# Patient Record
Sex: Female | Born: 1955 | Race: Black or African American | Hispanic: No | Smoking: Former smoker
Health system: Southern US, Community
[De-identification: ages and names within clinical notes are randomized; demographics above are authoritative.]

## PROBLEM LIST (undated history)

## (undated) DIAGNOSIS — A0472 Enterocolitis due to Clostridium difficile, not specified as recurrent: Secondary | ICD-10-CM

## (undated) DIAGNOSIS — I951 Orthostatic hypotension: Secondary | ICD-10-CM

## (undated) DIAGNOSIS — I340 Nonrheumatic mitral (valve) insufficiency: Secondary | ICD-10-CM

## (undated) DIAGNOSIS — Z86718 Personal history of other venous thrombosis and embolism: Secondary | ICD-10-CM

## (undated) DIAGNOSIS — R05 Cough: Secondary | ICD-10-CM

## (undated) DIAGNOSIS — G629 Polyneuropathy, unspecified: Secondary | ICD-10-CM

## (undated) DIAGNOSIS — Z853 Personal history of malignant neoplasm of breast: Secondary | ICD-10-CM

## (undated) DIAGNOSIS — N200 Calculus of kidney: Secondary | ICD-10-CM

## (undated) DIAGNOSIS — D638 Anemia in other chronic diseases classified elsewhere: Secondary | ICD-10-CM

## (undated) DIAGNOSIS — C801 Malignant (primary) neoplasm, unspecified: Secondary | ICD-10-CM

## (undated) DIAGNOSIS — E46 Unspecified protein-calorie malnutrition: Secondary | ICD-10-CM

## (undated) DIAGNOSIS — M62838 Other muscle spasm: Secondary | ICD-10-CM

## (undated) DIAGNOSIS — F329 Major depressive disorder, single episode, unspecified: Secondary | ICD-10-CM

## (undated) DIAGNOSIS — I428 Other cardiomyopathies: Secondary | ICD-10-CM

## (undated) DIAGNOSIS — F32A Depression, unspecified: Secondary | ICD-10-CM

## (undated) DIAGNOSIS — R053 Chronic cough: Secondary | ICD-10-CM

## (undated) DIAGNOSIS — Z9884 Bariatric surgery status: Secondary | ICD-10-CM

## (undated) DIAGNOSIS — M797 Fibromyalgia: Secondary | ICD-10-CM

## (undated) DIAGNOSIS — I480 Paroxysmal atrial fibrillation: Secondary | ICD-10-CM

## (undated) DIAGNOSIS — I5022 Chronic systolic (congestive) heart failure: Secondary | ICD-10-CM

## (undated) HISTORY — DX: Fibromyalgia: M79.7

## (undated) HISTORY — DX: Bariatric surgery status: Z98.84

## (undated) HISTORY — DX: Orthostatic hypotension: I95.1

## (undated) HISTORY — DX: Depression, unspecified: F32.A

## (undated) HISTORY — DX: Other cardiomyopathies: I42.8

## (undated) HISTORY — DX: Other muscle spasm: M62.838

## (undated) HISTORY — PX: BREAST LUMPECTOMY: SHX2

## (undated) HISTORY — DX: Chronic cough: R05.3

## (undated) HISTORY — DX: Enterocolitis due to Clostridium difficile, not specified as recurrent: A04.72

## (undated) HISTORY — DX: Nonrheumatic mitral (valve) insufficiency: I34.0

## (undated) HISTORY — DX: Major depressive disorder, single episode, unspecified: F32.9

## (undated) HISTORY — DX: Unspecified protein-calorie malnutrition: E46

## (undated) HISTORY — DX: Personal history of other venous thrombosis and embolism: Z86.718

## (undated) HISTORY — DX: Calculus of kidney: N20.0

## (undated) HISTORY — DX: Personal history of malignant neoplasm of breast: Z85.3

## (undated) HISTORY — DX: Anemia in other chronic diseases classified elsewhere: D63.8

## (undated) HISTORY — DX: Polyneuropathy, unspecified: G62.9

## (undated) HISTORY — PX: ABDOMINAL HYSTERECTOMY: SHX81

## (undated) HISTORY — DX: Cough: R05

---

## 2011-10-08 DIAGNOSIS — A0472 Enterocolitis due to Clostridium difficile, not specified as recurrent: Secondary | ICD-10-CM

## 2011-10-08 HISTORY — DX: Enterocolitis due to Clostridium difficile, not specified as recurrent: A04.72

## 2011-10-22 ENCOUNTER — Inpatient Hospital Stay: Payer: Self-pay | Admitting: Internal Medicine

## 2011-10-22 DIAGNOSIS — I4891 Unspecified atrial fibrillation: Secondary | ICD-10-CM

## 2011-10-23 DIAGNOSIS — I059 Rheumatic mitral valve disease, unspecified: Secondary | ICD-10-CM

## 2011-11-08 DIAGNOSIS — N200 Calculus of kidney: Secondary | ICD-10-CM

## 2011-11-08 HISTORY — PX: LITHOTRIPSY: SUR834

## 2011-11-08 HISTORY — DX: Calculus of kidney: N20.0

## 2011-11-11 ENCOUNTER — Encounter: Payer: Self-pay | Admitting: Cardiovascular Disease

## 2011-11-11 ENCOUNTER — Ambulatory Visit (INDEPENDENT_AMBULATORY_CARE_PROVIDER_SITE_OTHER): Payer: Medicare Other | Admitting: Cardiovascular Disease

## 2011-11-11 DIAGNOSIS — R079 Chest pain, unspecified: Secondary | ICD-10-CM | POA: Insufficient documentation

## 2011-11-11 DIAGNOSIS — I428 Other cardiomyopathies: Secondary | ICD-10-CM

## 2011-11-11 DIAGNOSIS — I429 Cardiomyopathy, unspecified: Secondary | ICD-10-CM | POA: Insufficient documentation

## 2011-11-11 DIAGNOSIS — R55 Syncope and collapse: Secondary | ICD-10-CM | POA: Insufficient documentation

## 2011-11-11 DIAGNOSIS — I959 Hypotension, unspecified: Secondary | ICD-10-CM

## 2011-11-11 DIAGNOSIS — I4891 Unspecified atrial fibrillation: Secondary | ICD-10-CM | POA: Insufficient documentation

## 2011-11-11 MED ORDER — FLUDROCORTISONE ACETATE 0.1 MG PO TABS: 0.1000 mg | ORAL_TABLET | Freq: Every day | ORAL | Status: DC

## 2011-11-11 NOTE — Patient Instructions (Signed)
You are doing well. Stop the enalapril Continue the coreg twice a day Start the florinef one a day  Please call us if you have new issues that need to be addressed before your next appt.  Your physician wants you to follow-up in: 1 months.  You will receive a reminder letter in the mail two months in advance. If you don't receive a letter, please call our office to schedule the follow-up appointment.

## 2011-11-11 NOTE — Assessment & Plan Note (Signed)
She continues to have orthostatic hypotension, likely from previous chemotherapy though unable to exclude chronic C. Difficile infection. We have encouraged her to continue p.o. Fluids, high salt intake. We will add Florinef 0.1 mg daily. This can be titrated up slowly for low blood pressure. We will hold her enalapril and continue Coreg 3.125 mg b.i.d. Given her recent atrial fibrillation.

## 2011-11-11 NOTE — Assessment & Plan Note (Signed)
Recent episodes of syncope on New Year's Day. This is likely secondary to very low blood pressure. We'll start Florinef as above.

## 2011-11-11 NOTE — Assessment & Plan Note (Signed)
Episode of atrial fibrillation in the hospital but converted to normal sinus rhythm with medical management.  Currently maintaining normal sinus rhythm. We will continue low-dose carvedilol

## 2011-11-11 NOTE — Assessment & Plan Note (Signed)
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 We'll continue the Coreg, hold the ACE inhibitor secondary to low blood pressure. ACE Inhibitor could be restarted once blood pressure has improved on Florinef.

## 2011-11-11 NOTE — Progress Notes (Signed)
Patient ID: Lindsay [name], female    DOB: 02/21/1956, 56 y.o.   MRN: [id number]  HPI Comments: Lindsay [name] is a 56 year old woman, patient of Dr. 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 She was admitted on December 15, treated with flecainide and converted to normal sinus rhythm also treated with diltiazem infusion. She was noted to have low blood pressure and was given IV fluids. Further history details chronic orthostatic hypotension possibly secondary to her chemotherapy though she has had C. Difficile. She was treated for the C. Difficile on this admission.  Echocardiogram showed ejection fraction 40-45% with mild systolic and diastolic dysfunction, normal right ventricular systolic pressures. It was felt that her cardiomyopathy could be secondary to chemotherapy.  She was doing well but did have several additional episodes of syncope on New Year's Day. This happened while she was walking to the bathroom  She has been trying to drink more fluids. She does have chronic fatigue. She does not check her blood pressures at home. She continues to have nausea and diarrhea despite treatment for her C. Difficile.  EKG shows normal sinus rhythm with rate 75 beats per minute with diffuse T-wave abnormality in the anterior precordial leads   Outpatient Encounter Prescriptions as of 11/11/2011  Medication Sig Dispense Refill  . acetaminophen (TYLENOL ARTHRITIS PAIN) 650 MG CR tablet Take 650 mg by mouth every 8 (eight) hours as needed.        Marland Kitchen anastrozole (ARIMIDEX) 1 MG tablet Take 1 mg by mouth daily.        Marland Kitchen aspirin 81 MG tablet Take 160 mg by mouth daily.        . baclofen (LIORESAL) 10 MG tablet Take 10 mg by mouth 3 (three) times daily.        . carvedilol (COREG) 3.125 MG tablet Take 3.125 mg by mouth once a  day      . citalopram (CELEXA) 20 MG tablet Take 20 mg by mouth daily.        . cyclobenzaprine (FLEXERIL) 10 MG tablet Take 10 mg by mouth as needed.        . desloratadine (CLARINEX) 5 MG tablet Take 5 mg by mouth daily.        . ferrous sulfate 325 (65 FE) MG tablet Take 325 mg by mouth daily with breakfast.        . fluticasone (FLONASE) 50 MCG/ACT nasal spray Place 2 sprays into the nose daily.        Marland Kitchen gabapentin (NEURONTIN) 600 MG tablet Take 600 mg by mouth 3 (three) times daily.        Marland Kitchen guaiFENesin (ROBITUSSIN) 100 MG/5ML liquid Take 200 mg by mouth 3 (three) times daily as needed.        Marland Kitchen LOPERAMIDE HCL PO Take by mouth.        . loratadine (CLARITIN) 10 MG tablet Take 10 mg by mouth daily.        Marland Kitchen LORazepam (ATIVAN) 0.5 MG tablet Take 0.5 mg by mouth every 8 (eight) hours.        . metroNIDAZOLE (FLAGYL) 500 MG tablet Take 500 mg by mouth 2 (two) times daily.        . mirtazapine (REMERON) 15 MG tablet Take 15 mg by mouth at bedtime.        Marland Kitchen OLANZapine (ZYPREXA)  2.5 MG tablet Take 2.5 mg by mouth at bedtime.        Marland Kitchen omeprazole (PRILOSEC) 20 MG capsule Take 20 mg by mouth daily.        Marland Kitchen oxyCODONE-acetaminophen (PERCOCET) 5-325 MG per tablet Take 1 tablet by mouth every 6 (six) hours as needed.        . promethazine (PHENERGAN) 12.5 MG tablet Take 12.5 mg by mouth every 6 (six) hours as needed.        Marland Kitchen SIMETHICONE-80 PO Take 80 mg by mouth 4 (four) times daily.        . vitamin B-12 (CYANOCOBALAMIN) 1000 MCG tablet Take 1,000 mcg by mouth once a week.        . vitamin C (ASCORBIC ACID) 250 MG tablet Take 250 mg by mouth daily.        .  enalapril (VASOTEC) 2.5 MG tablet Take 2.5 mg by mouth daily.           Review of Systems  Constitutional: Positive for fatigue.  HENT: Negative.   Eyes: Negative.   Respiratory: Negative.   Cardiovascular: Negative.   Gastrointestinal: Positive for nausea, abdominal pain and diarrhea.  Musculoskeletal: Negative.   Skin: Negative.     Neurological: Positive for dizziness and syncope.  Hematological: Negative.   Psychiatric/Behavioral: Negative.   All other systems reviewed and are negative.    BP 93/61  Pulse 74  Ht 5\' 8"  (1.727 m)  Wt 138 lb (62.596 kg)  BMI 20.98 kg/m2 Blood pressure lying down was 108/75, rate of 80, sitting up was 73/38 with a rate of 82, standing was 66/41 with rate of 87, blood pressure after standing 5 minutes was 77/51 with heart rate of 90  Physical Exam  Nursing note and vitals reviewed. Constitutional: She is oriented to person, place, and time. She appears well-developed and well-nourished.  HENT:  Head: Normocephalic.  Nose: Nose normal.  Mouth/Throat: Oropharynx is clear and moist.  Eyes: Conjunctivae are normal. Pupils are equal, round, and reactive to light.  Neck: Normal range of motion. Neck supple. No JVD present.  Cardiovascular: Normal rate, regular rhythm, S1 normal, S2 normal, normal heart sounds and intact distal pulses.  Exam reveals no gallop and no friction rub.   No murmur heard. Pulmonary/Chest: Effort normal and breath sounds normal. No respiratory distress. She has no wheezes. She has no rales. She exhibits no tenderness.  Abdominal: Soft. Bowel sounds are normal. She exhibits no distension. There is no tenderness.  Musculoskeletal: Normal range of motion. She exhibits no edema and no tenderness.  Lymphadenopathy:    She has no cervical adenopathy.  Neurological: She is alert and oriented to person, place, and time. Coordination normal.  Skin: Skin is warm and dry. No rash noted. No erythema.  Psychiatric: She has a normal mood and affect. Her behavior is normal. Judgment and thought content normal.         Assessment and Plan

## 2011-11-15 ENCOUNTER — Emergency Department: Payer: Self-pay | Admitting: Emergency Medicine

## 2011-11-15 LAB — COMPREHENSIVE METABOLIC PANEL
Albumin: 3.4 g/dL (ref 3.4–5.0)
Anion Gap: 8 (ref 7–16)
BUN: 12 mg/dL (ref 7–18)
Bilirubin,Total: 0.3 mg/dL (ref 0.2–1.0)
Calcium, Total: 9.3 mg/dL (ref 8.5–10.1)
Chloride: 111 mmol/L — ABNORMAL HIGH (ref 98–107)
EGFR (African American): >60
Glucose: 90 mg/dL (ref 65–99)
Osmolality: 290 (ref 275–301)
Potassium: 3.8 mmol/L (ref 3.5–5.1)
Sodium: 146 mmol/L — ABNORMAL HIGH (ref 136–145)
Total Protein: 7 g/dL (ref 6.4–8.2)

## 2011-11-15 LAB — CBC
HCT: 37 % (ref 35.0–47.0)
HGB: 12 g/dL (ref 12.0–16.0)
MCH: 29.6 pg (ref 26.0–34.0)
MCHC: 32.3 g/dL (ref 32.0–36.0)
RBC: 4.04 10*6/uL (ref 3.80–5.20)
WBC: 6.9 10*3/uL (ref 3.6–11.0)

## 2011-11-15 LAB — TROPONIN I: Troponin-I: <0.02 ng/mL

## 2011-12-02 LAB — CBC
HCT: 32.2 % — ABNORMAL LOW (ref 35.0–47.0)
HGB: 10.5 g/dL — ABNORMAL LOW (ref 12.0–16.0)
MCHC: 32.7 g/dL (ref 32.0–36.0)
Platelet: 203 10*3/uL (ref 150–440)

## 2011-12-02 LAB — COMPREHENSIVE METABOLIC PANEL
Alkaline Phosphatase: 64 U/L (ref 50–136)
BUN: 10 mg/dL (ref 7–18)
Bilirubin,Total: 0.2 mg/dL (ref 0.2–1.0)
Co2: 27 mmol/L (ref 21–32)
Creatinine: 0.82 mg/dL (ref 0.60–1.30)
EGFR (African American): >60
EGFR (Non-African Amer.): >60
Osmolality: 289 (ref 275–301)
Potassium: 3.2 mmol/L — ABNORMAL LOW (ref 3.5–5.1)
SGOT(AST): 13 U/L — ABNORMAL LOW (ref 15–37)
SGPT (ALT): 8 U/L — ABNORMAL LOW
Sodium: 146 mmol/L — ABNORMAL HIGH (ref 136–145)
Total Protein: 5.9 g/dL — ABNORMAL LOW (ref 6.4–8.2)

## 2011-12-02 LAB — URINALYSIS, COMPLETE
Bilirubin,UR: NEGATIVE
Glucose,UR: NEGATIVE mg/dL (ref 0–75)
Ketone: NEGATIVE
RBC,UR: 43 /HPF (ref 0–5)
Squamous Epithelial: <1

## 2011-12-02 LAB — TROPONIN I: Troponin-I: 0.02 ng/mL

## 2011-12-03 LAB — BASIC METABOLIC PANEL
Anion Gap: 10 (ref 7–16)
Calcium, Total: 7.7 mg/dL — ABNORMAL LOW (ref 8.5–10.1)
Chloride: 114 mmol/L — ABNORMAL HIGH (ref 98–107)
Co2: 25 mmol/L (ref 21–32)
EGFR (African American): >60
EGFR (Non-African Amer.): >60
Osmolality: 294 (ref 275–301)
Sodium: 149 mmol/L — ABNORMAL HIGH (ref 136–145)

## 2011-12-03 LAB — MAGNESIUM: Magnesium: 1.6 mg/dL — ABNORMAL LOW

## 2011-12-03 LAB — WBCS, STOOL

## 2011-12-04 ENCOUNTER — Inpatient Hospital Stay: Payer: Self-pay | Admitting: Internal Medicine

## 2011-12-04 LAB — CBC WITH DIFFERENTIAL/PLATELET
Basophil #: 0 10*3/uL (ref 0.0–0.1)
Basophil %: 0.1 %
HCT: 30.9 % — ABNORMAL LOW (ref 35.0–47.0)
HGB: 9.9 g/dL — ABNORMAL LOW (ref 12.0–16.0)
Lymphocyte #: 1.9 10*3/uL (ref 1.0–3.6)
Lymphocyte %: 31.8 %
MCV: 91 fL (ref 80–100)
Monocyte %: 7.7 %
Neutrophil #: 3.6 10*3/uL (ref 1.4–6.5)
Neutrophil %: 59 %
RBC: 3.39 10*6/uL — ABNORMAL LOW (ref 3.80–5.20)
RDW: 14.4 % (ref 11.5–14.5)

## 2011-12-04 LAB — BASIC METABOLIC PANEL
BUN: 6 mg/dL — ABNORMAL LOW (ref 7–18)
Calcium, Total: 8.2 mg/dL — ABNORMAL LOW (ref 8.5–10.1)
Co2: 22 mmol/L (ref 21–32)
EGFR (African American): >60
Glucose: 112 mg/dL — ABNORMAL HIGH (ref 65–99)
Osmolality: 293 (ref 275–301)
Potassium: 3.6 mmol/L (ref 3.5–5.1)
Sodium: 148 mmol/L — ABNORMAL HIGH (ref 136–145)

## 2011-12-05 ENCOUNTER — Encounter: Payer: Self-pay | Admitting: Cardiovascular Disease

## 2011-12-05 LAB — BASIC METABOLIC PANEL
Calcium, Total: 8.5 mg/dL (ref 8.5–10.1)
Co2: 24 mmol/L (ref 21–32)
Creatinine: 0.86 mg/dL (ref 0.60–1.30)
EGFR (African American): >60
EGFR (Non-African Amer.): >60
Glucose: 127 mg/dL — ABNORMAL HIGH (ref 65–99)
Osmolality: 289 (ref 275–301)
Potassium: 4 mmol/L (ref 3.5–5.1)

## 2011-12-05 LAB — CBC WITH DIFFERENTIAL/PLATELET
Basophil #: 0 10*3/uL (ref 0.0–0.1)
Eosinophil #: 0.1 10*3/uL (ref 0.0–0.7)
Eosinophil %: 1.1 %
HCT: 30.7 % — ABNORMAL LOW (ref 35.0–47.0)
HGB: 10.1 g/dL — ABNORMAL LOW (ref 12.0–16.0)
Lymphocyte %: 31.5 %
MCH: 29.9 pg (ref 26.0–34.0)
MCV: 91 fL (ref 80–100)
Monocyte %: 8.1 %
Neutrophil #: 3.4 10*3/uL (ref 1.4–6.5)
Neutrophil %: 59.2 %
Platelet: 184 10*3/uL (ref 150–440)
RBC: 3.39 10*6/uL — ABNORMAL LOW (ref 3.80–5.20)
RDW: 14.2 % (ref 11.5–14.5)

## 2011-12-05 LAB — STOOL CULTURE

## 2011-12-06 LAB — BASIC METABOLIC PANEL
Anion Gap: 10 (ref 7–16)
BUN: 3 mg/dL — ABNORMAL LOW (ref 7–18)
Calcium, Total: 8.6 mg/dL (ref 8.5–10.1)
Chloride: 105 mmol/L (ref 98–107)
Co2: 25 mmol/L (ref 21–32)
EGFR (Non-African Amer.): >60
Potassium: 4.1 mmol/L (ref 3.5–5.1)
Sodium: 140 mmol/L (ref 136–145)

## 2011-12-07 LAB — BASIC METABOLIC PANEL
Calcium, Total: 8.7 mg/dL (ref 8.5–10.1)
Co2: 26 mmol/L (ref 21–32)
EGFR (African American): >60
Glucose: 94 mg/dL (ref 65–99)
Osmolality: 278 (ref 275–301)
Potassium: 4.2 mmol/L (ref 3.5–5.1)
Sodium: 141 mmol/L (ref 136–145)

## 2011-12-08 ENCOUNTER — Encounter: Payer: Self-pay | Admitting: Cardiovascular Disease

## 2011-12-08 LAB — CBC WITH DIFFERENTIAL/PLATELET
Basophil %: 0.1 %
Eosinophil %: 1.5 %
HCT: 31.1 % — ABNORMAL LOW (ref 35.0–47.0)
Lymphocyte #: 1.7 10*3/uL (ref 1.0–3.6)
Lymphocyte %: 30.6 %
MCV: 91 fL (ref 80–100)
Monocyte %: 6.3 %
Neutrophil #: 3.3 10*3/uL (ref 1.4–6.5)
Platelet: 203 10*3/uL (ref 150–440)
RBC: 3.41 10*6/uL — ABNORMAL LOW (ref 3.80–5.20)
WBC: 5.4 10*3/uL (ref 3.6–11.0)

## 2011-12-08 LAB — CLOSTRIDIUM DIFFICILE BY PCR

## 2011-12-09 ENCOUNTER — Encounter: Payer: Self-pay | Admitting: Cardiovascular Disease

## 2011-12-09 LAB — BASIC METABOLIC PANEL
Anion Gap: 10 (ref 7–16)
BUN: 8 mg/dL (ref 7–18)
Calcium, Total: 8.4 mg/dL — ABNORMAL LOW (ref 8.5–10.1)
Chloride: 108 mmol/L — ABNORMAL HIGH (ref 98–107)
Co2: 27 mmol/L (ref 21–32)
EGFR (African American): >60
EGFR (Non-African Amer.): >60
Glucose: 80 mg/dL (ref 65–99)
Osmolality: 286 (ref 275–301)
Potassium: 3.8 mmol/L (ref 3.5–5.1)

## 2011-12-12 ENCOUNTER — Ambulatory Visit (INDEPENDENT_AMBULATORY_CARE_PROVIDER_SITE_OTHER): Payer: Medicare Other | Admitting: Cardiovascular Disease

## 2011-12-12 ENCOUNTER — Encounter: Payer: Self-pay | Admitting: Cardiovascular Disease

## 2011-12-12 DIAGNOSIS — A0472 Enterocolitis due to Clostridium difficile, not specified as recurrent: Secondary | ICD-10-CM

## 2011-12-12 DIAGNOSIS — I4891 Unspecified atrial fibrillation: Secondary | ICD-10-CM

## 2011-12-12 DIAGNOSIS — I428 Other cardiomyopathies: Secondary | ICD-10-CM

## 2011-12-12 DIAGNOSIS — I959 Hypotension, unspecified: Secondary | ICD-10-CM

## 2011-12-12 NOTE — Assessment & Plan Note (Signed)
Mildly depressed EF in the past.  We will continue coreg. Restart ACE when her BP improves.

## 2011-12-12 NOTE — Progress Notes (Signed)
Patient ID: Lindsay [name], female    DOB: 03/01/1956, 56 y.o.   MRN: [id number]  HPI Comments: Lindsay [name] is a 56 year old woman, patient of Dr. 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 She was admitted on October 22 2011, treated with flecainide and converted to normal sinus rhythm also treated with diltiazem infusion. She was noted to have low blood pressure and was given IV fluids. Diarrhea x 2 years. She has chronic orthostatic hypotension possibly secondary to her chemotherapy though she has had C. Difficile. She was treated for the C. Difficile in 10/2011 with flagyl.  Echocardiogram showed ejection fraction 40-45% with mild systolic and diastolic dysfunction, normal right ventricular systolic pressures. It was felt that her cardiomyopathy could be secondary to chemotherapy.  She was doing well but did have several additional episodes of syncope on New Year's Day. This happened while she was walking to the bathroom We saw her in clinic and held her ACE inhibitor, started Florinef 0.1 mg daily. She has not had any syncope since then though was admitted to the hospital a week ago with near syncope, hypotension, kidney stones, requiring removal of a kidney stone, started on vancomycin for C. Difficile colitis.  She reports that she continues to have diarrhea and has difficulty maintaining enough po fluid intake.       Outpatient Encounter Prescriptions as of 12/12/2011  Medication Sig Dispense Refill  . acetaminophen (TYLENOL ARTHRITIS PAIN) 650 MG CR tablet Take 650 mg by mouth every 8 (eight) hours as needed.        Marland Kitchen anastrozole (ARIMIDEX) 1 MG tablet Take 1 mg by mouth daily.        Marland Kitchen aspirin 81 MG tablet Take 81 mg by mouth  daily.       Marland Kitchen azelastine (ASTELIN) 137 MCG/SPRAY nasal spray Place 1 spray into the nose 2 (two) times daily. Use in each nostril as directed      . baclofen (LIORESAL) 10 MG tablet Take 5 mg by mouth 3 (three) times daily.       . carvedilol (COREG) 3.125 MG tablet Take 3.125 mg by mouth 2 (two) times daily with a meal.       . citalopram (CELEXA) 20 MG tablet Take 20 mg by mouth daily.        . cyclobenzaprine (FLEXERIL) 10 MG tablet Take 10 mg by mouth as needed.        . desloratadine (CLARINEX) 5 MG tablet Take 5 mg by mouth daily.        . ferrous sulfate 325 (65 FE) MG tablet Take 325 mg by mouth daily with breakfast.        . Flora-Q (FLORA-Q) CAPS Take 1 capsule by mouth 2 (two) times daily.      . fludrocortisone (FLORINEF) 0.1 MG tablet Take 1 tablet (0.1 mg total) by mouth daily.  30 tablet  6  . fluticasone (FLONASE) 50 MCG/ACT nasal spray Place 2 sprays into the nose daily.        Marland Kitchen gabapentin (NEURONTIN) 600 MG tablet Take 600 mg by mouth 3 (three) times daily.        Marland Kitchen guaiFENesin (ROBITUSSIN) 100 MG/5ML liquid Take 200 mg by mouth 3 (three) times daily as needed.        Marland Kitchen  loratadine (CLARITIN) 10 MG tablet Take 10 mg by mouth daily.        Marland Kitchen LORazepam (ATIVAN) 0.5 MG tablet Take 0.5 mg by mouth every 8 (eight) hours.        . Meth-Hyo-M Bl-Na Phos-Ph Sal (URIBEL PO) Take 1 capsule by mouth as needed.      . mirtazapine (REMERON) 15 MG tablet Take 15 mg by mouth at bedtime.        Marland Kitchen OLANZapine (ZYPREXA) 2.5 MG tablet Take 2.5 mg by mouth at bedtime.        Marland Kitchen omeprazole (PRILOSEC) 20 MG capsule Take 20 mg by mouth daily.        Marland Kitchen oxyCODONE-acetaminophen (PERCOCET) 5-325 MG per tablet Take 1 tablet by mouth every 6 (six) hours as needed.        . promethazine (PHENERGAN) 12.5 MG tablet Take 12.5 mg by mouth every 6 (six) hours as needed.        Marland Kitchen SIMETHICONE-80 PO Take 80 mg by mouth 3 (three) times daily as needed.       . vancomycin (VANCOCIN) 125 MG capsule Take 125 mg by  mouth 4 (four) times daily.      . vitamin B-12 (CYANOCOBALAMIN) 1000 MCG tablet Take 1,000 mcg by mouth once a week.        . vitamin C (ASCORBIC ACID) 250 MG tablet Take 250 mg by mouth daily.        Marland Kitchen LOPERAMIDE HCL PO Take by mouth.        . metroNIDAZOLE (FLAGYL) 500 MG tablet Take 500 mg by mouth 2 (two) times daily.            Review of Systems  Constitutional: Positive for fatigue.  HENT: Negative.   Eyes: Negative.   Respiratory: Negative.   Cardiovascular: Negative.   Gastrointestinal: Positive for diarrhea.  Musculoskeletal: Negative.   Skin: Negative.   Neurological: Positive for dizziness and light-headedness.  Hematological: Negative.   Psychiatric/Behavioral: Negative.   All other systems reviewed and are negative.    BP 93/67  Pulse 86  Ht 5\' 8"  (1.727 m)  Wt 132 lb (59.875 kg)  BMI 20.07 kg/m2  Physical Exam  Nursing note and vitals reviewed. Constitutional: She is oriented to person, place, and time.       Very thin. In electric scooter  HENT:  Head: Normocephalic.  Nose: Nose normal.  Mouth/Throat: Oropharynx is clear and moist.  Eyes: Conjunctivae are normal. Pupils are equal, round, and reactive to light.  Neck: Normal range of motion. Neck supple. No JVD present.  Cardiovascular: Normal rate, regular rhythm, S1 normal, S2 normal, normal heart sounds and intact distal pulses.  Exam reveals no gallop and no friction rub.   No murmur heard. Pulmonary/Chest: Effort normal and breath sounds normal. No respiratory distress. She has no wheezes. She has no rales. She exhibits no tenderness.  Abdominal: Soft. Bowel sounds are normal. She exhibits no distension. There is no tenderness.  Musculoskeletal: Normal range of motion. She exhibits no edema and no tenderness.  Lymphadenopathy:    She has no cervical adenopathy.  Neurological: She is alert and oriented to person, place, and time. Coordination normal.  Skin: Skin is warm and dry. No rash noted. No  erythema.  Psychiatric: She has a normal mood and affect. Her behavior is normal. Judgment and thought content normal.         Assessment and Plan

## 2011-12-12 NOTE — Assessment & Plan Note (Signed)
NSR on exam today. Continue coreg.

## 2011-12-12 NOTE — Patient Instructions (Signed)
You are doing well. No medication changes were made. Hold the enalapril at home  Please call us if you have new issues that need to be addressed before your next appt.  Your physician wants you to follow-up in: 1 months.  You will receive a reminder letter in the mail two months in advance. If you don't receive a letter, please call our office to schedule the follow-up appointment.

## 2011-12-12 NOTE — Assessment & Plan Note (Signed)
We'll continue her on Florinef, hold her ACE inhibitor.  We'll continue the carvedilol for now given history of atrial fibrillation. She is on vancomycin and if this helps her colitis, this should help improve her fluid hydration.

## 2011-12-12 NOTE — Assessment & Plan Note (Signed)
She is being treated with vancomycin for 2 weeks. Followup with Dr. Bluford Kaufmann.

## 2012-01-09 ENCOUNTER — Encounter: Payer: Self-pay | Admitting: Cardiovascular Disease

## 2012-01-09 ENCOUNTER — Ambulatory Visit (INDEPENDENT_AMBULATORY_CARE_PROVIDER_SITE_OTHER): Payer: Medicare Other | Admitting: Cardiovascular Disease

## 2012-01-09 DIAGNOSIS — R55 Syncope and collapse: Secondary | ICD-10-CM

## 2012-01-09 DIAGNOSIS — I428 Other cardiomyopathies: Secondary | ICD-10-CM

## 2012-01-09 DIAGNOSIS — I959 Hypotension, unspecified: Secondary | ICD-10-CM

## 2012-01-09 DIAGNOSIS — I4891 Unspecified atrial fibrillation: Secondary | ICD-10-CM

## 2012-01-09 DIAGNOSIS — A0472 Enterocolitis due to Clostridium difficile, not specified as recurrent: Secondary | ICD-10-CM

## 2012-01-09 NOTE — Assessment & Plan Note (Signed)
Blood pressure continues to run low. Currently asymptomatic. Encouraged high salt and fluid intake. If she has more symptoms, Florinef could be increased. TED hose if she has more symptoms.

## 2012-01-09 NOTE — Assessment & Plan Note (Signed)
Diarrhea seems to have improved after vancomycin. This will likely help her Hydration status.

## 2012-01-09 NOTE — Assessment & Plan Note (Signed)
Previous echocardiogram showing depressed ejection fraction, possibly secondary to chemotherapy. Unable to advance her medication management secondary to low blood pressure.

## 2012-01-09 NOTE — Assessment & Plan Note (Signed)
Maintaining normal sinus rhythm today. No changes to her medications. 

## 2012-01-09 NOTE — Progress Notes (Signed)
Patient ID: Lindsay [name], female    DOB: [birth year], 56 y.o.   MRN: [id number]  HPI Comments: Lindsay [name] is a 56 year old woman, patient of Dr. 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 She was admitted on October 22 2011, treated with flecainide and converted to normal sinus rhythm also treated with diltiazem infusion. She was noted to have low blood pressure and was given IV fluids. Diarrhea x 2 years. She has chronic orthostatic hypotension possibly secondary to her chemotherapy though she has had C. Difficile. She was treated for the C. Difficile in 10/2011 with flagyl.  She was doing well but did have several additional episodes of syncope on New Year's Day. This happened while she was walking to the bathroom We saw her in clinic and held her ACE inhibitor, started Florinef 0.1 mg daily. She has not had any syncope since then though was admitted to the hospital  with near syncope, hypotension, kidney stones, requiring removal of a kidney stone, started on vancomycin for C. Difficile colitis.  Today she reports that she is doing well. She is on Florinef 0.1 mg daily, diarrhea is improved, no significant lightheadedness or dizziness. No syncope. She is starting to walk more with her walker but continues to use a wheelchair. Overall she feels well.   Echocardiogram showed ejection fraction 40-45% with mild systolic and diastolic dysfunction, normal right ventricular systolic pressures. It was felt that her cardiomyopathy could be secondary to chemotherapy.     Outpatient Encounter Prescriptions as of 01/09/2012  Medication Sig Dispense Refill  . acetaminophen (TYLENOL ARTHRITIS PAIN) 650 MG CR tablet Take 650 mg by mouth every 8  (eight) hours as needed.        Marland Kitchen anastrozole (ARIMIDEX) 1 MG tablet Take 1 mg by mouth daily.        Marland Kitchen aspirin 81 MG tablet Take 81 mg by mouth daily.       Marland Kitchen azelastine (ASTELIN) 137 MCG/SPRAY nasal spray Place 1 spray into the nose 2 (two) times daily. Use in each nostril as directed      . baclofen (LIORESAL) 10 MG tablet Take 5 mg by mouth 3 (three) times daily.       . carvedilol (COREG) 3.125 MG tablet Take 3.125 mg by mouth 2 (two) times daily with a meal.       . citalopram (CELEXA) 20 MG tablet Take 20 mg by mouth daily.        . cyclobenzaprine (FLEXERIL) 10 MG tablet Take 10 mg by mouth as needed.        . desloratadine (CLARINEX) 5 MG tablet Take 5 mg by mouth daily.        . ferrous sulfate 325 (65 FE) MG tablet Take 325 mg by mouth daily with breakfast.        . fludrocortisone (FLORINEF) 0.1 MG tablet Take 1 tablet (0.1 mg total) by mouth daily.  30 tablet  6  . fluticasone (FLONASE) 50 MCG/ACT nasal spray Place 2 sprays into the nose daily.        Marland Kitchen gabapentin (NEURONTIN) 600 MG tablet Take 600 mg by mouth 3 (three) times daily.        Marland Kitchen guaiFENesin (ROBITUSSIN) 100 MG/5ML liquid Take 200 mg by mouth 3 (three)  times daily as needed.        Marland Kitchen LOPERAMIDE HCL PO Take by mouth.        . loratadine (CLARITIN) 10 MG tablet Take 10 mg by mouth daily.        Marland Kitchen LORazepam (ATIVAN) 0.5 MG tablet Take 0.5 mg by mouth every 8 (eight) hours.        . mirtazapine (REMERON) 15 MG tablet Take 15 mg by mouth at bedtime.        Marland Kitchen OLANZapine (ZYPREXA) 2.5 MG tablet Take 2.5 mg by mouth at bedtime.        Marland Kitchen omeprazole (PRILOSEC) 20 MG capsule Take 20 mg by mouth daily.        Marland Kitchen oxyCODONE-acetaminophen (PERCOCET) 5-325 MG per tablet Take 1 tablet by mouth every 6 (six) hours as needed.        . promethazine (PHENERGAN) 12.5 MG tablet Take 12.5 mg by mouth every 6 (six) hours as needed.        Marland Kitchen SIMETHICONE-80 PO Take 80 mg by mouth 3 (three) times daily as needed.       . vitamin B-12  (CYANOCOBALAMIN) 1000 MCG tablet Take 1,000 mcg by mouth once a week.        . vitamin C (ASCORBIC ACID) 250 MG tablet Take 250 mg by mouth daily.        Donald Prose (FLORA-Q) CAPS Take 1 capsule by mouth 2 (two) times daily.      . Meth-Hyo-M Bl-Na Phos-Ph Sal (URIBEL PO) Take 1 capsule by mouth as needed.      Marland Kitchen DISCONTD: metroNIDAZOLE (FLAGYL) 500 MG tablet Take 500 mg by mouth 2 (two) times daily.        Marland Kitchen DISCONTD: vancomycin (VANCOCIN) 125 MG capsule Take 125 mg by mouth 4 (four) times daily.          Review of Systems  HENT: Negative.   Eyes: Negative.   Respiratory: Negative.   Cardiovascular: Negative.   Musculoskeletal: Negative.   Skin: Negative.   Hematological: Negative.   Psychiatric/Behavioral: Negative.   All other systems reviewed and are negative.    BP 95/67  Pulse 65  Ht 5\' 9"  (1.753 m)  Wt 129 lb (58.514 kg)  BMI 19.05 kg/m2  Physical Exam  Nursing note and vitals reviewed. Constitutional: She is oriented to person, place, and time.       Very thin. In electric scooter  HENT:  Head: Normocephalic.  Nose: Nose normal.  Mouth/Throat: Oropharynx is clear and moist.  Eyes: Conjunctivae are normal. Pupils are equal, round, and reactive to light.  Neck: Normal range of motion. Neck supple. No JVD present.  Cardiovascular: Normal rate, regular rhythm, S1 normal, S2 normal, normal heart sounds and intact distal pulses.  Exam reveals no gallop and no friction rub.   No murmur heard. Pulmonary/Chest: Effort normal and breath sounds normal. No respiratory distress. She has no wheezes. She has no rales. She exhibits no tenderness.  Abdominal: Soft. Bowel sounds are normal. She exhibits no distension. There is no tenderness.  Musculoskeletal: Normal range of motion. She exhibits no edema and no tenderness.  Lymphadenopathy:    She has no cervical adenopathy.  Neurological: She is alert and oriented to person, place, and time. Coordination normal.  Skin: Skin is  warm and dry. No rash noted. No erythema.  Psychiatric: She has a normal mood and affect. Her behavior is normal. Judgment and thought content normal.  Assessment and Plan

## 2012-01-09 NOTE — Patient Instructions (Signed)
You are doing well. No medication changes were made.  Please call us if you have new issues that need to be addressed before your next appt.  Your physician wants you to follow-up in: 6 months.  You will receive a reminder letter in the mail two months in advance. If you don't receive a letter, please call our office to schedule the follow-up appointment.   

## 2012-01-09 NOTE — Assessment & Plan Note (Signed)
No further syncope. Previous episodes likely secondary to hypotension. We'll continue Florinef, encouraged fluid intake, high salt intake.

## 2012-05-01 ENCOUNTER — Encounter: Payer: Self-pay | Admitting: Cardiovascular Disease

## 2012-05-07 ENCOUNTER — Other Ambulatory Visit: Payer: Self-pay | Admitting: *Deleted

## 2012-05-07 MED ORDER — FLUDROCORTISONE ACETATE 0.1 MG PO TABS: 0.1000 mg | ORAL_TABLET | Freq: Every day | ORAL | Status: AC

## 2012-05-07 NOTE — Telephone Encounter (Signed)
Refilled Fludrocortisone acet.

## 2012-05-31 ENCOUNTER — Emergency Department: Payer: Self-pay | Admitting: Emergency Medicine

## 2012-05-31 LAB — DRUG SCREEN, URINE
Amphetamines, Ur Screen: NEGATIVE (ref ?–1000)
Barbiturates, Ur Screen: NEGATIVE (ref ?–200)
Cocaine Metabolite,Ur ~~LOC~~: NEGATIVE (ref ?–300)
Methadone, Ur Screen: NEGATIVE (ref ?–300)
Opiate, Ur Screen: POSITIVE (ref ?–300)
Tricyclic, Ur Screen: POSITIVE (ref ?–1000)

## 2012-05-31 LAB — URINALYSIS, COMPLETE
Bacteria: NONE SEEN
Bilirubin,UR: NEGATIVE
Blood: NEGATIVE
Glucose,UR: NEGATIVE mg/dL (ref 0–75)
Ketone: NEGATIVE
Ph: 5 (ref 4.5–8.0)
Protein: NEGATIVE
Specific Gravity: 1.02 (ref 1.003–1.030)

## 2012-05-31 LAB — COMPREHENSIVE METABOLIC PANEL
Albumin: 4.1 g/dL (ref 3.4–5.0)
Alkaline Phosphatase: 127 U/L (ref 50–136)
BUN: 20 mg/dL — ABNORMAL HIGH (ref 7–18)
Calcium, Total: 10 mg/dL (ref 8.5–10.1)
Glucose: 105 mg/dL — ABNORMAL HIGH (ref 65–99)
SGOT(AST): 32 U/L (ref 15–37)
Total Protein: 8.7 g/dL — ABNORMAL HIGH (ref 6.4–8.2)

## 2012-05-31 LAB — TSH: Thyroid Stimulating Horm: 5.31 u[IU]/mL — ABNORMAL HIGH

## 2012-05-31 LAB — CBC
HCT: 36.4 % (ref 35.0–47.0)
Platelet: 213 10*3/uL (ref 150–440)
RBC: 3.88 10*6/uL (ref 3.80–5.20)
RDW: 14.4 % (ref 11.5–14.5)

## 2012-05-31 LAB — ETHANOL: Ethanol %: <0.003 % (ref 0.000–0.080)

## 2012-05-31 LAB — SALICYLATE LEVEL: Salicylates, Serum: 1.9 mg/dL

## 2012-05-31 LAB — ACETAMINOPHEN LEVEL: Acetaminophen: <2 ug/mL

## 2012-07-13 ENCOUNTER — Ambulatory Visit (INDEPENDENT_AMBULATORY_CARE_PROVIDER_SITE_OTHER): Payer: Medicare Other | Admitting: Cardiovascular Disease

## 2012-07-13 ENCOUNTER — Encounter: Payer: Self-pay | Admitting: Cardiovascular Disease

## 2012-07-13 VITALS — BP 80/62 | HR 72 | Ht 66.0 in | Wt 148.0 lb

## 2012-07-13 DIAGNOSIS — R55 Syncope and collapse: Secondary | ICD-10-CM

## 2012-07-13 DIAGNOSIS — I4891 Unspecified atrial fibrillation: Secondary | ICD-10-CM

## 2012-07-13 NOTE — Assessment & Plan Note (Signed)
Maintaining normal sinus rhythm. Continue low-dose carvedilol

## 2012-07-13 NOTE — Patient Instructions (Addendum)
You are doing well. No medication changes were made.  Please call us if you have new issues that need to be addressed before your next appt.  Your physician wants you to follow-up in: 6 months.  You will receive a reminder letter in the mail two months in advance. If you don't receive a letter, please call our office to schedule the follow-up appointment.   

## 2012-07-13 NOTE — Progress Notes (Signed)
Patient ID: Lindsay [name], female    DOB: 1955/12/21, 56 y.o.   MRN: [id number]  HPI Comments: Lindsay [name] is a 56 year old woman, patient of Dr. 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 She was noted to have low blood pressure and was given IV fluids. She has chronic Diarrhea, also with chronic orthostatic hypotension possibly secondary to her chemotherapy though she has had C. Difficile. She was treated for the C. Difficile in 10/2011 with flagyl.  Syncope in early 2012 while walking. We held her ACE inhibitor and started Florinef. She reports that her Florinef was held she does not know the details and this was restarted several days ago. She denies any near-syncope or syncope recently. Her diarrhea has improved and occasionally she has a formed stool.  She continues to walk with a walker. She has been spending time at the beach with her mother. Overall she has no complaints  Echocardiogram showed ejection fraction 40-45% with mild systolic and diastolic dysfunction, normal right ventricular systolic pressures. It was felt that her cardiomyopathy could be secondary to chemotherapy.  EKG shows normal sinus rhythm with rate 72 beats per minute with no significant ST changes     Outpatient Encounter Prescriptions as of 07/13/2012  Medication Sig Dispense Refill  . acetaminophen (TYLENOL ARTHRITIS PAIN) 650 MG CR tablet Take 650 mg by mouth every 8 (eight) hours as needed.        Marland Kitchen anastrozole (ARIMIDEX) 1 MG tablet Take 1 mg by mouth daily.        Marland Kitchen aspirin 81 MG tablet Take 81 mg by mouth daily.       Marland Kitchen  azelastine (ASTELIN) 137 MCG/SPRAY nasal spray Place 1 spray into the nose 2 (two) times daily. Use in each nostril as directed      . baclofen (LIORESAL) 10 MG tablet Take 5 mg by mouth 3 (three) times daily.       . carvedilol (COREG) 3.125 MG tablet Take 3.125 mg by mouth 2 (two) times daily with a meal.       . citalopram (CELEXA) 20 MG tablet Take 20 mg by mouth daily.        . cyclobenzaprine (FLEXERIL) 10 MG tablet Take 10 mg by mouth as needed.        . desloratadine (CLARINEX) 5 MG tablet Take 5 mg by mouth daily.        . ferrous sulfate 325 (65 FE) MG tablet Take 325 mg by mouth daily with breakfast.        . Flora-Q (FLORA-Q) CAPS Take 1 capsule by mouth 2 (two) times daily.      . fludrocortisone (FLORINEF) 0.1 MG tablet Take 1 tablet (0.1 mg total) by mouth daily.  30 tablet  3  . fluticasone (FLONASE) 50 MCG/ACT nasal spray Place 2 sprays into the nose daily.        Marland Kitchen gabapentin (NEURONTIN) 600 MG tablet Take 600 mg by mouth 3 (three) times daily.        Marland Kitchen guaiFENesin (ROBITUSSIN) 100 MG/5ML liquid Take 200 mg by mouth 3 (three) times daily as needed.        Marland Kitchen  LOPERAMIDE HCL PO Take by mouth.        . loratadine (CLARITIN) 10 MG tablet Take 10 mg by mouth daily.        Marland Kitchen LORazepam (ATIVAN) 0.5 MG tablet Take 0.5 mg by mouth every 8 (eight) hours.        . Meth-Hyo-M Bl-Na Phos-Ph Sal (URIBEL PO) Take 1 capsule by mouth as needed.      . mirtazapine (REMERON) 15 MG tablet Take 15 mg by mouth at bedtime.        Marland Kitchen OLANZapine (ZYPREXA) 2.5 MG tablet Take 2.5 mg by mouth at bedtime.        Marland Kitchen omeprazole (PRILOSEC) 20 MG capsule Take 20 mg by mouth daily.        Marland Kitchen oxyCODONE-acetaminophen (PERCOCET) 5-325 MG per tablet Take 1 tablet by mouth every 6 (six) hours as needed.        . promethazine (PHENERGAN) 12.5 MG tablet Take 12.5 mg by mouth every 6 (six) hours as needed.        Marland Kitchen SIMETHICONE-80 PO Take 80 mg by mouth 3 (three) times daily as needed.       . vitamin B-12 (CYANOCOBALAMIN)  1000 MCG tablet Take 1,000 mcg by mouth once a week.        . vitamin C (ASCORBIC ACID) 250 MG tablet Take 250 mg by mouth daily.            Review of Systems  Constitutional: Negative.   HENT: Negative.   Eyes: Negative.   Respiratory: Negative.   Cardiovascular: Negative.   Gastrointestinal: Negative.   Musculoskeletal: Positive for gait problem.  Skin: Negative.   Neurological: Negative.   Hematological: Negative.   Psychiatric/Behavioral: Negative.   All other systems reviewed and are negative.    BP 80/62  Pulse 72  Ht 5\' 6"  (1.676 m)  Wt 148 lb (67.132 kg)  BMI 23.89 kg/m2  Physical Exam  Nursing note and vitals reviewed. Constitutional: She is oriented to person, place, and time. She appears well-developed and well-nourished.       Very thin. Walking with a walker  HENT:  Head: Normocephalic.  Nose: Nose normal.  Mouth/Throat: Oropharynx is clear and moist.  Eyes: Conjunctivae are normal. Pupils are equal, round, and reactive to light.  Neck: Normal range of motion. Neck supple. No JVD present.  Cardiovascular: Normal rate, regular rhythm, S1 normal, S2 normal, normal heart sounds and intact distal pulses.  Exam reveals no gallop and no friction rub.   No murmur heard. Pulmonary/Chest: Effort normal and breath sounds normal. No respiratory distress. She has no wheezes. She has no rales. She exhibits no tenderness.  Abdominal: Soft. Bowel sounds are normal. She exhibits no distension. There is no tenderness.  Musculoskeletal: Normal range of motion. She exhibits no edema and no tenderness.  Lymphadenopathy:    She has no cervical adenopathy.  Neurological: She is alert and oriented to person, place, and time. Coordination normal.  Skin: Skin is warm and dry. No rash noted. No erythema.  Psychiatric: She has a normal mood and affect. Her behavior is normal. Judgment and thought content normal.         Assessment and Plan

## 2012-07-13 NOTE — Assessment & Plan Note (Signed)
No recent syncope or near syncope. We have suggested she stay on Florinef 0.1 mg daily, encouraged fluid and salt intake.

## 2013-03-22 ENCOUNTER — Emergency Department: Payer: Self-pay | Admitting: Emergency Medicine

## 2013-05-18 ENCOUNTER — Emergency Department: Payer: Self-pay | Admitting: Emergency Medicine

## 2013-05-18 LAB — COMPREHENSIVE METABOLIC PANEL
Albumin: 3.3 g/dL — ABNORMAL LOW (ref 3.4–5.0)
Alkaline Phosphatase: 113 U/L (ref 50–136)
BUN: 11 mg/dL (ref 7–18)
Calcium, Total: 8.9 mg/dL (ref 8.5–10.1)
Chloride: 105 mmol/L (ref 98–107)
Co2: 28 mmol/L (ref 21–32)
Creatinine: 0.94 mg/dL (ref 0.60–1.30)
EGFR (African American): >60
Glucose: 88 mg/dL (ref 65–99)
SGOT(AST): 13 U/L — ABNORMAL LOW (ref 15–37)
Sodium: 141 mmol/L (ref 136–145)
Total Protein: 7.1 g/dL (ref 6.4–8.2)

## 2013-05-18 LAB — URINALYSIS, COMPLETE
Protein: NEGATIVE
Squamous Epithelial: 17
WBC UR: 19 /HPF (ref 0–5)

## 2013-05-18 LAB — MAGNESIUM: Magnesium: 1.7 mg/dL — ABNORMAL LOW

## 2013-05-18 LAB — CBC
HCT: 32.6 % — ABNORMAL LOW (ref 35.0–47.0)
HGB: 10.7 g/dL — ABNORMAL LOW (ref 12.0–16.0)
MCV: 87 fL (ref 80–100)
Platelet: 209 10*3/uL (ref 150–440)
RBC: 3.76 10*6/uL — ABNORMAL LOW (ref 3.80–5.20)
WBC: 4.9 10*3/uL (ref 3.6–11.0)

## 2013-05-18 LAB — TROPONIN I
Troponin-I: 0.03 ng/mL
Troponin-I: 0.03 ng/mL

## 2013-05-18 LAB — PRO B NATRIURETIC PEPTIDE: B-Type Natriuretic Peptide: 700 pg/mL — ABNORMAL HIGH (ref 0–125)

## 2013-05-18 LAB — LIPASE, BLOOD: Lipase: 112 U/L (ref 73–393)

## 2013-05-24 ENCOUNTER — Emergency Department: Payer: Self-pay | Admitting: Emergency Medicine

## 2013-05-24 LAB — CBC
MCHC: 32.7 g/dL (ref 32.0–36.0)
Platelet: 228 10*3/uL (ref 150–440)
RDW: 15.1 % — ABNORMAL HIGH (ref 11.5–14.5)
WBC: 5 10*3/uL (ref 3.6–11.0)

## 2013-05-24 LAB — LIPASE, BLOOD: Lipase: 56 U/L — ABNORMAL LOW (ref 73–393)

## 2013-05-24 LAB — PHOSPHORUS: Phosphorus: 2.6 mg/dL (ref 2.5–4.9)

## 2013-05-24 LAB — BASIC METABOLIC PANEL
Anion Gap: 6 — ABNORMAL LOW (ref 7–16)
BUN: 10 mg/dL (ref 7–18)
Calcium, Total: 9.1 mg/dL (ref 8.5–10.1)
Co2: 27 mmol/L (ref 21–32)
EGFR (Non-African Amer.): >60
Glucose: 84 mg/dL (ref 65–99)
Sodium: 140 mmol/L (ref 136–145)

## 2013-06-07 ENCOUNTER — Ambulatory Visit: Payer: Self-pay | Admitting: Internal Medicine

## 2013-06-15 ENCOUNTER — Inpatient Hospital Stay: Payer: Self-pay | Admitting: Internal Medicine

## 2013-06-15 LAB — COMPREHENSIVE METABOLIC PANEL
BUN: 32 mg/dL — ABNORMAL HIGH (ref 7–18)
Co2: 22 mmol/L (ref 21–32)
Creatinine: 2.31 mg/dL — ABNORMAL HIGH (ref 0.60–1.30)
Glucose: 86 mg/dL (ref 65–99)
Potassium: 3.4 mmol/L — ABNORMAL LOW (ref 3.5–5.1)
SGOT(AST): 94 U/L — ABNORMAL HIGH (ref 15–37)
SGPT (ALT): 43 U/L (ref 12–78)
Sodium: 143 mmol/L (ref 136–145)
Total Protein: 6.4 g/dL (ref 6.4–8.2)

## 2013-06-15 LAB — CBC
HCT: 37 % (ref 35.0–47.0)
HGB: 12.5 g/dL (ref 12.0–16.0)
MCH: 28.9 pg (ref 26.0–34.0)
MCHC: 33.8 g/dL (ref 32.0–36.0)

## 2013-06-15 LAB — URINALYSIS, COMPLETE
Ketone: NEGATIVE
Leukocyte Esterase: NEGATIVE
Ph: 6 (ref 4.5–8.0)
Protein: 30
RBC,UR: 1 /HPF (ref 0–5)

## 2013-06-15 LAB — CK TOTAL AND CKMB (NOT AT ARMC)
CK, Total: 64 U/L (ref 21–215)
CK-MB: <0.5 ng/mL — ABNORMAL LOW (ref 0.5–3.6)

## 2013-06-16 LAB — CBC WITH DIFFERENTIAL/PLATELET
Basophil %: 0.1 %
Eosinophil %: 0.3 %
Lymphocyte #: 0.9 10*3/uL — ABNORMAL LOW (ref 1.0–3.6)
Lymphocyte %: 12.4 %
MCH: 28.8 pg (ref 26.0–34.0)
MCV: 85 fL (ref 80–100)
Monocyte #: 0.5 x10 3/mm (ref 0.2–0.9)
Monocyte %: 7.2 %
Neutrophil #: 6 10*3/uL (ref 1.4–6.5)
Neutrophil %: 80 %
Platelet: 113 10*3/uL — ABNORMAL LOW (ref 150–440)
RBC: 3.73 10*6/uL — ABNORMAL LOW (ref 3.80–5.20)
RDW: 14.6 % — ABNORMAL HIGH (ref 11.5–14.5)

## 2013-06-16 LAB — COMPREHENSIVE METABOLIC PANEL
Albumin: 2.6 g/dL — ABNORMAL LOW (ref 3.4–5.0)
Calcium, Total: 8.7 mg/dL (ref 8.5–10.1)
Chloride: 115 mmol/L — ABNORMAL HIGH (ref 98–107)
Co2: 21 mmol/L (ref 21–32)
EGFR (African American): 31 — ABNORMAL LOW
EGFR (Non-African Amer.): 27 — ABNORMAL LOW
Osmolality: 292 (ref 275–301)
Potassium: 3.6 mmol/L (ref 3.5–5.1)
SGPT (ALT): 32 U/L (ref 12–78)
Total Protein: 5.6 g/dL — ABNORMAL LOW (ref 6.4–8.2)

## 2013-06-16 LAB — TSH: Thyroid Stimulating Horm: 1.94 u[IU]/mL

## 2013-06-17 LAB — COMPREHENSIVE METABOLIC PANEL
Albumin: 2.4 g/dL — ABNORMAL LOW (ref 3.4–5.0)
Anion Gap: 8 (ref 7–16)
BUN: 23 mg/dL — ABNORMAL HIGH (ref 7–18)
Calcium, Total: 8.7 mg/dL (ref 8.5–10.1)
Chloride: 112 mmol/L — ABNORMAL HIGH (ref 98–107)
Co2: 22 mmol/L (ref 21–32)
Creatinine: 1.9 mg/dL — ABNORMAL HIGH (ref 0.60–1.30)
EGFR (African American): 34 — ABNORMAL LOW
EGFR (Non-African Amer.): 29 — ABNORMAL LOW
Glucose: 66 mg/dL (ref 65–99)
Osmolality: 285 (ref 275–301)
Sodium: 142 mmol/L (ref 136–145)
Total Protein: 5.4 g/dL — ABNORMAL LOW (ref 6.4–8.2)

## 2013-06-18 LAB — CBC WITH DIFFERENTIAL/PLATELET
Eosinophil %: 0.5 %
HCT: 30.6 % — ABNORMAL LOW (ref 35.0–47.0)
Lymphocyte %: 24.4 %
MCH: 28.2 pg (ref 26.0–34.0)
MCHC: 33.2 g/dL (ref 32.0–36.0)
Neutrophil %: 66.3 %
Platelet: 85 10*3/uL — ABNORMAL LOW (ref 150–440)
RBC: 3.61 10*6/uL — ABNORMAL LOW (ref 3.80–5.20)
WBC: 4.5 10*3/uL (ref 3.6–11.0)

## 2013-06-18 LAB — COMPREHENSIVE METABOLIC PANEL
Alkaline Phosphatase: 106 U/L (ref 50–136)
Anion Gap: 7 (ref 7–16)
BUN: 14 mg/dL (ref 7–18)
Bilirubin,Total: 0.6 mg/dL (ref 0.2–1.0)
Chloride: 115 mmol/L — ABNORMAL HIGH (ref 98–107)
Co2: 21 mmol/L (ref 21–32)
EGFR (African American): 46 — ABNORMAL LOW
Osmolality: 284 (ref 275–301)
Sodium: 143 mmol/L (ref 136–145)
Total Protein: 5.4 g/dL — ABNORMAL LOW (ref 6.4–8.2)

## 2013-06-18 LAB — MAGNESIUM: Magnesium: 1.5 mg/dL — ABNORMAL LOW

## 2013-06-18 LAB — FIBRINOGEN: Fibrinogen: 259 mg/dL (ref 210–470)

## 2013-06-18 LAB — PROTIME-INR: Prothrombin Time: 15.8 secs — ABNORMAL HIGH (ref 11.5–14.7)

## 2013-06-18 LAB — APTT: Activated PTT: 40.2 secs — ABNORMAL HIGH (ref 23.6–35.9)

## 2013-06-18 LAB — AMYLASE: Amylase: 467 U/L — ABNORMAL HIGH (ref 25–115)

## 2013-06-20 LAB — COMPREHENSIVE METABOLIC PANEL
Anion Gap: 8 (ref 7–16)
BUN: 7 mg/dL (ref 7–18)
Chloride: 111 mmol/L — ABNORMAL HIGH (ref 98–107)
Co2: 21 mmol/L (ref 21–32)
EGFR (Non-African Amer.): 50 — ABNORMAL LOW
SGOT(AST): 50 U/L — ABNORMAL HIGH (ref 15–37)
Total Protein: 5.7 g/dL — ABNORMAL LOW (ref 6.4–8.2)

## 2013-06-20 LAB — LIPASE, BLOOD: Lipase: 890 U/L — ABNORMAL HIGH (ref 73–393)

## 2013-06-20 LAB — MAGNESIUM: Magnesium: 2.1 mg/dL

## 2013-06-21 LAB — CBC WITH DIFFERENTIAL/PLATELET
Basophil #: 0 10*3/uL (ref 0.0–0.1)
Basophil #: 0 10*3/uL (ref 0.0–0.1)
Basophil %: 0.1 %
Basophil %: 0.1 %
Eosinophil #: 0 10*3/uL (ref 0.0–0.7)
Eosinophil %: 0.3 %
HCT: 28.7 % — ABNORMAL LOW (ref 35.0–47.0)
HGB: 10.3 g/dL — ABNORMAL LOW (ref 12.0–16.0)
Lymphocyte #: 0.8 10*3/uL — ABNORMAL LOW (ref 1.0–3.6)
Lymphocyte %: 28.1 %
MCH: 28 pg (ref 26.0–34.0)
MCHC: 32.8 g/dL (ref 32.0–36.0)
MCV: 85 fL (ref 80–100)
Monocyte #: 0.4 x10 3/mm (ref 0.2–0.9)
Monocyte %: 8.5 %
Monocyte %: 9 %
Neutrophil #: 2.7 10*3/uL (ref 1.4–6.5)
Neutrophil #: 3.1 10*3/uL (ref 1.4–6.5)
Neutrophil %: 62.8 %
RBC: 3.68 10*6/uL — ABNORMAL LOW (ref 3.80–5.20)
RBC: 3.39 10*6/uL — ABNORMAL LOW (ref 3.80–5.20)
WBC: 4.3 10*3/uL (ref 3.6–11.0)

## 2013-06-21 LAB — BASIC METABOLIC PANEL
Anion Gap: 8 (ref 7–16)
BUN: 5 mg/dL — ABNORMAL LOW (ref 7–18)
Calcium, Total: 8.7 mg/dL (ref 8.5–10.1)
Chloride: 108 mmol/L — ABNORMAL HIGH (ref 98–107)
Co2: 22 mmol/L (ref 21–32)
Creatinine: 1.27 mg/dL (ref 0.60–1.30)
EGFR (African American): 55 — ABNORMAL LOW
Glucose: 92 mg/dL (ref 65–99)
Potassium: 3.4 mmol/L — ABNORMAL LOW (ref 3.5–5.1)

## 2013-06-22 LAB — MAGNESIUM: Magnesium: 1.7 mg/dL — ABNORMAL LOW

## 2013-06-22 LAB — POTASSIUM: Potassium: 3.6 mmol/L (ref 3.5–5.1)

## 2013-06-23 LAB — LIPASE, BLOOD: Lipase: 778 U/L — ABNORMAL HIGH (ref 73–393)

## 2013-06-23 LAB — BASIC METABOLIC PANEL
Calcium, Total: 8.7 mg/dL (ref 8.5–10.1)
Chloride: 114 mmol/L — ABNORMAL HIGH (ref 98–107)
EGFR (African American): 54 — ABNORMAL LOW
Glucose: 99 mg/dL (ref 65–99)
Potassium: 3.8 mmol/L (ref 3.5–5.1)
Sodium: 143 mmol/L (ref 136–145)

## 2013-06-23 LAB — MAGNESIUM: Magnesium: 1.5 mg/dL — ABNORMAL LOW

## 2013-06-25 LAB — MAGNESIUM: Magnesium: 3 mg/dL — ABNORMAL HIGH

## 2013-06-25 LAB — LIPASE, BLOOD: Lipase: 415 U/L — ABNORMAL HIGH (ref 73–393)

## 2013-06-25 LAB — PLATELET COUNT: Platelet: 222 10*3/uL (ref 150–440)

## 2013-06-28 ENCOUNTER — Inpatient Hospital Stay: Payer: Self-pay | Admitting: Internal Medicine

## 2013-06-28 LAB — CBC
HGB: 8.6 g/dL — ABNORMAL LOW (ref 12.0–16.0)
MCH: 28.7 pg (ref 26.0–34.0)
MCHC: 33.8 g/dL (ref 32.0–36.0)
MCV: 85 fL (ref 80–100)
RBC: 3.01 10*6/uL — ABNORMAL LOW (ref 3.80–5.20)
WBC: 6.1 10*3/uL (ref 3.6–11.0)

## 2013-06-28 LAB — COMPREHENSIVE METABOLIC PANEL
Albumin: 2.7 g/dL — ABNORMAL LOW (ref 3.4–5.0)
Alkaline Phosphatase: 80 U/L (ref 50–136)
BUN: 4 mg/dL — ABNORMAL LOW (ref 7–18)
Calcium, Total: 8.2 mg/dL — ABNORMAL LOW (ref 8.5–10.1)
Co2: 23 mmol/L (ref 21–32)
EGFR (African American): 42 — ABNORMAL LOW
EGFR (Non-African Amer.): 36 — ABNORMAL LOW
Glucose: 81 mg/dL (ref 65–99)
Osmolality: 279 (ref 275–301)
Potassium: 2.8 mmol/L — ABNORMAL LOW (ref 3.5–5.1)
SGOT(AST): 24 U/L (ref 15–37)
SGPT (ALT): 13 U/L (ref 12–78)
Sodium: 142 mmol/L (ref 136–145)
Total Protein: 5.5 g/dL — ABNORMAL LOW (ref 6.4–8.2)

## 2013-06-28 LAB — URINALYSIS, COMPLETE
Bacteria: NONE SEEN
Bilirubin,UR: NEGATIVE
Blood: NEGATIVE
Glucose,UR: NEGATIVE mg/dL (ref 0–75)
Hyaline Cast: 3
Nitrite: NEGATIVE
Ph: 5 (ref 4.5–8.0)
Protein: NEGATIVE
RBC,UR: 1 /HPF (ref 0–5)
Squamous Epithelial: <1
WBC UR: 5 /HPF (ref 0–5)

## 2013-06-28 LAB — CK TOTAL AND CKMB (NOT AT ARMC)
CK, Total: 57 U/L (ref 21–215)
CK-MB: 0.8 ng/mL (ref 0.5–3.6)

## 2013-06-28 LAB — TROPONIN I: Troponin-I: <0.02 ng/mL

## 2013-06-29 LAB — CBC WITH DIFFERENTIAL/PLATELET
Eosinophil #: 0 10*3/uL (ref 0.0–0.7)
HGB: 8 g/dL — ABNORMAL LOW (ref 12.0–16.0)
MCHC: 33.4 g/dL (ref 32.0–36.0)
MCV: 85 fL (ref 80–100)
Monocyte #: 0.6 x10 3/mm (ref 0.2–0.9)
Monocyte %: 10.3 %
Neutrophil #: 3.9 10*3/uL (ref 1.4–6.5)
Neutrophil %: 66 %
Platelet: 282 10*3/uL (ref 150–440)
WBC: 5.9 10*3/uL (ref 3.6–11.0)

## 2013-06-29 LAB — MAGNESIUM: Magnesium: 1.6 mg/dL — ABNORMAL LOW

## 2013-06-29 LAB — COMPREHENSIVE METABOLIC PANEL
Alkaline Phosphatase: 81 U/L (ref 50–136)
Bilirubin,Total: 0.3 mg/dL (ref 0.2–1.0)
Calcium, Total: 8.3 mg/dL — ABNORMAL LOW (ref 8.5–10.1)
Chloride: 112 mmol/L — ABNORMAL HIGH (ref 98–107)
Co2: 25 mmol/L (ref 21–32)
Creatinine: 1.22 mg/dL (ref 0.60–1.30)
Osmolality: 277 (ref 275–301)
Potassium: 3.2 mmol/L — ABNORMAL LOW (ref 3.5–5.1)
Sodium: 141 mmol/L (ref 136–145)
Total Protein: 5.1 g/dL — ABNORMAL LOW (ref 6.4–8.2)

## 2013-06-30 LAB — BASIC METABOLIC PANEL
Calcium, Total: 8.6 mg/dL (ref 8.5–10.1)
Chloride: 111 mmol/L — ABNORMAL HIGH (ref 98–107)
Co2: 25 mmol/L (ref 21–32)
Creatinine: 1.43 mg/dL — ABNORMAL HIGH (ref 0.60–1.30)
EGFR (African American): 48 — ABNORMAL LOW
EGFR (Non-African Amer.): 41 — ABNORMAL LOW
Glucose: 88 mg/dL (ref 65–99)
Osmolality: 281 (ref 275–301)
Potassium: 3.7 mmol/L (ref 3.5–5.1)
Sodium: 143 mmol/L (ref 136–145)

## 2013-06-30 LAB — CBC WITH DIFFERENTIAL/PLATELET
Basophil #: 0 10*3/uL (ref 0.0–0.1)
Basophil %: 0 %
Eosinophil %: 0.7 %
HCT: 26.1 % — ABNORMAL LOW (ref 35.0–47.0)
Lymphocyte %: 25.9 %
MCH: 28.6 pg (ref 26.0–34.0)
MCHC: 33.4 g/dL (ref 32.0–36.0)
Monocyte #: 0.6 x10 3/mm (ref 0.2–0.9)
Platelet: 279 10*3/uL (ref 150–440)

## 2013-07-01 DIAGNOSIS — R4182 Altered mental status, unspecified: Secondary | ICD-10-CM

## 2013-07-01 LAB — IRON AND TIBC
Iron Bind.Cap.(Total): 116 ug/dL — ABNORMAL LOW (ref 250–450)
Iron Saturation: 32 %
Iron: 37 ug/dL — ABNORMAL LOW (ref 50–170)

## 2013-07-01 LAB — BASIC METABOLIC PANEL
Anion Gap: 5 — ABNORMAL LOW (ref 7–16)
BUN: 3 mg/dL — ABNORMAL LOW (ref 7–18)
Calcium, Total: 8.5 mg/dL (ref 8.5–10.1)
Creatinine: 1.32 mg/dL — ABNORMAL HIGH (ref 0.60–1.30)
EGFR (African American): 53 — ABNORMAL LOW
EGFR (Non-African Amer.): 45 — ABNORMAL LOW
Glucose: 74 mg/dL (ref 65–99)
Potassium: 3.7 mmol/L (ref 3.5–5.1)

## 2013-07-02 LAB — HEPATIC FUNCTION PANEL A (ARMC)
Alkaline Phosphatase: 77 U/L (ref 50–136)
Bilirubin,Total: 0.3 mg/dL (ref 0.2–1.0)
SGOT(AST): 16 U/L (ref 15–37)
SGPT (ALT): 8 U/L — ABNORMAL LOW (ref 12–78)

## 2013-07-02 LAB — BASIC METABOLIC PANEL
Anion Gap: 5 — ABNORMAL LOW (ref 7–16)
Calcium, Total: 8.2 mg/dL — ABNORMAL LOW (ref 8.5–10.1)
Co2: 25 mmol/L (ref 21–32)
Creatinine: 1.18 mg/dL (ref 0.60–1.30)
EGFR (African American): >60
EGFR (Non-African Amer.): 52 — ABNORMAL LOW
Glucose: 70 mg/dL (ref 65–99)
Osmolality: 282 (ref 275–301)
Sodium: 144 mmol/L (ref 136–145)

## 2013-07-02 LAB — PHENYTOIN LEVEL, TOTAL: Dilantin: 3.4 ug/mL — ABNORMAL LOW (ref 10.0–20.0)

## 2013-07-02 LAB — SEDIMENTATION RATE: Erythrocyte Sed Rate: 29 mm/h

## 2013-07-02 LAB — OCCULT BLOOD X 1 CARD TO LAB, STOOL: Occult Blood, Feces: POSITIVE

## 2013-07-02 LAB — AMMONIA: Ammonia, Plasma: <25 umol/L

## 2013-07-02 LAB — TSH: Thyroid Stimulating Horm: 1.86 u[IU]/mL

## 2013-07-08 ENCOUNTER — Ambulatory Visit: Payer: Self-pay | Admitting: Internal Medicine

## 2013-08-27 ENCOUNTER — Emergency Department: Payer: Self-pay | Admitting: Emergency Medicine

## 2013-10-20 IMAGING — CR DG CHEST 1V PORT
1 series · 1 of 1 positions shown · non-contrast
Comparison: none

REASON FOR EXAM: Rule/out, possible, probable, and suspected do not
comply with corporate complia
COMMENTS:

[ap]
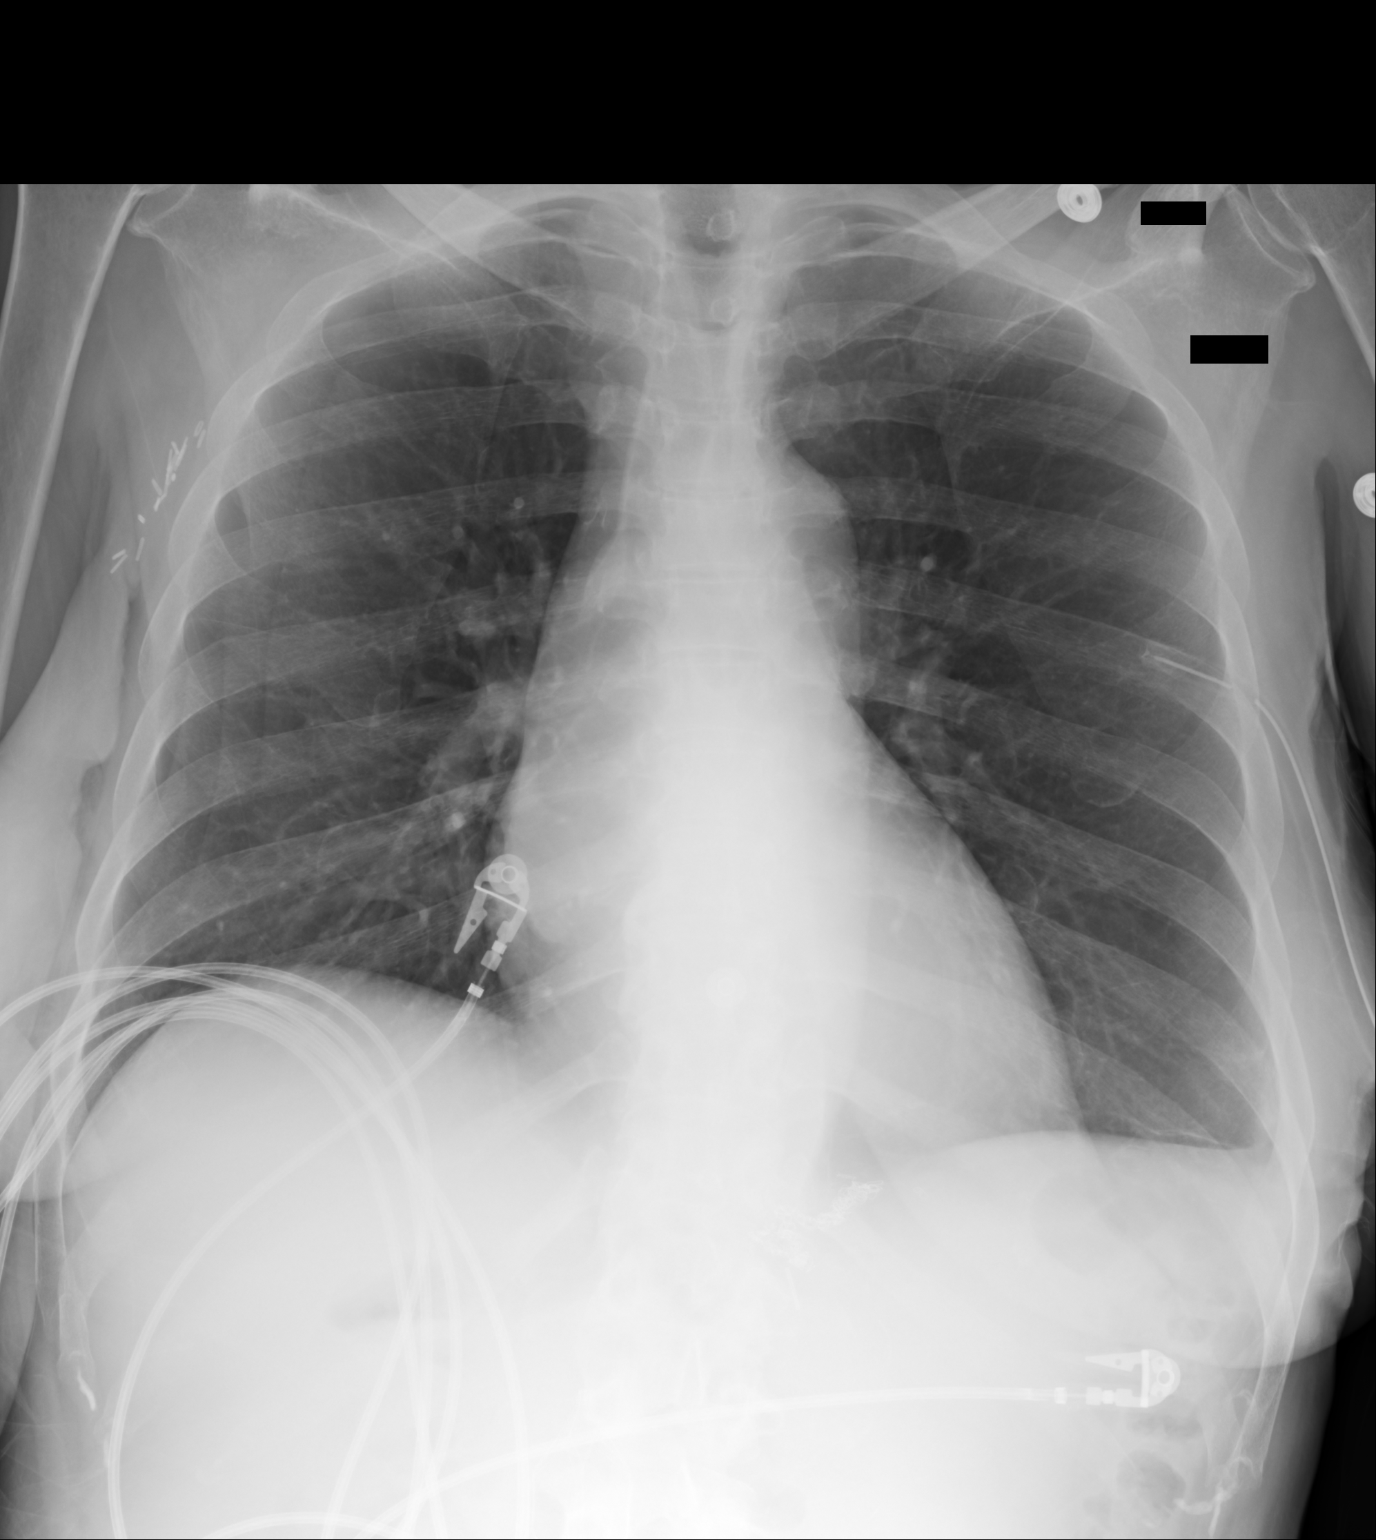

[1 of 1 positions shown; findings below may reference images not displayed]

PROCEDURE:     DXR - DXR PORTABLE CHEST SINGLE VIEW  - June 30, 2013  [DATE]

RESULT:     Comparison is made to study 30 June, 2013 at [DATE] a.m.

The left chest tube appears to been partially withdrawn. There is no
evidence of a recurrent pneumothorax. There is stable blunting of the left
lateral costophrenic angle. The right lung is clear. The cardiac silhouette
is normal in size.
IMPRESSION: There is no evidence of a pneumothorax currently.

[REDACTED]

## 2013-10-20 IMAGING — CR DG CHEST 1V PORT
1 series · 1 of 1 positions shown · non-contrast
Comparison: none

REASON FOR EXAM: left PTX
COMMENTS:

[ap]
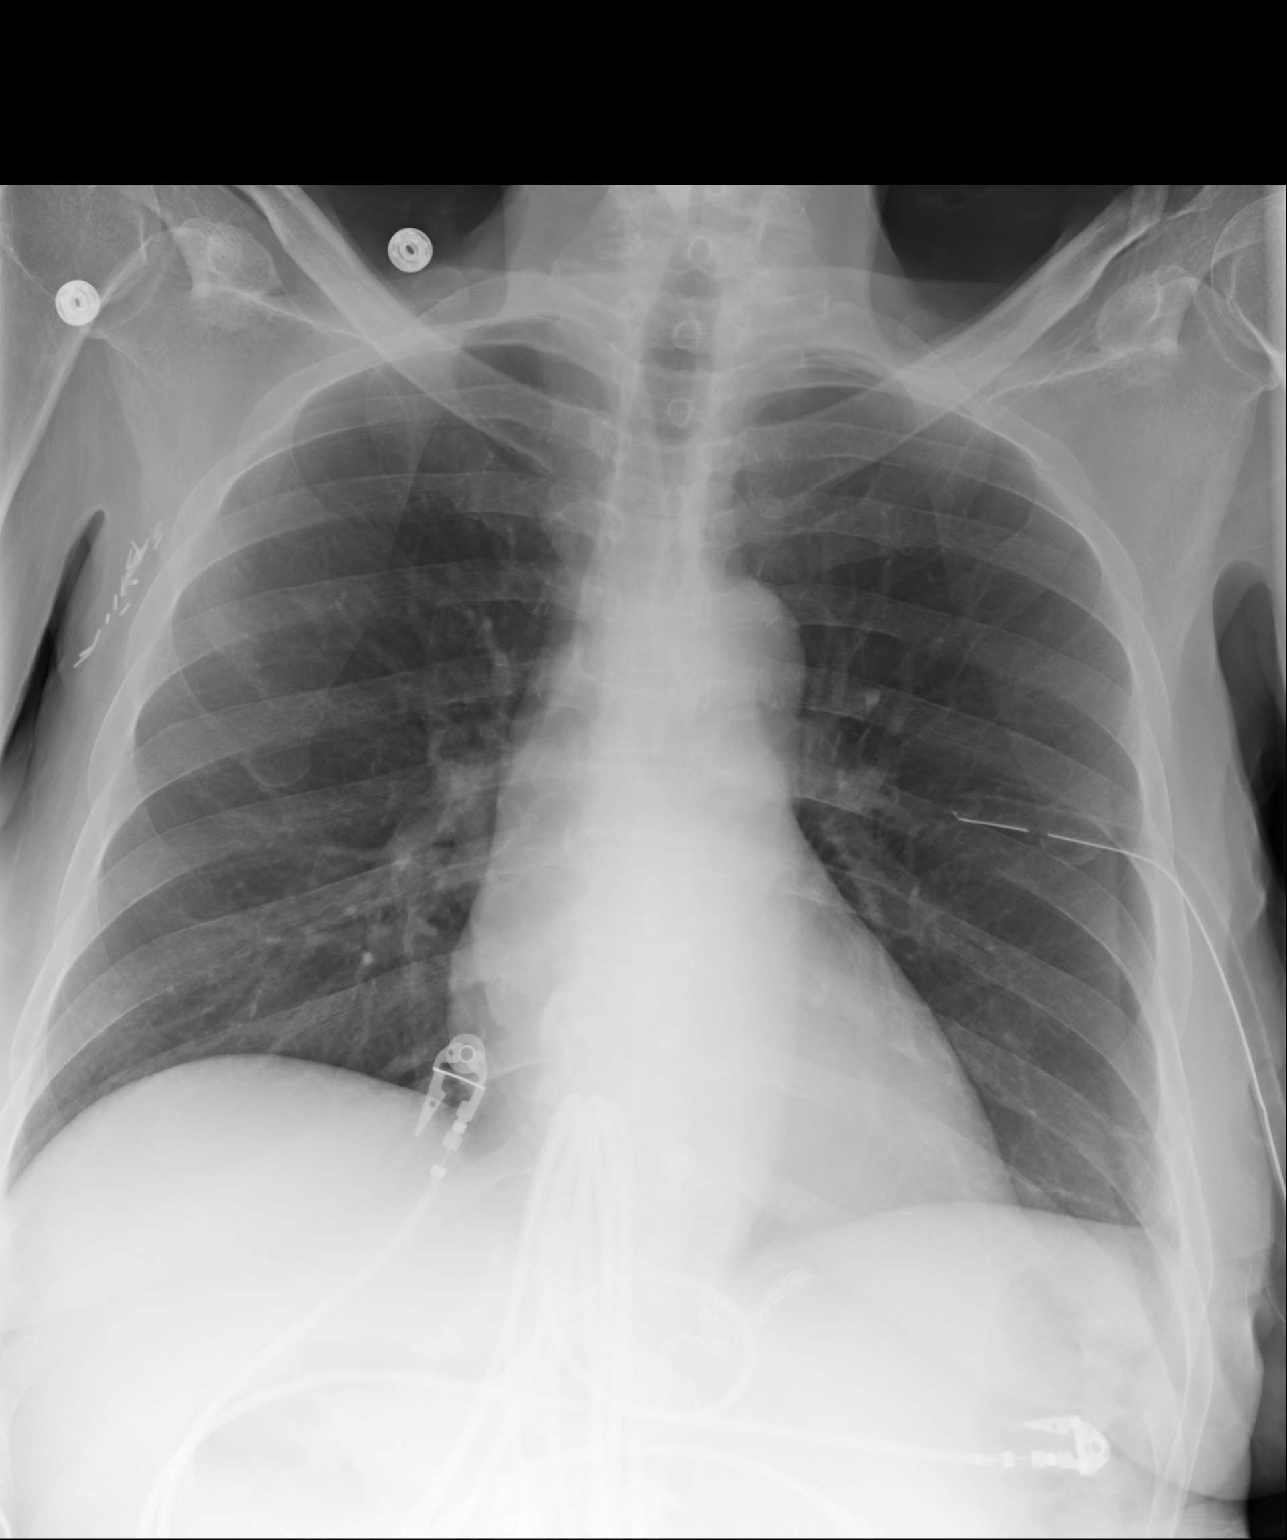

[1 of 1 positions shown; findings below may reference images not displayed]

PROCEDURE:     DXR - DXR PORTABLE CHEST SINGLE VIEW  - June 30, 2013  [DATE]

RESULT:     Comparison is made to the study 29 June, 2013.

The lungs are well-expanded. No residual pneumothorax on the left straight.
The left-sided chest tube appears unchanged in position. The cardiac
silhouette is normal in size. The pulmonary vascularity is not engorged.
IMPRESSION: There is no evidence of a residual pneumothorax on the
left. No significant atelectasis at the left lung base is demonstrated
either.

[REDACTED]

## 2013-10-21 IMAGING — CT CT HEAD WITHOUT CONTRAST
1 series · 16 of 29 positions shown, 20 images · non-contrast
Comparison: none

REASON FOR EXAM: lethargic
COMMENTS:

PROCEDURE:     CT  - CT HEAD WITHOUT CONTRAST  - July 01, 2013  [DATE]
RESULT:     Technique: Helical noncontrasted 5 mm sections were obtained
from the skull base through the vertex.

[Series 2: soft tissue · axial · 0.39mm/px · z∈[+231,+361]mm · 16 of 29 slices shown, 20 images]
[im 2/29  brain]
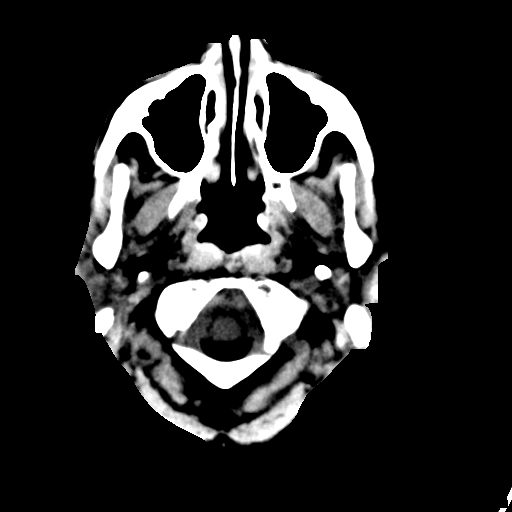
[im 2/29  bone]
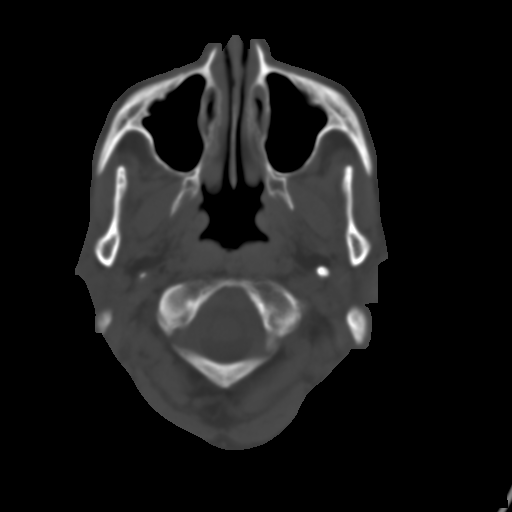
[im 4/29  brain]
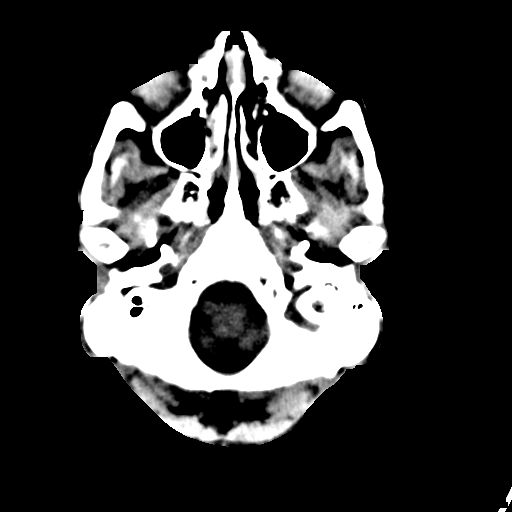
[im 6/29  brain]
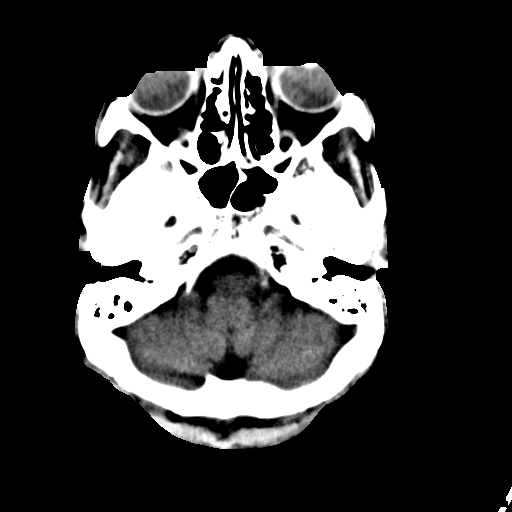
[im 7/29  brain]
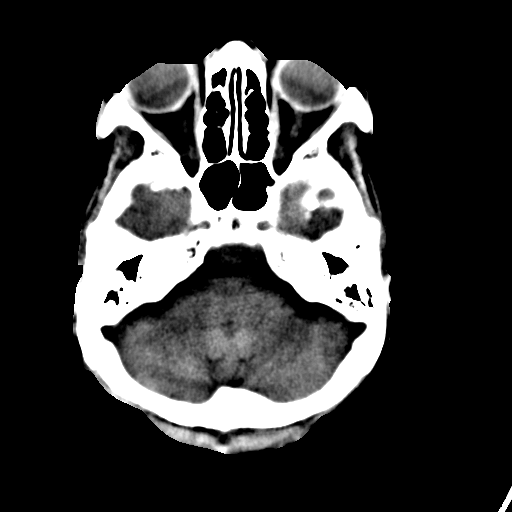
[im 9/29  brain]
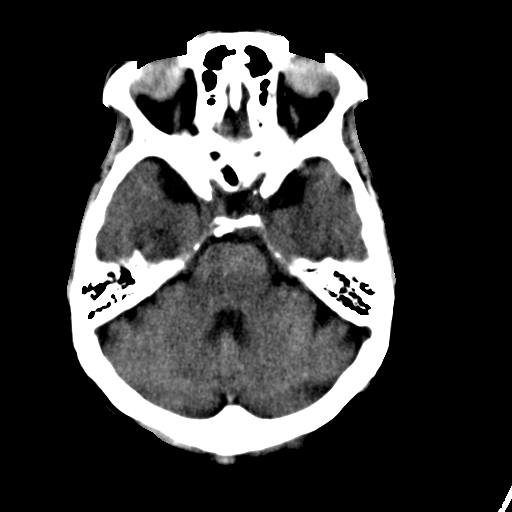
[im 9/29  bone]
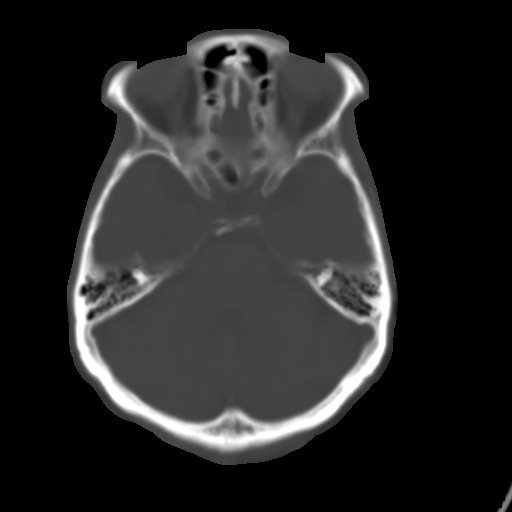
[im 11/29  brain]
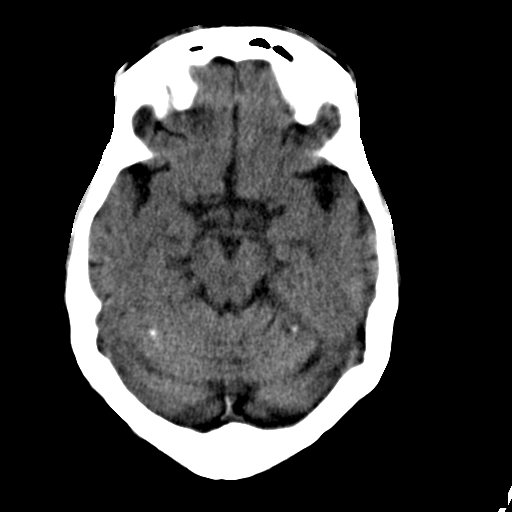
[im 12/29  brain]
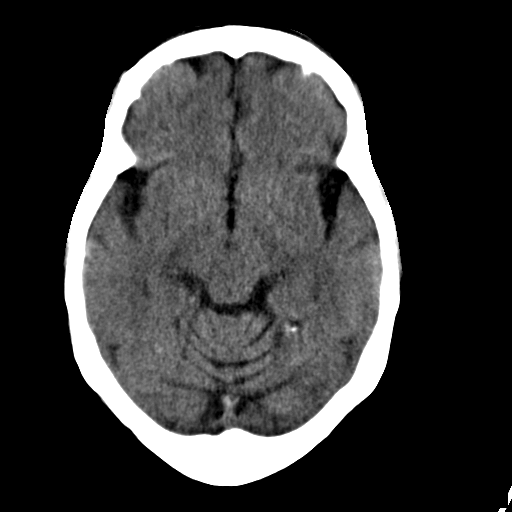
[im 14/29  brain]
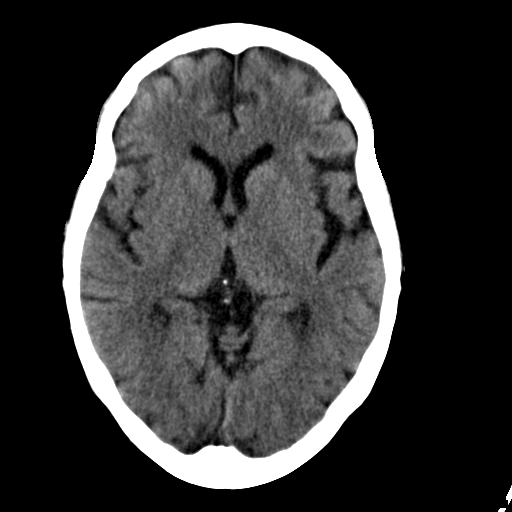
[im 16/29  brain]
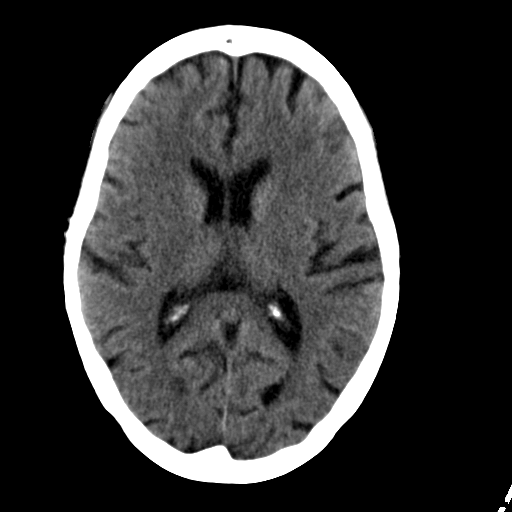
[im 16/29  bone]
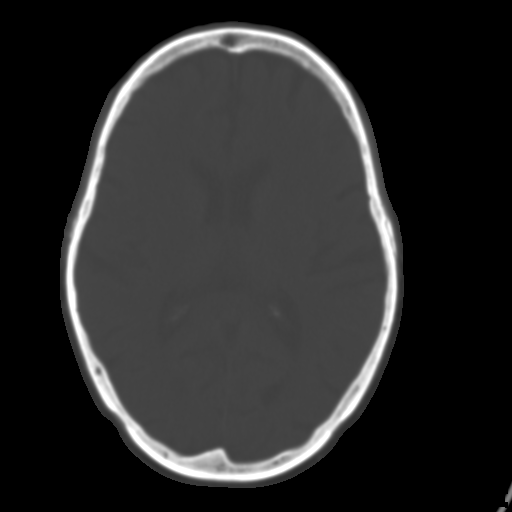
[im 18/29  brain]
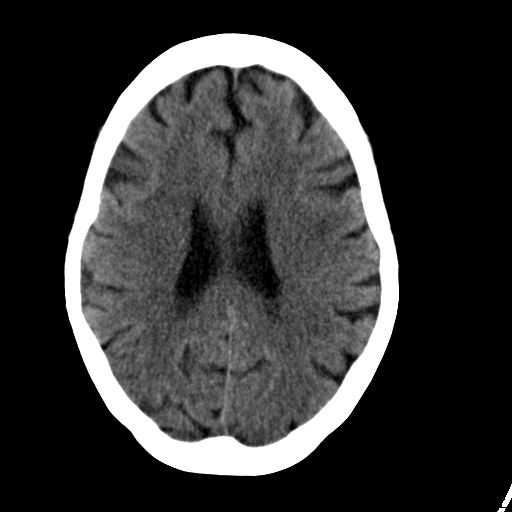
[im 19/29  brain]
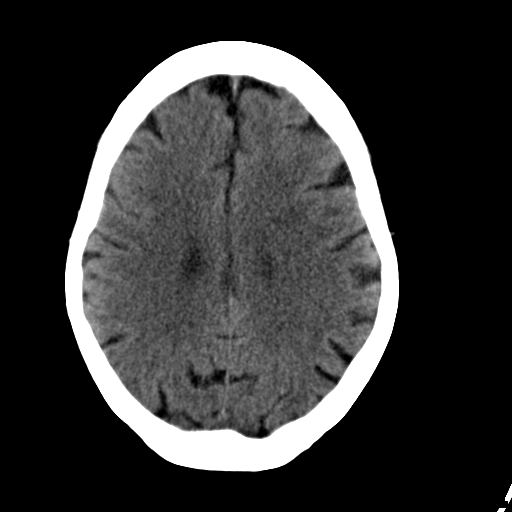
[im 21/29  brain]
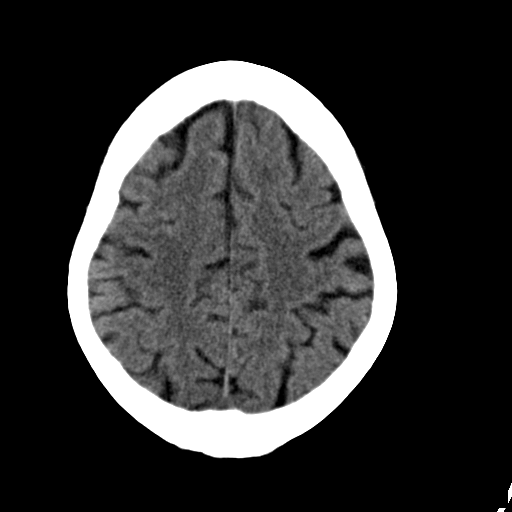
[im 23/29  brain]
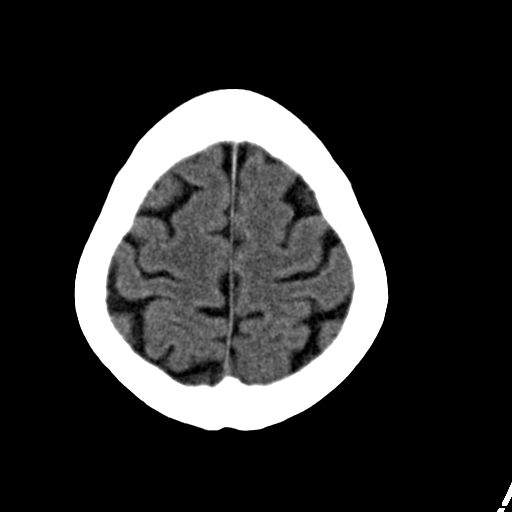
[im 23/29  bone]
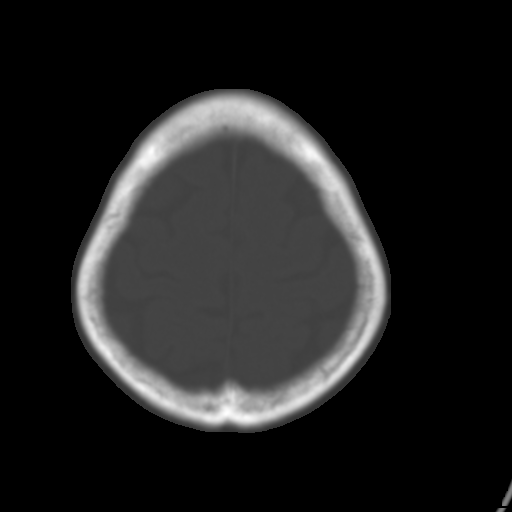
[im 24/29  brain]
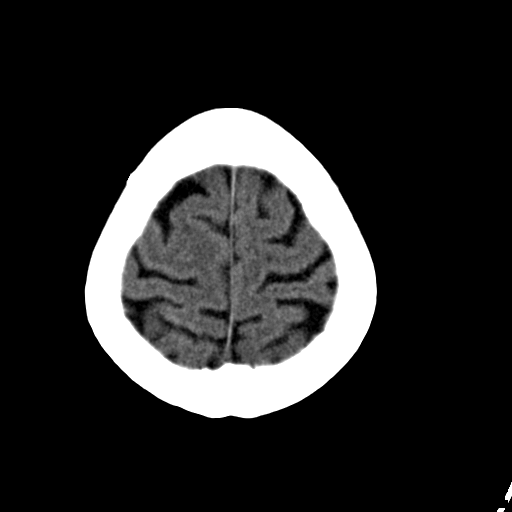
[im 26/29  brain]
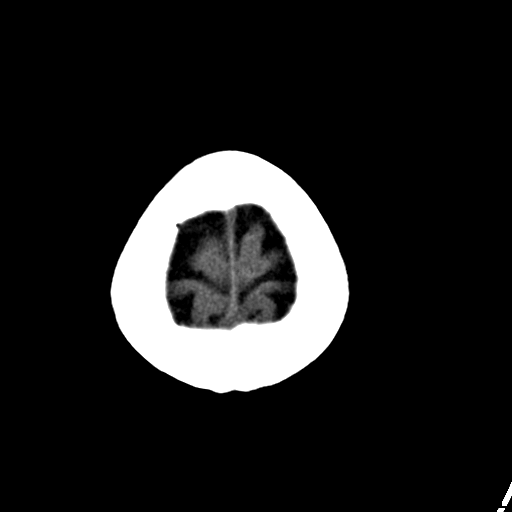
[im 28/29  brain]
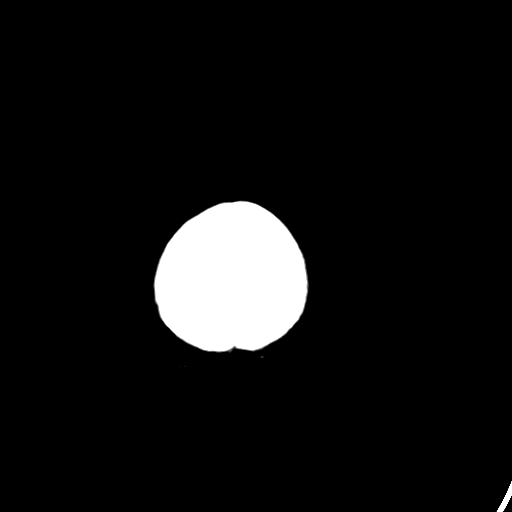

[16 of 29 positions shown; findings below may reference images not displayed]

FINDINGS: Mild diffuse cortical and cerebellar atrophy is identified as well
as mild diffuse areas of low attenuation within the subcortical, deep and
periventricular white matter regions. There is not evidence of intra-axial
nor extra-axial fluid collections, acute hemorrhage, mass effect, nor a
depressed skull fracture. The visualized paranasal sinuses and mastoid air
cells are patent.
IMPRESSION: Mild chronic and involutional changes without evidence of
acute abnormalities. If there is persistent clinical concern further
evaluation with MRI is recommended.
2. Comparison made to prior study dated 05/31/2012

## 2014-11-25 ENCOUNTER — Emergency Department: Payer: Self-pay | Admitting: Emergency Medicine

## 2014-11-25 LAB — URINALYSIS, COMPLETE
BILIRUBIN, UR: NEGATIVE
BLOOD: NEGATIVE
GLUCOSE, UR: NEGATIVE mg/dL (ref 0–75)
Leukocyte Esterase: NEGATIVE
NITRITE: NEGATIVE
PH: 5 (ref 4.5–8.0)
Protein: NEGATIVE
RBC,UR: 17 /HPF (ref 0–5)
SPECIFIC GRAVITY: 1.026 (ref 1.003–1.030)
WBC UR: 5 /HPF (ref 0–5)

## 2014-11-25 LAB — CBC
HCT: 37.5 % (ref 35.0–47.0)
HGB: 12.1 g/dL (ref 12.0–16.0)
MCH: 29.4 pg (ref 26.0–34.0)
MCHC: 32.1 g/dL (ref 32.0–36.0)
MCV: 92 fL (ref 80–100)
Platelet: 149 10*3/uL — ABNORMAL LOW (ref 150–440)
RBC: 4.1 10*6/uL (ref 3.80–5.20)
RDW: 15.3 % — AB (ref 11.5–14.5)
WBC: 6.4 10*3/uL (ref 3.6–11.0)

## 2014-11-25 LAB — COMPREHENSIVE METABOLIC PANEL
ANION GAP: 6 — AB (ref 7–16)
Albumin: 3.2 g/dL — ABNORMAL LOW (ref 3.4–5.0)
Alkaline Phosphatase: 58 U/L
BUN: 13 mg/dL (ref 7–18)
Bilirubin,Total: 0.3 mg/dL (ref 0.2–1.0)
CHLORIDE: 114 mmol/L — AB (ref 98–107)
Calcium, Total: 8.5 mg/dL (ref 8.5–10.1)
Co2: 25 mmol/L (ref 21–32)
Creatinine: 0.85 mg/dL (ref 0.60–1.30)
EGFR (Non-African Amer.): >60
Glucose: 93 mg/dL (ref 65–99)
Osmolality: 289 (ref 275–301)
POTASSIUM: 3.8 mmol/L (ref 3.5–5.1)
SGOT(AST): 23 U/L (ref 15–37)
SGPT (ALT): 12 U/L — ABNORMAL LOW
Sodium: 145 mmol/L (ref 136–145)
TOTAL PROTEIN: 6.3 g/dL — AB (ref 6.4–8.2)

## 2014-11-25 LAB — LIPASE, BLOOD: Lipase: 40 U/L — ABNORMAL LOW (ref 73–393)

## 2014-12-23 ENCOUNTER — Emergency Department: Payer: Self-pay | Admitting: Emergency Medicine

## 2015-01-05 ENCOUNTER — Observation Stay: Payer: Self-pay | Admitting: Internal Medicine

## 2015-01-09 ENCOUNTER — Emergency Department: Payer: Self-pay | Admitting: Emergency Medicine

## 2015-01-12 ENCOUNTER — Emergency Department: Payer: Self-pay | Admitting: Emergency Medicine

## 2015-02-27 NOTE — Discharge Summary (Signed)
PATIENT NAME:  Lindsay [name], Lindsay [name] MR#:  [id number] DATE OF BIRTH:  26-Jan-1956  DATE OF ADMISSION:  06/15/2013 DATE OF DISCHARGE:  06/25/2013  PRIMARY CARE PHYSICIAN:  None local.  ADDENDUM:    For detailed discharge summary, please refer to the interim discharge summary dictated by Dr. Manuella Ghazi.  The patient was transferred to CCU due to severe orthostasis with hypotension, tachycardia, which is possibly due to dehydration, so the patient was treated with D5 normal saline rehydration support.  Blood pressure has been stable.  The patient's lethargy has been improved.  1.  Acute gallstone pancreatitis.  The patient continues with complaints of abdominal pain, but is improving.  The patient's only has mild abdominal pain today.  Lipase decreased to 450. The patient tolerated full liquid, will advance to regular diet. If can patient can tolerate regular diet, the patient will be discharged to home with home health today.  2. Hypomagnesemia. The patient's magnesium is 1.2 today. We will give supplement today and repeat magnesium level.  3. The patient has weakness and malnutrition, according to physical therapy evaluation. The patient then need home physical therapy with home health. The patient is clinically stable and will be discharged home with home health and PT today. I discussed the patient's discharge plan with the patient, the patient's mother and case manager and nurse and physical therapist.   TIME SPENT: About 42 minutes.   DISCHARGE DIAGNOSES: 1.  Acute gallstone pancreatitis.  2.  Acute renal failure.  3.  Severe orthostasis  with hypotension, tachycardia.   4.  Hypomagnesemia.  5.  Severe malnutrition.  6.  Diastolic congestive heart failure, ejection fraction of 40% to 45%.   CONDITION: Stable.   CODE STATUS: Full code.   HOME MEDICATIONS: Please refer to the [facility] discharge instruction medication reconciliation list.   The patient needs home health and physical therapy.   DIET: Low  sodium diet.   ACTIVITY: As tolerated.   FOLLOW-UP CARE: Follow up with Dr. Tiffany Kocher within one week. Follow up with Dr. Pat Patrick within1 to 2 weeks, and also patient needs to follow up with PCP within 1 to 2 weeks.   ____________________________ Demetrios Loll, MD qc:cc D: 06/25/2013 13:34:00 ET T: 06/25/2013 15:33:14 ET JOB#: [id number]  cc: Demetrios Loll, MD, <Dictator> Demetrios Loll MD ELECTRONICALLY SIGNED 07/02/2013 13:19

## 2015-02-27 NOTE — H&P (Signed)
PATIENT NAME:  Lindsay [name], MESSINGER MR#:  [id number] DATE OF BIRTH:  1956/06/20  DATE OF ADMISSION:  06/15/2013  REFERRING PHYSICIAN:  Dr. Thomasene Lot.   FAMILY PHYSICIAN:  None local.   REASON FOR ADMISSION:  Intractable nausea and vomiting with acute renal failure.   HISTORY OF PRESENT ILLNESS:  The patient is a 59 year old female with a significant history of breast cancer, status post lumpectomy as well as a gastric bypass in 2005, who presents with a 2-week history of intractable nausea and vomiting. Unable to keep anything down. Has had some diarrhea. Has diffuse abdominal pain. In the Emergency Room, the patient was noted to be mildly hypotensive in acute renal failure by lab. She is now admitted for further evaluation.   PAST MEDICAL HISTORY:  1.  Breast cancer, status post lumpectomy.  2.  Anxiety/depression.  3.  Peripheral neuropathy.  4.  GE reflux disease.  5.  A history of atrial fibrillation.  6.  A history of congestive heart failure.  7.  Status post hysterectomy.  8.  A history of gastric bypass in 2005.   MEDICATIONS ON ADMISSION: 1.  B12 1000 mcg p.o. daily.  2.  Percocet 5/325, 1 p.o. q.6h. p.r.n. pain.  3.  Prilosec 40 mg p.o. daily.  4.  Cymbalta 30 mg p.o. daily.  5.  Klonopin 0.5 mg p.o. b.i.d.  6.  Bentyl 10 mg p.o. q.i.d.   ALLERGIES:  NO KNOWN DRUG ALLERGIES.   SOCIAL HISTORY:  The patient denies alcohol or tobacco abuse at this time. Is a former smoker, but none times several years.   FAMILY HISTORY:  Positive for diabetes and stroke.   REVIEW OF SYSTEMS.  CONSTITUTIONAL:  No fever, although she has had weight loss.  EYES:  No blurred or double vision. No glaucoma.  ENT:  No tinnitus or hearing loss. No nasal discharge or bleeding. No difficulty swallowing.  RESPIRATORY:  No cough or wheezing. Denies hemoptysis. No painful respiration.  CARDIOVASCULAR:  No chest or orthopnea. No palpitations or syncope.  GASTROINTESTINAL:  As per HPI.  GENITOURINARY:  No  dysuria, hematuria, or incontinence.  ENDOCRINE:  No polyuria or polydipsia. No heat or cold intolerance.  HEMATOLOGIC:  Denies anemia, easy bruising, or bleeding.  LYMPHATIC:  No swollen glands.  MUSCULOSKELETAL:  Has pain in her neck, back, shoulders, knees, hips, no gout.  NEUROLOGIC:  No numbness. Does have weakness. No migraines, stroke, or seizures.  PSYCHIATRIC:  The patient denies anxiety, insomnia, or depression.   PHYSICAL EXAMINATION:  GENERAL:  The patient is older appearing than her stated age.  VITAL SIGNS:  Currently remarkable for a blood pressure 117/82, heart rate of 93 and a respiratory rate of 17. She is afebrile.  HEENT:  Normocephalic, atraumatic. Pupils equally round and reactive to light and accommodation. Extraocular movements are intact. Sclerae anicteric. Conjunctivae are clear.  OROPHARYNX:  Clear.  NECK:  Supple without JVD. No adenopathy or thyromegaly is noted.  LUNGS:  Essentially clear to auscultation and percussion without wheezes, rales, or rhonchi. No dullness. Respiratory effort is normal.  CARDIAC:  Regular rate and rhythm with normal S1 and S2. No significant rubs, murmurs, or gallops. PMI is nondisplaced. Chest wall is nontender.  ABDOMEN:  Soft but diffusely tender, mildly. No rebound or guarding. Normoactive bowel sounds. No organomegaly or masses were appreciated. No hernias or bruits were noted.  EXTREMITIES:  Without clubbing, cyanosis, or edema. Pulses were 2+ bilaterally.  SKIN:  Warm and dry without rash or lesions.  NEUROLOGIC:  Cranial nerves II through XII grossly intact. Deep tendon reflexes were symmetric. Motor and sensory exam is nonfocal.   PSYCHIATRIC:  Revealed a patient who is alert and oriented to person, place, and time. She was cooperative and used good judgment.   LABORATORY DATA:  Urinalysis was essentially unremarkable. White count was 7.1 with a hemoglobin of 12.5. Platelets were 140. Glucose was 86 with a BUN of 32, creatinine  of 2.31 with a GFR of 27, potassium of 3.4.   ASSESSMENT:  1.  Acute renal failure.  2.  Dehydration.  3.  Intractable nausea and vomiting.  4.  Thrombocytopenia. 5.  A history of gastric bypass surgery.  6.  Anxiety/depression.  PLAN:  The patient will be admitted the floor with IV fluids and empiric IV antibiotics. We will send off a urine culture. We will continue her Bentyl and add Zofran and Reglan as needed for nausea and vomiting. We will obtain abdominal ultrasound as well as a GI consult. Follow up routine labs in the morning after hydration. Continue Cymbalta for now. We will check her thyroid status. Further treatment and evaluation will depend upon the patient's progress.  TOTAL TIME SPENT ON THIS PATIENT:  50 minutes.   ____________________________ Leonie Douglas Doy Hutching, MD jds:jm D: 06/15/2013 15:51:52 ET T: 06/15/2013 17:03:59 ET JOB#: [id number]  cc: Leonie Douglas. Doy Hutching, MD, <Dictator> Dinah Lupa Lennice Sites MD ELECTRONICALLY SIGNED 06/15/2013 17:25

## 2015-02-27 NOTE — Op Note (Signed)
PATIENT NAME:  Lindsay [name], Lindsay [name] MR#:  [id number] DATE OF BIRTH:  [birth year]  DATE OF PROCEDURE:  06/21/2013  PREOPERATIVE DIAGNOSES: 1.  Breast cancer. 2.  Gallstone pancreatitis.   3.  Acute renal failure.   POSTOPERATIVE DIAGNOSES: 1.  Breast cancer. 2.  Gallstone pancreatitis. 3.  Acute renal failure.   PROCEDURE PERFORMED: Insertion of right femoral triple lumen catheter with ultrasound guidance.   PROCEDURE PERFORMED BY:  Katha Cabal, M.D.   DESCRIPTION OF PROCEDURE: The patient is in the Intensive Care Unit. She is critically ill. She is positioned supine. The right groin is shaved and then prepped and draped in a sterile fashion. Ultrasound is placed in a sterile sleeve. Common femoral vein is identified. It is echolucent and compressible indicating patency. Image is recorded for the permanent record. Micropuncture needle is then inserted into the vein. A microwire followed by a micro sheath, J-wire followed by the dilator and the triple-lumen catheter is then advanced over the wire. All three lumens aspirate and flush easily. It is secured to the skin of the thigh with 0 silk. There are no immediate complications.    ____________________________ Katha Cabal, MD ggs:nts D: 06/21/2013 18:58:08 ET T: 06/22/2013 02:43:18 ET JOB#: [id number]  cc: Katha Cabal, MD, <Dictator> Katha Cabal MD ELECTRONICALLY SIGNED 06/26/2013 7:32

## 2015-02-27 NOTE — Discharge Summary (Signed)
PATIENT NAME:  Lindsay [name], Lindsay [name] MR#:  [id number] DATE OF BIRTH:  10-03-1956  DATE OF ADMISSION:  06/28/2013 DATE OF DISCHARGE:  07/04/2013  PRIMARY DOCTOR:  None local.  DISPOSITION:  [facility].   CONSULTATIONS:  Neurology consult with Dr. Valora Corporal.   DISCHARGE DIAGNOSES:  1.  Dizziness and falls secondary to orthostatic hypotension.  2.  Toxic encephalopathy.  3.  A history of breast cancer.  4.  Peripheral neuropathy.  5.  Chronic pain syndrome.  6.  Gastroesophageal reflux disease.  7.  Chronic diastolic heart failure.  8.  Chronic obstructive pulmonary disease.   DISCHARGE MEDICATIONS:  1.  Celexa 20 mg p.o. daily.  2.  Cymbalta 30 mg 2 capsules in the morning.  3.  Vitamin B12 1000 mcg p.o. daily.  4.  Anastrozole 1 mg p.o. daily.  5.  Vitamin C 500 mg p.o. daily.  6.  Aspirin 81 mg daily. 7.   Vitamin D 2000 units daily.  8.  .  9.  Fluticasone 550 mcg p.o. daily.  10.  Omeprazole 20 mg daily. 11.  Simethicone 50 mg every 6 hours p.r.n.  12.  Fludrocortisone. Right now the dose will be increased to 0.2 mg daily. 13.  Nasonex 1 mg p.o. daily. 14.  Neurontin decreased to 400 mg p.o. t.i.d.  15.  Oxycodone 5 mg every 6 hours p.r.n.  Discontinue the olanzapine because of orthostatic hypotension.   HOSPITAL COURSE: 1.  The patient is a 59 year old female patient with a history of breast cancer, status post lumpectomy, gastric bypass, admitted to the hospitalist service for gallstone pancreatitis and discharged on August 19 to home. The patient's hospital course on August 19 was  significant for orthostatic hypotension, and she was started on fludrocortisone and the patient had incidental finding of pneumothorax for which she had a chest tube insertion in the ER by Dr. Burt Knack and the patient had this chest tube placed on August 22nd in the ER, but the patient is admitted to medical service because she was just discharged on August 19th for her gallstone pancreatitis. So her  chest tube was removed anyway the following day by Dr. Burt Knack. Her lungs are well expanded. The patient admitted for orthostatic hypotension and fall. She was on fludrocortisone, which was continued as well. Other labs on admission showed slight dehydration, creatinine 1.59, hypokalemia with potassium 2.8 and her magnesium was also low, so she was admitted for electrolyte imbalance with dehydration and a fall secondary to orthostatic hypotension and received fluids about 2 liters and after that 75 mL of normal saline continued for 10 hours. The patient felt better and her potassium is replaced. Her chest tube was discontinued and the patient thought to have spontaneous pneumothorax.  2.  Toxic encephalopathy. The patient was taking pain medication and also on Neurontin. She had a like-staring episode, which were thought to be seizure-related and. The patient was seen by Dr. Valora Corporal. And the patient had an episode of blank and staring spells without any seizure activity like in terms of tongue biting, and she had a CT which was unremarkable and she also had further testing for B12 ammonia, EEG. Her ammonia level is normal. B12 levels are therapeutic. Her EEG did not show any evidence of seizures. And the patient's LFTs were within normal limits, so neurologist recommended to cut down the Neurontin and encephalopathies were thought to be secondary to medication-induced and also recommended to discontinue Celexa, avoid tramadol. The patient can get oxycodone only  and her Neurontin is at 400 mg t.i.d. She can follow up with Dr. Tamala Julian in 4 to 6 weeks. She does have some myoclonus and thus Keppra has been started by him at 250 mg b.i.d. and peripheral neuropathy likely secondary to chemotherapy.  3.  Gastroesophageal reflux disease. Continue proton pump inhibitors.  4.  IV iron deficiency anemia. Continue iron supplements.   The patient will be going to rehab, seen by Physical Therapy and they recommended  short-term rehab.  TIME SPENT ON DISCHARGE PREPARATION:  More than 30 minutes.   ____________________________ Epifanio Lesches, MD sk:jm D: 07/04/2013 10:45:16 ET T: 07/04/2013 11:25:34 ET JOB#: [id number]  cc: Epifanio Lesches, MD, <Dictator> Epifanio Lesches MD ELECTRONICALLY SIGNED 07/19/2013 22:43

## 2015-02-27 NOTE — H&P (Signed)
PATIENT NAME:  Lindsay [name], Lindsay [name] MR#:  [id number] DATE OF BIRTH:  February 14, 1956  DATE OF ADMISSION:  06/28/2013  PRIMARY CARE PHYSICIAN: Dr. Harriet Masson in [place].  CHIEF COMPLAINT: Fall.   HISTORY OF PRESENTING ILLNESS: A 59 year old Caucasian female patient with prior history of breast cancer status post lumpectomy and gastric bypass, who was recently admitted to the hospitalist service for gallstone pancreatitis, discharged home on 06/25/2013. The patient returned home. She does not have any abdominal pain at this time. She does complain of chronic diarrhea, which she has had over 4 years. This is so severe that she gets dehydrated repeatedly. The patient has had problems with orthostatic hypotension during the recent hospital stay, was started on fludrocortisone. She did stand up, tried to walk and fell down, feeding lightheaded, did not loose consciousness. Did not have any seizures. She did have some visitors at home. Was brought to the Emergency Room. In the Emergency Room, the patient was found to be dehydrated, orthostatic positive, and a chest x-ray showed incidental finding of a tension pneumothorax, for which a chest tube has been placed by Dr. Burt Knack of surgery. This seems to have re-expanded well on imaging study, as per him. As the patient was on the hospitalist service, he has requested that the patient be readmitted. He feels like he will be removing the chest tube tomorrow if there is no residual pneumothorax.   The patient presently complains of significant pain all over her body, which is chronic secondary to neuropathy, as per the patient. She also has pain at the chest tube site. She was given 4 mg of morphine in the Emergency Room, which almost made her unresponsive, but presently the patient is more awake, asking for pain medications again.   The patient was seen by surgery during the prior hospital stay. Was asked to follow up as outpatient for elective cholecystectomy for gallstone  pancreatitis.   The patient has had recurrent falls over the past few years where she gets dehydrated, lightheaded and falls down, but has not had any episodes of syncope.   PAST MEDICAL HISTORY: 1.  Breast cancer status post lumpectomy.  2.  Anxiety, depression.  3.  Peripheral neuropathy.  4.  Chronic pain syndrome, on high-dose narcotics.  5.  Reflux.  6.  History of atrial fibrillation.  7.  History of congestive heart failure, with EF of 45%.  8.  Status post hysterectomy.  9.  Gastric bypass in 2005.  10.  Chronic diarrhea.  11.  Chronic orthostatic hypotension.  12.  Nephrolithiasis with hydronephrosis.  13.  Tobacco abuse.  14.  Anemia of chronic disease.   ALLERGIES: No known drug allergies.   SOCIAL HISTORY: The patient lives alone. Ambulates with a cane. He used to smoke in the past. Does not smoke anymore. No illicit drugs. No alcohol.   CODE STATUS: Full code.   FAMILY HISTORY: Positive for diabetes and stroke.   REVIEW OF SYSTEMS:  CONSTITUTIONAL: Complains of fatigue and weight loss.  EYES: No blurred vision, pain or redness.  ENT:  No tinnitus, ear pain, hearing loss.  RESPIRATORY: No cough, wheezing or hemoptysis.  Did complain of some mild chest pain, presently has pain at the chest tube site.  CARDIOVASCULAR: Had lightheadedness and presyncope. No edema, chest pain.  GASTROINTESTINAL:  Has chronic nausea, vomiting, diarrhea.  ENDOCRINE: No polyuria, nocturia or thyroid problems.  HEMATOLOGIC AND LYMPHATIC: Has anemia of chronic disease. No easy bruising, bleeding.  INTEGUMENTARY: No acne, rash, lesions.  MUSCULOSKELETAL:  Has arthritis.  NEUROLOGIC: Has neuropathy. No dysarthria or seizures.  PSYCHIATRIC: Has anxiety and depression.   HOME MEDICATIONS: Include:  1.  Anastrozole 1 mg oral daily.  2.  Aspirin 81 mg daily.  3.  Bentyl 10 mg oral 4 times a day.  4.  Citalopram 20 mg daily.  5.  Lorazepam 0.5 mg b.i.d. p.r.n.  6.  Cymbalta 30 mg oral once  a day.  8.  Fludrocortisone 0.4 mg oral once a day.  9.  Fluticasone nasal spray to each nostril once a day.  10.  Gabapentin 300 mg 2 capsules oral 3 times a day.  11.  Loperamide 2 mg oral 4 times a day as needed.  12.  Nystatin topical to affected area 2 times a day.  13.  Olanzapine 2.5 mg oral once a day.  14.  Omeprazole 20 mg oral once a day.  15.  Oxycodone 5 mg oral every 6 hours as needed for pain.  16.  Simethicone 80 mg oral every 6 hours as needed for gas relief.  17.  Vitamin B12, 1000 mg oral daily.  18.  Vitamin C 500  mg daily.  19.  Vitamin D3, 1000 International Units oral once a day.   PHYSICAL EXAMINATION: VITAL SIGNS: Temperature 98.3, pulse of 72, blood pressure 119/90. On checking orthostatics, her lying blood pressure was 152/101 and on sitting this dropped to 100/75 with lightheadedness.  GENERAL: Frail, cachectic, malnourished Caucasian female patient, lying in bed in significant distress secondary to her chest pain from the chest tube. Is tearful and anxious, restless.  HEENT: Atraumatic, normocephalic. Oral mucosa dry and pink. No oral ulcers or thrush. External ears and nose normal. Pallor positive. No icterus. Pupils bilaterally equal and reactive to light.  NECK: Supple. No thyromegaly. No palpable lymph nodes. Trachea midline. No carotid bruit or JVD.  CARDIOVASCULAR: S1, S2 without any murmurs. Peripheral pulses 2+. No edema.  RESPIRATORY: Normal work of breathing. Shallow breathing secondary to the chest tube. Clear to auscultation.  GASTROINTESTINAL: Soft abdomen, nontender. Bowel sounds present. No hepatosplenomegaly palpable.  SKIN: Warm and dry. No petechiae, rash, ulcers.  MUSCULOSKELETAL: No joint swelling, redness, effusion of the large joints.  NEUROLOGICAL: Decreased sensations in the lower extremities. Motor strength 5/5.  LYMPHATIC: No cervical, supraclavicular lymphadenopathy.   LABORATORY, DIAGNOSTIC AND RADIOLOGIC DATA: Show glucose 81, BUN  4, creatinine 1.52, sodium 142, potassium 2.8, chloride 112. GFR of 36. AST, ALT, alkaline phosphatase, bilirubin normal. Troponin less than 0.02. WBC 6.1, hemoglobin 8.6, platelets of 307.   Urinalysis shows no bacteria.   Chest x-ray shows left pneumothorax. Mild component of tension.   EKG shows normal sinus rhythm with Q waves in V1, V2. No change compared to prior EKG with nonspecific ST-T wave changes.   ASSESSMENT AND PLAN: 1.  Left pneumothorax status post chest tube. The patient is being followed by surgery, Dr. Burt Knack. Presently has no acute respiratory failure. Hopefully, this can be removed in the next 1 to 2 days. Likely spontaneous and could also be from her repeated falls and trauma. No rib fractures.  2.  Lightheadedness and fall. The patient has had recurrent problems with falls and orthostatic syncope, lightheadedness. Is on fludrocortisone. Presently seems dehydrated. We will hydrate her and repeat orthostatics, in the morning.  3.  Anemia of chronic disease, worsening. The patient's baseline hemoglobin seems to be around 11 to 10, but presently is at 8.6. We will check stool occult with her history of chronic diarrhea  for 4 years. We will consult GI. She seems to be getting significantly dehydrated with his diarrhea.  4.  Recent gallstone pancreatitis. No abdominal pain at this time. Will need elective cholecystectomy as outpatient.  5.  Acute renal failure over chronic kidney disease. Continue IV fluids.  6.  Deep venous thrombosis prophylaxis. We will not start her on heparin or Lovenox until a GI bleed is ruled out.   CODE STATUS: Full code.   Time spent today on this case was 60 minutes.      ____________________________ Leia Alf Jontay Maston, MD srs:dmm D: 06/28/2013 20:30:00 ET T: 06/28/2013 21:07:55 ET JOB#: [id number]  cc: Alveta Heimlich R. Marieta Markov, MD, <Dictator> Neita Carp MD ELECTRONICALLY SIGNED 06/28/2013 22:05

## 2015-02-27 NOTE — Consult Note (Signed)
CC: abd pain and tenderness.  EGD showed no evidence of any inflammation in esophagus, stomach, or jejunal loops that I can see.  Recommend surgical consult for possible gall baldder removal.  Electronic Signatures: Manya Silvas (MD)  (Signed on 10-Aug-14 11:19)  Authored  Last Updated: 10-Aug-14 11:19 by Manya Silvas (MD)

## 2015-02-27 NOTE — Consult Note (Signed)
PATIENT NAME:  Lindsay, [name] MR#:  [id number] DATE OF BIRTH:  04/07/1956  DATE OF CONSULTATION:  06/16/2013  ADMITTING PHYSICIAN: [facility]  CONSULTING PHYSICIAN:  Rodena Goldmann III, MD PRIMARY CARE PHYSICIAN: Not a local physician   CHIEF COMPLAINT: Abdominal pain, nausea and vomiting.   BRIEF HISTORY: Lindsay [name] is a 59 year old woman with severe nausea and vomiting over the last several days. She says for the last 5 to 6 days she has been unable to keep anything on her stomach. She presented to the Emergency Room with a slightly elevated creatinine suggestive of acute underlying renal failure. The patient has a history of gastric bypass surgery in 2005 with a 150-pound weight loss associated with that procedure.  She has also a history of breast cancer with lumpectomy. She was mildly hypotensive in the Emergency Room, responded well to fluids, and was admitted with probable marginal ulcer of the gastric bypass. Laboratory values on admission demonstrated abnormal creatinine at 2.31. Chloride was elevated at 112. Albumin was 3.0. Liver function studies were unremarkable. Troponin was not elevated. Her white blood cell count was normal, but her platelet count was 140,000. Ultrasound demonstrated multiple gallstones without evidence of gallbladder wall thickening. Because of her symptoms, she underwent EGD this morning which did not demonstrate any evidence of mucosal irregularity or lesion in the stomach or visible small bowel. The Surgical Service was consulted for evaluation of her biliary tract disease.   She denies a history of previous gallbladder problems. She denies history of hepatitis or yellow jaundice. She denies a history of peptic ulcer disease or diverticulitis. She has had previous gastric bypass and lumpectomy but no other abdominal surgery.   CURRENT MEDICATIONS: Include Bentyl 10 mg p.o. 4 times day, citalopram 20 mg once a day, clonazepam 0.5 mg twice a day, Cymbalta 30 mg twice a  day, omeprazole 40 mg once a day, Percocet 5/325 every 4 to 6 hours p.r.n. pain.   ALLERGIES:  She is not allergic to any medications.   SOCIAL HISTORY: She is not a cigarette smoker and does not drink any significant alcohol.   REVIEW OF SYSTEMS: Is outlined in the admission note and reviewed. I am in agreement with those findings.  PHYSICAL EXAMINATION: GENERAL: She is lying covered up in bed complaining of being cold.  VITAL SIGNS: Blood pressure 128/84, heart rate 72 and regular. She is afebrile.  HEAD, EYES, EARS, NOSE, THROAT: Unremarkable. She has no scleral icterus. No pupillary abnormalities. No facial deformities.  NECK: Supple, nontender with no adenopathy. Midline trachea.  CHEST: Clear with no adventitious sounds. She has normal pulmonary excursion.  CARDIAC: No murmurs or gallops to my ear, and she seems to be in normal sinus rhythm.  ABDOMEN: Soft with some mild upper abdominal tenderness. I do think I can feel her liver edge on the right side. She has well-healed midline and upper abdominal scars for her bypass surgery. She has active bowel sounds. No hernias are noted.  LOWER EXTREMITIES: Reveals no edema. Full range of motion. No deformities.  PSYCHIATRIC: Reveals some mild anxiety and agitation but normal orientation.   IMPRESSION: This woman probably has symptomatic biliary tract disease, although she seems to be much more symptomatic than her ultrasound might suggest. Clinically, she does not have any evidence of acute cholecystitis. She has multiple comorbidities including her acute renal failure, her thrombocytopenia, hypoalbuminemia. She would not be a good candidate for surgery at the present time, and there are certainly no acute surgical indications.  RECOMMENDATIONS: I would recommend treating her symptomatically, getting her through this current episode, then making a decision regarding her risk assessment for possible surgical intervention. This plan has been  discussed with her in detail. She is very eager to have some sort of intervention. I will talk with her primary physician at the first opportunity. Dr. Randa Lynn will be coming on service tomorrow, and I will make sure he follows up on this patient's progress.   ____________________________ Rodena Goldmann III, MD rle:cb D: 06/16/2013 15:04:59 ET T: 06/16/2013 20:26:46 ET JOB#: [id number]  cc: Rodena Goldmann III, MD, <Dictator> Rodena Goldmann MD ELECTRONICALLY SIGNED 06/24/2013 22:33

## 2015-02-27 NOTE — Consult Note (Signed)
PATIENT NAME:  Lindsay [name], Lindsay [name] MR#:  [id number] DATE OF BIRTH:  Dec 31, 1955  DATE OF CONSULTATION:  06/18/2013  CONSULTING PHYSICIAN:  S.G. Jamal Collin, MD  REASON FOR CONSULTATION: Second opinion regarding her abdominal pain.   HISTORY OF PRESENT ILLNESS: This 59 year old female was admitted on 06/15/2013 for 2-week history of intractable nausea and vomiting and some abdominal pain. Since admission, the patient has been continuing to have the symptoms of some nausea and vomiting, but her primary complaint now is that of pain that is located in the high epigastric region and is more constant. She has had no history of any fever or chills and denies any previous history of any abdominal symptoms.   PAST MEDICAL HISTORY: Includes that of right breast cancer for which she had a lumpectomy and reportedly is on antihormonal treatment at this time. History of anxiety and depression, peripheral neuropathy, gastroesophageal reflux, history of atrial fibrillation, history of congestive heart failure.  SURGICAL HISTORY: Includes hysterectomy, gastric bypass in 2005. It appears the patient also had a feeding tube placed for reasons I am not quite sure.   HOSPITAL COURSE THUS FAR: The patient was also noted to be mildly hypotensive and in acute renal failure on admission, which has improved since admission. She has had GI evaluation and has had an endoscopy and a gastric emptying study, both nonrevealing. An ultrasound has been performed showing the presence of gallstones, without any features suggestive of acute process. No evidence of common duct dilatation. Her lab values have shown normal liver functions except for mild elevation of her SGPT and a low albumin. Her lipase is noted to be elevated at over 1000.   EXAMINATION:  GENERAL: The patient is alert and oriented. Does not seem to be in any acute distress.  EYES: Sclerae are nonicteric.  NECK: Supple. No nodes or masses palpable.  LUNGS: Clear to auscultation  and percussion.  HEART: Sinus rhythm without any murmurs at this time.  ABDOMEN: Reveals some mild tenderness in the epigastric region close to the xiphoid area, but the rest of the abdomen is soft. Good bowel sounds. Healed incisions and port sites from prior surgery are noted. No evidence of hernia.   IMPRESSION: Her laboratory data and her prior notes from GI and from Dr. Pat Patrick from surgical service have been noted. The patient obviously has pancreatitis accounting for her pain at this time. The patient denies any history of alcohol abuse; however, she was not very forthcoming on this. I suspect pancreatitis may have been from her gallstones, but at this particular point, there is no suggestion that she has an acute process in her gallbladder. I feel it would be prudent to wait and allow her pancreatitis and all her other conditions to resolve fully prior to undertaking cholecystectomy. In essence, I agree with the recommendation by Dr. Pat Patrick that surgery is not indicated at the present time and her pancreatitis does need to be treated more aggressively. This was discussed in full with the patient, and she seems to understand, and her questions were answered.   Thank you for allowing me to evaluate and help in the care of this patient.   ____________________________ S.Robinette Haines, MD sgs:OSi D: 06/19/2013 06:30:00 ET T: 06/19/2013 06:54:54 ET JOB#: [id number]  cc: Synthia Innocent. Jamal Collin, MD, <Dictator> [facility] Robinette Haines MD ELECTRONICALLY SIGNED 06/19/2013 8:03

## 2015-02-27 NOTE — Consult Note (Signed)
PATIENT NAME:  Lindsay [name], FIFE MR#:  [id number] DATE OF BIRTH:  Jun 30, 1956  DATE OF CONSULTATION:  06/15/2013  CONSULTING PHYSICIAN:  Manya Silvas, MD  The patient is a 59 year old black female who has had multiple medical problems. She was admitted after 2 weeks of epigastric and right upper quadrant abdominal pain with syncope and dehydration. I was asked to see her in consultation.   She has had 2 weeks of abdominal pain, nausea and vomiting, unable to keep anything down. She points to the epigastric and right upper quadrant area as the areas of pain. She has actually not been able to eat any food for 5 days because could not keep it down.   The patient has had C. difficile in the past. She was treated with Flagyl and failed this and was given vancomycin, which got rid of the C. difficile. This was in January 2013.   REVIEW OF SYSTEMS: She denies any hematemesis. No bright red blood per rectum. She has had somewhat dark stools. She was checked in the office by her doctor. Rectal exam was negative. She denies any dysphagia or heartburn. She does have epigastric and right upper quadrant pain. She does have chronic cough with clear phlegm. She generally has a bowel movement after eating, but no nocturnal bowel movements. She denies any peptic ulcer disease. The patient had gastric bypass surgery in 2005 and since then has lost 150 pounds. She denies fever. She denies dysuria or hematuria. She does have pain all over that she attributes to her neuropathy.   PAST MEDICAL HISTORY: 1.  Atrial fibrillation once.  2.  Congestive heart failure.  3.  Status post hysterectomy. 4.  Previous breast cancer, status post lumpectomy.  5.  Anxiety, depression.  6.  Peripheral neuropathy.  7.  Possible GERD. 8.  Hysterectomy.  9.  Gastric bypass surgery in 2005.   MEDICATIONS ON ADMISSION: Klonopin 0.5 mg p.o. b.i.d., Cymbalta 30 mg p.o. daily, Prilosec 40 mg p.o. daily, Percocet 5/325 one q.6 hours p.r.n.  for pain, vitamin B12 at 1000 mcg daily and Bentyl 10 mg q.i.d.   ALLERGIES: No known drug allergies.   HABITS: She has not smoked since 1995. Does not drink alcohol.   FAMILY HISTORY: Positive for diabetes and strokes.  PHYSICAL EXAMINATION: GENERAL: Black female in no acute distress, looks older than stated age.  HEENT: Sclerae anicteric. Conjunctivae negative. The head is atraumatic. Tongue shows questionable thrush. The trachea is in the midline.  CHEST: Clear.  HEART: No murmurs or gallops I can hear.  ABDOMEN: Tender, especially in the right abdomen, epigastric area. Palpable fullness. Bowel sounds are present. Her abdomen shows old scars present.  EXTREMITIES: No edema.  NEUROLOGIC: The patient is alert and oriented, answers questions appropriately.   LABORATORY AND RADIOLOGICAL DATA: Glucose 86, BUN 32, creatinine 2.3, sodium 143, potassium 3.4, chloride 112, CO2 of 22, calcium 8.9, total protein 6.4, albumin 3, total bilirubin 0.7, alkaline phosphatase 127, SGOT 94, SGPT 43. Troponin less than 0.02. White blood count 7.1, hemoglobin 12.5, hematocrit 37, platelet count 140. Urinalysis shows 1+ blood, small amount of protein.   KUB is nonspecific. The ultrasound shows multiple gallstones, mild gallbladder wall thickening, no sonographic evidence otherwise of acute cholecystitis. Liver demonstrates increased echotexture consistent possibly with fatty infiltrative changes. Common bile duct is 7.2 mm. Echotexture of the kidneys is consistent with medical renal disease and a small amount of ascites present.   ASSESSMENT: A patient unable to keep things down at  least 5 days and more like 2 weeks with abdominal pain, dry heaves and vomiting and dehydration. She has had gastric bypass surgery and it is possible she could have ulcerations. Also possible that her gallbladder is playing a significant role in this given her stones, slight thickening of the wall, her tenderness in the right upper  quadrant.   RECOMMENDATIONS: Agree with continued hydration. IV Protonix will be a good idea. I plan to take a look down  her stomach tomorrow and if that is negative, get a surgical consult for consideration for gallbladder removal. The patient has home health people coming in from 10 to 2 daily. She is generally very weak and unable to do only minimal things herself. I will follow with you.   ____________________________ Manya Silvas, MD rte:jm D: 06/15/2013 20:01:23 ET T: 06/15/2013 20:19:54 ET JOB#: [id number]  cc: Manya Silvas, MD, <Dictator> Manya Silvas MD ELECTRONICALLY SIGNED 07/07/2013 16:23

## 2015-02-27 NOTE — Consult Note (Signed)
Referring Physician:  Alba Destine :   Primary Care Physician:  Alba Destine : [facility], [address] [place], [place] [address], [place] [phone]  Reason for Consult: Admit Date: 29-Jun-2013  Chief Complaint: cough  Reason for Consult: seizure   History of Present Illness: History of Present Illness:   59 yo RHD F presents to [facility] due to cough and is noted to have a pleural effusion needing a chest tube.  Pt was progressing as normal but yesterday was noted to have a blank stare and less responsive.  Pt did not respond to Narcan.  Today, pt has not been getting narcotics but is still less responsive than normal.  PT witnessed an episode of blanking out as did I.  Pt immediately came back around and there was not shaking activity, tongue biting or incontinence.    ROS:  General denies complaints   HEENT no complaints   Lungs no complaints   Cardiac no complaints   GI no complaints   GU no complaints   Musculoskeletal no complaints   Extremities no complaints   Skin no complaints   Neuro no complaints   Endocrine no complaints   Psych no complaints   Past Medical/Surgical Hx:  Neuropathy:   GERD - Esophageal Reflux:   Depression:   Anxiety:   Chemotherapy:   CHF:   Atrial Fibrillation:   Breast Cancer:   Hysterectomy - Total:   Breast Surgery:   gastric bypass:   Past Medical/ Surgical Hx:  Past Medical History as above   Past Surgical History as above plus recent chest tube   Home Medications: Medication Instructions Last Modified Date/Time  oxyCODONE 5 mg oral tablet 1 tab(s) orally every 6 hours x 3 days, As Needed - for Pain 22-Aug-14 19:08  anastrozole 1 mg oral tablet 1 tab(s) orally once a day 22-Aug-14 19:08  Cymbalta 30 mg oral delayed release capsule 1 cap(s) orally once a day (in the morning) for 2 weeks, then increase to 2 capsules every morning 22-Aug-14 19:08  citalopram 20 mg oral tablet 1 tab(s) orally  once a day 22-Aug-14 19:08  clonazePAM 0.5 mg oral tablet 1 tab(s) orally 2 times a day, As Needed - for Anxiety, Nervousness 22-Aug-14 19:08  Bentyl 10 mg oral capsule 1 cap(s) orally 4 times a day (before meals and at bedtime) 22-Aug-14 19:08  Vitamin B-12 1000 mcg oral tablet 1 tab(s) orally once a day 22-Aug-14 19:08  Vitamin C 500 mg oral tablet 1 tab(s) orally once a day 22-Aug-14 19:08  Aspirin Enteric Coated 81 mg oral delayed release tablet 1 tab(s) orally once a day 22-Aug-14 19:08  Vitamin D3 1000 intl units oral capsule 1 cap(s) orally once a day 22-Aug-14 19:08  desloratadine 5 mg oral tablet 1 tab(s) orally once a day 22-Aug-14 19:08  fludrocortisone 0.1 mg oral tablet 1 tab(s) orally once a day 22-Aug-14 19:08  fluticasone nasal 50 mcg/inh nasal spray 1 spray(s) into each nostril once a day. 22-Aug-14 19:08  gabapentin 300 mg oral capsule 2 caps (668m) orally 3 times a day. 22-Aug-14 19:08  OLANZapine 2.5 mg oral tablet 1 tab(s) orally once a day 22-Aug-14 19:08  omeprazole 20 mg oral delayed release capsule 1 cap(s) orally once a day 22-Aug-14 19:08  simethicone 80 mg oral tablet, chewable 1 tab(s) orally every 6 hours as needed for gas relief. 22-Aug-14 19:08  loperamide 2 mg oral capsule 1 cap(s) orally 4 times a day, As Needed 22-Aug-14 19:08  nystatin topical 100000 units/g topical ointment Apply topically to affected area 2 times a day 22-Aug-14 19:08   Allergies:  No Known Allergies:   Allergies:  Allergies NKDA   Social/Family History: Lives With: children  Living Arrangements: apartment  Social History: + tob, no EtOH, no illicits  Family History: n/c   Vital Signs: **Vital Signs.:   26-Aug-14 04:56  Vital Signs Type Routine  Temperature Temperature (F) 98.4  Celsius 36.8  Temperature Source oral  Pulse Pulse 69  Respirations Respirations 18  Systolic BP Systolic BP 093  Diastolic BP (mmHg) Diastolic BP (mmHg) 93  Mean BP 111  Pulse Ox % Pulse Ox %  97  Pulse Ox Activity Level  At rest  Oxygen Delivery Room Air/ 21 %   Physical Exam: General: appears older than stated age and quite discheveled, nl weight  HEENT: normocephalic, sclera nonicteric, oropharynx clear  Neck: supple, no JVD, no bruits  Chest: CTA B, no wheezing, good movement  Cardiac: RRR, no murmurs, no edema, 2+ pulses  Extremities: no C/C/E, FROM   Neurologic Exam: Mental Status: confused but then orients x 3, mild dysarthria, nl language  Cranial Nerves: PERRLA, EOMI, nl VF, face symmetric, tongue midline, shoulder shrug equal  Motor Exam: 4/5 B, moderate myoclonus noted, nl tone  Deep Tendon Reflexes: 1+/4 B, down going plantars  Sensory Exam: decreased pin and temp in stocking and glove pattern  Coordination: body ataxia noted   Lab Results: Hepatic:  23-Aug-14 04:52   Bilirubin, Total 0.3  Alkaline Phosphatase 81  SGPT (ALT)  10  SGOT (AST) 15  Total Protein, Serum  5.1  Albumin, Serum  2.5  TDMs:  26-Aug-14 05:05   Dilantin, Serum  3.4 (Result(s) reported on 02 Jul 2013 at 06:17AM.)  Routine Chem:  22-Aug-14 14:48   Result Comment potassium;ast - Slight hemolysis, interpret results with  - caution.  Result(s) reported on 28 Jun 2013 at 03:29PM.  23-Aug-14 04:52   Magnesium, Serum  1.6 (1.8-2.4 THERAPEUTIC RANGE: 4-7 mg/dL TOXIC: > 10 mg/dL  -----------------------)  25-Aug-14 05:03   Iron Binding Capacity (TIBC)  116  Unbound Iron Binding Capacity 79  Iron, Serum  37  Iron Saturation 32 (Result(s) reported on 01 Jul 2013 at 12:14PM.)  26-Aug-14 05:05   Glucose, Serum 70  BUN  4  Creatinine (comp) 1.18  Sodium, Serum 144  Potassium, Serum  3.3  Chloride, Serum  114  CO2, Serum 25  Calcium (Total), Serum  8.2  Anion Gap  5  Osmolality (calc) 282  eGFR (African American) >60  eGFR (Non-African American)  52 (eGFR values <36m/min/1.73 m2 may be an indication of chronic kidney disease (CKD). Calculated eGFR is useful in patients with  stable renal function. The eGFR calculation will not be reliable in acutely ill patients when serum creatinine is changing rapidly. It is not useful in  patients on dialysis. The eGFR calculation may not be applicable to patients at the low and high extremes of body sizes, pregnant women, and vegetarians.)  Cardiac:  22-Aug-14 14:48   CK, Total 57  CPK-MB, Serum 0.8 (Result(s) reported on 28 Jun 2013 at 03:23PM.)  Troponin I < 0.02 (0.00-0.05 0.05 ng/mL or less: NEGATIVE  Repeat testing in 3-6 hrs  if clinically indicated. >0.05 ng/mL: POTENTIAL  MYOCARDIAL INJURY. Repeat  testing in 3-6 hrs if  clinically indicated. NOTE: An increase or decrease  of 30% or more on serial  testing suggests a  clinically important change)  Routine  UA:  22-Aug-14 17:14   Color (UA) Yellow  Clarity (UA) Clear  Glucose (UA) Negative  Bilirubin (UA) Negative  Ketones (UA) Negative  Specific Gravity (UA) 1.005  Blood (UA) Negative  pH (UA) 5.0  Protein (UA) Negative  Nitrite (UA) Negative  Leukocyte Esterase (UA) Negative (Result(s) reported on 28 Jun 2013 at 05:47PM.)  RBC (UA) 1 /HPF  WBC (UA) 5 /HPF  Bacteria (UA) NONE SEEN  Epithelial Cells (UA) <1 /HPF  Mucous (UA) PRESENT  Hyaline Cast (UA) 3 /LPF (Result(s) reported on 28 Jun 2013 at 05:47PM.)  Routine Hem:  24-Aug-14 03:36   WBC (CBC) 6.1  RBC (CBC)  3.05  Hemoglobin (CBC)  8.7  Hematocrit (CBC)  26.1  Platelet Count (CBC) 279  MCV 86  MCH 28.6  MCHC 33.4  RDW  15.1  Neutrophil % 64.3  Lymphocyte % 25.9  Monocyte % 9.1  Eosinophil % 0.7  Basophil % 0.0  Neutrophil # 3.9  Lymphocyte # 1.6  Monocyte # 0.6  Eosinophil # 0.0  Basophil # 0.0 (Result(s) reported on 30 Jun 2013 at [facility].)   Radiology Results: CT:    25-Aug-14 17:43, CT Head Without Contrast  CT Head Without Contrast   REASON FOR EXAM:    lethargic  COMMENTS:       PROCEDURE: CT  - CT HEAD WITHOUT CONTRAST  - Jul 01 2013  5:43PM     RESULT:  Technique: Helical noncontrasted 5 mm sectionswere obtained from   the skull base through the vertex.    Findings: Mild diffuse cortical and cerebellar atrophy is identified as   well as mild diffuse areas of low attenuation within the subcortical,   deep and periventricular white matter regions. There is not evidence of   intra-axial nor extra-axial fluid collections, acute hemorrhage, mass   effect, nor a depressed skull fracture. The visualized paranasal sinuses   and mastoid air cells are patent.  IMPRESSION:  Mild chronic and involutional changes without evidence of   acute abnormalities. If there is persistent clinical concern further   evaluation with MRI is recommended.  2. Comparison made to prior study dated 05/31/2012          Thank you for the opportunity to contribute to the care of your patient.        Verified By: Mikki Santee, M.D., MD   Radiology Impression: Radiology Impression: CT of head personally reviewed by me and shows mild old white matter changes   Impression/Recommendations: Recommendations:   previous notes reviewed by me reviewed by me d/w primary care physician   Encephalopathy-  these episodes are too quick to be a seizure and pt is already on some coverage for this;  myoclonus on exam points to hepatic/uremic etiology but this could be hypercapnea as well.  Multiple psychotrophic meds would worsen this as well as affect the liver. Severe peripheral neuropathy-  this is most likely secondary to chemotherapy for breast cancer EEG ordered check ammonia, LFTs, B12/folate, ESR hold pain meds as tolerated avoid tramadol decrease Neurotin to 454m TID d/c Celexa and other liver medications will follow  Electronic Signatures: SJamison Neighbor(MD)  (Signed 26-Aug-14 12:05)  Authored: REFERRING PHYSICIAN, Primary Care Physician, Consult, History of Present Illness, Review of Systems, PAST MEDICAL/SURGICAL HISTORY, HOME MEDICATIONS, ALLERGIES,  Social/Family History, NURSING VITAL SIGNS, Physical Exam-, LAB RESULTS, RADIOLOGY RESULTS, Recommendations   Last Updated: 26-Aug-14 12:05 by SJamison Neighbor(MD)

## 2015-02-27 NOTE — Consult Note (Signed)
I will sign off given normal post gastric bypass stomach.  Reconsult if needed.  Electronic Signatures: Manya Silvas (MD)  (Signed on 11-Aug-14 17:45)  Authored  Last Updated: 11-Aug-14 17:45 by Manya Silvas (MD)

## 2015-02-27 NOTE — Consult Note (Signed)
Chief Complaint:  Subjective/Chief Complaint case discusssed with Dr James Ivanoff, consult held, will see in GI as outpatient in fu.   Electronic Signatures: Loistine Simas (MD)  (Signed 24-Aug-14 12:52)  Authored: Chief Complaint   Last Updated: 24-Aug-14 12:52 by Loistine Simas (MD)

## 2015-02-27 NOTE — Op Note (Signed)
PATIENT NAME:  Lindsay [name], QUIZHPI MR#:  [id number] DATE OF BIRTH:  05/21/1956  DATE OF PROCEDURE:  06/28/2013  PREOPERATIVE DIAGNOSIS: Left pneumothorax.   POSTOPERATIVE DIAGNOSIS: Left pneumothorax.   PROCEDURE: Left chest tube placement.   SURGEON: Yeimi Debnam E. Burt Knack, MD.   ANESTHESIA: Local anesthetic.   INDICATIONS: This is a patient with multiple medical problems including breast cancer and pancreatitis who presents with shortness of breath and chest pain and a chest x-ray shows left pneumothorax.   PREPROCEDURE: We discussed the rationale for placing a chest tube. The options of observation, risks of bleeding, infection, recurrence of pneumothorax, and the expected postprocedure course. This was all reviewed for her. She understood and agreed to proceed.   FINDINGS: Chest tube placed without difficulty in the left chest. Portable chest x-ray is currently pending.   DESCRIPTION OF PROCEDURE: The patient was identified in the ED. She was placed in the right lateral recumbent position, prepped and draped in a sterile fashion. Informed consent had been obtained.   Local anesthetic was infiltrated in skin and subcutaneous tissues around the left anterolateral chest, and local anesthetic was infiltrated to the rib and over the top of the rib. An incision was then made and the 20-French trocar-type catheter was placed over the top of the 5th rib into the interspace and advanced with the trocar having been withdrawn. Air exuded and the lung re-expanded. The patient felt better immediately. The catheter was connected to the Pleur-evac  which was placed to (Dictation Anomaly)<<minus>>  20 cm of suction and then the catheter was sutured to the skin of the chest with the provided silk suture, and a sterile dressing was placed. The patient tolerated this procedure well. There were no complications.   A postoperative chest film was ordered.   ____________________________ Jerrol Banana Burt Knack,  MD rec:np D: 06/28/2013 18:29:14 ET T: 06/28/2013 22:37:12 ET JOB#: [id number]  cc: Jerrol Banana. Burt Knack, MD, <Dictator> Florene Glen MD ELECTRONICALLY SIGNED 06/29/2013 8:21

## 2015-03-01 NOTE — Consult Note (Signed)
PATIENT NAME:  Lindsay [name], Lindsay [name] MR#:  [id number] DATE OF BIRTH:  [birth year]  DATE OF CONSULTATION:  12/05/2011  REFERRING PHYSICIAN:   CONSULTING PHYSICIAN:  Lindsay Dawn. Terena Bohan, Lindsay [name]  REASON FOR REFERRAL: Recurrent Clostridium difficile.   HISTORY OF PRESENT ILLNESS: The patient is a 59 year old black female with known history of breast cancer who also has a history of cardiomyopathy and paroxysmal atrial fibrillation who initially came to the hospital on 01/25 with a syncopal episode. She has experienced chronic diarrhea ever since she received chemotherapy a few years ago for breast cancer. She was found to have Clostridium difficile infection this past December and was treated with Flagyl. According to the patient, the diarrhea never completely cleared and repeat stool study for C. difficile was not done after hospital discharge. The patient came in with recurrent abdominal pain which worsens with eating, was associated with some diarrhea. She has been on clear liquid diet and wants to eat solid food because she is hungry.   PAST MEDICAL HISTORY:  1. Right breast cancer requiring surgery.  2. Cardiomyopathy. 3. Neuropathy. 4. Paroxysmal atrial fibrillation. 5. Chronic diarrhea.   PAST SURGICAL HISTORY: Right breast lumpectomy.   FAMILY HISTORY: Notable for hypertension.   SOCIAL HISTORY: There is no smoking or alcohol use.   ALLERGIES: She has no known drug allergies.   MEDICATIONS AT HOME:  1. Tylenol. 2. Percocet. 3. Baby aspirin. 4. Nasal spray. 5. Anastrazole.  6. Coreg. 7. Citalopram.  8. Enalapril. 9. Iron. 10. Gabapentin. 11. Baclofen. 12. Imodium. 13. Lorazepam. 14. Omeprazole.   REVIEW OF SYSTEMS: No fevers or chills, but she did have some fatigue and generalized weakness and headaches. There is no weight loss. There is no blurred vision or hearing changes. There is some coughing, but no shortness of breath. There are no chest pains or palpitation. Gastrointestinal symptoms:  The patient has had some nausea, but no vomiting. She does complain of some abdominal pain. There is some diarrhea, but no gross hematochezia or melena.   PHYSICAL EXAMINATION:  GENERAL: The patient is in no acute distress.   VITALS: Vital signs this morning show a temperature 98.1, pulse 70, respirations 18, blood pressure 146/88, pulse oximetry 98% on room air.   HEENT: Normocephalic, atraumatic head. Pupils are equally reactive. Throat was clear.   NECK: Supple.   CARDIAC: Regular rhythm and rate without murmurs.   LUNGS: Lungs are clear bilaterally   ABDOMEN: Normoactive bowel sounds, soft. There is some mild diffuse tenderness. There is no rebound or guarding. There is no hepatomegaly. She has active bowel sounds.   EXTREMITIES: No clubbing, cyanosis, or edema.   LABORATORY, DIAGNOSTIC, AND RADIOLOGICAL DATA: Sodium 146, potassium 4.0, chloride 112, BUN 3, creatinine 0.86, CO2 24. Liver enzymes on admission were normal. CPK enzymes were normal. White count is 5.7, hemoglobin 10.1, platelet count 184. Stool is positive for C. difficile.   IMPRESSION/RECOMMENDATIONS: This is a patient with recurrent C. difficile. I agree with vancomycin. She will need vancomycin for a minimum of 10 to 14 days. I would make sure that C. difficile stool study is repeated after she finishes the treatment. I agree with probiotics. We should hold the antidiarrheal agents. The patient wants to have solid foods, so will order some solid food for her. However, the patient was made aware that her pain and diarrhea may worsen. If the pain does not improve, we should consider ordering a CT scan of the abdomen to see whether she has evidence of colitis on  CT scan. She had apparently a normal colonoscopy several years ago. If her diarrhea does not improve, we should consider repeating the colonoscopy also. I also discussed the role of fecal transplant if C. difficile continues to be either refractory or recurrent. I will  be out tomorrow, but will check back on Wednesday.   Thank you for the referral.    ____________________________ Lindsay Dawn. Candace Cruise, Lindsay [name] pyo:ap D: 12/05/2011 16:45:20 ET T: 12/05/2011 17:46:37 ET JOB#: [id number]  cc: Lindsay Dawn. Candace Cruise, Lindsay [name], <Dictator> Lindsay Dawn Homer Miller Lindsay [name] ELECTRONICALLY SIGNED 12/06/2011 8:53

## 2015-03-01 NOTE — Consult Note (Signed)
PATIENT NAME:  ERI, MCEVERS MR#:  [id number] DATE OF BIRTH:  December 15, 1955  DATE OF CONSULTATION:  10/22/2011  REFERRING PHYSICIAN:  Dr. Daleen Bo, Emergency Room  CONSULTING PHYSICIAN:  Shaune Pascal. Elasha Tess, MD  PRIMARY CARE PHYSICIAN: [facility]  REASON FOR CONSULTATION: Atrial fibrillation with rapid ventricular response.   HISTORY OF PRESENT ILLNESS: Ms. Capurro is a 59 year old woman who denies any known cardiac history. She apparently was treated for right breast cancer a couple of years ago at [facility]. She underwent lumpectomy and chemotherapy. Unfortunately, she appears to have developed severe peripheral neuropathy and orthostatic hypotension with it. She says she was subsequently admitted to a rehab facility in [place] for approximately two years due to this and was quite weak. However, she progressed nicely and was just discharged five days ago and moved back to a house in [place]. She says she was doing fairly well until this morning when she became very weak and presyncopal. She denies recent fevers or chills. No palpitations. No chest pain. No lower extremity edema.   She was brought to the Emergency Room. She was found to be in atrial fibrillation with rapid ventricular response with a heart rate of 120. Systolic blood pressures were in the 80s. She was given a 500 mL normal bolus with improvement in her blood pressure to about 110. She was then started on diltiazem drip. Her heart rate now is about 105. However, blood pressure continues to be low in the 70 and 80 range.   Initial lab work-up has been normal. First set of cardiac markers are normal. EKG shows atrial fibrillation with RVR. There is lateral T wave inversion, although I do not have anything to compare it to.   REVIEW OF SYSTEMS: She denies any fevers or chills. No nausea or vomiting. She does have a history of depression. No diarrhea. No abdominal pain. She did have presyncope but no syncope. Denies any  history of hypertension, stroke or diabetes. Remainder of review of systems all systems are negative except for history of present illness and problem list.   PROBLEM LIST:  1. History of right breast cancer. a. Status post lumpectomy and chemotherapy at [facility], full details unclear.  2. Peripheral neuropathy.  3. Orthostatic hypotension thought secondary to neuropathy secondary to her chemotherapy.  4. Depression.   CURRENT MEDICATIONS:  1. Tylenol 650 mg q.8 hours p.r.n.  2. Anastrozole 1 mg daily. 3. Baclofen 10 mg every 6 to 8 hours as needed.  4. Citalopram 20 mg daily. 5. Iron 325, 1 tablet t.i.d. 6. Gabapentin 600 mg t.i.d.  7. Loperamide 1 tablet every four hours p.r.n.  8. Loratadine 10 mg daily. 9. Lorazepam 0.5 mg at bedtime. 10. Omeprazole 20 mg a day. 11. Promethazine 12.5 mg rectally as needed. 12. Simethicone 80 mg q.i.d. at meals and bedtime.  13. Vitamin C 250 mg a day.   ALLERGIES: No known drug allergies.   SOCIAL HISTORY: She is not married. She does not had any kids. She lives alone in [place]. She was just discharged from a rehab facility. She denies tobacco or alcohol use.   FAMILY HISTORY: Her mother is alive and well. Father died from a massive heart attack.   PHYSICAL EXAMINATION:  GENERAL: She is lying flat in bed in no acute distress, respiratory effort is unlabored.   VITAL SIGNS: Blood pressure currently 80/50, heart rate 105.   HEENT: Normal.   NECK: Supple. No JVD. Carotids are 2+ bilaterally without any bruits. There is  no lymphadenopathy or thyromegaly.   CARDIAC: PMI is nondisplaced. She is irregularly irregular and mildly tachycardic. No murmurs, rubs, or gallops appreciated.   LUNGS: Clear.   CHEST: There is a tattoo on her chest.   ABDOMEN: Soft, nontender, nondistended. No hepatosplenomegaly. No bruits. No masses.   EXTREMITIES: Warm with no cyanosis, clubbing, or edema. No rash.   NEUROLOGIC: Alert and oriented x3. Cranial nerves  II through XII are grossly intact. Moves all four extremities without difficulty.   LABORATORY, DIAGNOSTIC AND RADIOLOGICAL DATA: Sodium 143, potassium 3.5 BUN 12, creatinine 0.92, albumin 3.6, CK 149, MB 1.8, troponin less than 0.02, white count 6.1, hemoglobin 12.6, platelet count 272. EKG: As stated above. Chest x-ray is pending.   ASSESSMENT:  1. Atrial fibrillation with rapid ventricular response, presumably new onset. a. CHADS2 score equals zero.  2. History of breast cancer status post right lumpectomy and status post chemotherapy.  3. History of orthostatic hypotension secondary to her chemotherapy.  4. Depression.   PLAN/DISCUSSION: It appears that Ms. Askin has new onset atrial fibrillation, however, the timing of this is a bit uncertain. She got out of a rehab facility five days ago at which time it appears she was in sinus rhythm, she had been feeling well since that time and just became symptomatic, so I suspect this is new onset atrial fibrillation. This has been complicated by hypotension. I agree with IV fluid support. Given the fact that she is not tolerating it well I think it is very reasonable to try one dose of oral flecainide. I will give her 200 mg to see if we can convert her. She will be admitted for observation. I would continue the diltiazem drip as tolerated for now. If she does not convert quickly with flecainide I would consider placing her on digoxin to help with rate control without significant adverse effects on her blood pressure. Would also check a TSH as well as an echocardiogram. Will continue to follow with you. She will be admitted to the hospitalist service and further decision making based on response to flecainide and the results of her echocardiogram. I have discussed this with Dr. Eulis Foster in the ER in detail.  ____________________________ Shaune Pascal. Prisilla Kocsis, MD drb:cms D: 10/22/2011 16:29:58 ET T: 10/23/2011 07:27:50 ET  JOB#: [id number] Shaune Pascal Ninfa Giannelli  MD ELECTRONICALLY SIGNED 12/06/2011 8:48

## 2015-03-01 NOTE — Consult Note (Signed)
PATIENT NAME:  Lindsay [name], Lindsay [name] MR#:  [id number] DATE OF BIRTH:  Jan 07, 1956  DATE OF CONSULTATION:  12/05/2011  REFERRING PHYSICIAN:  Deanne Coffer, MD  CONSULTING PHYSICIAN:  Scott C. Stoioff, MD  REASON FOR CONSULTATION: Right ureteral calculus.   HISTORY OF PRESENT ILLNESS: The patient is a 59 year old African American female admitted 12/02/2011 with orthostatic hypotension and Clostridium difficile colitis. She has had some intermittent right lower quadrant abdominal pain and a CT scan of the abdomen and pelvis was performed this afternoon which showed a 6 mm right proximal ureteral calculus with hydronephrosis. She denies any prior history of urologic problems or previous history of stone disease. She denies prior urological evaluation. She states she has had previous abdominal CTs and has not been told she has had a stone. There were no prior scans from this hospital. She states she has had a number of x-rays at [facility]. For the past two weeks she has had some intermittent right lower quadrant abdominal pain. She has also had chronic diarrhea. She denies any voiding symptoms or gross hematuria.   PAST MEDICAL HISTORY:  1. Breast cancer.  2. Paroxysmal atrial fibrillation. 3. Cardiomyopathy. 4. Neuropathy.  PAST SURGICAL HISTORY: Right breast lumpectomy.   ALLERGIES: No known drug allergies.   SOCIAL HISTORY: No tobacco or alcohol use.   MEDICATIONS ON ADMISSION:  1. Percocet 650/10 q.6 hours. 2. Anastrozole 1 mg daily.  3. ASA 81 mg daily.  4. Astelin 137 mcg nasal spray 2 twice daily.  5. Baclofen 5 mg q.8 hours muscle spasms.  6. Coreg 3.125 mg b.i.d.  7. Citalopram 20 mg daily.  8. Enalapril 2.5 mg daily.  9. Gabapentin 600 mg b.i.d.  10. Guaifenesin 100 mg q.6 hours p.r.n.  11. Loperamide 2 mg q.4 hours.  12. Loratadine 10 mg daily.  13. Lorazepam 0.5 mg daily.  14. Omeprazole 20 mg daily.  15. Promethazine 12.5 mg suppository p.r.n. nausea.  REVIEW OF SYSTEMS:  Otherwise, noncontributory except as per the history of present illness.   PHYSICAL EXAMINATION:   VITAL SIGNS: Temperature 97.6, pulse 62, blood pressure 133/91.  GENERAL: Pleasant female in no acute distress.   ABDOMEN: Soft. There was no right lower quadrant tenderness to my exam, however, this apparently has been intermittent.   BACK: No CVA tenderness.   LABORATORY, DIAGNOSTIC, AND RADIOLOGICAL DATA: CT was reviewed. There is a 6 mm right proximal ureteral calculus with significant hydronephrosis. Creatinine 0.86. Urinalysis on admission showed pyuria. I do not see where a urine culture was ordered. She is on ceftriaxone, ertapenem.   IMPRESSION: Right proximal ureteral calculus. The degree of her hydronephrosis may represent chronic obstruction. She is currently febrile and at this time not having significant pain. There is no indication for urgent intervention   RECOMMENDATION:  1. Will obtain a six hour postcontrast KUB.  2. Will request any abdominal CT reports from [facility] for comparison.   3. She will eventually need treatment for this stone. With the degree of her hydronephrosis, ureteroscopy may be a better choice.     4. Will follow.  ____________________________ Ronda Fairly Bernardo Heater, MD scs:drc D: 12/05/2011 18:59:15 ET T: 12/06/2011 09:09:25 ET JOB#: [id number]  cc: Nicki Reaper C. Bernardo Heater, MD, <Dictator> Abbie Sons MD ELECTRONICALLY SIGNED 12/21/2011 8:43

## 2015-03-01 NOTE — Consult Note (Signed)
Full consult to follow. Pt with cardiac hx who was treated for c.diff infection in 12/12 with flagyl. According to patient, diarrhea never completely cleared and repeat stool for c.diff not done after hosp discharge. Now with recurrent c.diff. Does c/o abd pain, which worsens with eating. Some diarrhea. Pt wants solid food. Pt aware that diarrhea and abd pain may worsen with advancing diet. Agree with vanco for min 10-14 days. Make sure repeat c.diff ordered after completion of treatment. Agree with probiotics. Please avoid antidiarrheal agents until infection clears. If pain does not improve, consider CT of abdomen and even repeating colonoscopy. Last colon 3-4 yrs ago was normal, according to patient. Discussed role of fecal transplant if c.diff is refractory or recurrent. I will be out tomorrow but will check back on Wed. THanks   Electronic Signatures: Verdie Shire (MD) (Signed on 28-Jan-13 08:09)  Authored   Last Updated: 28-Jan-13 16:45 by Verdie Shire (MD)

## 2015-03-01 NOTE — Consult Note (Signed)
Chief Complaint:   Subjective/Chief Complaint c/o intermittent rt back/ abd pain   VITAL SIGNS/ANCILLARY NOTES: **Vital Signs.:   30-Jan-13 04:33   Vital Signs Type Routine   Temperature Temperature (F) 98.3   Celsius 36.8   Temperature Source oral   Pulse Pulse 63   Pulse source per Dinamap   Respirations Respirations 18   Systolic BP Systolic BP 789   Diastolic BP (mmHg) Diastolic BP (mmHg) 98   Mean BP 117   BP Source Dinamap   Pulse Ox % Pulse Ox % 99   Pulse Ox Activity Level  At rest   Oxygen Delivery Room Air/ 21 %   Radiology Results: XRay:    28-Jan-13 22:22, KUB - Kidney Ureter Bladder   KUB - Kidney Ureter Bladder    REASON FOR EXAM:    post contrast kub- rt proximal ureteral stone  COMMENTS:       PROCEDURE: DXR - DXR KIDNEY URETER BLADDER  - Dec 05 2011 10:22PM     RESULT: Comparison: CT of the abdomen and pelvis performed same day.    Findings:  There is a 6 mm calcific density overlying the expected location of the   right midureter, which correlates with the calculus seen on the recent   prior CT. Residual contrast material is seen within the bladder. Air seen   within nondilated small and large bowel. Opal bowel suture lines in the   abdomen. An IVC filter is present.    IMPRESSION:   Findings consistent with a 6 mm calculus in the right midureter, as seen   on the recent prior CT.          Verified By: Gregor Hams, M.D., MD   Assessment/Plan:  Assessment/Plan:   Assessment right proximal ureteral calculus with high grade obstruction    Plan If ok for anesthesia will plan right ureteroscopy/ laser lithotripsy 1/31.  The procedure, aternatives, risks and need for stent post op were discussed in detail.  She indicated all questions were answered to her satisfaction and desires to proceed   Electronic Signatures: Abbie Sons (MD)  (Signed 30-Jan-13 08:58)  Authored: Chief Complaint, VITAL SIGNS/ANCILLARY NOTES, Radiology Results,  Assessment/Plan   Last Updated: 30-Jan-13 08:58 by Abbie Sons (MD)

## 2015-03-01 NOTE — Discharge Summary (Signed)
PATIENT NAME:  Lindsay [name], Lindsay [name] MR#:  [id number] DATE OF BIRTH:  [birth year]  DATE OF ADMISSION:  12/02/2011 DATE OF DISCHARGE:  12/09/2011  ADMITTING PHYSICIAN: Demetrios Loll, MD  DISCHARGING PHYSICIAN: Deanne Coffer, MD  REFERRING PHYSICIAN: Ferman Hamming, MD  PRIMARY CARE PHYSICIAN: Dr. Sherilyn Cooter ([facility])  REASON FOR ADMISSION: Near syncope.   DISCHARGE DIAGNOSES:  1. Near syncope secondary to nephrolithiasis and Clostridium difficile colitis.  2. Diarrhea.  3. Acute right lower quadrant pain from nephrolithiasis causing hydronephrosis. 4. Urinary tract infection. 5. Cardiomyopathy with ejection fraction 45%. 6. History of breast cancer. 7. Gastroesophageal reflux disease. 8. Neuropathy. 9. Anxiety.  10. Hypernatremia.  11. Hypokalemia.  12. Anemia of chronic disease.  13. Debilitation.   CONSULTANTS:  1. John Giovanni, MD. 2. Verdie Shire, MD. 3. Case Management. 4. Physical Therapy. 5. Occupational Therapy.  TESTS DONE DURING HOSPITALIZATION: KUB on 12/05/2011 showed findings consistent with a 6 mm calculus in right ureter.  CT scan of the abdomen and pelvis, on 12/05/2011, showed a 6 to 7 mm stone in the proximal right ureter with moderate to severe right hydronephrosis, degree of hydronephrosis suggests chronic obstruction. Stone fragments appear to be in the bladder. Inferior vena cava filter. Post surgical changes noted about the stomach.   KUB on 12/09/2011 showed interval placement of double J right ureteral stent and no definite ureteral calculi or abnormality. Distended loops of small bowel in the right upper quadrant where what appears to be possible area of bowel anastomosis with staple line in the right upper quadrant.   PROCEDURES: Dr. Bernardo Heater, on 12/08/2011, performed right ureteropyeloscopy with Holmium laser lithotripsy with placement of right ureteral stent with no complications.  HOSPITAL COURSE: Initial history and physical were done by Dr. Bridgett Larsson. Please refer  to his note dated 12/02/2011 for complete details. In brief, this is a 59 year old African American female with history of breast cancer status post lumpectomy and chemotherapy some years ago, neuropathy, cardiomyopathy, and paroxysmal atrial fibrillation who presented to the ED with the chief complaint of near syncope. The patient's blood pressure was 110/70 lying but decreased to the 70s when in the sitting position. The patient had similar symptoms before and was diagnosed with orthostatic hypotension. The patient also complained of diarrhea whenever she eats. She also has chronic diarrhea since chemotherapy. She was found to have Clostridium difficile several weeks ago and was on Flagyl. She was admitted to the hospitalist service.  1. Her near syncope and orthostasis was most likely due to C. difficile colitis coupled by pain from nephrolithiasis. She was asymptomatic with orthostasis. With IV fluid hydration, this resolved. We held her antihypertensive medications while she was in the hospital until we were able to hydrate her.  2. Diarrhea: This was mostly likely secondary to C. difficile colitis. She was switched to vancomycin and continued on lactobacillus. There may be a component of noncompliance as I spoke to the pharmacy when discharging the patient and I am not sure if she filled her previous prescription of Flagyl.  3. Acute right lower quadrant abdominal pain: The patient was received morphine and Toradol and was found to have nephrolithiasis, 7 mm, with moderate hydronephrosis, status post ureteroscopy with laser lithotripsy with stenting, done by Dr. Bernardo Heater. He is to see the patient as an outpatient. The patient's pain resolved and did not require any pain medications. She was on anti-ureter spasmodic.  4. Urinary tract infection: The patient received Invanz for five days.  5. Cardiomyopathy with ejection fraction of 45%:  Clinically not in congestive heart failure. The patient's ACE inhibitor  and beta blocker were held due to orthostasis. This was resumed.  6. History of breast cancer: The patient is on Anastrazole.  7. Gastroesophageal reflux disease: We continued her on Protonix. 8. Neuropathy: The patient was continued on Neurontin.  9. Anxiety: The patient was continued on p.r.n. Ativan.  10. Hypernatremia: This resolved with D5 water.  11. Hypokalemia: We repleted.  12. Anemia: This was stable and was most likely anemia of chronic disease.  13. Debilitation: The patient was recommended a 24-hour caregiver at home. She was discharged home with home health.  14. Deep vein prophylaxis: She was maintained throughout the hospital stay with TEDs and SCDs.  DISCHARGE PHYSICAL EXAMINATION:  VITALS: She was discharged on 12/09/2011 with a temperature of 98.3, heart rate 73, respiratory rate 18, blood pressure 125/86, and saturating 99% on room air.   LUNGS: Clear to auscultation.   CARDIOVASCULAR: Regular rate and rhythm.   ABDOMEN: Benign.   DISCHARGE MEDICATIONS: 1. Omeprazole 20 mg daily.  2. Vitamin C 250 mg daily.  3. Loratadine 10 mg daily.  4. FeroSul 325 mg 1 tablet three times daily. 5. Acetaminophen 325 mg 2 tablets every eight hours p.r.n.  6. Gabapentin 600 mg three times daily. 7. Lorazepam 0.5 mg once a day at bedtime.  8. Anastrazole 1 mg tablet daily.  9. Citalopram 20 mg daily.  10. Simethicone 80 mg chewable three times daily p.r.n. indigestion and heartburn. 11. Baclofen 5 mg every eight hours muscle spasm. 12. Carvedilol 3.125 mg twice a day. 13. Aspirin 81 mg daily. 14. Enalapril 2.5 mg daily.  15. Astelin 137 mcg nasal spray twice a day.  16. Acetaminophen/oxycodone 650/10 mg one every six hours.  17. Vitamin D3 1000 international units 1 tablet once a day. 18. Vitamin B12 1000 mcg once a day.  19. Flora-Q one capsule twice a day, #30 tablets. 20. Uribel oral capsule 1 capsule four times daily p.r.n. frequency and urinary burning. 21. Vancomycin  125 mg p.o. every six hours for 14 days.  OXYGEN: None.   DIET: Regular.   DISCHARGE INSTRUCTIONS/FOLLOWUP: The patient is to resume home health and see Dr. Bernardo Heater next week, Dr. Candace Cruise in one week, and Dr. Sherilyn Cooter of [place] next week.   Thank you for allowing me to participate in the care of this patient.   Addendeum:  After discharging patient and doing this discharge  summary  dictation that patient still had orthstasis after discharge orders were given,.  Patient was asymptomatic.  I called Pamala Hurry of [place]. She will advise home health nurse to check orthostatics and report to PCP.  I offered  to write a prescription for TED stockings or midodrine if needed.  Patient has follow up with PCP, Dr. Sherilyn Cooter this week  CODE STATUS: FULL CODE.   TOTAL TIME SPENT ON DISCHARGE: 45 minutes.  ____________________________ Judeth Horn Royden Purl, MD aaf:slb D: 12/10/2011 11:03:00 ET T: 12/11/2011 15:09:19 ET JOB#: [id number]  cc: Mike Craze A. Royden Purl, MD, <Dictator> Dr. Sherilyn Cooter - [facility] Scott C. Bernardo Heater, MD Lupita Dawn. Candace Cruise, MD Joaquin Bend MD ELECTRONICALLY SIGNED 12/12/2011 16:47

## 2015-03-01 NOTE — Consult Note (Signed)
For rt ureteroscopy this am. All questions were answered and she desires to procced.  Site marked  Electronic Signatures: Abbie Sons (MD)  (Signed on 31-Jan-13 10:17)  Authored  Last Updated: 31-Jan-13 10:17 by Abbie Sons (MD)

## 2015-03-01 NOTE — H&P (Signed)
PATIENT NAME:  Lindsay [name], Lindsay [name] MR#:  [id number] DATE OF BIRTH:  1956/07/17  DATE OF ADMISSION:  10/22/2011  PRIMARY CARE PHYSICIAN: Out of town ER PHYSICIAN: Dr. Renard Hamper  CHIEF COMPLAINT: Near syncope this morning.   HISTORY OF PRESENT ILLNESS: Patient is a 59 year old female with history of breast cancer status post chemotherapy and surgery having severe peripheral neuropathy came in because she felt very lightheaded this morning and was about to pass out and she called ambulance. Patient found to have atrial fibrillation with rapid ventricular response in the Emergency Room with rate of about 147 beats by EMS and then patient did not have any chest pain or trouble breathing. In the ER found to have atrial fibrillation with RVR 120 beats per minute. Initially she was on Cardizem drip 5 mg/h. Later on patient's blood pressure dropped and then Cardizem drip was stopped and Dr. Haroldine Laws saw the patient and gave a dose of flecainide along with digoxin. Patient right now denies any chest pain or trouble breathing and we are going to admit her for new onset atrial fibrillation.   PAST MEDICAL HISTORY:  1. History of breast cancer on the right side. 2. History of peripheral neuropathy. 3. Anxiety.   ALLERGIES: Patient has no known allergies.   SOCIAL HISTORY: No smoking. No drinking. Patient was at a rehab facility in [place] for almost 1.5 years and recently moved to [facility] and doesn't have any primary doctor in the local area. Patient says that she has an Transport planner and also rolling walker at home. She has just started to walk. Lives alone and has two nurses come and help her with walking and also cooking.   MEDICATIONS:  1. Vitamin C 250 mg p.o. daily.  2. Simethicone 80 mg p.o. 4 times daily.  3. Promethazine 12.5 mg 4 times daily.  4. Omeprazole 20 mg p.o. daily.  5. Lorazepam 0.5 mg p.o. daily. 6. Loratadine 10 mg p.o. daily.  7. Loperamide 2 mg every four hours as needed.  8. Gabapentin  600 mg p.o. t.i.d.  9. Ferrous sulfate 325 mg p.o. t.i.d.  10. Baclofen 10 mg p.o. every 6 to 8 hours. 11. Anastrazole 1 mg p.o. daily.  12. Tylenol 350 mg q.8 hours as needed.  13. Celexa 20 mg p.o. daily.   PAST SURGICAL HISTORY: Right breast removal.    FAMILY HISTORY: No family history of hypertension or diabetes.    REVIEW OF SYSTEMS: CONSTITUTIONAL: No fever. No fatigue but generalized weakness present. EYES: No blurred vision. ENT: No tinnitus. No epistaxis. RESPIRATORY: No cough. No wheezing. CARDIOVASCULAR: No chest pain, orthopnea. No PND. GASTROINTESTINAL: Occasional nausea with vomiting. GENITOURINARY: No dysuria, hematuria. ENDOCRINE: No polyuria, nocturia. INTEGUMENT: No skin rashes. MUSCULOSKELETAL: Has peripheral neuropathy and in rehab for 1.5 years with limited activity. NEUROLOGIC: Peripheral neuropathy present. Patient says that peripheral neuropathy is because of chemotherapy that she received for breast cancer.   PHYSICAL EXAMINATION:  GENERAL: Patient is a 59 year old female who is asking for pain medications because of neuropathy, not in distress at this time. Patient is alert, awake, oriented.   VITAL SIGNS: Temperature 95.9, pulse 96 at this time, respirations 18, blood pressure 95/70 and initial blood pressure 103/50.   HEENT: Head atraumatic, normocephalic. Pupils are equally reacting to light. Extraocular movements are intact. ENT: No tympanic membrane congestion. No turbinate hypertrophy. No oropharyngeal erythema.   NECK: Normal range of motion. No JVD. No carotid bruits.   CARDIOVASCULAR: S1, S2 irregularly irregular. PMI is not displaced.  RESPIRATORY: Bilaterally clear to auscultation. No wheeze. No rales.   ABDOMEN: Soft, nontender, nondistended. Bowel sounds present.   MUSCULOSKELETAL: Power 5/5 upper and lower extremities.   SKIN: No skin rashes.    LYMPHATIC: Patient has no lymph nodes in axilla or cervical region.    NEUROLOGIC: Cranial nerves  intact. Sensation decreased bilaterally in upper and lower extremities. No dysarthria or aphasia.   PSYCH: Oriented to time, place, person.   LABORATORY, DIAGNOSTIC, AND RADIOLOGICAL DATA: Chest x-ray showed no acute cardiopulmonary event. WBC 6.1, hemoglobin 12.6, hematocrit 38.7, platelets 272. Electrolytes: Sodium 143, potassium 3.5, chloride 110, bicarbonate 26, BUN 12, creatinine 0.92, glucose 90, CPK-MB 1.8, CK total 149, troponin less than 0.02. EKG showed atrial fibrillation with rapid ventricular response, 120 beats per minute in the ER with no ST-T changes. She does have some T wave inversions in leads V4 and V5.   ASSESSMENT AND PLAN: 59 year old female patient with history of breast cancer status post chemotherapy and also neuropathy came in with:  1. Near syncope. Patient found to have new onset atrial fibrillation with RVR. Patient has no history of heart disease. Has history of orthostatic hypotension so we are going to hydrate her with normal saline and hold the beta blocker. Flecainide 200 mg was given. She is going to get digoxin 0.5 mg one dose and hopefully IV fluids will help her to maintain the rhythm along with the flecainide. Patient will get echocardiogram tomorrow. Can get p.r.n. metoprolol for heart rate more than 100. No anticoagulation at this time as patient is low risk for heart disease.  2. Patient has history of neuropathy. Continue Neurontin and baclofen.  3. History of breast cancer on anastrozole 1 mg daily, continue.  4. Patient's condition is stable. We are going to admit her to telemetry.       TIME SPENT ON HISTORY AND PHYSICAL: More than 60 minutes.   ____________________________ Epifanio Lesches, MD sk:cms D: 10/22/2011 18:22:13 ET T: 10/23/2011 09:08:07 ET JOB#: [id number]  cc: Epifanio Lesches, MD, <Dictator> Epifanio Lesches MD ELECTRONICALLY SIGNED 11/10/2011 19:28

## 2015-03-01 NOTE — Consult Note (Signed)
Chief Complaint:   Subjective/Chief Complaint Diarrhea improving. 2 BM's yest. 1 so far today. May have kidney stone removal tomorrow.   VITAL SIGNS/ANCILLARY NOTES: **Vital Signs.:   30-Jan-13 07:14   Vital Signs Type Recheck   Systolic BP Systolic BP 180   Diastolic BP (mmHg) Diastolic BP (mmHg) 76   Mean BP 87   BP Source Dinamap   Brief Assessment:   Cardiac Regular    Respiratory clear BS    Gastrointestinal minimal tenderness   Routine Chem:  30-Jan-13 05:26    Glucose, Serum 94   BUN 5   Creatinine (comp) 0.96   Sodium, Serum 141   Potassium, Serum 4.2   Chloride, Serum 105   CO2, Serum 26   Calcium (Total), Serum 8.7   Osmolality (calc) 278   eGFR (African American) >60   eGFR (Non-African American) >60   Anion Gap 10   Assessment/Plan:  Assessment/Plan:   Assessment C.diff. Clinically improving.    Plan Continue vanco for min 10, preferably 14 days. Probiotics. Recheck stool post treatment. WIll sign off. Call back if condition changes. Pt can f/u with [place] after discharge. Thanks   Electronic Signatures: Verdie Shire (MD)  (Signed 30-Jan-13 13:43)  Authored: Chief Complaint, VITAL SIGNS/ANCILLARY NOTES, Brief Assessment, Lab Results, Assessment/Plan   Last Updated: 30-Jan-13 13:43 by Verdie Shire (MD)

## 2015-03-01 NOTE — H&P (Signed)
PATIENT NAME:  Lindsay [name], Lindsay [name] MR#:  [id number] DATE OF BIRTH:  Dec 16, 1955  DATE OF ADMISSION:  12/02/2011  PRIMARY CARE PHYSICIAN:  None local.   REFERRING PHYSICIAN:  Dr Benjaman Lobe  CHIEF COMPLAINT:  Near syncope today.   HISTORY OF PRESENT ILLNESS: A 59 year old African American female with a history of breast cancer status post lumpectomy, chemotherapy some years ago, neuropathy, cardiomyopathy, paroxysmal atrial fibrillation, presented to the ED with above chief complaint. The patient now is alert, awake, oriented. She reported that she almost passed out last night and had two similar episodes today this morning. She feels dizzy, headache, weakness. Her blood pressure in the ED was 110/70 while lying on the bed, but decreased to 70's in sitting position.  Patient had similar symptoms before and was diagnosed with orthostatic hypotension. The patient also complains to have diarrhea whenever she eats.  She also has chronic diarrhea for a long time since chemotherapy. She was found to have a Clostridium difficile last year and was treated with Flagyl. She also has a history of paroxysmal atrial fibrillation last year.   PAST MEDICAL HISTORY: Right-sided breast cancer, neuropathy, cardiomyopathy, paroxysmal atrial fibrillation, orthostatic hypotension, C. difficile, and chronic diarrhea.   PAST SURGICAL HISTORY: Right breast lumpectomy.   FAMILY HISTORY: Hypertension.   SOCIAL HISTORY: Denies any smoking, alcohol drinking, or illicit drugs.   ALLERGIES: None.   MEDICATIONS:  1. Acetaminophen 325 mg p.o. 2 tablets q.8 hours p.r.n.  2. Percocet 650/10 mg p.o. every 6 hours.   3. Anastrozole 1 mg p.o. daily.  4. Aspirin 81 mg p.o. daily.  5. Astelin 137 mcg inhalation nasal spray 2 sprays twice daily.  6. Baclofen 5 mg p.o. every 8 hours for muscle spasm.  7. Coreg 3.125 mg p.o. b.i.d.  8. Citalopram 20 mg p.o. daily.  9. Enalapril 2.5 mg p.o. daily.   10. FeroSul 325 mg p.o. t.i.d.   11. Gabapentin 600 mg p.o. t.i.d.  12. Guaifenesin 100 mg per 5 mL p.o. 5 mL p.o. every 6 hours.   13. Loperamide 2 mg p.o. every 4 hours.   14. Loratadine 10 mg p.o. once daily.  15. Lorazepam 0.5 mg p.o. once daily at bedtime.  16. Omeprazole 20 mg p.o. once daily.   17. Promethazine 12.5 mg rectal p.r.n. for nausea, vomiting.   18. Simethicone 80 mg p.o. t.i.d. p.r.n.  19. Vitamin B12, 1000 mcg p.o. daily.  20. Vitamin C 250 mg p.o. once daily.   21. Vitamin D3, 1000 units p.o. once daily.   REVIEW OF SYSTEMS: CONSTITUTIONAL: The patient denies any fever, chills, but has a headache, dizziness, fatigue, generalized weakness. No weight loss. ENT: No double vision, blurred vision , or glaucoma. ENT: No discharge from ear or nose. No epistaxis. No postnasal drip. RESPIRATORY: Positive for cough but no sputum, wheezing, or hematemesis. No shortness of breath. CARDIOVASCULAR: No chest pain, palpitation, or orthopnea. No nocturnal dyspnea. No edema. GASTROINTESTINAL: Has nausea but no vomiting or abdominal pain.  Has chronic diarrhea. No melena. GU: No dysuria or hematuria. No urinary frequency or incontinence. HEMATOLOGY: No easy bruising, bleeding. NEUROLOGY: Positive for near syncope. No loss of consciousness or seizure but has a headache and dizziness.  PSYCHIATRY: No depression or anxiety. SKIN: No rash or jaundice.   PHYSICAL EXAMINATION:  VITALS: Temperature 98.5, pulse 81, respirations 16, blood pressure 137/95, oxygen saturation 100%.   GENERAL: The patient is alert, awake, oriented, in no acute distress.   HEENT: Pupils round, equal, react to  light and accommodation.   NECK: Supple. No JVD or carotid bruit. No lymphadenopathy. Moist oral mucosa. Clear oropharynx.   CARDIOVASCULAR: S1, S2, regular rate and rhythm. No murmurs or gallops.   LUNGS: Bilateral air entry. No wheezing or rales.   ABDOMEN: Soft. No distention or tenderness. No organomegaly. Bowel sounds present.    EXTREMITIES: No edema, clubbing, or cyanosis. No calf tenderness. Strong pedal pulses.   NEUROLOGY: Alert and oriented x3. No focal deficits. Deep tendon reflexes 2+.   SKIN: No rash or jaundice.   LABORATORY DATA:  Glucose 88, BUN 10, creatinine 0.82, sodium 146, potassium 3.2, chloride 111, bicarbonate 27. WBC 7.6, hemoglobin 10.5, platelets 203,000. Troponin 0.02. EKG showed normal sinus rhythm at 68 beats per minute.   IMPRESSION:  1. Near syncope possibly due to orthostatic hypotension.  2. Orthostatic hypotension.  3. Hypokalemia.  4. Hypernatremia.  5. Anemia.  6. Neuropathy.  7. Cardiomyopathy.  8. History of right breast cancer.   PLAN OF TREATMENT:  1. The patient will be placed for observation. We will continue telemetry monitor, IV normal saline, hold Coreg, lisinopril.  2. Patient was given potassium in the ED. We will follow up BMP.  3. Zofran for nausea, vomiting.  4. Continue aspirin and other home medications.  5. GI and deep vein thrombosis prophylaxis.   TIME SPENT: About 65 minutes.   ____________________________ Demetrios Loll, MD/qc:vtd D: 12/02/2011 20:33:54 ET   T: 12/03/2011 05:53:18 ET   JOB#: [id number] cc: Demetrios Loll, MD, <Dictator> Demetrios Loll MD ELECTRONICALLY SIGNED 12/03/2011 17:54

## 2015-03-01 NOTE — Consult Note (Signed)
Brief Consult Note: Diagnosis: right proximal ureteral calculus.   Patient was seen by consultant.   Consult note dictated.   Orders entered.   Comments: may be chronic- no indication for urgent intervention.  Electronic Signatures: Abbie Sons (MD)  (Signed 28-Jan-13 19:02)  Authored: Brief Consult Note   Last Updated: 28-Jan-13 19:02 by Abbie Sons (MD)

## 2015-03-01 NOTE — Consult Note (Signed)
Chief Complaint:   Subjective/Chief Complaint back/abd pain better. mild to mod stent sxs.   VITAL SIGNS/ANCILLARY NOTES: **Vital Signs.:   01-Feb-13 05:36   Vital Signs Type Routine   Temperature Temperature (F) 98.3   Celsius 36.8   Temperature Source oral   Pulse Pulse 73   Pulse source per Dinamap   Respirations Respirations 18   Systolic BP Systolic BP 309   Diastolic BP (mmHg) Diastolic BP (mmHg) 86   Mean BP 99   BP Source Dinamap   Pulse Ox % Pulse Ox % 99   Pulse Ox Activity Level  At rest   Oxygen Delivery Room Air/ 21 %   Radiology Results: XRay:    01-Feb-13 08:28, KUB - Kidney Ureter Bladder   KUB - Kidney Ureter Bladder    REASON FOR EXAM:    s/p ureteroscopy/laser lithotripsy  COMMENTS:       PROCEDURE: DXR - DXR KIDNEY URETER BLADDER  - Dec 09 2011  8:28AM     RESULT: A double-J right ureteral stent has been placed since the   previous exam of 12-05-2011. An inferior vena cava filter is present.   There appear to be slightly distended loops of small bowel in the right   mid abdomen. Air and fecal material is scattered through the colon to the   rectum. Surgical clips are present.    IMPRESSION:  Interval placementof a double-J right ureteral stent. No   definite ureteral calculi are evident. Mildly distended loops of small   bowel in the right upper quadrant near what appears to be a possible area   of bowel anastomosis and staple line in the right upper quadrant.  Thank you for the opportunity to contribute to the care of your patient.           Verified By: Sundra Aland, M.D., MD   Assessment/Plan:  Assessment/Plan:   Assessment 1.  Right proximal ureteral calculus status post ureteroscopy/laser lithotripsy.  KUB today shows no identifiable fragments.  Would leave her stent indwelling for approximately one week.  If she is still hospitalized next week can remove while an inpatient.  She she may otherwise followup for outpatient removal.    Electronic Signatures: Abbie Sons (MD)  (Signed 01-Feb-13 13:25)  Authored: Chief Complaint, VITAL SIGNS/ANCILLARY NOTES, Radiology Results, Assessment/Plan   Last Updated: 01-Feb-13 13:25 by Abbie Sons (MD)

## 2015-03-01 NOTE — Op Note (Signed)
PATIENT NAME:  Lindsay [name], Lindsay [name] MR#:  [id number] DATE OF BIRTH:  [birth year]  DATE OF PROCEDURE:  12/08/2011  PREOPERATIVE DIAGNOSIS: Right proximal ureteral calculus.   POSTOPERATIVE DIAGNOSIS:  Right proximal ureteral calculus.  PROCEDURE:  1. Right ureteropyeloscopy with holmium laser lithotripsy.  2. Placement of right ureteral stent.   SURGEON: John Giovanni, M.D.   ASSISTANT: None.   ANESTHESIA: General.   INDICATION: This is a 59 year old female admitted last week with static hypertension and a history of diarrhea and C. difficile colitis. She has noted some right lower quadrant and right flank pain. CT scan demonstrated a 6-mm right proximal ureteral calculus with high-grade obstruction. There is no prior history of stone disease. After discussion of treatment options, she has elected an attempt at ureteroscopic removal.   DESCRIPTION OF PROCEDURE: The patient was taken to the operating room where a general anesthetic was administered. She was placed in the low lithotomy position and her external genitalia were prepped and draped sterilely. A  time-out was performed per protocol. A 21 French cystoscope sheath with obturator was lubricated and passed per urethra. Panendoscopy was performed which shows normal-appearing bladder mucosa without erythema, solid or papillary lesions. The ureteral orifices were normal in appearance. A 0.035 guidewire was placed through the cystoscope and into the right ureteral orifice. Guidewire could not be negotiated past the stone under fluoroscopic guidance. The cystoscope was removed. A 6 French semirigid ureteroscope was lubricated and passed per urethra. The guidewire was placed through the ureteroscope and into the orifice and the ureteroscope was advanced over the wire. The scope was placed up to the proximal ureter where an area of ureteral edema was noted. The guidewire was then placed under direct vision past the stone into the renal pelvis. The  ureteroscope was removed and then repassed alongside the wire. A 1.8 French escape laser basket was placed through the ureteroscope and advanced past the stone and opened. The stone was ensnared. A laser fiber would not advance through the ureteroscope.  In attempts at advancing the wire the stone became dislodged from the basket and migrated to the kidney. The ureteroscope was advanced into the kidney. The stone could be partially visualized; however, it could not be ensnared with the basket. A second guidewire was placed and the semirigid ureteroscope was removed. A ureteral access sheath was placed over the second wire without difficulty and the second guidewire was removed. A flexible digital ureteroscope was then placed through the ureteral access sheath and easily advanced up into the renal pelvis. The stone was visualized in the calyx. A 273-micron holmium laser fiber was then placed through the ureteroscope. At an energy of 0.2 joules and a rate of 50 Hz, the stone was "popcorned" into multiple minute fragments. No large fragments were seen. The ureteroscope and access sheath were then removed. A 6 French/24-cm Bard stent was placed over the original guidewire. The guidewire was removed. There was good curl seen in the renal pelvis. Prior to stent insertion no fragments could be seen under fluoroscopy. The cystoscope was replaced and the distal end of the stent was well positioned in the bladder. The bladder was emptied and the cystoscope was removed. The patient was taken to PAC-U in stable condition. There were no complications. EBL was minimal.    ____________________________ Ronda Fairly. Bernardo Heater, MD scs:bjt D: 12/08/2011 19:12:00 ET T: 12/09/2011 11:05:44 ET JOB#: [id number]  cc: Nicki Reaper C. Bernardo Heater, MD, <Dictator> Abbie Sons MD ELECTRONICALLY SIGNED 12/21/2011 8:44

## 2015-03-01 NOTE — Discharge Summary (Signed)
PATIENT NAME:  Lindsay [name], Lindsay [name] MR#:  [id number] DATE OF BIRTH:  [birth year]  DATE OF ADMISSION:  10/22/2011 DATE OF DISCHARGE:  10/24/2011  PRIMARY CARE PHYSICIAN: Nonlocal.  REASON FOR ADMISSION: Syncope.   DISCHARGE DIAGNOSES:  1. Syncope due to atrial fibrillation with rapid ventricular response plus orthostatic hypotension.  2. Atrial fibrillation with rapid ventricular response.  3. 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  4. Clostridium difficile colitis.  5. Orthostatic hypotension.  6. Anemia, new diagnosis.  7. 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 8. History of breast cancer status post chemotherapy.  9. History of anxiety.  DISCHARGE MEDICATIONS:  1. Flagyl 500 mg p.o. q.8 hours x9 days. 2. Coreg 3.125 mg p.o. b.i.d.  3. Enalapril 2.5 mg p.o. daily. 4. Aspirin 81 mg p.o. daily.  5. Omeprazole 20 mg daily.  6. Vitamin C 250 mg daily.  7. Loratadine 10 mg daily.  8. FeroSul 325 mg p.o. t.i.d.  9. Acetaminophen 325 mg 2 tabs p.o. every eight hours p.r.n.  10. Gabapentin 600 mg p.o. t.i.d.  11. Lorazepam 0.5 mg p.o. at bedtime.  12. Anastrozole 1 mg p.o. daily.  13. Citalopram 20 mg daily. 14. Promethazine 12.5 mg rectal suppository as needed for nausea and vomiting. 15. Simethicone 80 mg 1 tab p.o. t.i.d. p.r.n. indigestion.  16. Baclofen 5 mg p.o. q.8 hours p.r.n. muscle spasm.   DISCHARGE DISPOSITION: Home with home health and physical therapy.   DISCHARGE ACTIVITY: As tolerated, services arranged at home will be home health and physical therapy.   DISCHARGE DIET: Low sodium, low fat, low cholesterol.   DISCHARGE INSTRUCTIONS:  1. Follow-up to primary care physician within 1 to 2 weeks. The patient needs repeat CBC and blood pressure check within one week.  2. Follow-up with Dr. Rockey Situ within 1 to 2 weeks.   CONSULTATIONS DURING THIS  HOSPITALIZATION: Cardiology, Dr. Haroldine Laws and Dr. Rockey Situ.   PROCEDURES:  1. Portable chest x-ray 10/22/2011: No acute cardiopulmonary abnormalities are noted.  2. 2-D echocardiogram 06/20/4817: LV systolic function mild to moderately reduced, ejection fraction 40 to 45%. Transmitral spectral Doppler flow pattern is suggestive of impaired LV relaxation and diastolic dysfunction. There is mild to moderate global hypokinesis of the left ventricle. Right ventricular systolic function is normal. Left atrial size is normal. Right ventricular systolic pressure is normal. EKG on admission: Atrial fibrillation with rapid ventricular response, heart rate 120 beats per minute.   PERTINENT LABORATORY DATA: EKG with atrial fibrillation with rapid ventricular response on admission with heart rate 120 beats per minute. Stool for occult blood was negative 10/23/2011. Vitamin B12 from 10/23/2011 1,230. Stool for C. difficile toxin is positive from 10/23/2011. Retic count was 2.1 on 10/23/2011. CBC normal on admission. Hemoglobin 9.5 from 10/22/2001 and hemoglobin 10.2 on the day of discharge. TSH 2.25 on 10/23/2011. Cardiac enzymes negative x3 sets during this hospitalization. LFTs normal on admission. Lipid panel 10/22/2011: cholesterol 205, triglycerides 109, HDL 81, and LDL 102. Iron panel: Serum iron was 66 with TIBC 211. Iron saturation 31, ferritin 563, folic acid 14.9. CMP normal on admission.   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 Please see dictated admission history and physical for pertinent details surrounding the onset of this hospitalization. Please  see below for further details.  1. Syncope, multifactorial from atrial fibrillation with rapid ventricular response plus  orthostatic hypotension as noted by EKG in the ER as well as vital signs checked. In the ER, the patient received a dose of flecainide and also IV Cardizem push and, thereafter, she converted to normal sinus rhythm and has remained in normal sinus rhythm throughout the remainder of this hospitalization. She was admitted to the telemetry unit and started on IV fluids for orthostatic hypotension. With IV fluids, the patient was no longer hypotensive. She had no witnessed syncopal episodes during this hospitalization. Cardiology consultation was obtained. Echocardiogram was obtained with results as mentioned above and the patient likely has cardiomyopathy with chronic systolic and diastolic congestive heart failure with ejection fraction 40 to 45% and this is felt to be chemotherapy induced per cardiology. During this hospitalization, she was well compensated.  2. For her cardiomyopathy and chronic systolic and diastolic congestive heart failure, cardiology has added Coreg and ACE inhibitor after her hypotension had resolved. She will follow-up closely with cardiology as an outpatient.  3. For her atrial fibrillation, as above, she converted to normal sinus rhythm with flecainide and Cardizem push. Once her hypotension had resolved, cardiology had started her on beta blocker therapy (Coreg) for heart rate control. Her CHADs-2 score is 0 and cardiology recommends aspirin for cerebrovascular accident prophylaxis. She will be on Coreg and ACE inhibitor for her cardiomyopathy which appears to be chemotherapy induced. She will follow-up closely with cardiology as an outpatient. Her syncope was felt to be from orthostatic hypotension and atrial fibrillation with rapid ventricular response rather than neurologic event as there were no focal neurologic abnormalities noted on exam. Therefore, we did not perform neurologic work-up such as head CT. Also, with telemetry monitoring, the patient has remained in normal sinus rhythm  after she converted out of atrial fibrillation in the ER and echocardiogram results were as noted above. It was felt that carotid stenosis was not accounting for the patient's syncope, therefore, we did not perform carotid Doppler's as well.  4. C-diff colitis. The patient has had chronic diarrhea for quite some time prior to admission and stool studies were obtained. C. difficile toxin returned positive. She was started on Flagyl and thereafter started having soft formed bowel movements. She was advised a 10 day course of Flagyl and has completed 1 out of 10 days of inpatient antibiotics and has nine additional days of Flagyl therapy remaining at the time of discharge.  5. Orthostatic hypotension. Post hydration, she was no longer hypotensive and she will need close outpatient follow-up for blood pressure checks and it was also felt that orthostatic hypotension was contributing to her syncopal event in addition to atrial fibrillation with rapid ventricular response as above.  6. Anemia, new diagnosis. The patient does take an iron supplement at home. This is likely why her serum iron level was normal and she was not iron deficient. B12 and folate levels were also normal. Retic count was noted as above. Stool was negative for occult blood. The exact cause of her anemia is unclear. This can further be worked up as an outpatient and there were no indications for blood transfusion during this hospitalization. There was also a concern for possible chemotherapy induced anemia, but her last dose of chemotherapy was given a few years ago per the patient and her hemoglobin and hematocrit were normal on admission and 10.2 on the day of discharge, some of which could also be dilutional.  7. History of severe peripheral neuropathy, chemotherapy-induced. The patient is to continue Neurontin.  8. History of breast cancer status post chemotherapy and surgery. The patient is to continue anastrozole.   DISPOSITION: The  patient was seen by physical therapy prior to discharge and recommendation is made to discharge the patient home with home health and physical therapy, both of which have been arranged for this patient. On 10/24/2011, the patient was hemodynamically stable and without any witnessed syncopal events and her heart rate and blood pressure were well controlled and she was felt to be stable for discharge home with home health and physical therapy with close outpatient follow-up to which the patient was agreeable.  TIME SPENT ON DISCHARGE: Greater than 30 minutes.   ____________________________ Romie Jumper, MD knl:ap D: 10/28/2011 18:12:46 ET T: 10/30/2011 14:43:42 ET JOB#: [id number]  cc: Romie Jumper, MD, <Dictator> Minna Merritts, MD Romie Jumper MD ELECTRONICALLY SIGNED 11/10/2011 16:23

## 2015-03-08 NOTE — H&P (Signed)
PATIENT NAME:  Lindsay [name], Lindsay [name] MR#:  [id number] DATE OF BIRTH:  [birth year]  DATE OF ADMISSION:  01/05/2015  PRIMARY CARE PROVIDER: Thurmon Fair. Komives, MD in [place].  EMERGENCY DEPARTMENT REFERRING PHYSICIAN: Debbrah Alar, MD  CHIEF COMPLAINT: Syncope.   HISTORY OF PRESENT ILLNESS: The patient is a 59 year old African American female with history of breast cancer status post lumpectomy and gastric bypass who has chronic diarrhea since 2009, who has a history of severe orthostatic hypotension and was actually seen in the ED 2 days ago with a fall. She was discharged home; comes back. She was sitting on her commode and again passed out today. We were asked to admit the patient for further evaluation. The patient has chronic diarrhea she complains, which is unchanged. She also reports that she has chronic neuropathy and has chronic pain. She is on multiple pain medications. She denies any fevers or  chills. No chest pains or shortness of breath.   PAST MEDICAL HISTORY:  1.  History of breast cancer, status post lumpectomy.  2.  Anxiety/depression.  3.  Peripheral neuropathy.  4.  Chronic pain syndrome, on high-dose narcotics.  5.  GERD.  6.  History of atrial fibrillation.  7.  History of congestive heart failure with EF 45%.  8.  Status post hysterectomy.  9.  Gastric bypass in 2005.  10.  Chronic diarrhea.  11.  Chronic severe orthostatic hypotension.  12.  History of nephrolithiasis with hydronephrosis.  13.  Tobacco abuse.  14.  Anemia of chronic disease.   ALLERGIES: None.   MEDICATIONS: Vitamin D3 at 1000 international units daily, vitamin C 500 mg 1 tablet p.o. daily, tizanidine 4 mg every 8 hours as needed, spironolactone 25 p.o. daily, simethicone 80 mg 1 tablet p.o. q. 6 hours p.r.n., pregabalin 150 mg 1 tablet p.o. b.i.d., potassium chloride 20 mEq b.i.d., oxycodone 5 mg q. 6 hours as needed, oxycodone 15 mg q. 6 hours scheduled, omeprazole 20 mg daily, midodrine 2.5 mg  1 tablet p.o. 3 times a week, iron polysaccharide 1 tablet p.o. daily, fluticasone nasal spray 1 spray to each nostril daily, fludrocortisone 0.1 mg 2 tablets daily, duloxetine 60 mg daily, clonazepam 0.5 mg 2 times a day as needed, aspirin 81 mg 1 tablet p.o. daily, anastrozole 1 mg daily.   SOCIAL HISTORY: The patient lives alone. Ambulates with a cane. Used to smoke in the past, but does not smoke anymore. No drug use.   FAMILY HISTORY: Positive for diabetes and stroke.   REVIEW OF SYSTEMS:  CONSTITUTIONAL: Complains of chronic fatigue and excessive weight loss, chronic pain.  EYES: No blurred or double vision. No redness.  EARS, NOSE, AND THROAT: No tinnitus. No ear pain. No hearing loss.  RESPIRATORY: No cough, wheezing, or hemoptysis.  CARDIOVASCULAR: Denies any chest pain or palpitations. Has orthostatic hypotension.  GASTROINTESTINAL: Has chronic nausea, vomiting, diarrhea, abdominal pain.  ENDOCRINE: Denies any polyuria, nocturia, or thyroid problems.  HEMATOLOGIC/LYMPHATIC: Denies easy bruisability. Has history of anemia of chronic disease.  SKIN: No acne. No rash.  MUSCULOSKELETAL: Has arthritis.  NEUROLOGICAL: Has neuropathy. No CVA, TIA, or seizures.  PSYCHIATRIC: Has anxiety and depression.   PHYSICAL EXAMINATION:  VITAL SIGNS: Temperature 97.5, pulse 68, respirations 20, blood pressure 113/94, oxygen saturation 100%.  GENERAL: The patient is a thin female in no acute distress.  HEENT: Head atraumatic, normocephalic. Pupils equally round, reactive to light and accommodation. There is no conjunctival pallor. No sclerae icterus. Nasal exam shows no drainage or ulceration.  Oropharynx is dry.  NECK: Supple without any thyromegaly.  CARDIOVASCULAR: Regular rate and rhythm. No murmurs, rubs, clicks, or gallops.  LUNGS: Clear to auscultation bilaterally without any rales, rhonchi, wheezing.  ABDOMEN: Soft, nontender, nondistended. Positive bowel sounds x 4.  EXTREMITIES: No  clubbing, cyanosis, or edema.  SKIN: No rash.  LYMPH NODES: Nonpalpable.  MUSCULOSKELETAL: There is no erythema or swelling.  VASCULAR: Good DP, PT pulses.  PSYCHIATRIC: Not anxious or depressed. NEUROLOGIC: Cranial nerves II through XII are grossly intact. No focal deficits.   LABORATORY DATA: Glucose 127, BUN 13, creatinine 1.07, sodium 142, potassium 3.4, chloride 116, CO2 is 20, calcium 9. Troponin less than 0.02. WBC 5.6, hemoglobin 10.8, platelet count 150,000.   ASSESSMENT AND PLAN: The patient is 59 year old with history of severe orthostatic hypotension, chronic pain syndrome, chronic diarrhea, who comes in with syncope.  1.  Syncope, likely due to severe orthostatic hypotension. At this time, we will give her intravenous fluids, monitor on telemetry.  2.  Chronic orthostatic hypotension, likely due to multiple sedating medications, as well as chronic ongoing orthostatic autonomic dysfunction. She is already on Florinef and midodrine, which is already maximized, so no additional medication can be given.  3.  Chronic pain syndrome. Continue home medications as taking.  4.  Anxiety/depression. Continue clonazepam.  5.  Gastroesophageal reflux disease. We will continue Protonix.  6.  Neuropathy. We will continue Lyrica.  7.  Miscellaneous: She will have sequential compression devices for deep vein thrombosis prophylaxis.   TIME SPENT: 50 minutes.   ____________________________ Lafonda Mosses Posey Pronto, MD shp:ts D: 01/05/2015 17:04:29 ET T: 01/05/2015 17:29:17 ET JOB#: [id number]  cc: Basma Buchner H. Posey Pronto, MD, <Dictator> Alric Seton MD ELECTRONICALLY SIGNED 01/09/2015 15:22

## 2015-03-08 NOTE — Consult Note (Signed)
PATIENT NAME:  Lindsay [name], Lindsay [name] MR#:  [id number] DATE OF BIRTH:  1956-04-13  DATE OF CONSULTATION:  01/06/2015  REFERRING PHYSICIAN:   CONSULTING PHYSICIAN:  Gonzella Lex, MD  IDENTIFYING INFORMATION AND REASON FOR CONSULTATION: A 59 year old woman with a history of multiple medical problems who is currently in the hospital because of an episode of syncope. Consult for depression.   HISTORY OF PRESENT ILLNESS: Information obtained from the patient and the chart. The patient came into the hospital after passing out while sitting on the toilet. She tells me this was an isolated episode, but it sounds like she has had several falls in the past as well. Psychiatrically she tells me that her mood stays down most of the time. She has frequent crying spells. She says that she stays in bed most of the time and does very little. She cannot name anything in her life that she really enjoys. Says that she sleeps erratically and has not been eating much. She has been sad like this she says for about 2 months. She denies having any actual intent to harm herself, but says there have been times when she has just wished that she would not wake up. She is not reporting any psychotic symptoms. The patient is already taking Cymbalta 60 mg a day and clonazepam 0.5 mg twice a day as needed for anxiety. She has been on these medicines for an unknown period of time. The patient complains that no one visits her and she feels lonely all the time, which is her biggest stress.   PAST PSYCHIATRIC HISTORY: The patient has been treated with antidepressants as I can observe from prior visits. She denies ever having actually been to a psychiatrist or in a psychiatric hospital. Denies any history of suicide attempts.   SUBSTANCE ABUSE HISTORY: Denies any history of alcohol or drug abuse.   SOCIAL HISTORY: She lives alone in an apartment. She has stepchildren, but they do not visit her. No biological children of her own. She reports  that her father died a few years ago, which is something she still grieves very badly. Her elderly mother is still alive and she had been living with her mother, but says the mother cannot "take care of her" anymore. The patient says that after she had cancer and her own husband died she lived with her mother for a while and during that time her mother took care of her.   PAST MEDICAL HISTORY: The patient has a history of cancer which she has recovered from. She currently suffers from chronic diarrhea of unclear etiology. Has chronic pain problems. She says she hurts all over her body although she describes it as a neuropathy.   FAMILY HISTORY: Knows of no family history of mental health problems.   REVIEW OF SYSTEMS: Says she has pain all over her body. Feels tired. Feels run down and somewhat hopeless. Denies actual suicidal ideation. Denies hallucinations. Does feel sad, has frequent crying spells, especially when thinking about the death of her husband or her father.   MENTAL STATUS EXAMINATION: This is a chronically ill-appearing woman who looks older than her stated age, interviewed in a hospital bed. She was cooperative but slow. Eye contact adequate. Psychomotor activity though very sluggish. Speech very slow in rate. Affect showed several instances of breaking down into sobbing, especially when recalling the death of either her husband of her father. Mood stated as bad. Thoughts a little bit disorganized, but not bizarre. No obvious delusions. Denies auditory  or visual hallucinations. Denies suicidal ideation. Denies homicidal ideation. The patient could repeat 3 words immediately, remembered 2 out of 3 at 3 minutes. She was alert and oriented x 4. Judgment and insight seem fairly adequate.   LABORATORY RESULTS: Admission laboratories  included an elevated glucose of 127, low potassium 3.4, elevated chloride 116. Troponins negative. Low hemoglobin and hematocrit, but normal white count. Glucose  repeated today was normal. TSH normal. EKG, normal sinus rhythm.   VITAL SIGNS: Blood pressure currently only 93/63, pulse 83, respirations 20, temperature 99.5.   ASSESSMENT: A 59 year old woman who has been treated with antidepressants, although it unclear if that was for depression or for chronic pain. She is currently reporting multiple symptoms of depression. Not suicidal, not psychotic. The patient told me straight out that she wished that she could go live in a rest home. She told me that the thing she is looking forward to most in life is if she could sell the 1 acre of property she owns so that she could go live in a rest home because she is so lonely in her current situation. The patient does appear that she would benefit from more treatment of her depression as well as a consideration of a change in her psychosocial environment.   TREATMENT PLAN: I notice that she is taking the Cymbalta 60 mg a day and that she has been having chronic diarrhea. The 2 things may be unrelated, but it also is possible that the Cymbalta could be an occult cause of her diarrhea. Even though it is usually more indicated for chronic pain I am going to decrease the dose of it to 30 mg a day since it is clearly not helping with her mood. I am going to start her on 15 mg of Remeron at night with a plan to hopefully cross-titrate onto a higher dose of Remeron. I have put in a social work consult, asking that they speak with the patient about the possibility of moving into a different living situation at discharge.   DIAGNOSIS PRINCIPAL AND PRIMARY:   AXIS I: Major depression, recurrent, moderate.   SECONDARY DIAGNOSES:   AXIS I: No further diagnosis.   AXIS II: Deferred.   AXIS III:  1.  Chronic diarrhea.  2.  History of cancer.     ____________________________ Gonzella Lex, MD jtc:bu D: 01/06/2015 20:05:26 ET T: 01/06/2015 20:46:54 ET JOB#: [id number]  cc: Gonzella Lex, MD, <Dictator> Gonzella Lex  MD ELECTRONICALLY SIGNED 01/13/2015 10:38

## 2015-03-08 NOTE — Discharge Summary (Signed)
PATIENT NAME:  Lindsay [name], Lindsay [name] MR#:  [id number] DATE OF BIRTH:  05/20/1956  DATE OF ADMISSION:  01/05/2015 DATE OF DISCHARGE:  01/07/2015  ADMISSION COMPLAINT: Syncopal event.   DISCHARGE DIAGNOSES: 1.  Autonomic dysfunction with chronic orthostatic hypotension.  2.  Depression and anxiety.  3.  Chronic pain due to peripheral neuropathy. 4.  History of breast cancer.  5.  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  6.  Gastroesophageal reflux disease.  7.  History of atrial fibrillation.  8.  History of congestive heart failure, ejection fraction 45%.  9.  Status post hysterectomy. 10.  Status post gastric bypass procedure in 2005. 11.  Chronic diarrhea as a result of gastric bypass procedure.  12.  History of nephrolithiasis with hydronephrosis.  13.  Ongoing tobacco abuse.  14.  Anemia of chronic disease.   CONSULTATIONS: Dr. Alethia Berthold, psychiatry.   PROCEDURES: None.   HISTORY OF PRESENT ILLNESS: This is a 59 year old African American woman with history of breast cancer, status post left lumpectomy and gastric bypass with chronic diarrhea since 2009, also with a history of autonomic dysfunction with severe orthostatic hypotension presents to the Emergency Room. She actually presented 2 days prior to this admission after a fall. She was discharged home at that time and she now returns after having had a syncopal event. She reports that she was sitting on her commode and passed out. She does have chronic diarrhea, which is unchanged. She has chronic peripheral neuropathy with pain and is opiate dependent. This is unchanged. She denies fevers, chills, or any other specific symptoms. There was no head injury during of fall, no loss of consciousness.  HOSPITAL COURSE BY PROBLEM:  1.  Syncopal event, likely due to autonomic dysfunction with chronic orthostatic hypotension. The patient had recently been taken off of her medications for orthostatic hypotension due to  hypertension. These medications were resumed and at rest, she is slightly hypertensive, but her orthostatics are slightly improved. Unfortunately, the patient has a long history of autonomic dysfunction with multiple similar events. No further workup was done for this syncopal event, as it seemed clearly attributable to her autonomic dysfunction. She is being discharged home with home health nursing and home health physical therapy. She has been restarted on midodrine and Florinef, which will hopefully help her autonomic symptoms in the future.  2.  Depression and anxiety: The patient was clearly tearful and depressed during her hospitalization. Psychiatry was consulted for further evaluation. Her medications were changed. Mirtazapine was added each night to help with appetite as well as mood and the dose of Cymbalta was decreased from 60 mg to 30 mg daily. It was also noted that Cymbalta can contribute to diarrhea. So, hopefully, this will improve that chronic condition for her. She was strongly encouraged to continue following up with primary care and/or psychiatry for depression. She was also referred to the [facility] program, as she is mostly home and bedbound and would benefit from increased socialization as well as close medical management.  3.  Chronic pain: The patient does have a peripheral neuropathy after chemotherapy and is on high-dose, short-acting opiates. We attempted to decrease the dose of opiates during the hospitalization, which the patient did not tolerate very well. She continues on Lyrica. She continues on her prior opiate regimen, but was strongly encouraged to taper off of these medications, as they are certainly exacerbating her autonomic dysfunction and orthostatic hypotension contributing to episodes of syncope and fall.  4.  History of  breast cancer. She continues on anastrozole.  5.  Gastroesophageal reflux disease. She continues on a PPI and simethicone for comfort.   DISCHARGE  PHYSICAL EXAMINATION: VITAL SIGNS: Temperature 98.6, pulse 74, respirations 20, blood pressure 151/92. Oxygenation 100% on room air.  GENERAL: No acute distress.  RESPIRATORY: Lungs are clear to auscultation bilaterally with good air movement.  CARDIOVASCULAR: Regular rate and rhythm. No murmurs, rubs, or gallops. No peripheral edema. Peripheral pulses 2+.  ABDOMEN: Soft, nontender, nondistended. No guarding, no rebound. Bowel sounds are normal.  PSYCHIATRIC: The patient is depressed, tearful. She does not show any suicidal ideation. She is alert and oriented x4 with good insight into her clinical condition.  MUSCULOSKELETAL: On evoked strength testing, she has 5/5 strength throughout; however, she is very reluctant to get up out of the bed.   DISCHARGE LABORATORY DATA: Sodium 141, potassium 4.1, chloride 108, bicarbonate 25, BUN 15, creatinine 1.03, glucose 78. LFTs normal. Troponin less than 0.02 x 2. TSH 1.25 which is normal. White blood cells 5.8, hemoglobin 11.7, platelets 174,000, MCV is 89.   DISCHARGE MEDICATIONS: 1.  Anastrozole 1 mg 1 tablet daily.  2.  Vitamin C 500 mg 1 tablet daily.  3.  Vitamin D 1000 International Units 1 capsule once a day.  4.  Fluticasone nasal spray 500 mcg/inhalation 1 spray once a day.  5.  Simethicone 80 mg 1 tablet every 6 hours as needed for gas.  6.  Fludrocortisone 0.1 mg 2 tablets once a day.  7.  Midodrine 2.5 mg 1 tablet 3 times a day.  8.  Oxycodone 10 mg 1.5 tablets orally every 6 hours as needed for pain. 9.  Aspirin 81 mg 1 tablet once a day.  10.  Clonazepam 0.5 mg 1 tablet twice a day as needed for anxiety. 11.  Serax 150 mg 1 capsule once a day.  12.  Lidocaine topical 5% film, apply to affected area once a day.  13.  Loperamide 2 mg 1 capsule 4 times a day as needed for diarrhea. 14.  Omeprazole 40 mg 1 tablet once a day.  15.  Lyrica 150 mg 1 capsule 3 times a day.  16.  Potassium chloride 10 mEq 2 capsules orally twice a day.  17.   Spironolactone 25 mg 1 tablet once a day.  18.  Tizanidine 2 mg 1 tablet once a day.  19.  Duloxetine 30 mg 1 tablet once a day.  20.  Mirtazapine 15 mg 1 tablet once a day at bedtime.   CONDITION ON DISCHARGE: Fair.  DISPOSITION: The patient is being discharged to home with home health physical therapy nurse, nurse's aide, and Education officer, museum. There is significant concern that this patient is not safe at home. Skilled nursing facility placement was recommended, but she declined. This patient is at high risk for re-admission due to multiple medical concerns. She has been referred to the [facility] program for closer medical monitoring as well as increased social interaction. This process will take 6 to 8 weeks if she does decide to join the [facility] program.   DIET: Regular diet.   ACTIVITY LIMITATIONS: None. Activity is as tolerated.  TIMEFRAME FOR FOLLOWUP: Please follow up within 1 to 2 weeks with your primary care physician.  TIME SPENT ON DISCHARGE: 45 minutes.    ____________________________ Earleen Newport. Volanda Napoleon, MD cpw:sw D: 01/12/2015 09:18:53 ET T: 01/12/2015 13:50:43 ET JOB#: [id number]  cc: Barnetta Chapel P. Volanda Napoleon, MD, <Dictator> Dr. Noe Gens with [facility]  MD ELECTRONICALLY SIGNED 01/17/2015 10:54

## 2015-05-23 ENCOUNTER — Emergency Department: Payer: Medicare Other

## 2015-05-23 ENCOUNTER — Encounter: Payer: Self-pay | Admitting: Emergency Medicine

## 2015-05-23 ENCOUNTER — Observation Stay (HOSPITAL_BASED_OUTPATIENT_CLINIC_OR_DEPARTMENT_OTHER)
Admit: 2015-05-23 | Discharge: 2015-05-23 | Disposition: A | Discharge status: Home / Self Care | Payer: Medicare Other | Attending: Specialist | Admitting: Specialist

## 2015-05-23 ENCOUNTER — Observation Stay
Admission: EM | Admit: 2015-05-23 | Discharge: 2015-05-26 | Disposition: A | Discharge status: Home / Self Care | Payer: Medicare Other | Attending: Specialist | Admitting: Specialist

## 2015-05-23 DIAGNOSIS — R1030 Lower abdominal pain, unspecified: Secondary | ICD-10-CM | POA: Diagnosis not present

## 2015-05-23 DIAGNOSIS — I10 Essential (primary) hypertension: Secondary | ICD-10-CM | POA: Diagnosis not present

## 2015-05-23 DIAGNOSIS — R0602 Shortness of breath: Secondary | ICD-10-CM | POA: Diagnosis not present

## 2015-05-23 DIAGNOSIS — D649 Anemia, unspecified: Secondary | ICD-10-CM | POA: Diagnosis not present

## 2015-05-23 DIAGNOSIS — I482 Chronic atrial fibrillation: Secondary | ICD-10-CM | POA: Insufficient documentation

## 2015-05-23 DIAGNOSIS — Z9884 Bariatric surgery status: Secondary | ICD-10-CM | POA: Diagnosis not present

## 2015-05-23 DIAGNOSIS — F419 Anxiety disorder, unspecified: Secondary | ICD-10-CM | POA: Diagnosis not present

## 2015-05-23 DIAGNOSIS — R339 Retention of urine, unspecified: Secondary | ICD-10-CM | POA: Diagnosis not present

## 2015-05-23 DIAGNOSIS — R05 Cough: Secondary | ICD-10-CM | POA: Diagnosis not present

## 2015-05-23 DIAGNOSIS — I071 Rheumatic tricuspid insufficiency: Secondary | ICD-10-CM | POA: Diagnosis not present

## 2015-05-23 DIAGNOSIS — Z87442 Personal history of urinary calculi: Secondary | ICD-10-CM | POA: Diagnosis not present

## 2015-05-23 DIAGNOSIS — R1084 Generalized abdominal pain: Secondary | ICD-10-CM | POA: Insufficient documentation

## 2015-05-23 DIAGNOSIS — F329 Major depressive disorder, single episode, unspecified: Secondary | ICD-10-CM | POA: Insufficient documentation

## 2015-05-23 DIAGNOSIS — R109 Unspecified abdominal pain: Secondary | ICD-10-CM | POA: Diagnosis present

## 2015-05-23 DIAGNOSIS — K5730 Diverticulosis of large intestine without perforation or abscess without bleeding: Secondary | ICD-10-CM | POA: Insufficient documentation

## 2015-05-23 DIAGNOSIS — I4891 Unspecified atrial fibrillation: Secondary | ICD-10-CM

## 2015-05-23 DIAGNOSIS — I509 Heart failure, unspecified: Secondary | ICD-10-CM | POA: Insufficient documentation

## 2015-05-23 DIAGNOSIS — R072 Precordial pain: Secondary | ICD-10-CM | POA: Diagnosis not present

## 2015-05-23 DIAGNOSIS — R55 Syncope and collapse: Secondary | ICD-10-CM | POA: Insufficient documentation

## 2015-05-23 DIAGNOSIS — Z8249 Family history of ischemic heart disease and other diseases of the circulatory system: Secondary | ICD-10-CM | POA: Insufficient documentation

## 2015-05-23 DIAGNOSIS — Z79899 Other long term (current) drug therapy: Secondary | ICD-10-CM | POA: Insufficient documentation

## 2015-05-23 DIAGNOSIS — E559 Vitamin D deficiency, unspecified: Secondary | ICD-10-CM | POA: Diagnosis not present

## 2015-05-23 DIAGNOSIS — R1013 Epigastric pain: Secondary | ICD-10-CM | POA: Diagnosis not present

## 2015-05-23 DIAGNOSIS — I951 Orthostatic hypotension: Secondary | ICD-10-CM | POA: Diagnosis not present

## 2015-05-23 DIAGNOSIS — I371 Nonrheumatic pulmonary valve insufficiency: Secondary | ICD-10-CM | POA: Insufficient documentation

## 2015-05-23 DIAGNOSIS — G8929 Other chronic pain: Secondary | ICD-10-CM | POA: Diagnosis not present

## 2015-05-23 DIAGNOSIS — G629 Polyneuropathy, unspecified: Secondary | ICD-10-CM | POA: Insufficient documentation

## 2015-05-23 DIAGNOSIS — R52 Pain, unspecified: Secondary | ICD-10-CM

## 2015-05-23 DIAGNOSIS — Z87891 Personal history of nicotine dependence: Secondary | ICD-10-CM | POA: Insufficient documentation

## 2015-05-23 DIAGNOSIS — R112 Nausea with vomiting, unspecified: Secondary | ICD-10-CM | POA: Insufficient documentation

## 2015-05-23 DIAGNOSIS — N281 Cyst of kidney, acquired: Secondary | ICD-10-CM | POA: Insufficient documentation

## 2015-05-23 DIAGNOSIS — I340 Nonrheumatic mitral (valve) insufficiency: Secondary | ICD-10-CM

## 2015-05-23 DIAGNOSIS — Z90710 Acquired absence of both cervix and uterus: Secondary | ICD-10-CM | POA: Diagnosis not present

## 2015-05-23 DIAGNOSIS — Z853 Personal history of malignant neoplasm of breast: Secondary | ICD-10-CM | POA: Insufficient documentation

## 2015-05-23 DIAGNOSIS — Z7982 Long term (current) use of aspirin: Secondary | ICD-10-CM | POA: Insufficient documentation

## 2015-05-23 DIAGNOSIS — I429 Cardiomyopathy, unspecified: Secondary | ICD-10-CM | POA: Insufficient documentation

## 2015-05-23 DIAGNOSIS — R079 Chest pain, unspecified: Secondary | ICD-10-CM

## 2015-05-23 HISTORY — DX: Malignant (primary) neoplasm, unspecified: C80.1

## 2015-05-23 LAB — TROPONIN I: Troponin I: <0.03 ng/mL (ref <0.031)

## 2015-05-23 LAB — URINALYSIS COMPLETE WITH MICROSCOPIC (ARMC ONLY)
Bacteria, UA: NONE SEEN
Bilirubin Urine: NEGATIVE
Glucose, UA: NEGATIVE mg/dL
Hgb urine dipstick: NEGATIVE
KETONES UR: NEGATIVE mg/dL
Leukocytes, UA: NEGATIVE
Nitrite: NEGATIVE
PH: 6 (ref 5.0–8.0)
PROTEIN: NEGATIVE mg/dL
SQUAMOUS EPITHELIAL / LPF: NONE SEEN
Specific Gravity, Urine: 1.015 (ref 1.005–1.030)

## 2015-05-23 LAB — BASIC METABOLIC PANEL
Anion gap: 9 (ref 5–15)
BUN: 15 mg/dL (ref 6–20)
CO2: 27 mmol/L (ref 22–32)
Calcium: 9.8 mg/dL (ref 8.9–10.3)
Chloride: 103 mmol/L (ref 101–111)
Creatinine, Ser: 1.02 mg/dL — ABNORMAL HIGH (ref 0.44–1.00)
Glucose, Bld: 171 mg/dL — ABNORMAL HIGH (ref 65–99)
POTASSIUM: 3.8 mmol/L (ref 3.5–5.1)
SODIUM: 139 mmol/L (ref 135–145)

## 2015-05-23 LAB — CBC
HCT: 34.3 % — ABNORMAL LOW (ref 35.0–47.0)
Hemoglobin: 11.2 g/dL — ABNORMAL LOW (ref 12.0–16.0)
MCH: 29.3 pg (ref 26.0–34.0)
MCHC: 32.7 g/dL (ref 32.0–36.0)
MCV: 89.5 fL (ref 80.0–100.0)
Platelets: 184 10*3/uL (ref 150–440)
RBC: 3.83 MIL/uL (ref 3.80–5.20)
RDW: 14.1 % (ref 11.5–14.5)
WBC: 9.9 10*3/uL (ref 3.6–11.0)

## 2015-05-23 LAB — HEPATIC FUNCTION PANEL
ALT: 6 U/L — AB (ref 14–54)
AST: 23 U/L (ref 15–41)
Albumin: 4 g/dL (ref 3.5–5.0)
Alkaline Phosphatase: 96 U/L (ref 38–126)
Bilirubin, Direct: <0.1 mg/dL — ABNORMAL LOW (ref 0.1–0.5)
TOTAL PROTEIN: 7.5 g/dL (ref 6.5–8.1)
Total Bilirubin: 0.6 mg/dL (ref 0.3–1.2)

## 2015-05-23 LAB — LACTIC ACID, PLASMA
LACTIC ACID, VENOUS: 2.6 mmol/L — AB (ref 0.5–2.0)
LACTIC ACID, VENOUS: 1.6 mmol/L (ref 0.5–2.0)

## 2015-05-23 LAB — LIPASE, BLOOD: Lipase: 12 U/L — ABNORMAL LOW (ref 22–51)

## 2015-05-23 MED ORDER — DILTIAZEM HCL 25 MG/5ML IV SOLN
10.0000 mg | Freq: Four times a day (QID) | INTRAVENOUS | Status: DC | PRN
  Administered 2015-05-23: 10 mg via INTRAVENOUS
  Filled 2015-05-23: qty 5

## 2015-05-23 MED ORDER — TIZANIDINE HCL 4 MG PO TABS
2.0000 mg | ORAL_TABLET | Freq: Three times a day (TID) | ORAL | Status: DC
  Administered 2015-05-23 – 2015-05-26 (×9): 2 mg via ORAL
  Filled 2015-05-23 (×9): qty 1

## 2015-05-23 MED ORDER — ACETAMINOPHEN 325 MG PO TABS: 650.0000 mg | ORAL_TABLET | Freq: Four times a day (QID) | ORAL | Status: DC | PRN

## 2015-05-23 MED ORDER — AZELASTINE HCL 0.1 % NA SOLN
1.0000 | Freq: Two times a day (BID) | NASAL | Status: DC
  Administered 2015-05-23 – 2015-05-26 (×7): 1 via NASAL
  Filled 2015-05-23: qty 30

## 2015-05-23 MED ORDER — ONDANSETRON 4 MG PO TBDP
4.0000 mg | ORAL_TABLET | Freq: Once | ORAL | Status: AC | PRN
  Administered 2015-05-23: 4 mg via ORAL

## 2015-05-23 MED ORDER — SPIRONOLACTONE 25 MG PO TABS
25.0000 mg | ORAL_TABLET | Freq: Every day | ORAL | Status: DC
  Administered 2015-05-23 – 2015-05-26 (×4): 25 mg via ORAL
  Filled 2015-05-23 (×4): qty 1

## 2015-05-23 MED ORDER — MORPHINE SULFATE ER 15 MG PO TBCR
15.0000 mg | EXTENDED_RELEASE_TABLET | Freq: Three times a day (TID) | ORAL | Status: DC
  Administered 2015-05-23 – 2015-05-26 (×8): 15 mg via ORAL
  Filled 2015-05-23 (×8): qty 1

## 2015-05-23 MED ORDER — MIRTAZAPINE 15 MG PO TABS
15.0000 mg | ORAL_TABLET | Freq: Every day | ORAL | Status: DC
  Administered 2015-05-23 – 2015-05-25 (×3): 15 mg via ORAL
  Filled 2015-05-23 (×3): qty 1

## 2015-05-23 MED ORDER — MIDODRINE HCL 5 MG PO TABS
5.0000 mg | ORAL_TABLET | Freq: Three times a day (TID) | ORAL | Status: DC
  Administered 2015-05-23 – 2015-05-26 (×7): 5 mg via ORAL
  Filled 2015-05-23 (×7): qty 1

## 2015-05-23 MED ORDER — FERROUS SULFATE 325 (65 FE) MG PO TABS
325.0000 mg | ORAL_TABLET | Freq: Every day | ORAL | Status: DC
  Administered 2015-05-24 – 2015-05-26 (×3): 325 mg via ORAL
  Filled 2015-05-23 (×3): qty 1

## 2015-05-23 MED ORDER — OXYCODONE HCL 5 MG PO TABS
15.0000 mg | ORAL_TABLET | Freq: Four times a day (QID) | ORAL | Status: DC | PRN
  Administered 2015-05-23 – 2015-05-26 (×6): 15 mg via ORAL
  Filled 2015-05-23 (×6): qty 3

## 2015-05-23 MED ORDER — SODIUM CHLORIDE 0.9 % IV SOLN: INTRAVENOUS | Status: DC

## 2015-05-23 MED ORDER — FLUDROCORTISONE ACETATE 0.1 MG PO TABS
0.2000 mg | ORAL_TABLET | Freq: Every day | ORAL | Status: DC
  Administered 2015-05-23 – 2015-05-26 (×4): 0.2 mg via ORAL
  Filled 2015-05-23 (×4): qty 2

## 2015-05-23 MED ORDER — METOPROLOL TARTRATE 25 MG PO TABS
25.0000 mg | ORAL_TABLET | Freq: Two times a day (BID) | ORAL | Status: DC
Start: 2015-05-23 — End: 2015-05-23
  Administered 2015-05-23: 25 mg via ORAL
  Filled 2015-05-23: qty 1

## 2015-05-23 MED ORDER — SODIUM CHLORIDE 0.9 % IV SOLN
INTRAVENOUS | Status: DC
  Administered 2015-05-23 – 2015-05-26 (×4): via INTRAVENOUS

## 2015-05-23 MED ORDER — ONDANSETRON HCL 4 MG/2ML IJ SOLN
4.0000 mg | Freq: Four times a day (QID) | INTRAMUSCULAR | Status: DC | PRN
  Administered 2015-05-23: 4 mg via INTRAVENOUS
  Filled 2015-05-23: qty 2

## 2015-05-23 MED ORDER — SERTRALINE HCL 50 MG PO TABS
50.0000 mg | ORAL_TABLET | Freq: Every day | ORAL | Status: DC
  Administered 2015-05-23 – 2015-05-26 (×4): 50 mg via ORAL
  Filled 2015-05-23 (×4): qty 1

## 2015-05-23 MED ORDER — IOHEXOL 350 MG/ML SOLN
100.0000 mL | Freq: Once | INTRAVENOUS | Status: AC | PRN
  Administered 2015-05-23: 100 mL via INTRAVENOUS

## 2015-05-23 MED ORDER — SODIUM CHLORIDE 0.9 % IV BOLUS (SEPSIS)
1000.0000 mL | Freq: Once | INTRAVENOUS | Status: AC
  Administered 2015-05-23: 1000 mL via INTRAVENOUS

## 2015-05-23 MED ORDER — CLONAZEPAM 0.5 MG PO TABS
0.5000 mg | ORAL_TABLET | Freq: Two times a day (BID) | ORAL | Status: DC | PRN
  Administered 2015-05-23 – 2015-05-25 (×3): 0.5 mg via ORAL
  Filled 2015-05-23 (×3): qty 1

## 2015-05-23 MED ORDER — ANASTROZOLE 1 MG PO TABS
1.0000 mg | ORAL_TABLET | Freq: Every day | ORAL | Status: DC
  Administered 2015-05-23 – 2015-05-26 (×4): 1 mg via ORAL
  Filled 2015-05-23 (×4): qty 1

## 2015-05-23 MED ORDER — ONDANSETRON HCL 4 MG PO TABS: 4.0000 mg | ORAL_TABLET | Freq: Four times a day (QID) | ORAL | Status: DC | PRN

## 2015-05-23 MED ORDER — ACETAMINOPHEN 650 MG RE SUPP: 650.0000 mg | Freq: Four times a day (QID) | RECTAL | Status: DC | PRN

## 2015-05-23 MED ORDER — IOHEXOL 240 MG/ML SOLN
25.0000 mL | INTRAMUSCULAR | Status: DC
  Administered 2015-05-23: 25 mL via ORAL

## 2015-05-23 MED ORDER — PREGABALIN 75 MG PO CAPS
150.0000 mg | ORAL_CAPSULE | Freq: Three times a day (TID) | ORAL | Status: DC
  Administered 2015-05-23 – 2015-05-26 (×9): 150 mg via ORAL
  Filled 2015-05-23 (×9): qty 2

## 2015-05-23 MED ORDER — VITAMIN D 1000 UNITS PO TABS
1000.0000 [IU] | ORAL_TABLET | Freq: Every day | ORAL | Status: DC
  Administered 2015-05-23 – 2015-05-26 (×4): 1000 [IU] via ORAL
  Filled 2015-05-23 (×4): qty 1

## 2015-05-23 MED ORDER — ONDANSETRON HCL 4 MG/2ML IJ SOLN
4.0000 mg | Freq: Once | INTRAMUSCULAR | Status: AC
  Administered 2015-05-23: 4 mg via INTRAVENOUS
  Filled 2015-05-23: qty 2

## 2015-05-23 MED ORDER — LOPERAMIDE HCL 2 MG PO CAPS
2.0000 mg | ORAL_CAPSULE | Freq: Four times a day (QID) | ORAL | Status: DC | PRN
  Administered 2015-05-26: 2 mg via ORAL
  Filled 2015-05-23 (×2): qty 1

## 2015-05-23 MED ORDER — ENOXAPARIN SODIUM 40 MG/0.4ML ~~LOC~~ SOLN
40.0000 mg | SUBCUTANEOUS | Status: DC
  Administered 2015-05-23 – 2015-05-25 (×3): 40 mg via SUBCUTANEOUS
  Filled 2015-05-23 (×3): qty 0.4

## 2015-05-23 MED ORDER — POTASSIUM CHLORIDE ER 10 MEQ PO TBCR
20.0000 meq | EXTENDED_RELEASE_TABLET | Freq: Two times a day (BID) | ORAL | Status: DC
  Administered 2015-05-23 – 2015-05-26 (×7): 20 meq via ORAL
  Filled 2015-05-23 (×15): qty 2

## 2015-05-23 MED ORDER — MORPHINE SULFATE 4 MG/ML IJ SOLN
4.0000 mg | Freq: Once | INTRAMUSCULAR | Status: AC
  Administered 2015-05-23: 4 mg via INTRAVENOUS
  Filled 2015-05-23: qty 1

## 2015-05-23 MED ORDER — FLUTICASONE PROPIONATE 50 MCG/ACT NA SUSP
1.0000 | Freq: Every day | NASAL | Status: DC
  Administered 2015-05-23 – 2015-05-26 (×4): 1 via NASAL
  Filled 2015-05-23: qty 16

## 2015-05-23 MED ORDER — PANTOPRAZOLE SODIUM 40 MG PO TBEC
40.0000 mg | DELAYED_RELEASE_TABLET | Freq: Every day | ORAL | Status: DC
  Administered 2015-05-23 – 2015-05-26 (×4): 40 mg via ORAL
  Filled 2015-05-23 (×5): qty 1

## 2015-05-23 MED ORDER — CHOLECALCIFEROL 250 MCG (10000 UT) PO CAPS: 1000.0000 [IU] | ORAL_CAPSULE | Freq: Every day | ORAL | Status: DC

## 2015-05-23 MED ORDER — ONDANSETRON 4 MG PO TBDP
ORAL_TABLET | ORAL | Status: AC
  Administered 2015-05-23: 4 mg via ORAL
  Filled 2015-05-23: qty 1

## 2015-05-23 MED ORDER — LORATADINE 10 MG PO TABS: 10.0000 mg | ORAL_TABLET | Freq: Every day | ORAL | Status: DC | PRN

## 2015-05-23 MED ORDER — ASPIRIN EC 81 MG PO TBEC
81.0000 mg | DELAYED_RELEASE_TABLET | Freq: Every day | ORAL | Status: DC
  Administered 2015-05-23 – 2015-05-26 (×4): 81 mg via ORAL
  Filled 2015-05-23 (×4): qty 1

## 2015-05-23 MED ORDER — VITAMIN C 500 MG PO TABS
500.0000 mg | ORAL_TABLET | Freq: Every day | ORAL | Status: DC
  Administered 2015-05-23 – 2015-05-26 (×4): 500 mg via ORAL
  Filled 2015-05-23 (×4): qty 1

## 2015-05-23 MED ORDER — VITAMIN B-12 1000 MCG PO TABS
1000.0000 ug | ORAL_TABLET | Freq: Every day | ORAL | Status: DC
  Administered 2015-05-23 – 2015-05-26 (×4): 1000 ug via ORAL
  Filled 2015-05-23 (×4): qty 1

## 2015-05-23 NOTE — H&P (Signed)
[facility] [place] NAME: Lindsay [name]    MR#:  [id number]  DATE OF BIRTH:  10-09-1956  DATE OF ADMISSION:  05/23/2015  PRIMARY CARE PHYSICIAN:  [facility] in [place].    REQUESTING/REFERRING PHYSICIAN: Dr. Lavonia Drafts  CHIEF COMPLAINT:  Abdominal, chest pain and noted to be in a. Fib w/ RVR  HISTORY OF PRESENT ILLNESS:  Lindsay [name]  is a 59 y.o. female with a known history of breast cancer, neuropathy, chronic pain, depression/anxiety, orthostatic hypotension, chronic anemia, history of CHF, presented to the hospital due to vague abdominal and chest pain. Patient says that she ate a [employer] sent which about 2 days ago and since then has not felt well. She's been having lower abdominal pain associated with some nausea and vomiting. She denies any diarrhea. Admits to some chills but no documented fever. Since his symptoms were not improving she came to the hospital for further evaluation. Patient presented to be in rapid A. fib with RVR and is not on long-term anticoagulation therefore underwent a CT scan abdomen pelvis which showed no acute abnormalities or any evidence of acute ischemic colitis. Patient still continues to have some abdominal pain and vague chest pain and heart rates is still uncontrolled if the hospitalist services were contacted further treatment and evaluation.  PAST MEDICAL HISTORY:   Past Medical History  Diagnosis Date  . HX: breast cancer     right  . Peripheral neuropathy   . Orthostatic hypotension   . Depression   . Vitamin D deficiency   . Anemia   . Depression   . CHF (congestive heart failure)   . Muscle spasm   . Chronic cough   . C. difficile diarrhea 10/2011  . Kidney stones 2013  . Cancer     PAST SURGICAL HISTORY:   Past Surgical History  Procedure Laterality Date  . Breast lumpectomy      right @ [facility]  . Lithotripsy  jan 2013    SOCIAL HISTORY:   History   Substance Use Topics  . Smoking status: Former Smoker -- 0.25 packs/day for 1 years    Types: Cigarettes    Quit date: 11/10/1985  . Smokeless tobacco: Not on file  . Alcohol Use: No    FAMILY HISTORY:   Family History  Problem Relation Age of Onset  . Heart attack Father     DRUG ALLERGIES:  No Known Allergies  REVIEW OF SYSTEMS:   Review of Systems  Constitutional: Positive for chills. Negative for fever and weight loss.  HENT: Negative for congestion, nosebleeds and tinnitus.   Eyes: Negative for blurred vision, double vision and redness.  Respiratory: Negative for cough, hemoptysis and shortness of breath.   Cardiovascular: Positive for chest pain. Negative for orthopnea, leg swelling and PND.  Gastrointestinal: Positive for nausea and abdominal pain (generalized). Negative for vomiting, diarrhea, blood in stool and melena.  Genitourinary: Negative for dysuria, urgency and hematuria.  Musculoskeletal: Negative for joint pain and falls.  Skin: Negative for rash.  Neurological: Positive for dizziness. Negative for tingling, sensory change, focal weakness, seizures, weakness and headaches.  Endo/Heme/Allergies: Negative for polydipsia. Does not bruise/bleed easily.  Psychiatric/Behavioral: Negative for depression and memory loss. The patient is not nervous/anxious.     MEDICATIONS AT HOME:   Prior to Admission medications   Medication Sig Start Date End Date Taking? Authorizing Provider  anastrozole (ARIMIDEX) 1 MG tablet Take 1 mg by mouth daily.  Yes Historical Provider, MD  aspirin EC 81 MG tablet Take 81 mg by mouth daily.   Yes Historical Provider, MD  Cholecalciferol 10000 UNITS CAPS Take 1,000 Units by mouth daily.   Yes Historical Provider, MD  clonazePAM (KLONOPIN) 0.5 MG tablet Take 0.5 mg by mouth 2 (two) times daily as needed for anxiety.   Yes Historical Provider, MD  fludrocortisone (FLORINEF) 0.1 MG tablet Take 0.2 mg by mouth daily.   Yes Historical  Provider, MD  fluticasone (FLONASE) 50 MCG/ACT nasal spray Place 1 spray into the nose daily.    Yes Historical Provider, MD  lidocaine (LIDODERM) 5 % Place 1 patch onto the skin daily. Remove & Discard patch within 12 hours or as directed by MD   Yes Historical Provider, MD  loperamide (IMODIUM) 2 MG capsule Take 2 mg by mouth 4 (four) times daily as needed for diarrhea or loose stools.   Yes Historical Provider, MD  midodrine (PROAMATINE) 5 MG tablet Take 5 mg by mouth 3 (three) times daily with meals.   Yes Historical Provider, MD  mirtazapine (REMERON) 15 MG tablet Take 15 mg by mouth at bedtime.     Yes Historical Provider, MD  morphine (MS CONTIN) 15 MG 12 hr tablet Take 15 mg by mouth every 8 (eight) hours.   Yes Historical Provider, MD  omeprazole (PRILOSEC) 40 MG capsule Take 40 mg by mouth daily.   Yes Historical Provider, MD  oxyCODONE (ROXICODONE) 15 MG immediate release tablet Take 15 mg by mouth every 6 (six) hours as needed for pain.   Yes Historical Provider, MD  potassium chloride (K-DUR) 10 MEQ tablet Take 20 mEq by mouth 2 (two) times daily.   Yes Historical Provider, MD  pregabalin (LYRICA) 150 MG capsule Take 150 mg by mouth 3 (three) times daily.   Yes Historical Provider, MD  sertraline (ZOLOFT) 50 MG tablet Take 50 mg by mouth daily.   Yes Historical Provider, MD  spironolactone (ALDACTONE) 25 MG tablet Take 25 mg by mouth daily.   Yes Historical Provider, MD  tiZANidine (ZANAFLEX) 2 MG tablet Take 2 mg by mouth 3 (three) times daily.   Yes Historical Provider, MD  vitamin C (ASCORBIC ACID) 500 MG tablet Take 500 mg by mouth daily.   Yes Historical Provider, MD  acetaminophen (TYLENOL ARTHRITIS PAIN) 650 MG CR tablet Take 650 mg by mouth every 8 (eight) hours as needed.      Historical Provider, MD  azelastine (ASTELIN) 137 MCG/SPRAY nasal spray Place 1 spray into the nose 2 (two) times daily. Use in each nostril as directed    Historical Provider, MD  baclofen (LIORESAL) 10  MG tablet Take 5 mg by mouth 3 (three) times daily.     Historical Provider, MD  carvedilol (COREG) 3.125 MG tablet Take 3.125 mg by mouth 2 (two) times daily with a meal.     Historical Provider, MD  citalopram (CELEXA) 20 MG tablet Take 20 mg by mouth daily.      Historical Provider, MD  cyclobenzaprine (FLEXERIL) 10 MG tablet Take 10 mg by mouth as needed.      Historical Provider, MD  desloratadine (CLARINEX) 5 MG tablet Take 5 mg by mouth daily.      Historical Provider, MD  ferrous sulfate 325 (65 FE) MG tablet Take 325 mg by mouth daily with breakfast.      Historical Provider, MD  Flora-Q (FLORA-Q) CAPS Take 1 capsule by mouth 2 (two) times daily.  Historical Provider, MD  gabapentin (NEURONTIN) 600 MG tablet Take 600 mg by mouth 3 (three) times daily.      Historical Provider, MD  guaiFENesin (ROBITUSSIN) 100 MG/5ML liquid Take 200 mg by mouth 3 (three) times daily as needed.      Historical Provider, MD  loratadine (CLARITIN) 10 MG tablet Take 10 mg by mouth daily.      Historical Provider, MD  LORazepam (ATIVAN) 0.5 MG tablet Take 0.5 mg by mouth every 8 (eight) hours.      Historical Provider, MD  Meth-Hyo-M Bl-Na Phos-Ph Sal (URIBEL PO) Take 1 capsule by mouth as needed.    Historical Provider, MD  OLANZapine (ZYPREXA) 2.5 MG tablet Take 2.5 mg by mouth at bedtime.      Historical Provider, MD  oxyCODONE-acetaminophen (PERCOCET) 5-325 MG per tablet Take 1 tablet by mouth every 6 (six) hours as needed.      Historical Provider, MD  promethazine (PHENERGAN) 12.5 MG tablet Take 12.5 mg by mouth every 6 (six) hours as needed.      Historical Provider, MD  SIMETHICONE-80 PO Take 80 mg by mouth 3 (three) times daily as needed.     Historical Provider, MD  vitamin B-12 (CYANOCOBALAMIN) 1000 MCG tablet Take 1,000 mcg by mouth once a week.      Historical Provider, MD      VITAL SIGNS:  Blood pressure 151/117, pulse 124, temperature 97.7 F (36.5 C), temperature source Oral, resp. rate  16, height 5\' 6"  (1.676 m), weight 53.071 kg (117 lb), SpO2 100 %.  PHYSICAL EXAMINATION:  Physical Exam  GENERAL:  59 y.o.-year-old patient lying in the bed with no acute distress.  EYES: Pupils equal, round, reactive to light and accommodation. No scleral icterus. Extraocular muscles intact.  HEENT: Head atraumatic, normocephalic. Oropharynx and nasopharynx clear. No oropharyngeal erythema, moist oral mucosa  NECK:  Supple, no jugular venous distention. No thyroid enlargement, no tenderness.  LUNGS: Normal breath sounds bilaterally, no wheezing, rales, rhonchi. No use of accessory muscles of respiration.  CARDIOVASCULAR: S1, S2 Irregular rate. No murmurs, rubs, gallops, clicks.  ABDOMEN: Soft, Tender in lower abdomen, no rebound rigidity, nondistended. Bowel sounds present. No organomegaly or mass.  EXTREMITIES: No pedal edema, cyanosis, or clubbing. + 2 pedal & radial pulses b/l.   NEUROLOGIC: Cranial nerves II through XII are intact. No focal Motor or sensory deficits appreciated b/l PSYCHIATRIC: The patient is alert and oriented x 3. Anxious SKIN: No obvious rash, lesion, or ulcer.   LABORATORY PANEL:   CBC  Recent Labs Lab 05/23/15 0505  WBC 9.9  HGB 11.2*  HCT 34.3*  PLT 184   ------------------------------------------------------------------------------------------------------------------  Chemistries   Recent Labs Lab 05/23/15 0505  NA 139  K 3.8  CL 103  CO2 27  GLUCOSE 171*  BUN 15  CREATININE 1.02*  CALCIUM 9.8  AST 23  ALT 6*  ALKPHOS 96  BILITOT 0.6   ------------------------------------------------------------------------------------------------------------------  Cardiac Enzymes  Recent Labs Lab 05/23/15 0505  TROPONINI <0.03   ------------------------------------------------------------------------------------------------------------------  RADIOLOGY:  Dg Chest 2 View  05/23/2015   CLINICAL DATA:  LEFT chest pain and shortness of breath  tonight. Syncopal episode. History of breast cancer, CHF, ortho static hypotension.  EXAM: CHEST  2 VIEW  COMPARISON:  Chest radiograph January 12, 2015  FINDINGS: Cardiomediastinal silhouette is unremarkable. The lungs are clear without pleural effusions or focal consolidations. Trachea projects midline and there is no pneumothorax. Soft tissue planes and included osseous structures are non-suspicious. Surgical  clips RIGHT axilla.  IMPRESSION: No active cardiopulmonary disease.   Electronically Signed   By: Elon Alas M.D.   On: 05/23/2015 05:02   Ct Angio Abd/pel W/ And/or W/o  05/23/2015   CLINICAL DATA:  59 year old female with epigastric abdominal pain associated with nausea and vomiting as well as substernal chest pain and mild shortness of breath. Medical history includes breast cancer, renal stones and prior hysterectomy.  EXAM: CTA ABDOMEN AND PELVIS wITHOUT AND WITH CONTRAST  TECHNIQUE: Multidetector CT imaging of the abdomen and pelvis was performed using the standard protocol during bolus administration of intravenous contrast. Multiplanar reconstructed images and MIPs were obtained and reviewed to evaluate the vascular anatomy.  CONTRAST:  163mL OMNIPAQUE IOHEXOL 350 MG/ML SOLN  COMPARISON:  Prior CT scan abdomen and pelvis 11/25/2014; prior CT chest abdomen pelvis 05/18/2013  FINDINGS: VASCULAR  Aorta: Normal caliber aorta. No aneurysm common dissection or other acute abnormality. Mild scattered atherosclerotic plaque without evidence of stenosis.  Celiac: Widely patent and unremarkable. Conventional hepatic arterial anatomy. No visceral artery aneurysm.  SMA: Widely patent and unremarkable.  Renals: Single dominant renal arteries bilaterally are widely patent and unremarkable. No changes of fibromuscular dysplasia.  IMA: Widely patent and unremarkable.  Inflow: Widely patent and unremarkable.  Proximal Outflow: Visualized portions are widely patent and unremarkable. There is mild calcified  atherosclerotic plaque along the posterior wall of the common femoral arteries without associated stenosis.  Veins: No focal venous abnormality identified. An IVC filter is well positioned below the renal veins.  NON-VASCULAR  Lower Chest: The lung bases are clear. Visualized cardiac structures are within normal limits for size. No pericardial effusion. Unremarkable visualized distal thoracic esophagus.  Abdomen: Surgical changes of prior gastric bypass procedure. Widely patent anastomoses. The excluded portion of the stomach is mildly full of fluid attenuation material but not overtly distended. Unremarkable CT appearance of the spleen, adrenal glands and pancreas. Normal hepatic contour and morphology. Subcentimeter low-attenuation lesions in hepatic segment 6 are too small for accurate characterization but statistically highly likely benign cysts. High attenuation material layering within the gallbladder lumen consistent with small stones in the fundus. No intra or extrahepatic biliary ductal dilatation.  Mild renal cortical thinning bilaterally. The no evidence of hydronephrosis or enhancing renal mass. Small renal cyst right lower pole. Colonic diverticular disease without CT evidence of active inflammation. No evidence of obstruction or focal bowel wall thickening. Normal appendix in the right lower quadrant. The terminal ileum is unremarkable.  Pelvis: Surgical changes of prior hysterectomy. Unremarkable appearance of the bladder.  Bones/Soft Tissues: No acute fracture or aggressive appearing lytic or blastic osseous lesion. Incidental note is made of a lipoma within the anterior fascia of the right sartorius muscle.  Review of the MIP images confirms the above findings.  IMPRESSION: VASCULAR  1. No acute vascular abnormality. 2. Mild calcified atherosclerotic plaque without evidence of aneurysm, dissection or significant stenosis. 3. IVC filter in place. NON VASCULAR  1. No acute abnormality in the abdomen  or pelvis to explain the patient's clinical symptoms. 2. Surgical changes of gastric bypass procedure without evidence of complication or obstruction. The gastric remanent is fluid filled but not distended. This is of doubtful clinical significance. 3. Colonic diverticular disease without CT evidence of active inflammation. 4. Mild bilateral renal cortical thinning. 5. Additional ancillary findings as above without significant interval change. Signed,  Criselda Peaches, MD  [facility]  [facility]   Electronically Signed   By: Myrle Sheng  Laurence Ferrari M.D.   On: 05/23/2015 08:39     IMPRESSION AND PLAN:   59 year old female with past medical history of breast cancer, chronic pain, peripheral neuropathy, hypertension, chronic afibrillation, history of orthostatic hypotension, history of CHF who presented to the hospital due to abdominal pain nausea vomiting and also vague chest pain.   #1 abdominal pain-the exact etiology of this is unclear. Patient CT scan of the abdomen pelvis does not show any evidence of acute intra-abdominal pathology or evidence of ischemic colitis. -Continue supportive care with IV fluids, pain control, antiemetics. -We'll get gastroenterology consult. We'll place on clear liquid diet.  #2 chest pain-this is likely not cardiac in nature. We'll observe the patient on telemetry, we'll cycle her cardiac markers.  #4 atrial fibrillation with rapid ventricular response-patient's heart rates are significantly uncontrolled. -We'll add some low-dose metoprolol, as needed Cardizem for rate control. -We'll get a two-dimensional echocardiogram.  #5 chronic pain-continue MS Contin, oxycodone as needed.  #6 history of orthostatic hypotension-continue Midrin, Florinef.  #7 peripheral neuropathy-continue Lyrica.  #8 depression/anxiety-continue Zoloft, Klonopin.   All the records are reviewed and case discussed with ED  provider. Management plans discussed with the patient, family and they are in agreement.  CODE STATUS: Full  TOTAL TIME TAKING CARE OF THIS PATIENT: 50 minutes.    Henreitta Leber M.D on 05/23/2015 at 9:05 AM  Between 7am to 6pm - Pager - [phone]  After 6pm go to [email or web address] - password [email or web address] [facility]  Office  [phone]  CC: Primary care physician; Pcp Not In System

## 2015-05-23 NOTE — Progress Notes (Addendum)
Patient admitted to unit today for abdominal pain, nausea and vomiting. From home alone with nurse aides. Has weakness with ambulation, states she generally uses a walker or cane at home. Patient is orthostatic by blood pressure HR remained stable. MD was informed. Pt is currently on a clear diet tolerating well.  Medicated patient for pain twice today and once for zofran. ECHO and CT pelvis completed today. GI following patient. EGD scheduled for Monday. Enteric precautions for CDiff ordered by GI providers. Pt has not had BM today. No loose stools since admission. Pt is stable.

## 2015-05-23 NOTE — ED Notes (Signed)
Pt reports abd pain with n/v approx 4 to 5 times since Friday evening. Pt reports hx of gastric bypass surgery over 10 years ago. Pt has numerous old, healed abd scars noted.

## 2015-05-23 NOTE — Consult Note (Signed)
GI Inpatient Consult Note  Reason for Consult: abdominal pain   Attending Requesting Consult:  Dr. Verdell Carmine  History of Present Illness: Lindsay [name] is a 58 y.o. female reports that she woke up at 2pm yesterday with severe generalized abdominal pain.  She endorses nausea and vomiting up clear liquid, she denies diarrhea.  She reports taking pain meds and anti emetics without relief.  She reports she ate a few bites of a [place] and cheese sandwich, felt nauseas after and had some vomiting a while later, but did not vomit undigested food.  Her last bowel movement was earlier Friday morning and she reports that is was her normal.  She takes 2 imodium on a daily basis (more if needed) to help keep her stools formed. She denies constipation. She does have a history of C diff  In January, 2013. She failed Flagyl and was treated with vancomycin successfully.  She takes narcotics on a daily basis for chronic pain, and is managed by [facility], she sees them once a month.  She had  Gastric bypass 20 years ago.  She reports that she has symptoms about every 3 or 4 months of nausea and vomiting.  Of note Dr. Vira Agar is a consult on her during a previous hospitalization, dated June 15, 2013. The ultrasound at that time showed multiple gallstones, mild gallbladder wall thickening, no sonographic evidence otherwise of acute cholecystitis.  He performed an upper endoscopy on June 16, 2013 with the findings of normal esophagus and gastric bypass stomach.  Recommendations at that time were that due to the gallstones, pain, mild thickening of the gallbladder the surgical consolt was needed.  Two surgical consult were made and both were in agreement that due to the comorbidities at that time she was not a candidate.  It was recommended that she follow up with Dr. Vira Agar in his office a week after discharge, however there is no note in that she did complete that.  She had an IVC filter placed in 2011 because  she was not a candidate for long-term anticoagulation at that time due to a history of anemia.   Past Medical History:  Past Medical History  Diagnosis Date  . HX: breast cancer     right  . Peripheral neuropathy   . Orthostatic hypotension   . Depression   . Vitamin D deficiency   . Anemia   . Depression   . CHF (congestive heart failure)   . Muscle spasm   . Chronic cough   . C. difficile diarrhea 10/2011  . Kidney stones 2013  . Cancer     Problem List: Patient Active Problem List   Diagnosis Date Noted  . Abdominal pain 05/23/2015  . C. difficile colitis 12/12/2011  . Chest pain 11/11/2011  . Syncope 11/11/2011  . Hypotension 11/11/2011  . Cardiomyopathy 11/11/2011  . AF (atrial fibrillation) 11/11/2011    Past Surgical History: Past Surgical History  Procedure Laterality Date  . Breast lumpectomy      right @ [facility]  . Lithotripsy  jan 2013    Allergies: No Known Allergies  Home Medications: Prescriptions prior to admission  Medication Sig Dispense Refill Last Dose  . anastrozole (ARIMIDEX) 1 MG tablet Take 1 mg by mouth daily.     05/22/2015 at Unknown time  . aspirin EC 81 MG tablet Take 81 mg by mouth daily.   05/22/2015 at Unknown time  . Cholecalciferol 10000 UNITS CAPS Take 1,000 Units by mouth daily.  05/22/2015 at Unknown time  . clonazePAM (KLONOPIN) 0.5 MG tablet Take 0.5 mg by mouth 2 (two) times daily as needed for anxiety.   05/22/2015 at Unknown time  . ferrous sulfate 325 (65 FE) MG tablet Take 325 mg by mouth daily with breakfast.     05/22/2015 at Unknown time  . fludrocortisone (FLORINEF) 0.1 MG tablet Take 0.2 mg by mouth daily.   05/22/2015 at Unknown time  . fluticasone (FLONASE) 50 MCG/ACT nasal spray Place 1 spray into the nose daily.    05/22/2015 at Unknown time  . lidocaine (LIDODERM) 5 % Place 1 patch onto the skin daily. Remove & Discard patch within 12 hours or as directed by MD   05/22/2015 at Unknown time  . loperamide (IMODIUM) 2 MG  capsule Take 2 mg by mouth 4 (four) times daily as needed for diarrhea or loose stools.   05/22/2015 at Unknown time  . loratadine (CLARITIN) 10 MG tablet Take 10 mg by mouth daily as needed for allergies.    05/22/2015 at Unknown time  . midodrine (PROAMATINE) 5 MG tablet Take 5 mg by mouth 3 (three) times daily with meals.   05/22/2015 at Unknown time  . mirtazapine (REMERON) 15 MG tablet Take 15 mg by mouth at bedtime.     05/22/2015 at Unknown time  . morphine (MS CONTIN) 15 MG 12 hr tablet Take 15 mg by mouth every 8 (eight) hours.   05/22/2015 at Unknown time  . omeprazole (PRILOSEC) 40 MG capsule Take 40 mg by mouth daily.   05/22/2015 at Unknown time  . oxyCODONE (ROXICODONE) 15 MG immediate release tablet Take 15 mg by mouth every 6 (six) hours as needed for pain.   05/22/2015 at Unknown time  . potassium chloride (K-DUR) 10 MEQ tablet Take 20 mEq by mouth 2 (two) times daily.   05/22/2015 at Unknown time  . pregabalin (LYRICA) 150 MG capsule Take 150 mg by mouth 3 (three) times daily.   05/22/2015 at Unknown time  . sertraline (ZOLOFT) 50 MG tablet Take 50 mg by mouth daily.   05/22/2015 at Unknown time  . spironolactone (ALDACTONE) 25 MG tablet Take 25 mg by mouth daily.   05/22/2015 at Unknown time  . tiZANidine (ZANAFLEX) 2 MG tablet Take 2 mg by mouth 3 (three) times daily.   05/22/2015 at Unknown time  . vitamin B-12 (CYANOCOBALAMIN) 1000 MCG tablet Take 1,000 mcg by mouth daily.    05/22/2015 at Unknown time  . vitamin C (ASCORBIC ACID) 500 MG tablet Take 500 mg by mouth daily.   05/22/2015 at Unknown time   Home medication reconciliation was completed with the patient.   Scheduled Inpatient Medications:   . anastrozole  1 mg Oral Daily  . aspirin EC  81 mg Oral Daily  . azelastine  1 spray Each Nare BID  . cholecalciferol  1,000 Units Oral Daily  . enoxaparin (LOVENOX) injection  40 mg Subcutaneous Q24H  . [START ON 05/24/2015] ferrous sulfate  325 mg Oral Q breakfast  . fludrocortisone   0.2 mg Oral Daily  . fluticasone  1 spray Each Nare Daily  . metoprolol tartrate  25 mg Oral BID  . midodrine  5 mg Oral TID WC  . mirtazapine  15 mg Oral QHS  . morphine  15 mg Oral 3 times per day  . pantoprazole  40 mg Oral Daily  . potassium chloride  20 mEq Oral BID  . pregabalin  150 mg Oral TID  .  sertraline  50 mg Oral Daily  . spironolactone  25 mg Oral Daily  . tiZANidine  2 mg Oral TID  . vitamin B-12  1,000 mcg Oral Daily  . vitamin C  500 mg Oral Daily    Continuous Inpatient Infusions:   . sodium chloride      PRN Inpatient Medications:  acetaminophen **OR** acetaminophen, clonazePAM, diltiazem, loperamide, loratadine, ondansetron **OR** ondansetron (ZOFRAN) IV, oxyCODONE  Family History: family history includes Heart attack in her father.  The patient's family history is negative for inflammatory bowel disorders, GI malignancy, or solid organ transplantation.  Social History:   reports that she quit smoking about 29 years ago. Her smoking use included Cigarettes. She has a .25 pack-year smoking history. She does not have any smokeless tobacco history on file. She reports that she does not drink alcohol or use illicit drugs. The patient denies ETOH, tobacco, or drug use.   Review of Systems: Constitutional: Weight is stable.  Eyes: No changes in vision. ENT: No oral lesions, sore throat.  GI: see HPI.  Heme/Lymph: No easy bruising.  CV: No chest pain.  GU: No hematuria.  Integumentary: No rashes.  Neuro: No headaches.  Psych: No depression/anxiety.  Endocrine: No heat/cold intolerance.  Allergic/Immunologic: No urticaria.  Resp: No cough, SOB.  Musculoskeletal: No joint swelling.    Physical Examination: BP 133/92 mmHg  Pulse 74  Temp(Src) 98.3 F (36.8 C) (Oral)  Resp 19  Ht 5\' 6"  (1.676 m)  Wt 52.118 kg (114 lb 14.4 oz)  BMI 18.55 kg/m2  SpO2 100% Gen: mild to moderate distress, alert and oriented x 4, patient was in fetal position, rocking back  and forth for the majority of the interview, she states that makes her feel better. Very thin HEENT: PEERLA, EOMI, Neck: supple, no JVD or thyromegaly Chest: CTA bilaterally, no wheezes, crackles, or other adventitious sounds CV: RRR, no m/g/c/r Abd: soft, generalized tenderness, point tenderness in epigastric area, ND, hyperactive bowel sounds in all four quadrants; no HSM, guarding, ridigity, or rebound tenderness Ext: no edema, well perfused with 2+ pulses, Skin: no rash or lesions noted Lymph: no LAD  Data: Lab Results  Component Value Date   WBC 9.9 05/23/2015   HGB 11.2* 05/23/2015   HCT 34.3* 05/23/2015   MCV 89.5 05/23/2015   PLT 184 05/23/2015    Recent Labs Lab 05/23/15 0505  HGB 11.2*   Lab Results  Component Value Date   NA 139 05/23/2015   K 3.8 05/23/2015   CL 103 05/23/2015   CO2 27 05/23/2015   BUN 15 05/23/2015   CREATININE 1.02* 05/23/2015   Lab Results  Component Value Date   ALT 6* 05/23/2015   AST 23 05/23/2015   ALKPHOS 96 05/23/2015   BILITOT 0.6 05/23/2015   No results for input(s): APTT, INR, PTT in the last 168 hours.   Imaging: CLINICAL DATA: 59 year old female with epigastric abdominal pain associated with nausea and vomiting as well as substernal chest pain and mild shortness of breath. Medical history includes breast cancer, renal stones and prior hysterectomy.  EXAM: CTA ABDOMEN AND PELVIS wITHOUT AND WITH CONTRAST  TECHNIQUE: Multidetector CT imaging of the abdomen and pelvis was performed using the standard protocol during bolus administration of intravenous contrast. Multiplanar reconstructed images and MIPs were obtained and reviewed to evaluate the vascular anatomy.  CONTRAST: 168mL OMNIPAQUE IOHEXOL 350 MG/ML SOLN  COMPARISON: Prior CT scan abdomen and pelvis 11/25/2014; prior CT chest abdomen pelvis 05/18/2013  FINDINGS:  VASCULAR  Aorta: Normal caliber aorta. No aneurysm common dissection or other acute  abnormality. Mild scattered atherosclerotic plaque without evidence of stenosis.  Celiac: Widely patent and unremarkable. Conventional hepatic arterial anatomy. No visceral artery aneurysm.  SMA: Widely patent and unremarkable.  Renals: Single dominant renal arteries bilaterally are widely patent and unremarkable. No changes of fibromuscular dysplasia.  IMA: Widely patent and unremarkable.  Inflow: Widely patent and unremarkable.  Proximal Outflow: Visualized portions are widely patent and unremarkable. There is mild calcified atherosclerotic plaque along the posterior wall of the common femoral arteries without associated stenosis.  Veins: No focal venous abnormality identified. An IVC filter is well positioned below the renal veins.  NON-VASCULAR  Lower Chest: The lung bases are clear. Visualized cardiac structures are within normal limits for size. No pericardial effusion. Unremarkable visualized distal thoracic esophagus.  Abdomen: Surgical changes of prior gastric bypass procedure. Widely patent anastomoses. The excluded portion of the stomach is mildly full of fluid attenuation material but not overtly distended. Unremarkable CT appearance of the spleen, adrenal glands and pancreas. Normal hepatic contour and morphology. Subcentimeter low-attenuation lesions in hepatic segment 6 are too small for accurate characterization but statistically highly likely benign cysts. High attenuation material layering within the gallbladder lumen consistent with small stones in the fundus. No intra or extrahepatic biliary ductal dilatation.  Mild renal cortical thinning bilaterally. The no evidence of hydronephrosis or enhancing renal mass. Small renal cyst right lower pole. Colonic diverticular disease without CT evidence of active inflammation. No evidence of obstruction or focal bowel wall thickening. Normal appendix in the right lower quadrant. The terminal ileum is  unremarkable.  Pelvis: Surgical changes of prior hysterectomy. Unremarkable appearance of the bladder.  Bones/Soft Tissues: No acute fracture or aggressive appearing lytic or blastic osseous lesion. Incidental note is made of a lipoma within the anterior fascia of the right sartorius muscle.  Review of the MIP images confirms the above findings.  IMPRESSION: VASCULAR  1. No acute vascular abnormality. 2. Mild calcified atherosclerotic plaque without evidence of aneurysm, dissection or significant stenosis. 3. IVC filter in place. NON VASCULAR  1. No acute abnormality in the abdomen or pelvis to explain the patient's clinical symptoms. 2. Surgical changes of gastric bypass procedure without evidence of complication or obstruction. The gastric remanent is fluid filled but not distended. This is of doubtful clinical significance. 3. Colonic diverticular disease without CT evidence of active inflammation. 4. Mild bilateral renal cortical thinning. 5. Additional ancillary findings as above without significant interval change. Signed,  Criselda Peaches, MD  [facility]  [facility]   Electronically Signed  By: Jacqulynn Cadet M.D.  On: 05/23/2015 08:39 Assessment/Plan: Ms. Hage is a 59 y.o. female with abdominal pain, nausea and vomiting  Recommendations:  She reports she gets episodes like this every 3-4 months.   She had a hospitalization in August, 2014  wiith an ultrasound that showed  thickening of the wall of the gallbladder along with stones.  Surgical consult agreed that due to her comorbidities at the time she was not a good candidate for surgery.  Dr. Vira Agar performed at upper endoscopy at that time.  We recommend that she have another upper endoscopy on Monday, to determine if there are any ulcerations at the spot of anastomosis from her gastric bypass.  Due to her history of C diff  In January,  2013 that failed Flagyl, and her need to take 2 Imodium daily with up to 8 daily to  control her stools, we recommend C diff studies as well as stool cultures.   We agree with  Zofran and Protonix. Thank you for the consult. Please call with questions or concerns.  Salvadore Farber, PA-C  I personally performed these services.

## 2015-05-23 NOTE — Progress Notes (Signed)
Orthostatic VS taken. Noticed significant drop in blood pressure from lying, sitting and standing. HR remained stable lower than 100. Paged/Spoke with Dr. Verdell Carmine. Informed provider of above findings. Per MD d/c metoprolol for now.

## 2015-05-23 NOTE — ED Notes (Signed)
Sainani MD notified of Patient's B/P. Orders for Cardizem Received.

## 2015-05-23 NOTE — Care Management Note (Signed)
Case Management Note  Patient Details  Name: Lindsay [name] MRN: [id number] Date of Birth: Oct 13, 1956  Subjective/Objective:        Medicare Observation letter delivered to patient, verbalized understanding, signed,copy given ,original placed on chart.             Action/Plan:   Expected Discharge Date:                  Expected Discharge Plan:     In-House Referral:     Discharge planning Services     Post Acute Care Choice:    Choice offered to:     DME Arranged:    DME Agency:     HH Arranged:    [place] Agency:     Status of Service:     Medicare Important Message Given:    Date Medicare IM Given:    Medicare IM give by:    Date Additional Medicare IM Given:    Additional Medicare Important Message give by:     If discussed at [place] of Stay Meetings, dates discussed:    Additional Comments:  Ival Bible, RN 05/23/2015, 4:42 PM

## 2015-05-23 NOTE — ED Provider Notes (Addendum)
[facility] Emergency Department Provider Note  ____________________________________________  Time seen: Approximately 4:41 AM  I have reviewed the triage vital signs and the nursing notes.   HISTORY  Chief Complaint Chest and abdominal pain   HPI Lindsay [name] is a 59 y.o. female who presents to the ED from home with complaints of abdominal and chest pain. Patient complains of epigastric abdominal pain onset approximately noon. Describes aching, nonradiating pain associated with nausea and vomiting. Last bowel movement yesterday. Denies fever, chills, shortness of breath, dysuria. Complains of substernal chest pain associated with mild shortness of breath.   Past Medical History  Diagnosis Date  . HX: breast cancer     right  . Peripheral neuropathy   . Orthostatic hypotension   . Depression   . Vitamin D deficiency   . Anemia   . Depression   . CHF (congestive heart failure)   . Muscle spasm   . Chronic cough   . C. difficile diarrhea 10/2011  . Kidney stones 2013  Fibromyalgia Chronic pain syndrome  Patient Active Problem List   Diagnosis Date Noted  . C. difficile colitis 12/12/2011  . Chest pain 11/11/2011  . Syncope 11/11/2011  . Hypotension 11/11/2011  . Cardiomyopathy 11/11/2011  . AF (atrial fibrillation) 11/11/2011    Past Surgical History  Procedure Laterality Date  . Breast lumpectomy      right @ [facility]  . Lithotripsy  jan 2013   IVC filter Bariatric surgery  Current Outpatient Rx  Name  Route  Sig  Dispense  Refill  . acetaminophen (TYLENOL ARTHRITIS PAIN) 650 MG CR tablet   Oral   Take 650 mg by mouth every 8 (eight) hours as needed.           Marland Kitchen anastrozole (ARIMIDEX) 1 MG tablet   Oral   Take 1 mg by mouth daily.           Marland Kitchen aspirin 81 MG tablet   Oral   Take 81 mg by mouth daily.          Marland Kitchen azelastine (ASTELIN) 137 MCG/SPRAY nasal spray   Nasal   Place 1 spray into the nose 2 (two) times daily. Use in  each nostril as directed         . baclofen (LIORESAL) 10 MG tablet   Oral   Take 5 mg by mouth 3 (three) times daily.          . carvedilol (COREG) 3.125 MG tablet   Oral   Take 3.125 mg by mouth 2 (two) times daily with a meal.          . citalopram (CELEXA) 20 MG tablet   Oral   Take 20 mg by mouth daily.           . cyclobenzaprine (FLEXERIL) 10 MG tablet   Oral   Take 10 mg by mouth as needed.           . desloratadine (CLARINEX) 5 MG tablet   Oral   Take 5 mg by mouth daily.           . ferrous sulfate 325 (65 FE) MG tablet   Oral   Take 325 mg by mouth daily with breakfast.           . Flora-Q (FLORA-Q) CAPS   Oral   Take 1 capsule by mouth 2 (two) times daily.         . fluticasone (FLONASE) 50 MCG/ACT nasal spray  Nasal   Place 2 sprays into the nose daily.           Marland Kitchen gabapentin (NEURONTIN) 600 MG tablet   Oral   Take 600 mg by mouth 3 (three) times daily.           Marland Kitchen guaiFENesin (ROBITUSSIN) 100 MG/5ML liquid   Oral   Take 200 mg by mouth 3 (three) times daily as needed.           Marland Kitchen LOPERAMIDE HCL PO   Oral   Take by mouth.           . loratadine (CLARITIN) 10 MG tablet   Oral   Take 10 mg by mouth daily.           Marland Kitchen LORazepam (ATIVAN) 0.5 MG tablet   Oral   Take 0.5 mg by mouth every 8 (eight) hours.           . Meth-Hyo-M Bl-Na Phos-Ph Sal (URIBEL PO)   Oral   Take 1 capsule by mouth as needed.         . mirtazapine (REMERON) 15 MG tablet   Oral   Take 15 mg by mouth at bedtime.           Marland Kitchen OLANZapine (ZYPREXA) 2.5 MG tablet   Oral   Take 2.5 mg by mouth at bedtime.           Marland Kitchen omeprazole (PRILOSEC) 20 MG capsule   Oral   Take 20 mg by mouth daily.           Marland Kitchen oxyCODONE-acetaminophen (PERCOCET) 5-325 MG per tablet   Oral   Take 1 tablet by mouth every 6 (six) hours as needed.           . promethazine (PHENERGAN) 12.5 MG tablet   Oral   Take 12.5 mg by mouth every 6 (six) hours as needed.            Marland Kitchen SIMETHICONE-80 PO   Oral   Take 80 mg by mouth 3 (three) times daily as needed.          . vitamin B-12 (CYANOCOBALAMIN) 1000 MCG tablet   Oral   Take 1,000 mcg by mouth once a week.           . vitamin C (ASCORBIC ACID) 250 MG tablet   Oral   Take 250 mg by mouth daily.             Allergies Review of patient's allergies indicates no known allergies.  Family History  Problem Relation Age of Onset  . Heart attack Father     Social History History  Substance Use Topics  . Smoking status: Former Smoker -- 0.25 packs/day for 1 years    Types: Cigarettes    Quit date: 11/10/1985  . Smokeless tobacco: Not on file  . Alcohol Use: No    Review of Systems Constitutional: No fever/chills Eyes: No visual changes. ENT: No sore throat. Cardiovascular: Positive for chest pain. Respiratory: Denies shortness of breath. Gastrointestinal: Positive for abdominal pain.  Positive for nausea and vomiting.  No diarrhea.  No constipation. Genitourinary: Negative for dysuria. Musculoskeletal: Negative for back pain. Skin: Negative for rash. Neurological: Negative for headaches, focal weakness or numbness.  10-point ROS otherwise negative.  ____________________________________________   PHYSICAL EXAM:  VITAL SIGNS: ED Triage Vitals  Enc Vitals Group     BP 05/23/15 0403 121/93 mmHg     Pulse Rate 05/23/15 0403 138  Resp 05/23/15 0403 18     Temp 05/23/15 0403 97.7 F (36.5 C)     Temp Source 05/23/15 0403 Oral     SpO2 05/23/15 0403 100 %     Weight 05/23/15 0403 117 lb (53.071 kg)     Height 05/23/15 0403 5\' 6"  (1.676 m)     Head Cir --      Peak Flow --      Pain Score 05/23/15 0404 8     Pain Loc --      Pain Edu? --      Excl. in [place]? --     Constitutional: Alert and oriented. Ill-appearing and in moderate acute distress. Eyes: Conjunctivae are normal. PERRL. EOMI. Head: Atraumatic. Nose: No congestion/rhinnorhea. Mouth/Throat: Mucous  membranes are mildly dry.  Oropharynx non-erythematous. Neck: No stridor.   Cardiovascular: Irregularly irregular rhythm. Grossly normal heart sounds.  Good peripheral circulation. Respiratory: Normal respiratory effort.  No retractions. Lungs CTAB. Gastrointestinal: Soft, tender to palpation epigastrium without rebound or guarding. No distention. No abdominal bruits. No CVA tenderness. Musculoskeletal: No lower extremity tenderness nor edema.  No joint effusions. Neurologic:  Normal speech and language. No gross focal neurologic deficits are appreciated. Skin:  Skin is warm, and intact. No rash noted. Psychiatric: Mood and affect are normal. Speech and behavior are normal.  ____________________________________________   LABS (all labs ordered are listed, but only abnormal results are displayed)  Labs Reviewed  BASIC METABOLIC PANEL - Abnormal; Notable for the following:    Glucose, Bld 171 (*)    Creatinine, Ser 1.02 (*)    All other components within normal limits  CBC - Abnormal; Notable for the following:    Hemoglobin 11.2 (*)    HCT 34.3 (*)    All other components within normal limits  URINALYSIS COMPLETEWITH MICROSCOPIC ([facility] ONLY) - Abnormal; Notable for the following:    Color, Urine YELLOW (*)    APPearance CLEAR (*)    All other components within normal limits  HEPATIC FUNCTION PANEL - Abnormal; Notable for the following:    ALT 6 (*)    Bilirubin, Direct <0.1 (*)    All other components within normal limits  LIPASE, BLOOD - Abnormal; Notable for the following:    Lipase 12 (*)    All other components within normal limits  LACTIC ACID, PLASMA - Abnormal; Notable for the following:    Lactic Acid, Venous 2.6 (*)    All other components within normal limits  TROPONIN I  LACTIC ACID, PLASMA   ____________________________________________  EKG  ED ECG REPORT I, Benton Tooker J, the attending physician, personally viewed and interpreted this ECG.   Date: 05/23/2015   EKG Time: 0423  Rate: 165  Rhythm: atrial fibrillation, rate 165  Axis: Normal  Intervals:none  ST&T Change: Nonspecific  ____________________________________________  RADIOLOGY  Chest x-ray 2 views (viewed by me, interpreted per Dr. Dorann Lodge): No active cardiopulmonary disease. ____________________________________________   PROCEDURES  Procedure(s) performed: None  Critical Care performed: Yes, see critical care note(s)   CRITICAL CARE Performed by: Paulette Blanch   Total critical care time: 30 minutes  Critical care time was exclusive of separately billable procedures and treating other patients.  Critical care was necessary to treat or prevent imminent or life-threatening deterioration.  Critical care was time spent personally by me on the following activities: development of treatment plan with patient and/or surrogate as well as nursing, discussions with consultants, evaluation of patient's response to treatment, examination of patient, obtaining history  from patient or surrogate, ordering and performing treatments and interventions, ordering and review of laboratory studies, ordering and review of radiographic studies, pulse oximetry and re-evaluation of patient's condition.  ____________________________________________   INITIAL IMPRESSION / ASSESSMENT AND PLAN / ED COURSE  Pertinent labs & imaging results that were available during my care of the patient were reviewed by me and considered in my medical decision making (see chart for details).  59 year old female who presents in atrial fibrillation with rapid ventricular response, abdominal and chest pain. Radiology tech reports patient had a syncopal episode on standing for chest x-ray. IV fluid resuscitation, analgesia, screening laboratory work including LFTs and lipase; may need CT abdomen/pelvis.  ----------------------------------------- 5:57 AM on 05/23/2015 -----------------------------------------  Patient  is resting more comfortably. Heart rate down to 120s A. fib with rapid ventricular rate. Patient remains mildly tender diffusely, maximally in epigastrium. Will obtain CT abdomen/pelvis with IV contrast to evaluate for mesenteric ischemia.  ----------------------------------------- 7:04 AM on 05/23/2015 -----------------------------------------  Notified of patient's lactic acid of 2.6. Awaiting CT scan to evaluate for mesenteric ischemia. Care transferred to Dr. Corky Downs pending CT imaging results. Anticipate hospital admission. ____________________________________________   FINAL CLINICAL IMPRESSION(S) / ED DIAGNOSES  Final diagnoses:  Atrial fibrillation with rapid ventricular response  Epigastric pain  Chest pain, unspecified chest pain type  Pain      Paulette Blanch, MD 05/23/15 0705  ----------------------------------------- 7:13 AM on 05/23/2015 -----------------------------------------  I have discussed case with Dr. Bridgett Larsson who will evaluate patient in emergency department for hospital admission. CT abdomen/pelvis pending.  Paulette Blanch, MD 05/23/15 [phone]

## 2015-05-23 NOTE — ED Notes (Signed)
Report to Rejeana Brock, MD and 2A Charge RN aware of Patient's B/P and medications given. Patient A+O, Stable on transfer.

## 2015-05-24 DIAGNOSIS — R1013 Epigastric pain: Secondary | ICD-10-CM | POA: Diagnosis not present

## 2015-05-24 DIAGNOSIS — I4891 Unspecified atrial fibrillation: Secondary | ICD-10-CM

## 2015-05-24 LAB — CBC
HCT: 30.6 % — ABNORMAL LOW (ref 35.0–47.0)
HEMOGLOBIN: 10 g/dL — AB (ref 12.0–16.0)
MCH: 29.3 pg (ref 26.0–34.0)
MCHC: 32.6 g/dL (ref 32.0–36.0)
MCV: 89.8 fL (ref 80.0–100.0)
Platelets: 135 10*3/uL — ABNORMAL LOW (ref 150–440)
RBC: 3.41 MIL/uL — AB (ref 3.80–5.20)
RDW: 13.8 % (ref 11.5–14.5)
WBC: 7.3 10*3/uL (ref 3.6–11.0)

## 2015-05-24 LAB — COMPREHENSIVE METABOLIC PANEL
ALT: 6 U/L — ABNORMAL LOW (ref 14–54)
AST: 19 U/L (ref 15–41)
Albumin: 2.9 g/dL — ABNORMAL LOW (ref 3.5–5.0)
Alkaline Phosphatase: 73 U/L (ref 38–126)
Anion gap: 5 (ref 5–15)
BUN: 13 mg/dL (ref 6–20)
CHLORIDE: 111 mmol/L (ref 101–111)
CO2: 25 mmol/L (ref 22–32)
CREATININE: 0.96 mg/dL (ref 0.44–1.00)
Calcium: 8.8 mg/dL — ABNORMAL LOW (ref 8.9–10.3)
GFR calc Af Amer: >60 mL/min (ref >60)
GFR calc non Af Amer: >60 mL/min (ref >60)
GLUCOSE: 91 mg/dL (ref 65–99)
Potassium: 3.9 mmol/L (ref 3.5–5.1)
Sodium: 141 mmol/L (ref 135–145)
Total Bilirubin: 0.3 mg/dL (ref 0.3–1.2)
Total Protein: 5.7 g/dL — ABNORMAL LOW (ref 6.5–8.1)

## 2015-05-24 MED ORDER — BOOST / RESOURCE BREEZE PO LIQD: 1.0000 | Freq: Three times a day (TID) | ORAL | Status: DC

## 2015-05-24 NOTE — Consult Note (Signed)
GI Inpatient Follow-up Note  Patient Identification: Lindsay [name] is a 59 y.o. female. Is doing much better today.  She reports her pain level is still a 8/10, but this is her all over pain.  She denies any further nausea or vomiting, and still has not had a bowel movement.  She tolerated her clear liquids at breakfast and was advanced to soft diet at lunch.  She will have an upper endoscopy tomorrow with Dr. Vira Agar.  Subjective:  Scheduled Inpatient Medications:  . anastrozole  1 mg Oral Daily  . aspirin EC  81 mg Oral Daily  . azelastine  1 spray Each Nare BID  . cholecalciferol  1,000 Units Oral Daily  . enoxaparin (LOVENOX) injection  40 mg Subcutaneous Q24H  . feeding supplement  1 Container Oral TID BM  . ferrous sulfate  325 mg Oral Q breakfast  . fludrocortisone  0.2 mg Oral Daily  . fluticasone  1 spray Each Nare Daily  . midodrine  5 mg Oral TID WC  . mirtazapine  15 mg Oral QHS  . morphine  15 mg Oral 3 times per day  . pantoprazole  40 mg Oral Daily  . potassium chloride  20 mEq Oral BID  . pregabalin  150 mg Oral TID  . sertraline  50 mg Oral Daily  . spironolactone  25 mg Oral Daily  . tiZANidine  2 mg Oral TID  . vitamin B-12  1,000 mcg Oral Daily  . vitamin C  500 mg Oral Daily    Continuous Inpatient Infusions:   . sodium chloride 75 mL/hr at 05/23/15 2220  . sodium chloride      PRN Inpatient Medications:  acetaminophen **OR** acetaminophen, clonazePAM, diltiazem, loperamide, loratadine, ondansetron **OR** ondansetron (ZOFRAN) IV, oxyCODONE  Review of Systems: Constitutional: Weight is stable.  Eyes: No changes in vision. ENT: No oral lesions, sore throat.  GI: see HPI.  Heme/Lymph: No easy bruising.  CV: No chest pain.  GU: No hematuria.  Integumentary: No rashes.  Neuro: No headaches.  Psych: No depression/anxiety.  Endocrine: No heat/cold intolerance.  Allergic/Immunologic: No urticaria.  Resp: No cough, SOB.  Musculoskeletal: No joint  swelling.    Physical Examination: BP 120/75 mmHg  Pulse 52  Temp(Src) 98.7 F (37.1 C) (Oral)  Resp 17  Ht 5\' 6"  (1.676 m)  Wt 52.118 kg (114 lb 14.4 oz)  BMI 18.55 kg/m2  SpO2 100% Gen: NAD, alert and oriented x 4 HEENT: PEERLA, EOMI, Neck: supple, no JVD or thyromegaly Chest: CTA bilaterally, no wheezes, crackles, or other adventitious sounds CV: RRR, no m/g/c/r Abd: soft, NT, ND, +BS in all four quadrants; no HSM, guarding, ridigity, or rebound tenderness Ext: no edema, well perfused with 2+ pulses, Skin: no rash or lesions noted Lymph: no LAD  Data: Lab Results  Component Value Date   WBC 7.3 05/24/2015   HGB 10.0* 05/24/2015   HCT 30.6* 05/24/2015   MCV 89.8 05/24/2015   PLT 135* 05/24/2015    Recent Labs Lab 05/23/15 0505 05/24/15 0734  HGB 11.2* 10.0*   Lab Results  Component Value Date   NA 141 05/24/2015   K 3.9 05/24/2015   CL 111 05/24/2015   CO2 25 05/24/2015   BUN 13 05/24/2015   CREATININE 0.96 05/24/2015   Lab Results  Component Value Date   ALT 6* 05/24/2015   AST 19 05/24/2015   ALKPHOS 73 05/24/2015   BILITOT 0.3 05/24/2015   No results for input(s): APTT, INR, PTT  in the last 168 hours. Assessment/Plan: Ms. Groom is a 59 y.o. female with abdominal pain, nausea and vomiting  Recommendations: We agree with advancing her to a soft diet.  She is scheduled for upper EGD with Dr. Vira Agar tomorrow.  We will continue to follow with you. Please call with questions or concerns.  Salvadore Farber, PA-C  I personally performed these services.

## 2015-05-24 NOTE — Progress Notes (Signed)
Spoke with Dr. Tiffany Kocher regarding patient's orders for GI test. Per MD, test will be tomorrow. Place patient back on clear liquids for today, can add clear ensure if patient wants to try that.  Toniann Ket

## 2015-05-24 NOTE — Progress Notes (Signed)
Dr. Verdell Carmine notified of patient's bladder scan results - orders to in and out cath once and continue to monitor. Also notified that patient ate a good lunch, but still has not had a bowel movement - okay to discontinue c.diff orders. Lindsay [name]

## 2015-05-24 NOTE — Progress Notes (Signed)
Dr. Verdell Carmine in communication with Dr. Tiffany Kocher. Patient being advanced to soft diet. If patient does not have diarrhea after eating, discontinue c.diff collection/isolation precautions. Patient to be NPO after midnight for GI test tomorrow.  Toniann Ket

## 2015-05-24 NOTE — Progress Notes (Signed)
[facility] [place] NAME: Lindsay [name]    MR#:  [id number]  DATE OF BIRTH:  December 17, 1955  SUBJECTIVE:  CHIEF COMPLAINT:  Abdominal pain, chest pain. A. Fib w/ RVR.  Patient's abdominal pain now resolved. No nausea, vomiting. No diarrhea. Heart rate has improved.  REVIEW OF SYSTEMS:    Review of Systems  Constitutional: Negative for fever and chills.  HENT: Negative for congestion and tinnitus.   Eyes: Negative for blurred vision and double vision.  Respiratory: Negative for cough, shortness of breath and wheezing.   Cardiovascular: Negative for chest pain, orthopnea and PND.  Gastrointestinal: Negative for nausea, vomiting, abdominal pain and diarrhea.  Genitourinary: Negative for dysuria and hematuria.  Neurological: Negative for dizziness, sensory change and focal weakness.  All other systems reviewed and are negative.   Nutrition: Clear liquids Tolerating Diet: Yes Tolerating PT: Ambulatory  DRUG ALLERGIES:  No Known Allergies  VITALS:  Blood pressure 90/68, pulse 56, temperature 97.3 F (36.3 C), temperature source Oral, resp. rate 16, height 5\' 6"  (1.676 m), weight 52.118 kg (114 lb 14.4 oz), SpO2 100 %.  PHYSICAL EXAMINATION:   Physical Exam  GENERAL:  59 y.o.-year-old patient lying in the bed with no acute distress.  EYES: Pupils equal, round, reactive to light and accommodation. No scleral icterus. Extraocular muscles intact.  HEENT: Head atraumatic, normocephalic. Oropharynx and nasopharynx clear.  NECK:  Supple, no jugular venous distention. No thyroid enlargement, no tenderness.  LUNGS: Normal breath sounds bilaterally, no wheezing, rales, rhonchi. No use of accessory muscles of respiration.  CARDIOVASCULAR: S1, S2 RRR. No murmurs, rubs, or gallops.  ABDOMEN: Soft, nontender, nondistended. Bowel sounds present. No organomegaly or mass.  EXTREMITIES: No cyanosis, clubbing or edema b/l.    NEUROLOGIC: Cranial  nerves II through XII are intact. No focal Motor or sensory deficits b/l.   PSYCHIATRIC: The patient is alert and oriented x 3. Good affect SKIN: No obvious rash, lesion, or ulcer.    LABORATORY PANEL:   CBC  Recent Labs Lab 05/24/15 0734  WBC 7.3  HGB 10.0*  HCT 30.6*  PLT 135*   ------------------------------------------------------------------------------------------------------------------  Chemistries   Recent Labs Lab 05/24/15 0734  NA 141  K 3.9  CL 111  CO2 25  GLUCOSE 91  BUN 13  CREATININE 0.96  CALCIUM 8.8*  AST 19  ALT 6*  ALKPHOS 73  BILITOT 0.3   ------------------------------------------------------------------------------------------------------------------  Cardiac Enzymes  Recent Labs Lab 05/23/15 1314  TROPONINI <0.03   ------------------------------------------------------------------------------------------------------------------  RADIOLOGY:  Dg Chest 2 View  05/23/2015   CLINICAL DATA:  LEFT chest pain and shortness of breath tonight. Syncopal episode. History of breast cancer, CHF, ortho static hypotension.  EXAM: CHEST  2 VIEW  COMPARISON:  Chest radiograph January 12, 2015  FINDINGS: Cardiomediastinal silhouette is unremarkable. The lungs are clear without pleural effusions or focal consolidations. Trachea projects midline and there is no pneumothorax. Soft tissue planes and included osseous structures are non-suspicious. Surgical clips RIGHT axilla.  IMPRESSION: No active cardiopulmonary disease.   Electronically Signed   By: Elon Alas M.D.   On: 05/23/2015 05:02   Ct Angio Abd/pel W/ And/or W/o  05/23/2015   CLINICAL DATA:  59 year old female with epigastric abdominal pain associated with nausea and vomiting as well as substernal chest pain and mild shortness of breath. Medical history includes breast cancer, renal stones and prior hysterectomy.  EXAM: CTA ABDOMEN AND PELVIS wITHOUT AND WITH CONTRAST  TECHNIQUE: Multidetector CT  imaging of the abdomen and pelvis was performed using the standard protocol during bolus administration of intravenous contrast. Multiplanar reconstructed images and MIPs were obtained and reviewed to evaluate the vascular anatomy.  CONTRAST:  174mL OMNIPAQUE IOHEXOL 350 MG/ML SOLN  COMPARISON:  Prior CT scan abdomen and pelvis 11/25/2014; prior CT chest abdomen pelvis 05/18/2013  FINDINGS: VASCULAR  Aorta: Normal caliber aorta. No aneurysm common dissection or other acute abnormality. Mild scattered atherosclerotic plaque without evidence of stenosis.  Celiac: Widely patent and unremarkable. Conventional hepatic arterial anatomy. No visceral artery aneurysm.  SMA: Widely patent and unremarkable.  Renals: Single dominant renal arteries bilaterally are widely patent and unremarkable. No changes of fibromuscular dysplasia.  IMA: Widely patent and unremarkable.  Inflow: Widely patent and unremarkable.  Proximal Outflow: Visualized portions are widely patent and unremarkable. There is mild calcified atherosclerotic plaque along the posterior wall of the common femoral arteries without associated stenosis.  Veins: No focal venous abnormality identified. An IVC filter is well positioned below the renal veins.  NON-VASCULAR  Lower Chest: The lung bases are clear. Visualized cardiac structures are within normal limits for size. No pericardial effusion. Unremarkable visualized distal thoracic esophagus.  Abdomen: Surgical changes of prior gastric bypass procedure. Widely patent anastomoses. The excluded portion of the stomach is mildly full of fluid attenuation material but not overtly distended. Unremarkable CT appearance of the spleen, adrenal glands and pancreas. Normal hepatic contour and morphology. Subcentimeter low-attenuation lesions in hepatic segment 6 are too small for accurate characterization but statistically highly likely benign cysts. High attenuation material layering within the gallbladder lumen consistent  with small stones in the fundus. No intra or extrahepatic biliary ductal dilatation.  Mild renal cortical thinning bilaterally. The no evidence of hydronephrosis or enhancing renal mass. Small renal cyst right lower pole. Colonic diverticular disease without CT evidence of active inflammation. No evidence of obstruction or focal bowel wall thickening. Normal appendix in the right lower quadrant. The terminal ileum is unremarkable.  Pelvis: Surgical changes of prior hysterectomy. Unremarkable appearance of the bladder.  Bones/Soft Tissues: No acute fracture or aggressive appearing lytic or blastic osseous lesion. Incidental note is made of a lipoma within the anterior fascia of the right sartorius muscle.  Review of the MIP images confirms the above findings.  IMPRESSION: VASCULAR  1. No acute vascular abnormality. 2. Mild calcified atherosclerotic plaque without evidence of aneurysm, dissection or significant stenosis. 3. IVC filter in place. NON VASCULAR  1. No acute abnormality in the abdomen or pelvis to explain the patient's clinical symptoms. 2. Surgical changes of gastric bypass procedure without evidence of complication or obstruction. The gastric remanent is fluid filled but not distended. This is of doubtful clinical significance. 3. Colonic diverticular disease without CT evidence of active inflammation. 4. Mild bilateral renal cortical thinning. 5. Additional ancillary findings as above without significant interval change. Signed,  Criselda Peaches, MD  [facility]  [facility]   Electronically Signed   By: Jacqulynn Cadet M.D.   On: 05/23/2015 08:39     ASSESSMENT AND PLAN:   59 year old female with past medical history of breast cancer, chronic pain, peripheral neuropathy, hypertension, chronic afibrillation, history of orthostatic hypotension, history of CHF who presented to the hospital due to abdominal pain nausea vomiting and also vague chest  pain.   #1 abdominal pain-the exact etiology of this is unclear. Patient CT scan of the abdomen pelvis does not show any evidence of acute intra-abdominal pathology or evidence of  ischemic colitis. -Continue supportive care with IV fluids, pain control, antiemetics. -Pain has improved today. Seen by gastroenterology and plan to do upper GI endoscopy tomorrow. -Tolerating clear liquids and discussed with GI will advance to mechanical soft.  #2 chest pain-this is likely not cardiac in nature.  -Observed on telemetry and cardiac markers 3 a been negative. -Now resolved  #4 atrial fibrillation with rapid ventricular response-heart rate has improved and patient is converted to normal sinus rhythm now. -Was on metoprolol but has been discontinued due to orthostasis. Continue Cardizem as needed. -Await two-dimensional echo results.  #5 chronic pain-continue MS Contin, oxycodone as needed.  #6 history of orthostatic hypotension-continue Midodrine, Florinef.  #7 peripheral neuropathy-continue Lyrica.  #8 depression/anxiety-continue Zoloft, Klonopin.   Likely discharge home tomorrow if upper GI endoscopy negative.  All the records are reviewed and case discussed with Care Management/Social Workerr. Management plans discussed with the patient, family and they are in agreement.  CODE STATUS: Full  DVT Prophylaxis: Lovenox  TOTAL TIME TAKING CARE OF THIS PATIENT: 25 minutes.   POSSIBLE D/C IN 1-2 DAYS, DEPENDING ON CLINICAL CONDITION.   Henreitta Leber M.D on 05/24/2015 at 10:43 AM  Between 7am to 6pm - Pager - [phone]  After 6pm go to [email or web address] - password [email or web address] [facility]  Office  [phone]  CC: Primary care physician; Pcp Not In System

## 2015-05-25 ENCOUNTER — Observation Stay: Payer: Medicare Other | Admitting: Anesthesiology

## 2015-05-25 ENCOUNTER — Encounter: Payer: Self-pay | Admitting: *Deleted

## 2015-05-25 ENCOUNTER — Encounter
Admission: EM | Disposition: A | Discharge status: Home / Self Care | Payer: Self-pay | Source: Home / Self Care | Attending: Emergency Medicine

## 2015-05-25 DIAGNOSIS — R1013 Epigastric pain: Secondary | ICD-10-CM | POA: Diagnosis not present

## 2015-05-25 HISTORY — PX: ESOPHAGOGASTRODUODENOSCOPY (EGD) WITH PROPOFOL: SHX5813

## 2015-05-25 SURGERY — ESOPHAGOGASTRODUODENOSCOPY (EGD) WITH PROPOFOL
Anesthesia: General

## 2015-05-25 MED ORDER — SODIUM CHLORIDE 0.9 % IV BOLUS (SEPSIS)
1000.0000 mL | Freq: Once | INTRAVENOUS | Status: AC
  Administered 2015-05-25: 1000 mL via INTRAVENOUS

## 2015-05-25 MED ORDER — TAMSULOSIN HCL 0.4 MG PO CAPS: 0.4000 mg | ORAL_CAPSULE | Freq: Every day | ORAL | Status: DC

## 2015-05-25 MED ORDER — PROPOFOL 10 MG/ML IV BOLUS
INTRAVENOUS | Status: DC | PRN
  Administered 2015-05-25: 50 mg via INTRAVENOUS

## 2015-05-25 MED ORDER — PROPOFOL INFUSION 10 MG/ML OPTIME
INTRAVENOUS | Status: DC | PRN
  Administered 2015-05-25: 75 ug/kg/min via INTRAVENOUS

## 2015-05-25 MED ORDER — TAMSULOSIN HCL 0.4 MG PO CAPS
0.4000 mg | ORAL_CAPSULE | Freq: Every day | ORAL | Status: DC
  Administered 2015-05-25 – 2015-05-26 (×2): 0.4 mg via ORAL
  Filled 2015-05-25 (×2): qty 1

## 2015-05-25 MED ORDER — FENTANYL CITRATE (PF) 100 MCG/2ML IJ SOLN
INTRAMUSCULAR | Status: DC | PRN
  Administered 2015-05-25: 50 ug via INTRAVENOUS

## 2015-05-25 MED ORDER — SODIUM CHLORIDE 0.9 % IV SOLN
INTRAVENOUS | Status: DC
  Administered 2015-05-25: 14:00:00 via INTRAVENOUS

## 2015-05-25 NOTE — Anesthesia Preprocedure Evaluation (Signed)
Anesthesia Evaluation  Patient identified by MRN, date of birth, ID band Patient awake    Reviewed: Allergy & Precautions, H&P , NPO status , Patient's Chart, lab work & pertinent test results, reviewed documented beta blocker date and time   Airway Mallampati: II  TM Distance: >3 FB Neck ROM: full    Dental no notable dental hx. (+) Teeth Intact   Pulmonary former smoker,  breath sounds clear to auscultation  Pulmonary exam normal       Cardiovascular Exercise Tolerance: Poor +CHF Normal cardiovascular exam+ dysrhythmias Atrial Fibrillation Rhythm:regular Rate:Normal     Neuro/Psych PSYCHIATRIC DISORDERS  Neuromuscular disease    GI/Hepatic negative GI ROS, Neg liver ROS,   Endo/Other  negative endocrine ROS  Renal/GU Renal disease  negative genitourinary   Musculoskeletal negative musculoskeletal ROS (+)   Abdominal   Peds negative pediatric ROS (+)  Hematology negative hematology ROS (+)   Anesthesia Other Findings Past Medical History:   HX: breast cancer                                              Comment:right   Peripheral neuropathy                                        Orthostatic hypotension                                      Depression                                                   Vitamin D deficiency                                         Anemia                                                       Depression                                                   CHF (congestive heart failure)                               Muscle spasm                                                 Chronic cough  C. difficile diarrhea                           10/2011      Kidney stones                                   2013         Cancer                                                       Reproductive/Obstetrics negative OB ROS                              Anesthesia Physical Anesthesia Plan  ASA: III  Anesthesia Plan: General   Post-op Pain Management:    Induction:   Airway Management Planned:   Additional Equipment:   Intra-op Plan:   Post-operative Plan:   Informed Consent: I have reviewed the patients History and Physical, chart, labs and discussed the procedure including the risks, benefits and alternatives for the proposed anesthesia with the patient or authorized representative who has indicated his/her understanding and acceptance.     Plan Discussed with: Anesthesiologist, CRNA and Surgeon  Anesthesia Plan Comments:         Anesthesia Quick Evaluation

## 2015-05-25 NOTE — Discharge Summary (Addendum)
[facility] [place] NAME: Lindsay [name]    MR#:  [id number]  DATE OF BIRTH:  Jul 05, 1956  DATE OF ADMISSION:  05/23/2015 ADMITTING PHYSICIAN: Henreitta Leber, MD  DATE OF DISCHARGE: 05/25/2015  PRIMARY CARE PHYSICIAN: Pcp Not In System    ADMISSION DIAGNOSIS:  Epigastric pain [R10.13] Pain [R52] Atrial fibrillation with rapid ventricular response [I48.91] Chest pain, unspecified chest pain type [R07.9]  DISCHARGE DIAGNOSIS:  Principal Problem:   Abdominal pain Active Problems:   Atrial fibrillation   SECONDARY DIAGNOSIS:   Past Medical History  Diagnosis Date  . HX: breast cancer     right  . Peripheral neuropathy   . Orthostatic hypotension   . Depression   . Vitamin D deficiency   . Anemia   . Depression   . CHF (congestive heart failure)   . Muscle spasm   . Chronic cough   . C. difficile diarrhea 10/2011  . Kidney stones 2013  . Cancer     HOSPITAL COURSE:   59 year old female with past medical history of breast cancer, chronic pain, peripheral neuropathy, hypertension, chronic afibrillation, history of orthostatic hypotension, history of CHF who presented to the hospital due to abdominal pain nausea vomiting and also vague chest pain.   #1 abdominal pain-the exact etiology of this remains unclear. Patient CT scan of the abdomen pelvis on admission did not show any evidence of acute intra-abdominal pathology or evidence of ischemic colitis. -She was seen by gastroenterology and underwent upper GI endoscopy which showed no evidence of acute abnormality. -She did not have any diarrhea, worsening abdominal pain or nausea vomiting on the hospital. -Her diet has been advanced from clear liquid eventually to a soft diet which she is now tolerating without any evidence of worsening abdominal pain and therefore being discharged home.  #2 chest pain-this is likely not cardiac in nature.  -She was Observed on  telemetry and cardiac markers 3 a been negative. -Clinically asymptomatic now.  #4 atrial fibrillation with rapid ventricular response-initially when patient was admitted her heart rate was uncontrolled she was given pulse doses of IV Cardizem and her heart rate has not improved. -She is not on chronic rate controlling meds as she has a history of orthostatic hypotension - Echo done showing EF of 45-50% w/ no wall motion abnormalities.   #5 chronic pain-patient will continue MS Contin, oxycodone as needed.  #6 history of orthostatic hypotension-patient will continue Midodrine, Florinef.  #7 peripheral neuropathy-patient will continue Lyrica.  #8 depression/anxiety-continue Zoloft, Klonopin.  #9 Urinary Retention - patient developed this while in the hospital. A Foley was inserted. Patient was started on Flomax. Foley is now been removed and she is voiding well. -If this continues to be a problem she can follow up with urology as an outpatient.  DISCHARGE CONDITIONS:   Stable  CONSULTS OBTAINED:  Treatment Team:  Manya Silvas, MD  DRUG ALLERGIES:  No Known Allergies  DISCHARGE MEDICATIONS:   Current Discharge Medication List    START taking these medications   Details  tamsulosin (FLOMAX) 0.4 MG CAPS capsule Take 1 capsule (0.4 mg total) by mouth daily. Qty: 30 capsule, Refills: 1      CONTINUE these medications which have NOT CHANGED   Details  anastrozole (ARIMIDEX) 1 MG tablet Take 1 mg by mouth daily.      aspirin EC 81 MG tablet Take 81 mg by mouth daily.    Cholecalciferol 10000 UNITS CAPS Take  1,000 Units by mouth daily.    clonazePAM (KLONOPIN) 0.5 MG tablet Take 0.5 mg by mouth 2 (two) times daily as needed for anxiety.    ferrous sulfate 325 (65 FE) MG tablet Take 325 mg by mouth daily with breakfast.      fludrocortisone (FLORINEF) 0.1 MG tablet Take 0.2 mg by mouth daily.    fluticasone (FLONASE) 50 MCG/ACT nasal spray Place 1 spray into the nose  daily.     lidocaine (LIDODERM) 5 % Place 1 patch onto the skin daily. Remove & Discard patch within 12 hours or as directed by MD    loperamide (IMODIUM) 2 MG capsule Take 2 mg by mouth 4 (four) times daily as needed for diarrhea or loose stools.    loratadine (CLARITIN) 10 MG tablet Take 10 mg by mouth daily as needed for allergies.     midodrine (PROAMATINE) 5 MG tablet Take 5 mg by mouth 3 (three) times daily with meals.    mirtazapine (REMERON) 15 MG tablet Take 15 mg by mouth at bedtime.      morphine (MS CONTIN) 15 MG 12 hr tablet Take 15 mg by mouth every 8 (eight) hours.    omeprazole (PRILOSEC) 40 MG capsule Take 40 mg by mouth daily.    oxyCODONE (ROXICODONE) 15 MG immediate release tablet Take 15 mg by mouth every 6 (six) hours as needed for pain.    potassium chloride (K-DUR) 10 MEQ tablet Take 20 mEq by mouth 2 (two) times daily.    pregabalin (LYRICA) 150 MG capsule Take 150 mg by mouth 3 (three) times daily.    sertraline (ZOLOFT) 50 MG tablet Take 50 mg by mouth daily.    spironolactone (ALDACTONE) 25 MG tablet Take 25 mg by mouth daily.    tiZANidine (ZANAFLEX) 2 MG tablet Take 2 mg by mouth 3 (three) times daily.    vitamin B-12 (CYANOCOBALAMIN) 1000 MCG tablet Take 1,000 mcg by mouth daily.     vitamin C (ASCORBIC ACID) 500 MG tablet Take 500 mg by mouth daily.      STOP taking these medications     azelastine (ASTELIN) 137 MCG/SPRAY nasal spray          DISCHARGE INSTRUCTIONS:   DIET:  Cardiac diet  DISCHARGE CONDITION:  Stable  ACTIVITY:  Activity as tolerated  OXYGEN:  Home Oxygen: No.   Oxygen Delivery: room air  DISCHARGE LOCATION:  home   If you experience worsening of your admission symptoms, develop shortness of breath, life threatening emergency, suicidal or homicidal thoughts you must seek medical attention immediately by calling 911 or calling your MD immediately  if symptoms less severe.  You Must read complete  instructions/literature along with all the possible adverse reactions/side effects for all the Medicines you take and that have been prescribed to you. Take any new Medicines after you have completely understood and accpet all the possible adverse reactions/side effects.   Please note  You were cared for by a hospitalist during your hospital stay. If you have any questions about your discharge medications or the care you received while you were in the hospital after you are discharged, you can call the unit and asked to speak with the hospitalist on call if the hospitalist that took care of you is not available. Once you are discharged, your primary care physician will handle any further medical issues. Please note that NO REFILLS for any discharge medications will be authorized once you are discharged, as it is imperative  that you return to your primary care physician (or establish a relationship with a primary care physician if you do not have one) for your aftercare needs so that they can reassess your need for medications and monitor your lab values.   Today  Pt. Still has some abdominal pain but much improved since admission.  No N/V/Diarrhea.   Had Foley removed yesterday and now voiding well.  Complaining of a sore throat.   Will d/c home today.     Physical Exam  GENERAL: 59 y.o.-year-old patient lying in the bed with no acute distress.  EYES: Pupils equal, round, reactive to light and accommodation. No scleral icterus. Extraocular muscles intact.  HEENT: Head atraumatic, normocephalic. Oropharynx and nasopharynx clear. No Oral exudates or erythema.   NECK: Supple, no jugular venous distention. No thyroid enlargement, no tenderness.  LUNGS: Normal breath sounds bilaterally, no wheezing, rales, rhonchi. No use of accessory muscles of respiration.  CARDIOVASCULAR: S1, S2 RRR. No murmurs, rubs, or gallops.  ABDOMEN: Soft, nontender, nondistended. Bowel sounds present. No  organomegaly or mass.  EXTREMITIES: No cyanosis, clubbing or edema b/l.  NEUROLOGIC: Cranial nerves II through XII are intact. No focal Motor or sensory deficits b/l.  PSYCHIATRIC: The patient is alert and oriented x 3. Good affect SKIN: No obvious rash, lesion, or ulcer.        DATA REVIEW:   CBC  Recent Labs Lab 05/24/15 0734  WBC 7.3  HGB 10.0*  HCT 30.6*  PLT 135*    Chemistries   Recent Labs Lab 05/24/15 0734  NA 141  K 3.9  CL 111  CO2 25  GLUCOSE 91  BUN 13  CREATININE 0.96  CALCIUM 8.8*  AST 19  ALT 6*  ALKPHOS 73  BILITOT 0.3    Cardiac Enzymes  Recent Labs Lab 05/23/15 1314  TROPONINI <0.03    Microbiology Results  Results for orders placed or performed during the hospital encounter of 05/23/15  Stool culture     Status: None (Preliminary result)   Collection Time: 05/24/15  6:47 PM  Result Value Ref Range Status   Specimen Description STOOL  Final   Special Requests NONE  Final   Culture   Final    NO SALMONELLA OR SHIGELLA ISOLATED No Pathogenic E. coli detected NO CAMPYLOBACTER DETECTED    Report Status PENDING  Incomplete    RADIOLOGY:  No results found.    Management plans discussed with the patient, family and they are in agreement.  CODE STATUS:     Code Status Orders        Start     Ordered   05/23/15 1101  Full code   Continuous     05/23/15 1100      TOTAL TIME TAKING CARE OF THIS PATIENT: 40 minutes.    Henreitta Leber M.D on 05/25/2015 at 3:37 PM  Between 7am to 6pm - Pager - [phone]  After 6pm go to [email or web address] - password [email or web address] [facility]  Office  [phone]  CC: Primary care physician; Pcp Not In System

## 2015-05-25 NOTE — Transfer of Care (Signed)
Immediate Anesthesia Transfer of Care Note  Patient: Lindsay [name]  Procedure(s) Performed: Procedure(s): ESOPHAGOGASTRODUODENOSCOPY (EGD) WITH PROPOFOL (N/A)  Patient Location: PACU and Endoscopy Unit  Anesthesia Type:General  Level of Consciousness: awake  Airway & Oxygen Therapy: Patient Spontanous Breathing  Post-op Assessment: Report given to RN  Post vital signs: stable  Last Vitals:  Filed Vitals:   05/25/15 1357  BP: 141/85  Pulse: 61  Temp: 36.6 C  Resp: 16    Complications: No apparent anesthesia complications

## 2015-05-25 NOTE — Care Management (Signed)
Patient is followed by [facility] for [facility] services 7 days a week 4 hours a day.  She has medicaid transportation.  Informed CSW to arrange for medicaid transportation at discharge.  Patient does not have anyone to stay with her this night and is not a safe discharge home.   Patient will be met by her aide when discharged in the morning

## 2015-05-25 NOTE — Progress Notes (Signed)
[facility] NAME: Georgian Mcclory    MR#:  [id number]  DATE OF BIRTH:  12-27-1955  SUBJECTIVE:  CHIEF COMPLAINT:  Abdominal pain, chest pain. A. Fib w/ RVR.  Patient's abdominal pain now resolved. No nausea, vomiting. No diarrhea. HR stable. Going for endoscopy later today.  Tolerated soft diet well yesterday.  Had some urinary retention yesterday and s/p Foley cath now.    REVIEW OF SYSTEMS:    Review of Systems  Constitutional: Negative for fever and chills.  HENT: Negative for congestion and tinnitus.   Eyes: Negative for blurred vision and double vision.  Respiratory: Negative for cough, shortness of breath and wheezing.   Cardiovascular: Negative for chest pain, orthopnea and PND.  Gastrointestinal: Negative for nausea, vomiting, abdominal pain and diarrhea.  Genitourinary: Negative for dysuria and hematuria.       + Urinary Retention.    Neurological: Negative for dizziness, sensory change and focal weakness.  All other systems reviewed and are negative.   Nutrition: Mechanical soft.  Tolerating Diet: Yes Tolerating PT: Ambulatory  DRUG ALLERGIES:  No Known Allergies  VITALS:  Blood pressure 131/78, pulse 56, temperature 97.6 F (36.4 C), temperature source Oral, resp. rate 18, height 5\' 6"  (1.676 m), weight 52.118 kg (114 lb 14.4 oz), SpO2 100 %.  PHYSICAL EXAMINATION:   Physical Exam  GENERAL:  59 y.o.-year-old patient lying in the bed with no acute distress.  EYES: Pupils equal, round, reactive to light and accommodation. No scleral icterus. Extraocular muscles intact.  HEENT: Head atraumatic, normocephalic. Oropharynx and nasopharynx clear.  NECK:  Supple, no jugular venous distention. No thyroid enlargement, no tenderness.  LUNGS: Normal breath sounds bilaterally, no wheezing, rales, rhonchi. No use of accessory muscles of respiration.  CARDIOVASCULAR: S1, S2 RRR. No murmurs, rubs, or gallops.   ABDOMEN: Soft, nontender, nondistended. Bowel sounds present. No organomegaly or mass.  EXTREMITIES: No cyanosis, clubbing or edema b/l.    NEUROLOGIC: Cranial nerves II through XII are intact. No focal Motor or sensory deficits b/l.   PSYCHIATRIC: The patient is alert and oriented x 3. Good affect SKIN: No obvious rash, lesion, or ulcer.    LABORATORY PANEL:   CBC  Recent Labs Lab 05/24/15 0734  WBC 7.3  HGB 10.0*  HCT 30.6*  PLT 135*   ------------------------------------------------------------------------------------------------------------------  Chemistries   Recent Labs Lab 05/24/15 0734  NA 141  K 3.9  CL 111  CO2 25  GLUCOSE 91  BUN 13  CREATININE 0.96  CALCIUM 8.8*  AST 19  ALT 6*  ALKPHOS 73  BILITOT 0.3   ------------------------------------------------------------------------------------------------------------------  Cardiac Enzymes  Recent Labs Lab 05/23/15 1314  TROPONINI <0.03   ------------------------------------------------------------------------------------------------------------------  RADIOLOGY:  No results found.   ASSESSMENT AND PLAN:   59 year old female with past medical history of breast cancer, chronic pain, peripheral neuropathy, hypertension, chronic afibrillation, history of orthostatic hypotension, history of CHF who presented to the hospital due to abdominal pain nausea vomiting and also vague chest pain.   #1 abdominal pain-the exact etiology of this is unclear. Patient CT scan of the abdomen pelvis does not show any evidence of acute intra-abdominal pathology or evidence of ischemic colitis. -Continue supportive care with IV fluids, pain control, antiemetics. -Pain has resolved now. Seen by gastroenterology and plan to do upper GI endoscopy today. -Tolerating mechanical soft diet.  If Endoscopy (-) then d/c home later today.  #2 chest pain-this is likely not cardiac in nature.  -  Observed on telemetry and cardiac  markers 3 a been negative. -Now resolved  #4 atrial fibrillation with rapid ventricular response-heart rate has improved and patient is converted to normal sinus rhythm now. -Was on metoprolol but has been discontinued due to orthostasis. Continue Cardizem as needed. - Echo done showing EF of 45-50% w/ no wall motion abnormalities.    #5 chronic pain-continue MS Contin, oxycodone as needed.  #6 history of orthostatic hypotension-continue Midodrine, Florinef.  #7 peripheral neuropathy-continue Lyrica.  #8 depression/anxiety-continue Zoloft, Klonopin.  #9 Urinary Retention - s/p In and out cath but then developed urinary retention again.  S/p Foley now - start on Flomax.  Will attempt to remove foley post endoscopy    Likely discharge home later today if Endoscopy (-) & able to void.    All the records are reviewed and case discussed with Care Management/Social Workerr. Management plans discussed with the patient, family and they are in agreement.  CODE STATUS: Full  DVT Prophylaxis: Lovenox  TOTAL TIME TAKING CARE OF THIS PATIENT: 25 minutes.   POSSIBLE D/C later today, DEPENDING ON CLINICAL CONDITION.   Henreitta Leber M.D on 05/25/2015 at 10:49 AM  Between 7am to 6pm - Pager - [phone]  After 6pm go to [email or web address] - password [email or web address] [facility]  Office  [phone]  CC: Primary care physician; Pcp Not In System

## 2015-05-25 NOTE — Progress Notes (Signed)
Pt's BP was low at evening vital check.  MD, Dr. Lavetta Nielsen notified.  MD ordered 1 liter bolus.  Continue to monitor.   Jessee Avers

## 2015-05-25 NOTE — Op Note (Signed)
[facility] Patient Name: Lindsay [name] Procedure Date: 05/25/2015 2:24 PM MRN: [id number] Account #: [id number] Date of Birth: 01/10/1956 Admit Type: Inpatient Age: 59 Room: [facility] ENDO ROOM 1 Gender: Female Note Status: Finalized Procedure:         Upper GI endoscopy Indications:       Epigastric abdominal pain, Generalized abdominal pain Providers:         Manya Silvas, MD Referring MD:      No Local Md, MD (Referring MD) Medicines:         Propofol per Anesthesia Complications:     No immediate complications. Procedure:         Pre-Anesthesia Assessment:                    - After reviewing the risks and benefits, the patient was                     deemed in satisfactory condition to undergo the procedure.                    After obtaining informed consent, the endoscope was passed                     under direct vision. Throughout the procedure, the                     patient's blood pressure, pulse, and oxygen saturations                     were monitored continuously. The Olympus GIF-160 endoscope                     (S#. [id number]) was introduced through the mouth, and                     advanced to the jejunum. The upper GI endoscopy was                     accomplished without difficulty. The patient tolerated the                     procedure well. Findings:      The examined esophagus was normal. GEJ 40cm. There was noted a very       small stomach remnant and then a normal jejunal loop. No blood no ulcer,       no gastritis no small bowel inflammation. Impression:        - Normal esophagus.                    - No specimens collected. Recommendation:    - The findings and recommendations were discussed with the                     patient. Manya Silvas, MD 05/25/2015 3:06:01 PM This report has been signed electronically. Number of Addenda: 0 Note Initiated On: 05/25/2015 2:24 PM      [facility]

## 2015-05-26 DIAGNOSIS — R1013 Epigastric pain: Secondary | ICD-10-CM | POA: Diagnosis not present

## 2015-05-26 MED ORDER — CEPASTAT 14.5 MG MT LOZG: 1.0000 | LOZENGE | OROMUCOSAL | Status: DC | PRN

## 2015-05-26 NOTE — Anesthesia Postprocedure Evaluation (Signed)
  Anesthesia Post-op Note  Patient: Lindsay [name]  Procedure(s) Performed: Procedure(s): ESOPHAGOGASTRODUODENOSCOPY (EGD) WITH PROPOFOL (N/A)  Anesthesia type:General  Patient location: PACU  Post pain: Pain level controlled  Post assessment: Post-op Vital signs reviewed, Patient's Cardiovascular Status Stable, Respiratory Function Stable, Patent Airway and No signs of Nausea or vomiting  Post vital signs: Reviewed and stable  Last Vitals:  Filed Vitals:   05/26/15 0533  BP: 117/79  Pulse: 55  Temp: 36.4 C  Resp: 19    Level of consciousness: awake, alert  and patient cooperative  Complications: No apparent anesthesia complications

## 2015-05-26 NOTE — Progress Notes (Signed)
Pt. Discharged to home via ride from Medicare. Discharge instructions and medication regimen reviewed at bedside with patient. Pt. verbalizes understanding of instructions and medication regimen. Prescriptions included with patient papers, pt is aware she will have to get it filled at a pharmacy. Patient assessment unchanged from this morning. TELE and IV discontinued per policy.

## 2015-05-26 NOTE — Discharge Instructions (Signed)
Abdominal Pain, Women Abdominal (stomach, pelvic, or belly) pain can be caused by many things. It is important to tell your doctor:  The location of the pain.  Does it come and go or is it present all the time?  Are there things that start the pain (eating certain foods, exercise)?  Are there other symptoms associated with the pain (fever, nausea, vomiting, diarrhea)? All of this is helpful to know when trying to find the cause of the pain. CAUSES   Stomach: virus or bacteria infection, or ulcer.  Intestine: appendicitis (inflamed appendix), regional ileitis (Crohn's disease), ulcerative colitis (inflamed colon), irritable bowel syndrome, diverticulitis (inflamed diverticulum of the colon), or cancer of the stomach or intestine.  Gallbladder disease or stones in the gallbladder.  Kidney disease, kidney stones, or infection.  Pancreas infection or cancer.  Fibromyalgia (pain disorder).  Diseases of the female organs:  Uterus: fibroid (non-cancerous) tumors or infection.  Fallopian tubes: infection or tubal pregnancy.  Ovary: cysts or tumors.  Pelvic adhesions (scar tissue).  Endometriosis (uterus lining tissue growing in the pelvis and on the pelvic organs).  Pelvic congestion syndrome (female organs filling up with blood just before the menstrual period).  Pain with the menstrual period.  Pain with ovulation (producing an egg).  Pain with an IUD (intrauterine device, birth control) in the uterus.  Cancer of the female organs.  Functional pain (pain not caused by a disease, may improve without treatment).  Psychological pain.  Depression. DIAGNOSIS  Your doctor will decide the seriousness of your pain by doing an examination.  Blood tests.  X-rays.  Ultrasound.  CT scan (computed tomography, special type of X-ray).  MRI (magnetic resonance imaging).  Cultures, for infection.  Barium enema (dye inserted in the large intestine, to better view it with  X-rays).  Colonoscopy (looking in intestine with a lighted tube).  Laparoscopy (minor surgery, looking in abdomen with a lighted tube).  Major abdominal exploratory surgery (looking in abdomen with a large incision). TREATMENT  The treatment will depend on the cause of the pain.   Many cases can be observed and treated at home.  Over-the-counter medicines recommended by your caregiver.  Prescription medicine.  Antibiotics, for infection.  Birth control pills, for painful periods or for ovulation pain.  Hormone treatment, for endometriosis.  Nerve blocking injections.  Physical therapy.  Antidepressants.  Counseling with a psychologist or psychiatrist.  Minor or major surgery. HOME CARE INSTRUCTIONS   Do not take laxatives, unless directed by your caregiver.  Take over-the-counter pain medicine only if ordered by your caregiver. Do not take aspirin because it can cause an upset stomach or bleeding.  Try a clear liquid diet (broth or water) as ordered by your caregiver. Slowly move to a bland diet, as tolerated, if the pain is related to the stomach or intestine.  Have a thermometer and take your temperature several times a day, and record it.  Bed rest and sleep, if it helps the pain.  Avoid sexual intercourse, if it causes pain.  Avoid stressful situations.  Keep your follow-up appointments and tests, as your caregiver orders.  If the pain does not go away with medicine or surgery, you may try:  Acupuncture.  Relaxation exercises (yoga, meditation).  Group therapy.  Counseling. SEEK MEDICAL CARE IF:   You notice certain foods cause stomach pain.  Your home care treatment is not helping your pain.  You need stronger pain medicine.  You want your IUD removed.  You feel faint or  lightheaded.  You develop nausea and vomiting.  You develop a rash.  You are having side effects or an allergy to your medicine. SEEK IMMEDIATE MEDICAL CARE IF:   Your  pain does not go away or gets worse.  You have a fever.  Your pain is felt only in portions of the abdomen. The right side could possibly be appendicitis. The left lower portion of the abdomen could be colitis or diverticulitis.  You are passing blood in your stools (bright red or black tarry stools, with or without vomiting).  You have blood in your urine.  You develop chills, with or without a fever.  You pass out. MAKE SURE YOU:   Understand these instructions.  Will watch your condition.  Will get help right away if you are not doing well or get worse. Document Released: 08/21/2007 Document Revised: 03/10/2014 Document Reviewed: 09/10/2009 [facility] Patient Information 2015 [place], [place]. This information is not intended to replace advice given to you by your health care provider. Make sure you discuss any questions you have with your health care provider.  Abdominal Pain Many things can cause belly (abdominal) pain. Most times, the belly pain is not dangerous. Many cases of belly pain can be watched and treated at home. HOME CARE   Do not take medicines that help you go poop (laxatives) unless told to by your doctor.  Only take medicine as told by your doctor.  Eat or drink as told by your doctor. Your doctor will tell you if you should be on a special diet. GET HELP IF:  You do not know what is causing your belly pain.  You have belly pain while you are sick to your stomach (nauseous) or have runny poop (diarrhea).  You have pain while you pee or poop.  Your belly pain wakes you up at night.  You have belly pain that gets worse or better when you eat.  You have belly pain that gets worse when you eat fatty foods.  You have a fever. GET HELP RIGHT AWAY IF:   The pain does not go away within 2 hours.  You keep throwing up (vomiting).  The pain changes and is only in the right or left part of the belly.  You have bloody or tarry looking poop. MAKE SURE YOU:    Understand these instructions.  Will watch your condition.  Will get help right away if you are not doing well or get worse. Document Released: 04/11/2008 Document Revised: 10/29/2013 Document Reviewed: 07/03/2013 [facility] Patient Information 2015 [place], [place]. This information is not intended to replace advice given to you by your health care provider. Make sure you discuss any questions you have with your health care provider.

## 2015-05-26 NOTE — Progress Notes (Signed)
At morning vital sign check pt says she has been hurting all night.  Scheduled MS contin was given earlier in shift but pt never called for further medication.  Pt has had several bowel movements during shift.  Pt was given scheduled MS contin again this morning and was told to let staff know if she needs any other medicaiton.  Klonopin and Immodium were given during shift as well.  Will continue to monitor. Jessee Avers

## 2015-05-28 LAB — O&P RESULT

## 2015-05-29 LAB — STOOL CULTURE

## 2015-06-01 ENCOUNTER — Encounter: Payer: Self-pay | Admitting: Unknown Physician Specialty

## 2015-06-26 LAB — OVA + PARASITE EXAM

## 2015-08-23 ENCOUNTER — Other Ambulatory Visit: Payer: Self-pay

## 2015-08-23 ENCOUNTER — Encounter: Payer: Self-pay | Admitting: Emergency Medicine

## 2015-08-23 ENCOUNTER — Inpatient Hospital Stay
Admission: EM | Admit: 2015-08-23 | Discharge: 2015-08-27 | DRG: 312 | Disposition: A | Discharge status: Home / Self Care | Payer: Medicare Other | Attending: Internal Medicine | Admitting: Internal Medicine

## 2015-08-23 ENCOUNTER — Emergency Department: Payer: Medicare Other

## 2015-08-23 DIAGNOSIS — Z9884 Bariatric surgery status: Secondary | ICD-10-CM

## 2015-08-23 DIAGNOSIS — E559 Vitamin D deficiency, unspecified: Secondary | ICD-10-CM | POA: Diagnosis present

## 2015-08-23 DIAGNOSIS — R29898 Other symptoms and signs involving the musculoskeletal system: Secondary | ICD-10-CM | POA: Insufficient documentation

## 2015-08-23 DIAGNOSIS — I951 Orthostatic hypotension: Principal | ICD-10-CM | POA: Insufficient documentation

## 2015-08-23 DIAGNOSIS — Z818 Family history of other mental and behavioral disorders: Secondary | ICD-10-CM

## 2015-08-23 DIAGNOSIS — F3341 Major depressive disorder, recurrent, in partial remission: Secondary | ICD-10-CM | POA: Insufficient documentation

## 2015-08-23 DIAGNOSIS — Z87891 Personal history of nicotine dependence: Secondary | ICD-10-CM

## 2015-08-23 DIAGNOSIS — Z79899 Other long term (current) drug therapy: Secondary | ICD-10-CM

## 2015-08-23 DIAGNOSIS — C50911 Malignant neoplasm of unspecified site of right female breast: Secondary | ICD-10-CM | POA: Diagnosis present

## 2015-08-23 DIAGNOSIS — Z9221 Personal history of antineoplastic chemotherapy: Secondary | ICD-10-CM

## 2015-08-23 DIAGNOSIS — Z87442 Personal history of urinary calculi: Secondary | ICD-10-CM

## 2015-08-23 DIAGNOSIS — R296 Repeated falls: Secondary | ICD-10-CM | POA: Diagnosis present

## 2015-08-23 DIAGNOSIS — I5022 Chronic systolic (congestive) heart failure: Secondary | ICD-10-CM | POA: Diagnosis present

## 2015-08-23 DIAGNOSIS — Z8249 Family history of ischemic heart disease and other diseases of the circulatory system: Secondary | ICD-10-CM

## 2015-08-23 DIAGNOSIS — D649 Anemia, unspecified: Secondary | ICD-10-CM | POA: Insufficient documentation

## 2015-08-23 DIAGNOSIS — Z7951 Long term (current) use of inhaled steroids: Secondary | ICD-10-CM

## 2015-08-23 DIAGNOSIS — T451X5A Adverse effect of antineoplastic and immunosuppressive drugs, initial encounter: Secondary | ICD-10-CM | POA: Diagnosis present

## 2015-08-23 DIAGNOSIS — N390 Urinary tract infection, site not specified: Secondary | ICD-10-CM | POA: Diagnosis present

## 2015-08-23 DIAGNOSIS — R55 Syncope and collapse: Secondary | ICD-10-CM | POA: Diagnosis present

## 2015-08-23 DIAGNOSIS — F419 Anxiety disorder, unspecified: Secondary | ICD-10-CM | POA: Diagnosis present

## 2015-08-23 DIAGNOSIS — K529 Noninfective gastroenteritis and colitis, unspecified: Secondary | ICD-10-CM | POA: Diagnosis present

## 2015-08-23 DIAGNOSIS — G620 Drug-induced polyneuropathy: Secondary | ICD-10-CM | POA: Diagnosis present

## 2015-08-23 DIAGNOSIS — E46 Unspecified protein-calorie malnutrition: Secondary | ICD-10-CM | POA: Diagnosis present

## 2015-08-23 DIAGNOSIS — Z7982 Long term (current) use of aspirin: Secondary | ICD-10-CM

## 2015-08-23 DIAGNOSIS — G8929 Other chronic pain: Secondary | ICD-10-CM | POA: Diagnosis present

## 2015-08-23 DIAGNOSIS — R079 Chest pain, unspecified: Secondary | ICD-10-CM | POA: Insufficient documentation

## 2015-08-23 DIAGNOSIS — Z853 Personal history of malignant neoplasm of breast: Secondary | ICD-10-CM

## 2015-08-23 DIAGNOSIS — I480 Paroxysmal atrial fibrillation: Secondary | ICD-10-CM | POA: Insufficient documentation

## 2015-08-23 DIAGNOSIS — I639 Cerebral infarction, unspecified: Secondary | ICD-10-CM

## 2015-08-23 DIAGNOSIS — F329 Major depressive disorder, single episode, unspecified: Secondary | ICD-10-CM

## 2015-08-23 DIAGNOSIS — Z681 Body mass index (BMI) 19 or less, adult: Secondary | ICD-10-CM

## 2015-08-23 DIAGNOSIS — I429 Cardiomyopathy, unspecified: Secondary | ICD-10-CM | POA: Diagnosis present

## 2015-08-23 HISTORY — DX: Paroxysmal atrial fibrillation: I48.0

## 2015-08-23 HISTORY — DX: Chronic systolic (congestive) heart failure: I50.22

## 2015-08-23 LAB — BASIC METABOLIC PANEL
Anion gap: 3 — ABNORMAL LOW (ref 5–15)
BUN: 16 mg/dL (ref 6–20)
CALCIUM: 8.9 mg/dL (ref 8.9–10.3)
CHLORIDE: 113 mmol/L — AB (ref 101–111)
CO2: 24 mmol/L (ref 22–32)
Creatinine, Ser: 1.01 mg/dL — ABNORMAL HIGH (ref 0.44–1.00)
GFR calc Af Amer: >60 mL/min (ref >60)
GFR calc non Af Amer: >60 mL/min (ref >60)
Glucose, Bld: 85 mg/dL (ref 65–99)
Potassium: 4 mmol/L (ref 3.5–5.1)
SODIUM: 140 mmol/L (ref 135–145)

## 2015-08-23 LAB — CBC
HCT: 31.5 % — ABNORMAL LOW (ref 35.0–47.0)
Hemoglobin: 10.2 g/dL — ABNORMAL LOW (ref 12.0–16.0)
MCH: 28.2 pg (ref 26.0–34.0)
MCHC: 32.4 g/dL (ref 32.0–36.0)
MCV: 86.9 fL (ref 80.0–100.0)
PLATELETS: 172 10*3/uL (ref 150–440)
RBC: 3.62 MIL/uL — ABNORMAL LOW (ref 3.80–5.20)
RDW: 16.4 % — AB (ref 11.5–14.5)
WBC: 8.4 10*3/uL (ref 3.6–11.0)

## 2015-08-23 LAB — TROPONIN I

## 2015-08-23 MED ORDER — ENOXAPARIN SODIUM 40 MG/0.4ML ~~LOC~~ SOLN
40.0000 mg | SUBCUTANEOUS | Status: DC
  Administered 2015-08-23 – 2015-08-26 (×4): 40 mg via SUBCUTANEOUS
  Filled 2015-08-23 (×4): qty 0.4

## 2015-08-23 MED ORDER — ANASTROZOLE 1 MG PO TABS
1.0000 mg | ORAL_TABLET | Freq: Every day | ORAL | Status: DC
  Administered 2015-08-24 – 2015-08-27 (×4): 1 mg via ORAL
  Filled 2015-08-23 (×4): qty 1

## 2015-08-23 MED ORDER — ACETAMINOPHEN 650 MG RE SUPP: 650.0000 mg | Freq: Four times a day (QID) | RECTAL | Status: DC | PRN

## 2015-08-23 MED ORDER — CLONAZEPAM 0.5 MG PO TABS
0.5000 mg | ORAL_TABLET | Freq: Two times a day (BID) | ORAL | Status: DC | PRN
  Administered 2015-08-23 – 2015-08-26 (×4): 0.5 mg via ORAL
  Filled 2015-08-23 (×4): qty 1

## 2015-08-23 MED ORDER — ONDANSETRON HCL 4 MG PO TABS: 4.0000 mg | ORAL_TABLET | Freq: Four times a day (QID) | ORAL | Status: DC | PRN

## 2015-08-23 MED ORDER — VITAMIN C 500 MG PO TABS: 500.0000 mg | ORAL_TABLET | Freq: Every day | ORAL | Status: DC

## 2015-08-23 MED ORDER — TIZANIDINE HCL 4 MG PO TABS
2.0000 mg | ORAL_TABLET | Freq: Three times a day (TID) | ORAL | Status: DC
  Administered 2015-08-23 – 2015-08-27 (×13): 2 mg via ORAL
  Filled 2015-08-23 (×13): qty 1

## 2015-08-23 MED ORDER — PREGABALIN 75 MG PO CAPS
150.0000 mg | ORAL_CAPSULE | Freq: Three times a day (TID) | ORAL | Status: DC
  Administered 2015-08-23 – 2015-08-27 (×13): 150 mg via ORAL
  Filled 2015-08-23 (×13): qty 2

## 2015-08-23 MED ORDER — OXYCODONE HCL 5 MG PO TABS
5.0000 mg | ORAL_TABLET | Freq: Once | ORAL | Status: AC
  Administered 2015-08-23: 5 mg via ORAL
  Filled 2015-08-23: qty 1

## 2015-08-23 MED ORDER — VITAMIN D 1000 UNITS PO TABS
1000.0000 [IU] | ORAL_TABLET | Freq: Every day | ORAL | Status: DC
  Administered 2015-08-24 – 2015-08-27 (×4): 1000 [IU] via ORAL
  Filled 2015-08-23 (×4): qty 1

## 2015-08-23 MED ORDER — SPIRONOLACTONE 25 MG PO TABS: 25.0000 mg | ORAL_TABLET | Freq: Every day | ORAL | Status: DC

## 2015-08-23 MED ORDER — TAMSULOSIN HCL 0.4 MG PO CAPS
0.4000 mg | ORAL_CAPSULE | Freq: Every day | ORAL | Status: DC
  Administered 2015-08-24 – 2015-08-27 (×4): 0.4 mg via ORAL
  Filled 2015-08-23 (×4): qty 1

## 2015-08-23 MED ORDER — MORPHINE SULFATE ER 15 MG PO TBCR
15.0000 mg | EXTENDED_RELEASE_TABLET | Freq: Three times a day (TID) | ORAL | Status: DC
  Administered 2015-08-23 – 2015-08-24 (×4): 15 mg via ORAL
  Filled 2015-08-23 (×3): qty 1

## 2015-08-23 MED ORDER — ASPIRIN EC 81 MG PO TBEC: 81.0000 mg | DELAYED_RELEASE_TABLET | Freq: Every day | ORAL | Status: DC

## 2015-08-23 MED ORDER — POTASSIUM CHLORIDE ER 10 MEQ PO TBCR
20.0000 meq | EXTENDED_RELEASE_TABLET | Freq: Two times a day (BID) | ORAL | Status: DC
  Administered 2015-08-23 – 2015-08-27 (×8): 20 meq via ORAL
  Filled 2015-08-23 (×18): qty 2

## 2015-08-23 MED ORDER — FLUDROCORTISONE ACETATE 0.1 MG PO TABS
0.2000 mg | ORAL_TABLET | Freq: Every day | ORAL | Status: DC
  Administered 2015-08-24 – 2015-08-27 (×4): 0.2 mg via ORAL
  Filled 2015-08-23 (×5): qty 2

## 2015-08-23 MED ORDER — SERTRALINE HCL 50 MG PO TABS: 50.0000 mg | ORAL_TABLET | Freq: Every day | ORAL | Status: DC

## 2015-08-23 MED ORDER — MIRTAZAPINE 15 MG PO TABS
15.0000 mg | ORAL_TABLET | Freq: Every day | ORAL | Status: DC
  Administered 2015-08-23 – 2015-08-26 (×4): 15 mg via ORAL
  Filled 2015-08-23 (×4): qty 1

## 2015-08-23 MED ORDER — LOPERAMIDE HCL 2 MG PO CAPS
2.0000 mg | ORAL_CAPSULE | Freq: Four times a day (QID) | ORAL | Status: DC | PRN
  Administered 2015-08-23 – 2015-08-25 (×2): 2 mg via ORAL
  Filled 2015-08-23 (×2): qty 1

## 2015-08-23 MED ORDER — SERTRALINE HCL 50 MG PO TABS
50.0000 mg | ORAL_TABLET | Freq: Every day | ORAL | Status: DC
  Administered 2015-08-24 – 2015-08-27 (×4): 50 mg via ORAL
  Filled 2015-08-23 (×5): qty 1

## 2015-08-23 MED ORDER — ASPIRIN EC 81 MG PO TBEC
81.0000 mg | DELAYED_RELEASE_TABLET | Freq: Every day | ORAL | Status: DC
Start: 2015-08-24 — End: 2015-08-27
  Administered 2015-08-24 – 2015-08-27 (×4): 81 mg via ORAL
  Filled 2015-08-23 (×4): qty 1

## 2015-08-23 MED ORDER — LIDOCAINE 5 % EX PTCH
1.0000 | MEDICATED_PATCH | CUTANEOUS | Status: DC
  Administered 2015-08-24 – 2015-08-26 (×3): 1 via TRANSDERMAL
  Filled 2015-08-23 (×5): qty 1

## 2015-08-23 MED ORDER — PANTOPRAZOLE SODIUM 40 MG PO TBEC: 40.0000 mg | DELAYED_RELEASE_TABLET | Freq: Every day | ORAL | Status: DC

## 2015-08-23 MED ORDER — ACETAMINOPHEN 325 MG PO TABS
650.0000 mg | ORAL_TABLET | Freq: Four times a day (QID) | ORAL | Status: DC | PRN
  Administered 2015-08-25: 650 mg via ORAL
  Filled 2015-08-23: qty 2

## 2015-08-23 MED ORDER — PANTOPRAZOLE SODIUM 40 MG PO TBEC
40.0000 mg | DELAYED_RELEASE_TABLET | Freq: Every day | ORAL | Status: DC
  Administered 2015-08-24 – 2015-08-27 (×4): 40 mg via ORAL
  Filled 2015-08-23 (×5): qty 1

## 2015-08-23 MED ORDER — VITAMIN B-12 1000 MCG PO TABS
1000.0000 ug | ORAL_TABLET | Freq: Every day | ORAL | Status: DC
  Administered 2015-08-24 – 2015-08-27 (×4): 1000 ug via ORAL
  Filled 2015-08-23 (×4): qty 1

## 2015-08-23 MED ORDER — MIDODRINE HCL 5 MG PO TABS
5.0000 mg | ORAL_TABLET | Freq: Three times a day (TID) | ORAL | Status: DC
  Administered 2015-08-23 – 2015-08-25 (×7): 5 mg via ORAL
  Filled 2015-08-23 (×7): qty 1

## 2015-08-23 MED ORDER — VITAMIN C 500 MG PO TABS
500.0000 mg | ORAL_TABLET | Freq: Every day | ORAL | Status: DC
  Administered 2015-08-24 – 2015-08-27 (×4): 500 mg via ORAL
  Filled 2015-08-23 (×4): qty 1

## 2015-08-23 MED ORDER — FERROUS SULFATE 325 (65 FE) MG PO TABS
325.0000 mg | ORAL_TABLET | Freq: Every day | ORAL | Status: DC
  Administered 2015-08-24 – 2015-08-27 (×3): 325 mg via ORAL
  Filled 2015-08-23 (×3): qty 1

## 2015-08-23 MED ORDER — ONDANSETRON HCL 4 MG/2ML IJ SOLN: 4.0000 mg | Freq: Four times a day (QID) | INTRAMUSCULAR | Status: DC | PRN

## 2015-08-23 MED ORDER — OXYCODONE HCL 5 MG PO TABS
15.0000 mg | ORAL_TABLET | Freq: Four times a day (QID) | ORAL | Status: DC | PRN
  Administered 2015-08-23 – 2015-08-27 (×11): 15 mg via ORAL
  Filled 2015-08-23 (×14): qty 3

## 2015-08-23 MED ORDER — SODIUM CHLORIDE 0.9 % IV SOLN
Freq: Once | INTRAVENOUS | Status: AC
  Administered 2015-08-23: 18:00:00 via INTRAVENOUS

## 2015-08-23 MED ORDER — LORATADINE 10 MG PO TABS: 10.0000 mg | ORAL_TABLET | Freq: Every day | ORAL | Status: DC | PRN

## 2015-08-23 MED ORDER — SPIRONOLACTONE 25 MG PO TABS
25.0000 mg | ORAL_TABLET | Freq: Every day | ORAL | Status: DC
  Administered 2015-08-25 – 2015-08-27 (×3): 25 mg via ORAL
  Filled 2015-08-23 (×3): qty 1

## 2015-08-23 MED ORDER — FLUTICASONE PROPIONATE 50 MCG/ACT NA SUSP
1.0000 | Freq: Every day | NASAL | Status: DC
  Administered 2015-08-24 – 2015-08-27 (×4): 1 via NASAL
  Filled 2015-08-23 (×2): qty 16

## 2015-08-23 NOTE — ED Notes (Signed)
Patient had syncopal episode.  Sternal rub performed and patient awakened with mild confusion.  MD at bedside and another EKG performed.

## 2015-08-23 NOTE — ED Notes (Signed)
Hospitalist called and informed that patient's HR has increased to 150-160 bpm.  Hospitalist verbalized orders for morphine 15mg  and oxycodone 5mg  to help with her pain first.

## 2015-08-23 NOTE — H&P (Signed)
[place] [facility] [place] NAME: Lindsay [name]    MR#:  [id number]  DATE OF BIRTH:  [birth year]  DATE OF ADMISSION:  08/23/2015  PRIMARY CARE PHYSICIAN: Dr Harriet Masson at [facility] cardiologist Dr. Marquette Old at [facility] (patient just saw him on 08/12/2015)  REQUESTING/REFERRING PHYSICIAN: dr good man  CHIEF COMPLAINT:  I passed out today  HISTORY OF PRESENT ILLNESS:  Lindsay [name]  is a 59 y.o. female with a known history of nonischemic cardiac myopathy with EF of around 40-45% by echo in July 2016, chronic neuropathy with chronic pain on by mouth narcotics through [facility] pain clinic, orthostatic hypotension on midodrine, depression, history of breast cancer, comes to the emergency room after she had a syncopal episode at home today. No family member present in the emergency room. Patient has no close family members who live nearby her. Patient is a poor historian. She reports opening the door for her 8 this morning. Felt dizzy lightheaded and fell into her head on the floor. Denies any trauma. CT head is negative for any injury. Patient was brought to the emergency room her blood pressure was in the systolic blood pressure was in the 90s. She had one another syncopal episode in the emergency room while she was in the bathroom because of the nurse. She is at present hemodynamically stable. She gets very restless with her pain and her heart rate jumps into the 120s. She calms down and heart rate comes down in the 1 100s. She is being admitted for recurrent syncopal episode likely vasovagal/orthostatic hypotension. Clinically she appears hydrated. Her first set of cardiac enzyme is negative. EKG does not show any acute ST-T changes. Positive for A. fib.  PAST MEDICAL HISTORY:   Past Medical History  Diagnosis Date  . HX: breast cancer     right  . Peripheral neuropathy ([place])   . Orthostatic hypotension   . Depression   . Vitamin D deficiency    . Anemia   . Depression   . CHF (congestive heart failure) ([place])   . Muscle spasm   . Chronic cough   . C. difficile diarrhea 10/2011  . Kidney stones 2013  . Cancer [facility])     PAST SURGICAL HISTOIRY:   Past Surgical History  Procedure Laterality Date  . Breast lumpectomy      right @ [facility]  . Lithotripsy  jan 2013  . Esophagogastroduodenoscopy (egd) with propofol N/A 05/25/2015    Procedure: ESOPHAGOGASTRODUODENOSCOPY (EGD) WITH PROPOFOL;  Surgeon: Manya Silvas, MD;  Location: [place];  Service: Endoscopy;  Laterality: N/A;    SOCIAL HISTORY:   Social History  Substance Use Topics  . Smoking status: Former Smoker -- 0.25 packs/day for 1 years    Types: Cigarettes    Quit date: 11/10/1985  . Smokeless tobacco: Not on file  . Alcohol Use: No    FAMILY HISTORY:   Family History  Problem Relation Age of Onset  . Heart attack Father     DRUG ALLERGIES:  No Known Allergies  REVIEW OF SYSTEMS:  Review of Systems  Constitutional: Negative for fever, chills and weight loss.  HENT: Negative for ear discharge, ear pain and nosebleeds.   Eyes: Negative for blurred vision, pain and discharge.  Respiratory: Negative for sputum production, shortness of breath, wheezing and stridor.   Cardiovascular: Negative for chest pain, palpitations, orthopnea and PND.  Gastrointestinal: Negative for nausea, vomiting, abdominal pain and diarrhea.  Genitourinary: Negative  for urgency and frequency.  Musculoskeletal: Positive for falls. Negative for back pain and joint pain.  Neurological: Positive for weakness. Negative for sensory change, speech change and focal weakness.  Psychiatric/Behavioral: Negative for depression and hallucinations. The patient is not nervous/anxious.      MEDICATIONS AT HOME:   Prior to Admission medications   Medication Sig Start Date End Date Taking? Authorizing Provider  anastrozole (ARIMIDEX) 1 MG tablet Take 1 mg by mouth daily.     Yes  Historical Provider, MD  aspirin EC 81 MG tablet Take 81 mg by mouth daily.   Yes Historical Provider, MD  Cholecalciferol 10000 UNITS CAPS Take 1,000 Units by mouth daily.   Yes Historical Provider, MD  clonazePAM (KLONOPIN) 0.5 MG tablet Take 0.5 mg by mouth at bedtime as needed for anxiety.    Yes Historical Provider, MD  ferrous sulfate 325 (65 FE) MG tablet Take 325 mg by mouth daily with breakfast.     Yes Historical Provider, MD  fludrocortisone (FLORINEF) 0.1 MG tablet Take 0.2 mg by mouth daily.   Yes Historical Provider, MD  fluticasone (FLONASE) 50 MCG/ACT nasal spray Place 1 spray into the nose daily.    Yes Historical Provider, MD  lidocaine (LIDODERM) 5 % Place 1 patch onto the skin daily. Remove & Discard patch within 12 hours or as directed by MD   Yes Historical Provider, MD  loperamide (IMODIUM) 2 MG capsule Take 2 mg by mouth 4 (four) times daily as needed for diarrhea or loose stools.   Yes Historical Provider, MD  loratadine (CLARITIN) 10 MG tablet Take 10 mg by mouth daily as needed for allergies.    Yes Historical Provider, MD  midodrine (PROAMATINE) 5 MG tablet Take 5 mg by mouth 3 (three) times daily with meals.   Yes Historical Provider, MD  mirtazapine (REMERON) 15 MG tablet Take 15 mg by mouth at bedtime.     Yes Historical Provider, MD  morphine (MS CONTIN) 15 MG 12 hr tablet Take 15 mg by mouth every 8 (eight) hours.   Yes Historical Provider, MD  omeprazole (PRILOSEC) 40 MG capsule Take 40 mg by mouth daily.   Yes Historical Provider, MD  oxyCODONE (ROXICODONE) 15 MG immediate release tablet Take 15 mg by mouth every 6 (six) hours as needed for pain.   Yes Historical Provider, MD  potassium chloride (K-DUR) 10 MEQ tablet Take 20 mEq by mouth 2 (two) times daily.   Yes Historical Provider, MD  pregabalin (LYRICA) 150 MG capsule Take 150 mg by mouth 3 (three) times daily.   Yes Historical Provider, MD  sertraline (ZOLOFT) 50 MG tablet Take 50 mg by mouth daily.   Yes  Historical Provider, MD  spironolactone (ALDACTONE) 25 MG tablet Take 25 mg by mouth daily.   Yes Historical Provider, MD  tamsulosin (FLOMAX) 0.4 MG CAPS capsule Take 1 capsule (0.4 mg total) by mouth daily. 05/25/15  Yes Henreitta Leber, MD  tiZANidine (ZANAFLEX) 2 MG tablet Take 2 mg by mouth 3 (three) times daily.   Yes Historical Provider, MD  vitamin B-12 (CYANOCOBALAMIN) 1000 MCG tablet Take 1,000 mcg by mouth daily.    Yes Historical Provider, MD  vitamin C (ASCORBIC ACID) 500 MG tablet Take 500 mg by mouth daily.   Yes Historical Provider, MD      VITAL SIGNS:  Blood pressure 93/66, pulse 43, temperature 98 F (36.7 C), temperature source Oral, resp. rate 22, height 5\' 6"  (1.676 m), weight 53.978 kg (  119 lb), SpO2 100 %.  PHYSICAL EXAMINATION:  GENERAL:  59 y.o.-year-old patient lying in the bed with no acute distress.  EYES: Pupils equal, round, reactive to light and accommodation. No scleral icterus. Extraocular muscles intact.  HEENT: Head atraumatic, normocephalic. Oropharynx and nasopharynx clear.  NECK:  Supple, no jugular venous distention. No thyroid enlargement, no tenderness.  LUNGS: Normal breath sounds bilaterally, no wheezing, rales,rhonchi or crepitation. No use of accessory muscles of respiration.  CARDIOVASCULAR: S1, S2 normal. No murmurs, rubs, or gallops.  ABDOMEN: Soft, nontender, nondistended. Bowel sounds present. No organomegaly or mass.  EXTREMITIES: No pedal edema, cyanosis, or clubbing.  NEUROLOGIC: Cranial nerves II through XII are intact. Muscle strength 5/5 in all extremities. Sensation intact. Gait not checked.  PSYCHIATRIC: The patient is alert and oriented x 3.  SKIN: No obvious rash, lesion, or ulcer.   LABORATORY PANEL:   CBC  Recent Labs Lab 08/23/15 1355  WBC 8.4  HGB 10.2*  HCT 31.5*  PLT 172   ------------------------------------------------------------------------------------------------------------------  Chemistries   Recent  Labs Lab 08/23/15 1355  NA 140  K 4.0  CL 113*  CO2 24  GLUCOSE 85  BUN 16  CREATININE 1.01*  CALCIUM 8.9   ------------------------------------------------------------------------------------------------------------------  Cardiac Enzymes  Recent Labs Lab 08/23/15 1355  TROPONINI <0.03   ------------------------------------------------------------------------------------------------------------------  RADIOLOGY:  Dg Chest 2 View  08/23/2015  CLINICAL DATA:  Chest pain EXAM: CHEST  2 VIEW COMPARISON:  05/23/2015 FINDINGS: Cardiomediastinal silhouette is stable. Surgical clips are noted in right axilla. No acute infiltrate or pleural effusion. No pulmonary edema. Mild degenerative changes thoracic spine. IMPRESSION: No active cardiopulmonary disease. Electronically Signed   By: Lahoma Crocker M.D.   On: 08/23/2015 14:26   Ct Head Wo Contrast  08/23/2015  CLINICAL DATA:  Syncope EXAM: CT HEAD WITHOUT CONTRAST TECHNIQUE: Contiguous axial images were obtained from the base of the skull through the vertex without intravenous contrast. COMPARISON:  01/12/2015 FINDINGS: Mild global atrophy. Minimal chronic ischemic changes in the periventricular white matter. No mass effect, midline shift, or acute hemorrhage. Mastoid air cells are clear. Cranium is intact. IMPRESSION: No acute intracranial pathology. Electronically Signed   By: Marybelle Killings M.D.   On: 08/23/2015 14:41    EKG:  A. fib with RVR heart rate 117  IMPRESSION AND PLAN:   59 y.o. female with a known history of nonischemic cardiac myopathy with EF of around 40-45% by echo in July 2016, chronic neuropathy with chronic pain on by mouth narcotics through [facility] pain [facility], orthostatic hypotension on midodrine, depression, history of breast cancer, comes to the emergency room after she had a syncopal episode at home today.  1. Syncopal episode, recurrent. Patient has known history of syncopal episode suspected due to her postural  hypotension she's been doing this for many years. She had 2 episodes today at home and one in the emergency room. She appears medically stable at present. Low blood pressure getting IV fluids. Patient currently is asymptomatic. First set of cardiac enzymes negative. We will monitor patient on telemetry floor. Cycle cardiac enzymes 2. She has seen her primary cardiologist Dr. Marquette Old at 2. Patient is scheduled to have outpatient stress test. If she rules out for MI then consider her continuing her plan with outpatient stress test as scheduled. As needed cardiac consultation. We'll continue her midodrine.  2. Chronic polyneuropathy suspected due to her chronic chemotherapy for breast cancer. This information is obtained from old records. We'll continue her MS Contin and oxycodone  as she takes at home. I will not order any more IV pain medication. Patient follows at [facility] pain clinic.  3. Postural hypotension Physical therapy to see patient. She ambulates at home. She has a equal help her out. Her blood pressure is stable at present. She usually runs low blood pressure systolic. Continue Midodrin.  4. Depression continue Zoloft  5. History of chronic/paroxysmal A. Fib We'll continue to monitor her heart rate on telemetry. She is currently not on any rate blocking agent I suspect that is because of her chronically low blood pressure. We'll add low-dose Coreg if needed. No anticoagulation at present due to her recurrent falls. We'll defer it to her outpatient cardiologist.  6. Generalized weakness falls PT to evaluate.   All the records are reviewed and case discussed with ED provider. Management plans discussed with the patient, family and they are in agreement.  CODE STATUS:   TOTAL TIME TAKING CARE OF THIS PATIENT:55 minutes.    Laruth Hanger M.D on 08/23/2015 at 3:29 PM  Between 7am to 6pm - Pager - [phone]  After 6pm go to [email or web address] - password [email or web address] [facility]  Office  [phone]  CC: Primary care physician; Pcp Not In System

## 2015-08-23 NOTE — ED Notes (Signed)
MD at bedside. 

## 2015-08-23 NOTE — ED Notes (Signed)
Called the floor to give report, floor unable to take patient due to HR.  Informed them I would call them back once we had a decreased HR.

## 2015-08-23 NOTE — ED Provider Notes (Signed)
[facility] Emergency Department Provider Note    ____________________________________________  Time seen: 1355  I have reviewed the triage vital signs and the nursing notes.   HISTORY  Chief Complaint Chest Pain   History limited by: Not Limited   HPI Lindsay [name] is a 59 y.o. female who presents to the emergency department today with concerns for chest pain and syncopal episodes. The patient states she first started developing chest pain yesterday. She describes it as being located in the right chest. It did radiate up into her right jaw. She describes it as sharp. She states it did have some accompanying shortness of breath. She states today she had 3 syncopal episodes. She states it happened after she would get up. She says she has fainted in the past when her potassium is been low. Patient denies any recent fevers. When EMS arrived on scene the patient had a heart rate in the 160s 170s.  Past Medical History  Diagnosis Date  . HX: breast cancer     right  . Peripheral neuropathy ([place])   . Orthostatic hypotension   . Depression   . Vitamin D deficiency   . Anemia   . Depression   . CHF (congestive heart failure) ([place])   . Muscle spasm   . Chronic cough   . C. difficile diarrhea 10/2011  . Kidney stones 2013  . Cancer [facility])     Patient Active Problem List   Diagnosis Date Noted  . Atrial fibrillation ([place]) 05/24/2015  . Abdominal pain 05/23/2015  . C. difficile colitis 12/12/2011  . Chest pain 11/11/2011  . Syncope 11/11/2011  . Hypotension 11/11/2011  . Cardiomyopathy 11/11/2011  . AF (atrial fibrillation) ([place]) 11/11/2011    Past Surgical History  Procedure Laterality Date  . Breast lumpectomy      right @ [facility]  . Lithotripsy  jan 2013  . Esophagogastroduodenoscopy (egd) with propofol N/A 05/25/2015    Procedure: ESOPHAGOGASTRODUODENOSCOPY (EGD) WITH PROPOFOL;  Surgeon: Manya Silvas, MD;  Location: [place];  Service:  Endoscopy;  Laterality: N/A;    Current Outpatient Rx  Name  Route  Sig  Dispense  Refill  . anastrozole (ARIMIDEX) 1 MG tablet   Oral   Take 1 mg by mouth daily.           Marland Kitchen aspirin EC 81 MG tablet   Oral   Take 81 mg by mouth daily.         . Cholecalciferol 10000 UNITS CAPS   Oral   Take 1,000 Units by mouth daily.         . clonazePAM (KLONOPIN) 0.5 MG tablet   Oral   Take 0.5 mg by mouth 2 (two) times daily as needed for anxiety.         . ferrous sulfate 325 (65 FE) MG tablet   Oral   Take 325 mg by mouth daily with breakfast.           . fludrocortisone (FLORINEF) 0.1 MG tablet   Oral   Take 0.2 mg by mouth daily.         . fluticasone (FLONASE) 50 MCG/ACT nasal spray   Nasal   Place 1 spray into the nose daily.          Marland Kitchen lidocaine (LIDODERM) 5 %   Transdermal   Place 1 patch onto the skin daily. Remove & Discard patch within 12 hours or as directed by MD         .  loperamide (IMODIUM) 2 MG capsule   Oral   Take 2 mg by mouth 4 (four) times daily as needed for diarrhea or loose stools.         Marland Kitchen loratadine (CLARITIN) 10 MG tablet   Oral   Take 10 mg by mouth daily as needed for allergies.          . midodrine (PROAMATINE) 5 MG tablet   Oral   Take 5 mg by mouth 3 (three) times daily with meals.         . mirtazapine (REMERON) 15 MG tablet   Oral   Take 15 mg by mouth at bedtime.           Marland Kitchen morphine (MS CONTIN) 15 MG 12 hr tablet   Oral   Take 15 mg by mouth every 8 (eight) hours.         Marland Kitchen omeprazole (PRILOSEC) 40 MG capsule   Oral   Take 40 mg by mouth daily.         Marland Kitchen oxyCODONE (ROXICODONE) 15 MG immediate release tablet   Oral   Take 15 mg by mouth every 6 (six) hours as needed for pain.         . potassium chloride (K-DUR) 10 MEQ tablet   Oral   Take 20 mEq by mouth 2 (two) times daily.         . pregabalin (LYRICA) 150 MG capsule   Oral   Take 150 mg by mouth 3 (three) times daily.         .  sertraline (ZOLOFT) 50 MG tablet   Oral   Take 50 mg by mouth daily.         Marland Kitchen spironolactone (ALDACTONE) 25 MG tablet   Oral   Take 25 mg by mouth daily.         . tamsulosin (FLOMAX) 0.4 MG CAPS capsule   Oral   Take 1 capsule (0.4 mg total) by mouth daily.   30 capsule   1   . tiZANidine (ZANAFLEX) 2 MG tablet   Oral   Take 2 mg by mouth 3 (three) times daily.         . vitamin B-12 (CYANOCOBALAMIN) 1000 MCG tablet   Oral   Take 1,000 mcg by mouth daily.          . vitamin C (ASCORBIC ACID) 500 MG tablet   Oral   Take 500 mg by mouth daily.           Allergies Review of patient's allergies indicates no known allergies.  Family History  Problem Relation Age of Onset  . Heart attack Father     Social History Social History  Substance Use Topics  . Smoking status: Former Smoker -- 0.25 packs/day for 1 years    Types: Cigarettes    Quit date: 11/10/1985  . Smokeless tobacco: None  . Alcohol Use: No    Review of Systems  Constitutional: Negative for fever. Cardiovascular: Positive for chest pain. Respiratory: Negative for shortness of breath. Gastrointestinal: Negative for abdominal pain, vomiting and diarrhea. Genitourinary: Negative for dysuria. Musculoskeletal: Negative for back pain. Skin: Negative for rash. Neurological: Negative for headaches, focal weakness or numbness.  10-point ROS otherwise negative.  ____________________________________________   PHYSICAL EXAM:  VITAL SIGNS: ED Triage Vitals  Enc Vitals Group     BP 08/23/15 1326 93/67 mmHg     Pulse Rate 08/23/15 1326 99     Resp 08/23/15 1326 25  Temp 08/23/15 1326 98 F (36.7 C)     Temp Source 08/23/15 1326 Oral     SpO2 08/23/15 1326 100 %     Weight 08/23/15 1326 119 lb (53.978 kg)     Height 08/23/15 1326 5\' 6"  (1.676 m)   Constitutional: Alert and oriented. Well appearing and in no distress. Eyes: Conjunctivae are normal. PERRL. Normal extraocular  movements. ENT   Head: Normocephalic and atraumatic.   Nose: No congestion/rhinnorhea.   Mouth/Throat: Mucous membranes are moist.   Neck: No stridor. Hematological/Lymphatic/Immunilogical: No cervical lymphadenopathy. Cardiovascular: Normal rate, regular rhythm.  No murmurs, rubs, or gallops. Respiratory: Normal respiratory effort without tachypnea nor retractions. Breath sounds are clear and equal bilaterally. No wheezes/rales/rhonchi. Gastrointestinal: Soft and nontender. No distention.  Genitourinary: Deferred Musculoskeletal: Normal range of motion in all extremities. No joint effusions.  No lower extremity tenderness nor edema. Neurologic:  Normal speech and language. No gross focal neurologic deficits are appreciated. Speech is normal.  Skin:  Skin is warm, dry and intact. No rash noted. Psychiatric: Mood and affect are normal. Speech and behavior are normal. Patient exhibits appropriate insight and judgment.  ____________________________________________    LABS (pertinent positives/negatives)  Labs Reviewed  BASIC METABOLIC PANEL - Abnormal; Notable for the following:    Chloride 113 (*)    Creatinine, Ser 1.01 (*)    Anion gap 3 (*)    All other components within normal limits  CBC - Abnormal; Notable for the following:    RBC 3.62 (*)    Hemoglobin 10.2 (*)    HCT 31.5 (*)    RDW 16.4 (*)    All other components within normal limits  TROPONIN I     ____________________________________________   EKG  I, Nance Pear, attending physician, personally viewed and interpreted this EKG  EKG Time: 1323 Rate: 107 Rhythm: afib Axis: normal Intervals: qtc normal QRS: narrow ST changes: no st elevation Impression: abnormal ekg  I, Nance Pear, attending physician, personally viewed and interpreted this EKG  EKG Time: 1416 Rate: 117 Rhythm: afib with rvr Axis: normal Intervals: qtc 435 QRS: narrow, q waves V1 ST changes: no st  elevation Impression: abnormal ekg  ____________________________________________    RADIOLOGY  CT head IMPRESSION: No acute intracranial pathology.  CXR  IMPRESSION: No active cardiopulmonary disease.   ____________________________________________   PROCEDURES  Procedure(s) performed: None  Critical Care performed: No  ____________________________________________   INITIAL IMPRESSION / ASSESSMENT AND PLAN / ED COURSE  Pertinent labs & imaging results that were available during my care of the patient were reviewed by me and considered in my medical decision making (see chart for details).  Patient presents to the emergency department today with concerns for chest pain and multiple syncopal episodes. Whilst here in the emergency department she did have a syncopal episode whilst using the restroom. Luckily a nurse was with her and was able to catch her so she did not fall. Head CT and chest x-ray without abnormal findings. Blood work without an elevated troponin. At this point unclear the etiology of the patient's syncopal episodes. She does have a history of heart failure. I do wonder if a vasovagal has a high component given that it occurred whilst on the toilet. However given the patient's risk factors I feel she would be best served by being admitted to the hospital for further workup and management.  ____________________________________________   FINAL CLINICAL IMPRESSION(S) / ED DIAGNOSES  Final diagnoses:  Syncope and collapse  Chest pain, unspecified  chest pain type     Nance Pear, MD 08/23/15 [phone]

## 2015-08-23 NOTE — ED Notes (Signed)
Pt comes into the ED via EMS c/o chest pain on the right side that radiates up into her jaw.  Patient has had several syncopal episodes today including one where she had LOC and hit her head on the left side .  Patient was in a-fib upon arrival of EMS in the rate of 160-170.  Patient back down to 100-108 currently.  Denies any pain currently.  147 cBG, 98 % room air, NKDA 106/80. Patient has previously had these episodes occur and it was related to low potassium.

## 2015-08-24 ENCOUNTER — Encounter: Payer: Self-pay | Admitting: Physician Assistant

## 2015-08-24 ENCOUNTER — Observation Stay: Payer: Medicare Other

## 2015-08-24 ENCOUNTER — Inpatient Hospital Stay: Payer: Medicare Other

## 2015-08-24 DIAGNOSIS — T451X5A Adverse effect of antineoplastic and immunosuppressive drugs, initial encounter: Secondary | ICD-10-CM | POA: Diagnosis present

## 2015-08-24 DIAGNOSIS — I951 Orthostatic hypotension: Secondary | ICD-10-CM | POA: Diagnosis not present

## 2015-08-24 DIAGNOSIS — Z9221 Personal history of antineoplastic chemotherapy: Secondary | ICD-10-CM | POA: Diagnosis not present

## 2015-08-24 DIAGNOSIS — Z853 Personal history of malignant neoplasm of breast: Secondary | ICD-10-CM | POA: Diagnosis not present

## 2015-08-24 DIAGNOSIS — I480 Paroxysmal atrial fibrillation: Secondary | ICD-10-CM | POA: Diagnosis not present

## 2015-08-24 DIAGNOSIS — E559 Vitamin D deficiency, unspecified: Secondary | ICD-10-CM | POA: Diagnosis present

## 2015-08-24 DIAGNOSIS — D649 Anemia, unspecified: Secondary | ICD-10-CM | POA: Diagnosis present

## 2015-08-24 DIAGNOSIS — R55 Syncope and collapse: Secondary | ICD-10-CM | POA: Insufficient documentation

## 2015-08-24 DIAGNOSIS — N390 Urinary tract infection, site not specified: Secondary | ICD-10-CM | POA: Diagnosis present

## 2015-08-24 DIAGNOSIS — G8929 Other chronic pain: Secondary | ICD-10-CM | POA: Diagnosis present

## 2015-08-24 DIAGNOSIS — Z79899 Other long term (current) drug therapy: Secondary | ICD-10-CM | POA: Diagnosis not present

## 2015-08-24 DIAGNOSIS — F329 Major depressive disorder, single episode, unspecified: Secondary | ICD-10-CM | POA: Diagnosis present

## 2015-08-24 DIAGNOSIS — R296 Repeated falls: Secondary | ICD-10-CM | POA: Diagnosis present

## 2015-08-24 DIAGNOSIS — Z7951 Long term (current) use of inhaled steroids: Secondary | ICD-10-CM | POA: Diagnosis not present

## 2015-08-24 DIAGNOSIS — I5022 Chronic systolic (congestive) heart failure: Secondary | ICD-10-CM | POA: Diagnosis present

## 2015-08-24 DIAGNOSIS — T671XXS Heat syncope, sequela: Secondary | ICD-10-CM | POA: Diagnosis not present

## 2015-08-24 DIAGNOSIS — G620 Drug-induced polyneuropathy: Secondary | ICD-10-CM | POA: Diagnosis present

## 2015-08-24 DIAGNOSIS — Z8249 Family history of ischemic heart disease and other diseases of the circulatory system: Secondary | ICD-10-CM | POA: Diagnosis not present

## 2015-08-24 DIAGNOSIS — K529 Noninfective gastroenteritis and colitis, unspecified: Secondary | ICD-10-CM | POA: Diagnosis present

## 2015-08-24 DIAGNOSIS — R29898 Other symptoms and signs involving the musculoskeletal system: Secondary | ICD-10-CM | POA: Diagnosis not present

## 2015-08-24 DIAGNOSIS — F3341 Major depressive disorder, recurrent, in partial remission: Secondary | ICD-10-CM | POA: Diagnosis not present

## 2015-08-24 DIAGNOSIS — Z818 Family history of other mental and behavioral disorders: Secondary | ICD-10-CM | POA: Diagnosis not present

## 2015-08-24 DIAGNOSIS — R079 Chest pain, unspecified: Secondary | ICD-10-CM

## 2015-08-24 DIAGNOSIS — E46 Unspecified protein-calorie malnutrition: Secondary | ICD-10-CM | POA: Diagnosis present

## 2015-08-24 DIAGNOSIS — C50911 Malignant neoplasm of unspecified site of right female breast: Secondary | ICD-10-CM | POA: Diagnosis present

## 2015-08-24 DIAGNOSIS — Z681 Body mass index (BMI) 19 or less, adult: Secondary | ICD-10-CM | POA: Diagnosis not present

## 2015-08-24 DIAGNOSIS — Z87442 Personal history of urinary calculi: Secondary | ICD-10-CM | POA: Diagnosis not present

## 2015-08-24 DIAGNOSIS — Z9884 Bariatric surgery status: Secondary | ICD-10-CM | POA: Diagnosis not present

## 2015-08-24 DIAGNOSIS — Z7982 Long term (current) use of aspirin: Secondary | ICD-10-CM | POA: Diagnosis not present

## 2015-08-24 DIAGNOSIS — F419 Anxiety disorder, unspecified: Secondary | ICD-10-CM | POA: Diagnosis present

## 2015-08-24 DIAGNOSIS — Z87891 Personal history of nicotine dependence: Secondary | ICD-10-CM | POA: Diagnosis not present

## 2015-08-24 DIAGNOSIS — I429 Cardiomyopathy, unspecified: Secondary | ICD-10-CM | POA: Diagnosis present

## 2015-08-24 LAB — TROPONIN I: Troponin I: <0.03 ng/mL (ref <0.031)

## 2015-08-24 MED ORDER — DIGOXIN 0.25 MG/ML IJ SOLN
0.5000 mg | Freq: Once | INTRAMUSCULAR | Status: AC
  Administered 2015-08-24: 0.5 mg via INTRAVENOUS
  Filled 2015-08-24 (×2): qty 2

## 2015-08-24 MED ORDER — MORPHINE SULFATE ER 15 MG PO TBCR
15.0000 mg | EXTENDED_RELEASE_TABLET | Freq: Three times a day (TID) | ORAL | Status: DC
  Administered 2015-08-24 – 2015-08-27 (×9): 15 mg via ORAL
  Filled 2015-08-24 (×10): qty 1

## 2015-08-24 MED ORDER — DIGOXIN 125 MCG PO TABS
0.1250 mg | ORAL_TABLET | Freq: Every day | ORAL | Status: DC
  Administered 2015-08-25: 0.125 mg via ORAL
  Filled 2015-08-24: qty 1

## 2015-08-24 MED ORDER — NITROGLYCERIN 0.4 MG SL SUBL
0.4000 mg | SUBLINGUAL_TABLET | SUBLINGUAL | Status: DC | PRN
  Administered 2015-08-24: 0.4 mg via SUBLINGUAL
  Filled 2015-08-24: qty 1

## 2015-08-24 NOTE — Evaluation (Signed)
Physical Therapy Evaluation Patient Details Name: Lindsay [name] MRN: [id number] DOB: [birth year] Today's Date: 08/24/2015   History of Present Illness  Pt is a 59 y.o. female presenting to hospital with syncopal episode at home; pt then with syncopal episode in ED and also another syncopal episode upon admission.  Pt admitted with syncope, a-fib, RVR, and orthostatic hypotension.  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  Clinical Impression  Currently pt demonstrates impairments with strength and activity tolerance.  Prior to admission, pt was modified independent using SPC usually for short distances and power w/c for longer distances; pt has aides 7 days a week.  Pt lives alone in apt with level entry.  Nursing requesting in bed ex's only today for safety d/t pt with syncopal episodes this morning.  Impaired ability to initiate movement with L knee extension noted.  Pt would benefit from skilled PT to address above noted impairments and anticipated functional limitations.  Need to further assess functional mobility but anticipate pt will be able to discharge home with HHPT (pt reports it is possible to go home w/c level initially if needed).     Follow Up Recommendations  (Home with HHPT pending further assessment of mobility)    Equipment Recommendations       Recommendations for Other Services       Precautions / Restrictions Precautions Precautions: Fall Precaution Comments: Orthostatic hypotension Restrictions Weight Bearing Restrictions: No      Mobility  Bed Mobility               General bed mobility comments: Deferred d/t nursing requesting in bed ex's only d/t syncopal episodes this morning  Transfers                    Ambulation/Gait                Stairs            Wheelchair Mobility    Modified Rankin (Stroke  Patients Only)       Balance                                             Pertinent Vitals/Pain Pain Assessment: No/denies pain  Vitals stable and WFL throughout treatment session.    Home Living Family/patient expects to be discharged to:: Private residence Living Arrangements: Alone Available Help at Discharge: [place] [employer] (Has aides 7 days a week (3 hours each day M-F; 2 hours each day Sat and Sunday)) Type of Home: Apartment Home Access: Level entry;Elevator     Home Layout: One level Home Equipment: [place] - 4 wheels;Wheelchair - Education officer, community - power;Cane - single point      Prior Function Level of Independence: Needs assistance   Gait / Transfers Assistance Needed: Pt normally uses SPC for household distances and power w/c for longer distances (take out trash, walk her dog, go to [employer], etc).  Pt will use whatever device she needs depending on how she feels though.  ADL's / Homemaking Assistance Needed: Aides 7 days a week (3 hours/day M-F and 2 hours/day Sat and Sunday)  Comments: Transportation by aides.     Hand Dominance        Extremity/Trunk Assessment   Upper Extremity Assessment: Generalized weakness (R hand grip significantly weaker  than L hand grip)           Lower Extremity Assessment: Generalized weakness (L knee extension 2+/5 (difficult to assess though d/t difficulty initiating L knee extension)      Cervical / Trunk Assessment: Normal  Communication   Communication: No difficulties  Cognition   Behavior During Therapy: WFL for tasks assessed/performed Overall Cognitive Status: Within Functional Limits for tasks assessed                      General Comments   Nursing cleared pt for participation in physical therapy but bed level ex's only d/t syncopal episodes earlier today.  Pt agreeable to PT session.     Exercises   Performed semi-supine B LE therapeutic exercise x 10 reps:  Ankle pumps (AROM B  LE's); quad sets x3 second holds (AROM B LE's); glute squeezes x3 second holds (AROM B); SAQ's (AROM R; AAROM L); heelslides (AROM R; AROM L), hip abd/adduction (AROM R; AROM L).  Pt required vc's and tactile cues for correct technique with exercises.       Assessment/Plan    PT Assessment Patient needs continued PT services  PT Diagnosis Generalized weakness   PT Problem List Decreased strength;Decreased activity tolerance;Decreased mobility;Impaired sensation;Cardiopulmonary status limiting activity  PT Treatment Interventions DME instruction;Gait training;Functional mobility training;Therapeutic activities;Therapeutic exercise;Balance training;Neuromuscular re-education;Patient/family education;Wheelchair mobility training   PT Goals (Current goals can be found in the Care Plan section) Acute Rehab PT Goals Patient Stated Goal: to go home PT Goal Formulation: With patient Time For Goal Achievement: 09/07/15 Potential to Achieve Goals: Fair    Frequency Min 2X/week   Barriers to discharge        Co-evaluation               End of Session   Activity Tolerance: Patient tolerated treatment well Patient left: in bed;with call bell/phone within reach;with bed alarm set           Time: 1550-1613 PT Time Calculation (min) (ACUTE ONLY): 23 min   Charges:   PT Evaluation $Initial PT Evaluation Tier I: 1 Procedure PT Treatments $Therapeutic Exercise: 8-22 mins   PT G CodesLeitha Bleak 2015/08/27, 4:50 PM Leitha Bleak, [place]

## 2015-08-24 NOTE — Progress Notes (Signed)
Patient had another syncope episode after using BSC this am before going down for EEG. Vss at this time. Patient answered questions appropriately, Dr. Benjie Karvonen notified. Patient off floor for EEG at this time.  Wilnette Kales

## 2015-08-24 NOTE — Progress Notes (Signed)
Responded to rapid response for syncopal episode.  Patient admitted here for recurrent syncope.  Had episode tonight.  However, after syncope patient was slow to return to responsive cognition.  Vitals stable, but patient had waxing waning episodes of intermittently following commands.  These coincided with "staring" episodes lasting for 30 sec to a couple minutes each.  Patient would then become responsive.  Complains of jaw pain and later chest pain.  Chronic afib. Workup so far neg, has had multiple admissions for syncope and felt to be cause by polyneuropathy and autonomic dysfunction with orthostatic hypotension.  Patient has been on and off midodrine due to combined hypertension and orthostatic hypotension.  CT head on admission less than 24 hours ago neg.  Will treat chest pain with prn nitro, STAT EKG.  Cardiology consult already ordered on admission, will add neurology consult for question of possible partial focal seizure?  Jacqulyn Bath [facility] 08/24/2015, 4:22 AM

## 2015-08-24 NOTE — Consult Note (Signed)
CC: syncope  HPI: Lindsay [name] is an 59 y.o. female e with h/o presumed NICM/chronic systolic CHF with prior EF 30-35% in 2011 improved to 45% in 2014, PAF not on long term full dose long term anticoagulation, history of breast cancer s/p chemotherapy (Docetaxol, cytoxan, adriamycin, and lumpectomy) in 7253 complicated by chronic neuropathy, postural hypotension, orthostatic syncope, sensorimortor neuropathy.  Admitted with multiple syncopal episodes, A-fib with RVR    Past Medical History  Diagnosis Date  . HX: breast cancer     right  . Peripheral neuropathy ([place])   . Orthostatic hypotension   . Depression   . Vitamin D deficiency   . Anemia   . Depression   . Chronic systolic CHF (congestive heart failure) ([place])   . Muscle spasm   . Chronic cough   . C. difficile diarrhea 10/2011  . Kidney stones 2013  . Cancer ([place])   . PAF (paroxysmal atrial fibrillation) [facility])     Past Surgical History  Procedure Laterality Date  . Breast lumpectomy      right @ [facility]  . Lithotripsy  jan 2013  . Esophagogastroduodenoscopy (egd) with propofol N/A 05/25/2015    Procedure: ESOPHAGOGASTRODUODENOSCOPY (EGD) WITH PROPOFOL;  Surgeon: Manya Silvas, MD;  Location: [place];  Service: Endoscopy;  Laterality: N/A;    Family History  Problem Relation Age of Onset  . Heart attack Father     Social History:  reports that she quit smoking about 29 years ago. Her smoking use included Cigarettes. She has a .25 pack-year smoking history. She does not have any smokeless tobacco history on file. She reports that she does not drink alcohol or use illicit drugs.  No Known Allergies  Medications: I have reviewed the patient's current medications.  ROS: History obtained from the patient  General ROS: negative for - chills, fatigue, fever, night sweats, weight gain or weight loss Psychological ROS: negative for -anxiety and depression  Ophthalmic ROS: negative for - blurry vision, double  vision, eye pain or loss of vision ENT ROS: negative for - epistaxis, nasal discharge, oral lesions, sore throat, tinnitus or vertigo Allergy and Immunology ROS: negative for - hives or itchy/watery eyes Hematological and Lymphatic ROS: negative for - bleeding problems, bruising or swollen lymph nodes Endocrine ROS: negative for - galactorrhea, hair pattern changes, polydipsia/polyuria or temperature intolerance Respiratory ROS: negative for - cough, hemoptysis, shortness of breath or wheezing Cardiovascular ROS: negative for -A-fib rvr Gastrointestinal ROS: negative for - abdominal pain, diarrhea, hematemesis, nausea/vomiting or stool incontinence Genito-Urinary ROS: negative for - dysuria, hematuria, incontinence or urinary frequency/urgency Musculoskeletal ROS: negative for - joint swelling or muscular weakness Neurological ROS: as noted in HPI Dermatological ROS: negative for rash and skin lesion changes  Physical Examination: Blood pressure 87/63, pulse 91, temperature 97.3 F (36.3 C), temperature source Oral, resp. rate 18, height 5\' 6"  (1.676 m), weight 53.978 kg (119 lb), SpO2 100 %.  Neurological Examination Mental Status: Alert, oriented, thought content appropriate.  Speech fluent without evidence of aphasia.  Able to follow 3 step commands without difficulty. Cranial Nerves: II: Discs flat bilaterally; Visual fields grossly normal, pupils equal, round, reactive to light and accommodation III,IV, VI: ptosis not present, extra-ocular motions intact bilaterally V,VII: smile symmetric, facial light touch sensation normal bilaterally VIII: hearing normal bilaterally IX,X: gag reflex present XI: bilateral shoulder shrug XII: midline tongue extension Motor: Right : Upper extremity   5/5    Left:     Upper extremity  5/5  Lower extremity   5/5     Lower extremity   5/5 Tone and bulk:normal tone throughout; no atrophy noted Sensory: Pinprick and light touch intact throughout,  bilaterally Deep Tendon Reflexes: 1+ and symmetric throughout Plantars: Right: downgoing   Left: downgoing Cerebellar: normal finger-to-nose, normal rapid alternating movements and normal heel-to-shin test Gait: not tested       Laboratory Studies:   Basic Metabolic Panel:  Recent Labs Lab 08/23/15 1355  NA 140  K 4.0  CL 113*  CO2 24  GLUCOSE 85  BUN 16  CREATININE 1.01*  CALCIUM 8.9    Liver Function Tests: No results for input(s): AST, ALT, ALKPHOS, BILITOT, PROT, ALBUMIN in the last 168 hours. No results for input(s): LIPASE, AMYLASE in the last 168 hours. No results for input(s): AMMONIA in the last 168 hours.  CBC:  Recent Labs Lab 08/23/15 1355  WBC 8.4  HGB 10.2*  HCT 31.5*  MCV 86.9  PLT 172    Cardiac Enzymes:  Recent Labs Lab 08/23/15 1355 08/23/15 1753 08/23/15 2235 08/24/15 0425  TROPONINI <0.03 <0.03 <0.03 <0.03    BNP: Invalid input(s): POCBNP  CBG: No results for input(s): GLUCAP in the last 168 hours.  Microbiology: Results for orders placed or performed during the hospital encounter of 05/23/15  Stool culture     Status: None   Collection Time: 05/24/15  6:47 PM  Result Value Ref Range Status   Specimen Description STOOL  Final   Special Requests NONE  Final   Culture   Final    NO SALMONELLA OR SHIGELLA ISOLATED No Pathogenic E. coli detected NO CAMPYLOBACTER DETECTED    Report Status 05/29/2015 FINAL  Final    Coagulation Studies: No results for input(s): LABPROT, INR in the last 72 hours.  Urinalysis: No results for input(s): COLORURINE, LABSPEC, PHURINE, GLUCOSEU, HGBUR, BILIRUBINUR, KETONESUR, PROTEINUR, UROBILINOGEN, NITRITE, LEUKOCYTESUR in the last 168 hours.  Invalid input(s): APPERANCEUR  Lipid Panel:  No results found for: CHOL, TRIG, HDL, CHOLHDL, VLDL, LDLCALC  HgbA1C: No results found for: HGBA1C  Urine Drug Screen:     Component Value Date/Time   LABOPIA POSITIVE 05/31/2012 0035   LABBENZ  NEGATIVE 05/31/2012 0035   AMPHETMU NEGATIVE 05/31/2012 0035   THCU NEGATIVE 05/31/2012 0035   LABBARB NEGATIVE 05/31/2012 0035    Alcohol Level: No results for input(s): ETH in the last 168 hours.  Other results:   Imaging: Dg Chest 2 View  08/23/2015  CLINICAL DATA:  Chest pain EXAM: CHEST  2 VIEW COMPARISON:  05/23/2015 FINDINGS: Cardiomediastinal silhouette is stable. Surgical clips are noted in right axilla. No acute infiltrate or pleural effusion. No pulmonary edema. Mild degenerative changes thoracic spine. IMPRESSION: No active cardiopulmonary disease. Electronically Signed   By: Lahoma Crocker M.D.   On: 08/23/2015 14:26   Ct Head Wo Contrast  08/23/2015  CLINICAL DATA:  Syncope EXAM: CT HEAD WITHOUT CONTRAST TECHNIQUE: Contiguous axial images were obtained from the base of the skull through the vertex without intravenous contrast. COMPARISON:  01/12/2015 FINDINGS: Mild global atrophy. Minimal chronic ischemic changes in the periventricular white matter. No mass effect, midline shift, or acute hemorrhage. Mastoid air cells are clear. Cranium is intact. IMPRESSION: No acute intracranial pathology. Electronically Signed   By: Marybelle Killings M.D.   On: 08/23/2015 14:41     Assessment/Plan:  Lindsay [name] is an 59 y.o. female e with h/o presumed NICM/chronic systolic CHF with prior EF 30-35% in 2011 improved to 45%  in 2014, PAF not on long term full dose long term anticoagulation, history of breast cancer s/p chemotherapy (Docetaxol, cytoxan, adriamycin, and lumpectomy) in 9276 complicated by chronic neuropathy, postural hypotension, orthostatic syncope, sensorimortor neuropathy.  Admitted with multiple syncopal episodes, A-fib with RVR   - appreciate cardiology evaluation  - this is very unlikely related to seizure activity, EEG ordered will review.  - CHADS 2 with A-fib would consider anticoagulating - no further imaging from neuro stand point.    08/24/2015, 1:03 PM

## 2015-08-24 NOTE — Care Management Note (Signed)
Case Management Note  Patient Details  Name: Lindsay [name] MRN: [id number] Date of Birth: [birth year]  Subjective/Objective:                 Patient presents from home with syncopal episode.  Patient has an extensive history.  Patient lives at home alone, and does not have any family support.  Patient states that she has close friends near by.  Patient has an aide in the home Monday through Friday for 3 hours a day, and on the weekends for 2 hours a day.  Aide is provided [employer].  Patient states that she has a wheel chair, walker, cane, and electric wheelchair in the home.  Patient is transported by her aides, and medicaid transportation when needed.     Action/Plan: PT eval in place.  RNCM  Expected Discharge Date:                  Expected Discharge Plan:     In-House Referral:     Discharge planning Services     Post Acute Care Choice:    Choice offered to:     DME Arranged:    DME Agency:     HH Arranged:    [place] Agency:     Status of Service:     Medicare Important Message Given:    Date Medicare IM Given:    Medicare IM give by:    Date Additional Medicare IM Given:    Additional Medicare Important Message give by:     If discussed at [place] of Stay Meetings, dates discussed:    Additional Comments:  Beverly Sessions, RN 08/24/2015, 2:32 PM

## 2015-08-24 NOTE — Care Management (Signed)
Patient not in the room at this time.  Will assess at a later time

## 2015-08-24 NOTE — Consult Note (Signed)
Cardiology Consultation Note  Patient ID: Lindsay [name], MRN: [id number], DOB/AGE: [birth year] 59 y.o. Admit date: 08/23/2015   Date of Consult: 08/24/2015 Primary Physician: Et[facility] Sjogren, Dr. Noe Gens, MD Primary Cardiologist: [facility], Dr. Rodena Piety, MD  Chief Complaint: Recurrent syncope Reason for Consult: Syncope with PAF  HPI: 59 y.o. female with h/o presumed NICM/chronic systolic CHF with prior EF 30-35% in 2011 improved to 45% in 2014, PAF not on long term full dose long term anticoagulation, history of breast cancer s/p chemotherapy (Docetaxol, cytoxan, adriamycin, and lumpectomy) in 4536 complicated by chronic neuropathy, postural hypotension, orthostatic syncope, sensorimortor neuropathy, history of acute renal injury, depression, chronic pain disorder, s/p IVC filter in 07/2013, chronic anemia, seizure disorder, and s/p gastric bypass, history of C diff infection who presented to [facility] on 10/16 with multiple episodes of recurrent syncope x 3 and was found to be in Afib with RVR and have orthostatic hypotension.    Patient was previously seen by Dr. Rockey Situ in 2013 for follow up of prior admissions to the hospital 2/2 Afib with RVR and syncope in December 2012 in which she was treated with Cardizem gtt and flecainide, converting to sinus rhythm. She was noted to have low blood pressure and given IV fluids. Her ACEi was held and she was started on Florinef at that time. Echo showed EF 40-45%, mild DD, normal RVSP. This was an improvement from study done in 2011 that showed an EF of 30-35%. In 2010 MUGA noted preserved LV function of 73%. It was felt her cardiomyopathy was 2/2 previous chemotherapy. She was continued on Florinef and followed up with her PCP for the past 3 years.   She was admitted to [facility] in 2014 for intractable nausea, toxic encephalopathy and seen by GI and neuro. She was found to have orthostatic hypotension. EEG was normal. She was admitted again in 12/2014 for syncope felt to be 2/2  autonomic dysfunction. She had recently been taken off her midodrine and florinef. She was restarted on them and did quite well.   She recently saw Dr. Rodena Piety, MD at [facility] at the request of her PCP given dull pressure along her chest occuring once per week at rest. She was scheduled for a nuclear stress test on 10/18.   She presented to [facility] on 10/16 after suffering 3 syncopal episodes while getting up to go to the door to let her nurses aid in. She initially got up around 10 AM when her phone rang to let her nurses aid in. She noticed at that time she had some tachy-palpitations. Upon standing she suffered a syncopal episode. She is not sure how long she was out. She eventually came to, got up, and attempted to go to the door again, though she suffered another syncopal episode. This time she did not feel any tachy-palpitations. Upon coming to she felt tachy-palpitations. When she stood and got to the door she passed out again, feel tachy-palpitations. A neighbor came out 2/2 hearing her dog barking, let in the nurses aid, and got her up to her room into her bed. She then had an uneventful morning without any further episodes. There was never any associated chest pain. There was some associated SOB.   Upon her arrival to [facility] she was noted to be in Afib with RVR with heart rate in the 120's. BP was soft in the 90's/60's. She did suffer another syncopal episode in the ED while in the bathroom with the nurse. Head CT was negative. Troponin negative x 4. SCr  1.01, K+ 4.0, HGB 10.2, WBC 8.4, CXR no active cardiopulmonary disease. This morning she had another syncopal episode after using the [facility] prior to going down to EEG. Vitals stable. On telemetry she has been in Afib with RVR in the 120's to 160's. She is currently in the 1-teens after receiving digoxin load.      Past Medical History  Diagnosis Date  . HX: breast cancer     right  . Peripheral neuropathy ([place])   . Orthostatic hypotension   .  Depression   . Vitamin D deficiency   . Anemia   . Depression   . Chronic systolic CHF (congestive heart failure) ([place])   . Muscle spasm   . Chronic cough   . C. difficile diarrhea 10/2011  . Kidney stones 2013  . Cancer ([place])   . PAF (paroxysmal atrial fibrillation) ([place])       Most Recent Cardiac Studies: As above   Surgical History:  Past Surgical History  Procedure Laterality Date  . Breast lumpectomy      right @ [facility]  . Lithotripsy  jan 2013  . Esophagogastroduodenoscopy (egd) with propofol N/A 05/25/2015    Procedure: ESOPHAGOGASTRODUODENOSCOPY (EGD) WITH PROPOFOL;  Surgeon: Manya Silvas, MD;  Location: [place];  Service: Endoscopy;  Laterality: N/A;     Home Meds: Prior to Admission medications   Medication Sig Start Date End Date Taking? Authorizing Provider  anastrozole (ARIMIDEX) 1 MG tablet Take 1 mg by mouth daily.     Yes Historical Provider, MD  aspirin EC 81 MG tablet Take 81 mg by mouth daily.   Yes Historical Provider, MD  Cholecalciferol 10000 UNITS CAPS Take 1,000 Units by mouth daily.   Yes Historical Provider, MD  clonazePAM (KLONOPIN) 0.5 MG tablet Take 0.5 mg by mouth at bedtime.    Yes Historical Provider, MD  ferrous sulfate 325 (65 FE) MG tablet Take 325 mg by mouth daily with breakfast.     Yes Historical Provider, MD  fludrocortisone (FLORINEF) 0.1 MG tablet Take 0.2 mg by mouth daily.   Yes Historical Provider, MD  fluticasone (FLONASE) 50 MCG/ACT nasal spray Place 1 spray into the nose daily.    Yes Historical Provider, MD  lidocaine (LIDODERM) 5 % Place 1 patch onto the skin daily. Remove & Discard patch within 12 hours or as directed by MD   Yes Historical Provider, MD  loperamide (IMODIUM) 2 MG capsule Take 2 mg by mouth 4 (four) times daily as needed for diarrhea or loose stools.   Yes Historical Provider, MD  loratadine (CLARITIN) 10 MG tablet Take 10 mg by mouth daily as needed for allergies.    Yes Historical Provider, MD    midodrine (PROAMATINE) 5 MG tablet Take 5 mg by mouth 3 (three) times daily with meals.   Yes Historical Provider, MD  mirtazapine (REMERON) 15 MG tablet Take 15 mg by mouth at bedtime.     Yes Historical Provider, MD  morphine (MS CONTIN) 15 MG 12 hr tablet Take 15 mg by mouth every 8 (eight) hours.   Yes Historical Provider, MD  omeprazole (PRILOSEC) 40 MG capsule Take 40 mg by mouth daily.   Yes Historical Provider, MD  oxyCODONE (ROXICODONE) 15 MG immediate release tablet Take 15 mg by mouth every 6 (six) hours as needed for pain.   Yes Historical Provider, MD  potassium chloride (K-DUR) 10 MEQ tablet Take 20 mEq by mouth 2 (two) times daily.   Yes Historical Provider, MD  pregabalin (LYRICA) 150 MG capsule Take 150 mg by mouth 3 (three) times daily.   Yes Historical Provider, MD  sertraline (ZOLOFT) 50 MG tablet Take 50 mg by mouth daily.   Yes Historical Provider, MD  spironolactone (ALDACTONE) 25 MG tablet Take 25 mg by mouth daily.   Yes Historical Provider, MD  tamsulosin (FLOMAX) 0.4 MG CAPS capsule Take 1 capsule (0.4 mg total) by mouth daily. 05/25/15  Yes Henreitta Leber, MD  tiZANidine (ZANAFLEX) 2 MG tablet Take 2 mg by mouth 3 (three) times daily.   Yes Historical Provider, MD  vitamin B-12 (CYANOCOBALAMIN) 1000 MCG tablet Take 1,000 mcg by mouth daily.    Yes Historical Provider, MD  vitamin C (ASCORBIC ACID) 500 MG tablet Take 500 mg by mouth daily.   Yes Historical Provider, MD    Inpatient Medications:  . anastrozole  1 mg Oral Daily  . aspirin EC  81 mg Oral Daily  . cholecalciferol  1,000 Units Oral Daily  . digoxin  0.5 mg Intravenous Once  . [START ON 08/25/2015] digoxin  0.125 mg Oral Daily  . enoxaparin (LOVENOX) injection  40 mg Subcutaneous Q24H  . ferrous sulfate  325 mg Oral Q breakfast  . fludrocortisone  0.2 mg Oral Daily  . fluticasone  1 spray Each Nare Daily  . lidocaine  1 patch Transdermal Q24H  . midodrine  5 mg Oral TID WC  . mirtazapine  15 mg Oral  QHS  . morphine  15 mg Oral 3 times per day  . pantoprazole  40 mg Oral Daily  . potassium chloride  20 mEq Oral BID  . pregabalin  150 mg Oral TID  . sertraline  50 mg Oral Daily  . spironolactone  25 mg Oral Daily  . tamsulosin  0.4 mg Oral Daily  . tiZANidine  2 mg Oral TID  . vitamin B-12  1,000 mcg Oral Daily  . vitamin C  500 mg Oral Daily      Allergies: No Known Allergies  Social History   Social History  . Marital Status: Widowed    Spouse Name: N/A  . Number of Children: N/A  . Years of Education: N/A   Occupational History  . Not on file.   Social History Main Topics  . Smoking status: Former Smoker -- 0.25 packs/day for 1 years    Types: Cigarettes    Quit date: 11/10/1985  . Smokeless tobacco: Not on file  . Alcohol Use: No  . Drug Use: No  . Sexual Activity: Not on file   Other Topics Concern  . Not on file   Social History Narrative     Family History  Problem Relation Age of Onset  . Heart attack Father      Review of Systems: Review of Systems  Constitutional: Positive for weight loss and malaise/fatigue. Negative for fever, chills and diaphoresis.  HENT: Negative for congestion.   Eyes: Negative for discharge and redness.  Respiratory: Positive for shortness of breath. Negative for cough, hemoptysis, sputum production and wheezing.   Cardiovascular: Positive for palpitations. Negative for chest pain, orthopnea, claudication, leg swelling and PND.  Gastrointestinal: Negative for heartburn, nausea, vomiting, blood in stool and melena.  Genitourinary: Negative for hematuria.  Musculoskeletal: Negative for myalgias, back pain, joint pain, falls and neck pain.  Skin: Negative for rash.  Neurological: Positive for dizziness, tingling, sensory change, loss of consciousness and weakness. Negative for tremors, speech change, focal weakness and seizures.  Endo/Heme/Allergies: Does  not bruise/bleed easily.  Psychiatric/Behavioral: Negative for  depression and substance abuse. The patient is nervous/anxious.   All other systems reviewed and are negative.    Labs:  Recent Labs  08/23/15 1355 08/23/15 1753 08/23/15 2235 08/24/15 0425  TROPONINI <0.03 <0.03 <0.03 <0.03   Lab Results  Component Value Date   WBC 8.4 08/23/2015   HGB 10.2* 08/23/2015   HCT 31.5* 08/23/2015   MCV 86.9 08/23/2015   PLT 172 08/23/2015     Recent Labs Lab 08/23/15 1355  NA 140  K 4.0  CL 113*  CO2 24  BUN 16  CREATININE 1.01*  CALCIUM 8.9  GLUCOSE 85   No results found for: CHOL, HDL, LDLCALC, TRIG No results found for: DDIMER  Radiology/Studies:  Dg Chest 2 View  08/23/2015  CLINICAL DATA:  Chest pain EXAM: CHEST  2 VIEW COMPARISON:  05/23/2015 FINDINGS: Cardiomediastinal silhouette is stable. Surgical clips are noted in right axilla. No acute infiltrate or pleural effusion. No pulmonary edema. Mild degenerative changes thoracic spine. IMPRESSION: No active cardiopulmonary disease. Electronically Signed   By: Lahoma Crocker M.D.   On: 08/23/2015 14:26   Ct Head Wo Contrast  08/23/2015  CLINICAL DATA:  Syncope EXAM: CT HEAD WITHOUT CONTRAST TECHNIQUE: Contiguous axial images were obtained from the base of the skull through the vertex without intravenous contrast. COMPARISON:  01/12/2015 FINDINGS: Mild global atrophy. Minimal chronic ischemic changes in the periventricular white matter. No mass effect, midline shift, or acute hemorrhage. Mastoid air cells are clear. Cranium is intact. IMPRESSION: No acute intracranial pathology. Electronically Signed   By: Marybelle Killings M.D.   On: 08/23/2015 14:41    EKG: Afib, 87 bpm, nonspecific st/t changes   Weights: Filed Weights   08/23/15 1326  Weight: 119 lb (53.978 kg)     Physical Exam: Blood pressure 86/63, pulse 75, temperature 98.6 F (37 C), temperature source Oral, resp. rate 18, height 5\' 6"  (1.676 m), weight 119 lb (53.978 kg), SpO2 100 %. Body mass index is 19.22  kg/(m^2). General: Well developed, well nourished, in no acute distress. Head: Normocephalic, atraumatic, sclera non-icteric, no xanthomas, nares are without discharge.  Neck: Negative for carotid bruits. JVD not elevated. Lungs: Clear bilaterally to auscultation without wheezes, rales, or rhonchi. Breathing is unlabored. Heart: Irregularly-irregular, with S1 S2. No murmurs, rubs, or gallops appreciated. Abdomen: Soft, non-tender, non-distended with normoactive bowel sounds. No hepatomegaly. No rebound/guarding. No obvious abdominal masses. Msk:  Strength and tone appear normal for age. Extremities: No clubbing or cyanosis. No edema.  Distal pedal pulses are 2+ and equal bilaterally. Neuro: Alert and oriented X 3. No facial asymmetry. No focal deficit. Moves all extremities spontaneously. Psych:  Responds to questions appropriately with a normal affect.    Assessment and Plan:  59 y.o. female with h/o presumed NICM/chronic systolic CHF with prior EF 30-35% in 2011 improved to 45% in 2014, PAF not on long term full dose long term anticoagulation, history of breast cancer s/p chemotherapy (Docetaxol, cytoxan, adriamycin, and lumpectomy) in 0786 complicated by chronic neuropathy, postural hypotension, orthostatic syncope, sensorimortor neuropathy, history of acute renal injury, depression, chronic pain disorder, s/p IVC filter in 07/2013, chronic anemia, seizure disorder, and history of C diff infection who presented to [facility] on 10/16 with multiple episodes of recurrent syncope x 3 and was found to be in Afib with RVR and have orthostatic hypotension.  1. Recurrent syncope: -She has done well in the past while on Florinef and had recurrence of symptoms  when this has been held. Though this episode was not preceded by cessation of her Florinef -Patient felt tachy-palpitations prior to 2 of 3 syncopal episodes this time. This is new for her -Continue to monitor for possible tachy-brady syndrome -If  nothing is seen on inpatient telemetry would recommend outpatient cardiac monitoring with primary cardiologist, if they deem necessary  -Continue Florinef -Neurology consulted  -Echo  2. PAF with RVR: -Currently rate controlled -Gave IV digoxin 0.5 mg x 1 dose for rate control as her soft BP precludes usage of BB or CCB -Scheduled digoxin 0.125 mg for 10/18  -If stable LV function could continue Flecainide in an effort to convert to sinus rhythm (presumed NICM) -Not currently on long term full dose anticoagulation -CHADSVASc at least 2 (CHF, female), giving her an estimated annual stroke risk of 2.2%  3. Presumed NICM: -She was scheduled for [facility] on 10/18 through [facility] given some chest pain symptoms -Given her recurrent syncopal episode inpatient stress testing would be of benefit prior to her follow up with Dr. Rodena Piety, MD  4. Chronic systolic CHF: -Check echo to evaluate LV function -Soft BP precludes BB, ACEi, or spironolactone at this time  5. Orthostatic hypotension: -Continue Florinef  6.   Melvern Banker, PA-C Pager: [phone] 08/24/2015, 11:16 AM

## 2015-08-24 NOTE — Progress Notes (Signed)
Pt requested orthostatic vital signs be done. Requested was made on day shift per report and then again to night shift.  aide. Pt unable to tolerate orthostatic vital signs. Pt had syncopal episode was she stood up. Became unresponsive and legs stiffened. Regained consciousness after a few minutes. Complained of chest pain shortly after. Declined any further actions.  Pt otherwise stable and no other concerns at this time. Will continue to monitor.

## 2015-08-24 NOTE — Progress Notes (Signed)
Lindsay [name] NAME: Lindsay [name]    MR#:  [id number]  DATE OF BIRTH:  03-07-1956  SUBJECTIVE:  Patient had another episode of presyncope. Her heart rate is noted to be elevated and also to have orthostasis. She just came back from EEG.  REVIEW OF SYSTEMS:    Review of Systems  Constitutional: Negative for fever, chills and malaise/fatigue.  HENT: Negative for sore throat.   Eyes: Negative for blurred vision.  Respiratory: Negative for cough, hemoptysis, shortness of breath and wheezing.   Cardiovascular: Positive for palpitations. Negative for chest pain, orthopnea, claudication, leg swelling and PND.  Gastrointestinal: Negative for nausea, vomiting, abdominal pain, diarrhea and blood in stool.  Genitourinary: Negative for dysuria.  Musculoskeletal: Negative for back pain.  Neurological: Negative for dizziness, tremors and headaches.  Endo/Heme/Allergies: Does not bruise/bleed easily.    Tolerating Diet:yes      DRUG ALLERGIES:  No Known Allergies  VITALS:  Blood pressure 86/63, pulse 75, temperature 98.6 F (37 C), temperature source Oral, resp. rate 18, height 5\' 6"  (1.676 m), weight 53.978 kg (119 lb), SpO2 100 %.  PHYSICAL EXAMINATION:   Physical Exam  Constitutional: She is oriented to person, place, and time and well-developed, well-nourished, and in no distress. No distress.  HENT:  Head: Normocephalic.  Eyes: No scleral icterus.  Neck: Normal range of motion. Neck supple. No JVD present. No tracheal deviation present.  Cardiovascular: Normal rate.  Exam reveals no gallop and no friction rub.   No murmur heard. Irregular, irregular  Pulmonary/Chest: Effort normal and breath sounds normal. No respiratory distress. She has no wheezes. She has no rales. She exhibits no tenderness.  Abdominal: Soft. Bowel sounds are normal. She exhibits no distension and no mass. There is no tenderness. There is no rebound  and no guarding.  Musculoskeletal: Normal range of motion. She exhibits no edema.  Neurological: She is alert and oriented to person, place, and time.  Skin: Skin is warm. No rash noted. No erythema.  Psychiatric: Affect and judgment normal.      LABORATORY PANEL:   CBC  Recent Labs Lab 08/23/15 1355  WBC 8.4  HGB 10.2*  HCT 31.5*  PLT 172   ------------------------------------------------------------------------------------------------------------------  Chemistries   Recent Labs Lab 08/23/15 1355  NA 140  K 4.0  CL 113*  CO2 24  GLUCOSE 85  BUN 16  CREATININE 1.01*  CALCIUM 8.9   ------------------------------------------------------------------------------------------------------------------  Cardiac Enzymes  Recent Labs Lab 08/23/15 1753 08/23/15 2235 08/24/15 0425  TROPONINI <0.03 <0.03 <0.03   ------------------------------------------------------------------------------------------------------------------  RADIOLOGY:  Dg Chest 2 View  08/23/2015  CLINICAL DATA:  Chest pain EXAM: CHEST  2 VIEW COMPARISON:  05/23/2015 FINDINGS: Cardiomediastinal silhouette is stable. Surgical clips are noted in right axilla. No acute infiltrate or pleural effusion. No pulmonary edema. Mild degenerative changes thoracic spine. IMPRESSION: No active cardiopulmonary disease. Electronically Signed   By: Lahoma Crocker M.D.   On: 08/23/2015 14:26   Ct Head Wo Contrast  08/23/2015  CLINICAL DATA:  Syncope EXAM: CT HEAD WITHOUT CONTRAST TECHNIQUE: Contiguous axial images were obtained from the base of the skull through the vertex without intravenous contrast. COMPARISON:  01/12/2015 FINDINGS: Mild global atrophy. Minimal chronic ischemic changes in the periventricular white matter. No mass effect, midline shift, or acute hemorrhage. Mastoid air cells are clear. Cranium is intact. IMPRESSION: No acute intracranial pathology. Electronically Signed   By: Marybelle Killings M.D.   On:  08/23/2015  14:41     ASSESSMENT AND PLAN:   This is a 59 year old female with nonischemic cardiomyopathy EF 45%, PAF who presented with multiple episodes of recurrent syncope and A. fib and RVR.  1. Syncope: This is thought to be secondary to orthostasis. I'm also suspicious that this may be tachycardia, bradycardia syndrome. I have consulted cardiology for further evaluation. Patient would benefit from a monitor at discharge. Patient does have an outpatient primary care cardiologist for a stress test. Patient should continue Florinef. Neurology consultation was also placed. I ordered an EEG  this morning.  2. PAF with RVR: Cardiology consultation for further evaluation and management. Patient may benefit from digoxin. Due to her low blood pressure and orthostasis she may not be able to tolerate Coreg. Cardiology also to assist with anticoagulation.     3. History of breast cancer: Continue arimedex  4. Depression: Continue Remeron     Management plans discussed with the patient and she is in agreement.  CODE STATUS: FULL  TOTAL TIME TAKING CARE OF THIS PATIENT: 30 minutes.     POSSIBLE D/C 1-2 days, DEPENDING ON CLINICAL CONDITION.   Kimmy Parish M.D on 08/24/2015 at 11:41 AM  Between 7am to 6pm - Pager - [phone] After 6pm go to [email or web address] - password [email or web address] [place]  Office  [phone]  CC: Primary care physician; Pcp Not In System  Note: This dictation was prepared with Dragon dictation along with smaller phrase technology. Any transcriptional errors that result from this process are unintentional.

## 2015-08-24 NOTE — Progress Notes (Signed)
Responded to code at 3:55 a.m..  Dr. Asked if I would sit with her for a while, because she has no family.  When responsive after her episode she was afraid and fearful.  I gave prescience, pastoral care and prayer.  When I left at 6:30 a.m. patient was alert and carrying on a conversation with the nurse and me.   [place]

## 2015-08-24 NOTE — Progress Notes (Signed)
Around 1610 pt was c/o abdominal pain. Pt stated that she had not been to the bathroom to urinate since she got to the ED. Pt unable to void in the bedpan, she stated "I can't do it," bladder scanned pt and pt had 881 ml of urine. We attempted bedpan again but pt unable to void and requesting to go to the bathroom. Pt walked to the bathroom and voided with no problem. When going back to bed, pt stated to NT that she was not feeling good. NT helped her to the bed and called for help. Pt's HR increased to the 160's per Network engineer. Rapid response team was called for help. On assessment pt was slow to response, responded at times and return to unresponsive stage, episodes varying  from seconds to minutes. Vitals were stable. Pt c/o of jaw and chest pain. One sublingual nitroglycerin  given for chest pain, pt refused a second one. Pt is back to normal, had her schedule med. This morning with no problem. Will continue to monitor.

## 2015-08-24 NOTE — Progress Notes (Signed)
Patient HR in 170's and 180's, afib. Patient asymptomatic, Cardio consulted and on floor. [place], [place] notified and saw patient and digoxin ordered. Digoxin given at this time, will cont to assess. Wilnette Kales

## 2015-08-25 ENCOUNTER — Inpatient Hospital Stay (HOSPITAL_COMMUNITY): Payer: Medicare Other

## 2015-08-25 ENCOUNTER — Encounter: Payer: Self-pay | Admitting: Radiology

## 2015-08-25 DIAGNOSIS — R079 Chest pain, unspecified: Secondary | ICD-10-CM

## 2015-08-25 DIAGNOSIS — I951 Orthostatic hypotension: Principal | ICD-10-CM

## 2015-08-25 DIAGNOSIS — I480 Paroxysmal atrial fibrillation: Secondary | ICD-10-CM | POA: Insufficient documentation

## 2015-08-25 LAB — NM MYOCAR MULTI W/SPECT W/WALL MOTION / EF
CHL CUP NUCLEAR SSS: 0
CHL CUP STRESS STAGE 1 HR: 118 {beats}/min
CHL CUP STRESS STAGE 2 SPEED: 0 mph
CHL CUP STRESS STAGE 3 HR: 106 {beats}/min
CHL CUP STRESS STAGE 3 SPEED: 0 mph
CHL CUP STRESS STAGE 4 GRADE: 0 %
CHL CUP STRESS STAGE 4 HR: 126 {beats}/min
CHL CUP STRESS STAGE 5 SPEED: 0 mph
CHL CUP STRESS STAGE 6 GRADE: 0 %
CHL CUP STRESS STAGE 6 HR: 105 {beats}/min
CHL CUP STRESS STAGE 6 SPEED: 0 mph
CSEPHR: 83 %
CSEPPMHR: 77 %
Estimated workload: 1 METS
LV dias vol: 47 mL
LVSYSVOL: 18 mL
NUC STRESS TID: 1.17
Peak HR: 126 {beats}/min
Rest HR: 109 {beats}/min
SDS: 0
SRS: 0
Stage 2 Grade: 0 %
Stage 2 HR: 107 {beats}/min
Stage 3 Grade: 0 %
Stage 4 Speed: 0 mph
Stage 5 Grade: 0 %
Stage 5 HR: 130 {beats}/min
Stage 6 DBP: 61 mmHg
Stage 6 SBP: 97 mmHg

## 2015-08-25 LAB — URINALYSIS COMPLETE WITH MICROSCOPIC (ARMC ONLY)
Bilirubin Urine: NEGATIVE
Glucose, UA: NEGATIVE mg/dL
Hgb urine dipstick: NEGATIVE
KETONES UR: NEGATIVE mg/dL
NITRITE: POSITIVE — AB
PH: 5 (ref 5.0–8.0)
PROTEIN: NEGATIVE mg/dL
Specific Gravity, Urine: 1.017 (ref 1.005–1.030)

## 2015-08-25 MED ORDER — FLECAINIDE ACETATE 50 MG PO TABS
50.0000 mg | ORAL_TABLET | Freq: Two times a day (BID) | ORAL | Status: DC
  Administered 2015-08-25: 50 mg via ORAL
  Filled 2015-08-25 (×3): qty 1

## 2015-08-25 MED ORDER — TECHNETIUM TC 99M SESTAMIBI - CARDIOLITE
13.0000 | Freq: Once | INTRAVENOUS | Status: AC | PRN
  Administered 2015-08-25: 13.3 via INTRAVENOUS

## 2015-08-25 MED ORDER — TECHNETIUM TC 99M SESTAMIBI GENERIC - CARDIOLITE
30.0000 | Freq: Once | INTRAVENOUS | Status: AC | PRN
  Administered 2015-08-25: 31.78 via INTRAVENOUS

## 2015-08-25 MED ORDER — CEFUROXIME AXETIL 250 MG PO TABS
250.0000 mg | ORAL_TABLET | Freq: Two times a day (BID) | ORAL | Status: DC
  Administered 2015-08-26 – 2015-08-27 (×3): 250 mg via ORAL
  Filled 2015-08-25 (×3): qty 1

## 2015-08-25 MED ORDER — REGADENOSON 0.4 MG/5ML IV SOLN
0.4000 mg | Freq: Once | INTRAVENOUS | Status: AC
  Administered 2015-08-25: 0.4 mg via INTRAVENOUS

## 2015-08-25 MED ORDER — MIDODRINE HCL 5 MG PO TABS
10.0000 mg | ORAL_TABLET | Freq: Three times a day (TID) | ORAL | Status: DC
  Administered 2015-08-26 – 2015-08-27 (×5): 10 mg via ORAL
  Filled 2015-08-25 (×5): qty 2

## 2015-08-25 NOTE — Progress Notes (Signed)
[facility] NAME: Lindsay [name]    MR#:  [id number]  DATE OF BIRTH:  15-Mar-1956  SUBJECTIVE:  Patient just came back from her stress test. She is reporting not feeling well. REVIEW OF SYSTEMS:    Review of Systems  Constitutional: Positive for fever and chills. Negative for malaise/fatigue.  HENT: Negative for sore throat.   Eyes: Negative for blurred vision.  Respiratory: Negative for cough, hemoptysis, shortness of breath and wheezing.   Cardiovascular: Positive for palpitations. Negative for chest pain, orthopnea, claudication, leg swelling and PND.  Gastrointestinal: Negative for nausea, vomiting, abdominal pain, diarrhea and blood in stool.  Genitourinary: Positive for dysuria and urgency.  Musculoskeletal: Negative for back pain.  Neurological: Negative for dizziness, tremors and headaches.  Endo/Heme/Allergies: Does not bruise/bleed easily.    Tolerating Diet:yes      DRUG ALLERGIES:  No Known Allergies  VITALS:  Blood pressure 116/74, pulse 104, temperature 100.1 F (37.8 C), temperature source Oral, resp. rate 26, height 5\' 6"  (1.676 m), weight 53.978 kg (119 lb), SpO2 100 %.  PHYSICAL EXAMINATION:   Physical Exam  Constitutional: She is oriented to person, place, and time and well-developed, well-nourished, and in no distress. No distress.  HENT:  Head: Normocephalic.  Eyes: No scleral icterus.  Neck: Normal range of motion. Neck supple. No JVD present. No tracheal deviation present.  Cardiovascular: Normal rate, regular rhythm and normal heart sounds.  Exam reveals no gallop and no friction rub.   No murmur heard. Irregular, irregular  Pulmonary/Chest: Effort normal and breath sounds normal. No respiratory distress. She has no wheezes. She has no rales. She exhibits no tenderness.  Abdominal: Soft. Bowel sounds are normal. She exhibits no distension and no mass. There is no tenderness. There is no  rebound and no guarding.  Musculoskeletal: Normal range of motion. She exhibits no edema.  Neurological: She is alert and oriented to person, place, and time.  Skin: Skin is warm. No rash noted. No erythema.  Psychiatric: Affect and judgment normal.      LABORATORY PANEL:   CBC  Recent Labs Lab 08/23/15 1355  WBC 8.4  HGB 10.2*  HCT 31.5*  PLT 172   ------------------------------------------------------------------------------------------------------------------  Chemistries   Recent Labs Lab 08/23/15 1355  NA 140  K 4.0  CL 113*  CO2 24  GLUCOSE 85  BUN 16  CREATININE 1.01*  CALCIUM 8.9   ------------------------------------------------------------------------------------------------------------------  Cardiac Enzymes  Recent Labs Lab 08/23/15 1753 08/23/15 2235 08/24/15 0425  TROPONINI <0.03 <0.03 <0.03   ------------------------------------------------------------------------------------------------------------------  RADIOLOGY:  Dg Chest 2 View  08/23/2015  CLINICAL DATA:  Chest pain EXAM: CHEST  2 VIEW COMPARISON:  05/23/2015 FINDINGS: Cardiomediastinal silhouette is stable. Surgical clips are noted in right axilla. No acute infiltrate or pleural effusion. No pulmonary edema. Mild degenerative changes thoracic spine. IMPRESSION: No active cardiopulmonary disease. Electronically Signed   By: Lahoma Crocker M.D.   On: 08/23/2015 14:26   Ct Head Wo Contrast  08/23/2015  CLINICAL DATA:  Syncope EXAM: CT HEAD WITHOUT CONTRAST TECHNIQUE: Contiguous axial images were obtained from the base of the skull through the vertex without intravenous contrast. COMPARISON:  01/12/2015 FINDINGS: Mild global atrophy. Minimal chronic ischemic changes in the periventricular white matter. No mass effect, midline shift, or acute hemorrhage. Mastoid air cells are clear. Cranium is intact. IMPRESSION: No acute intracranial pathology. Electronically Signed   By: Marybelle Killings M.D.   On:  08/23/2015 14:41  US Carotid Bilateral  08/24/2015  CLINICAL DATA:  Syncope, stroke symptoms EXAM: BILATERAL CAROTID DUPLEX ULTRASOUND TECHNIQUE: Pearline Cables scale imaging, color Doppler and duplex ultrasound were performed of bilateral carotid and vertebral arteries in the neck. COMPARISON:  None. FINDINGS: Criteria: Quantification of carotid stenosis is based on velocity parameters that correlate the residual internal carotid diameter with NASCET-based stenosis levels, using the diameter of the distal internal carotid lumen as the denominator for stenosis measurement. The following velocity measurements were obtained: RIGHT ICA:  50/20 cm/sec CCA:  74/12 cm/sec SYSTOLIC ICA/CCA RATIO:  0.7 DIASTOLIC ICA/CCA RATIO:  0.8 ECA:  53 cm/sec LEFT ICA:  55/26 cm/sec CCA:  87/86 cm/sec SYSTOLIC ICA/CCA RATIO:  0.8 DIASTOLIC ICA/CCA RATIO:  1.2 ECA:  53 cm/sec RIGHT CAROTID ARTERY: Minor echogenic shadowing plaque formation. No hemodynamically significant right ICA stenosis, velocity elevation, or turbulent flow. Degree of narrowing less than 50%. RIGHT VERTEBRAL ARTERY:  Antegrade LEFT CAROTID ARTERY: Similar scattered minor echogenic plaque formation. No hemodynamically significant left ICA stenosis, velocity elevation, or turbulent flow. LEFT VERTEBRAL ARTERY:  Antegrade IMPRESSION: Minor carotid atherosclerosis. No hemodynamically significant ICA stenosis. Degree of narrowing less than 50% bilaterally. Electronically Signed   By: Jerilynn Mages.  Shick M.D.   On: 08/24/2015 14:49     ASSESSMENT AND PLAN:   This is a 59 year old female with nonischemic cardiomyopathy EF 45%, PAF who presented with multiple episodes of recurrent syncope and A. fib and RVR.  1. Syncope: Patient will continue Florinef. She is currently orthostatic. Patient may have underlying tachycardia-bradycardia syndrome. Stress test pending this morning. EEG and carotid Dopplers were normal. Appreciate cardio allergy consultation. Patient will need  outpatient cardiac monitoring.  2. PAF with RVR: Patient is now in normal sinus rhythm. Continue low-dose digoxin. Patient is currently not on long term full anticoagulation. At this time there are no recommendations by our cardiologist for anticoagulation.   3. History of breast cancer: Continue arimedex  4. Depression: Continue Remeron   5. Chronic systolic heart failure: Patient does not appear to be in exacerbation at this time.  6. Fever: I will check UA as patient is also complaining of dysuria. I'll also check blood cultures 2.  Management plans discussed with the patient and she is in agreement.  CODE STATUS: FULL  TOTAL TIME TAKING CARE OF THIS PATIENT: 28 minutes.     POSSIBLE D/C 1-2 days, DEPENDING ON CLINICAL CONDITION.   Siomara Burkel M.D on 08/25/2015 at 12:29 PM  Between 7am to 6pm - Pager - [phone] After 6pm go to [email or web address] - password [email or web address] [facility]  Office  [phone]  CC: Primary care physician; Pcp Not In System  Note: This dictation was prepared with Dragon dictation along with smaller phrase technology. Any transcriptional errors that result from this process are unintentional.

## 2015-08-25 NOTE — Progress Notes (Signed)
Physical Therapy Treatment Patient Details Name: Lindsay [name] MRN: [id number] DOB: May 09, 1956 Today's Date: 08/25/2015    History of Present Illness Pt is a 59 y.o. female presenting to hospital with syncopal episode at home; pt then with syncopal episode in ED and also another syncopal episode upon admission.  Pt admitted with syncope, a-fib, RVR, and orthostatic hypotension.  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    PT Comments    Pt able to participate in bed exercises this afternoon, which she was pleased to complete and progress through. Nurse cleared pt to perform transfer into standing. Blood pressure in sitting was 84/56 while blood pressure in standing was 71/42. Pt was non-symptomatic in standing, however ambulation was contraindicated at this blood pressure. Pt expressed disappointment that it was so low. Nurse notified of readings. PT is not able to get an accurate assessment of mobility at this time due to blood pressure, however she has fair strength currently to make it seem as though she will be safe to go home. We will continue to re-assess for ambulation. Due to her mobility deficits she will continue to benefit from skilled PT in order for her to return home safely.   Follow Up Recommendations  Home health PT (Pending resolve of medical issues affecting ambulation)     Equipment Recommendations  None recommended by PT    Recommendations for Other Services       Precautions / Restrictions Precautions Precautions: Fall Precaution Comments: Orthostatic hypotension Restrictions Weight Bearing Restrictions: No    Mobility  Bed Mobility Overal bed mobility: Modified Independent             General bed mobility comments: Only needs side rails to get to EOB.   Transfers Overall transfer level: Modified independent Equipment used:  Rolling walker (2 wheeled) (for safety)             General transfer comment: Pt transfers with good functional strength getting into standing and lowering into sitting. Good stability once in standing  Ambulation/Gait Ambulation/Gait assistance:  (Not safe to assess)               Stairs            Wheelchair Mobility    Modified Rankin (Stroke Patients Only)       Balance Overall balance assessment: History of Falls                                  Cognition Arousal/Alertness: Awake/alert Behavior During Therapy: WFL for tasks assessed/performed Overall Cognitive Status: Within Functional Limits for tasks assessed                      Exercises Other Exercises Other Exercises: Pt performed bilateral therex x 12 bilaterally at supervision for proper technique. Exercises included: ankle pumps, SLR, hip abd, pillow squeeze, glute squeezes, LAQ, and hip marching.     General Comments        Pertinent Vitals/Pain Pain Assessment: No/denies pain    Home Living                      Prior Function            PT Goals (current goals can now be found in the care plan section) Acute Rehab PT Goals Patient  Stated Goal: to go home PT Goal Formulation: With patient Time For Goal Achievement: 09/07/15 Potential to Achieve Goals: Fair Progress towards PT goals: Progressing toward goals    Frequency  Min 2X/week    PT Plan Current plan remains appropriate    Co-evaluation             End of Session Equipment Utilized During Treatment: Gait belt Activity Tolerance: Patient tolerated treatment well Patient left: in bed;with call bell/phone within reach;with bed alarm set     Time: 5638-9373 PT Time Calculation (min) (ACUTE ONLY): 19 min  Charges:                       G CodesJanyth [name] 09/08/2015, 4:24 PM  Lindsay [name], SPT. [phone]

## 2015-08-25 NOTE — Progress Notes (Signed)
Patient: Lindsay [name] / Admit Date: 08/23/2015 / Date of Encounter: 08/25/2015, 9:34 AM   Subjective: Feeling "a little bit better" today. She is for [employer] today. EEG normal. Carotid doppler normal. She converted to sinus rhythm on 10/17 and has remained in sinus.    Review of Systems: Review of Systems  Constitutional: Positive for weight loss and malaise/fatigue. Negative for fever, chills and diaphoresis.  HENT: Positive for congestion.   Eyes: Negative for discharge and redness.  Respiratory: Negative for cough, hemoptysis, sputum production, shortness of breath and wheezing.   Cardiovascular: Negative for chest pain, palpitations, orthopnea, claudication, leg swelling and PND.  Gastrointestinal: Negative for nausea and vomiting.  Skin: Negative for rash.  Neurological: Positive for weakness.  Psychiatric/Behavioral: The patient is nervous/anxious.     Objective: Telemetry: NSR Physical Exam: Blood pressure 148/96, pulse 72, temperature 97.5 F (36.4 C), temperature source Oral, resp. rate 20, height 5\' 6"  (1.676 m), weight 119 lb (53.978 kg), SpO2 98 %. Body mass index is 19.22 kg/(m^2). General: Well developed, well nourished, in no acute distress. Head: Normocephalic, atraumatic, sclera non-icteric, no xanthomas, nares are without discharge. Neck: Negative for carotid bruits. JVP not elevated. Lungs: Clear bilaterally to auscultation without wheezes, rales, or rhonchi. Breathing is unlabored. Heart: RRR S1 S2 without murmurs, rubs, or gallops.  Abdomen: Soft, non-tender, non-distended with normoactive bowel sounds. No rebound/guarding. Extremities: No clubbing or cyanosis. No edema. Distal pedal pulses are 2+ and equal bilaterally. Neuro: Alert and oriented X 3. Moves all extremities spontaneously. Psych:  Responds to questions appropriately with a normal affect.   Intake/Output Summary (Last 24 hours) at 08/25/15 0934 Last data filed at 08/24/15 1851  Gross per 24 hour  Intake    480 ml  Output   1000 ml  Net   -520 ml    Inpatient Medications:  . anastrozole  1 mg Oral Daily  . aspirin EC  81 mg Oral Daily  . cholecalciferol  1,000 Units Oral Daily  . digoxin  0.125 mg Oral Daily  . enoxaparin (LOVENOX) injection  40 mg Subcutaneous Q24H  . ferrous sulfate  325 mg Oral Q breakfast  . fludrocortisone  0.2 mg Oral Daily  . fluticasone  1 spray Each Nare Daily  . lidocaine  1 patch Transdermal Q24H  . midodrine  5 mg Oral TID WC  . mirtazapine  15 mg Oral QHS  . morphine  15 mg Oral TID  . pantoprazole  40 mg Oral Daily  . potassium chloride  20 mEq Oral BID  . pregabalin  150 mg Oral TID  . sertraline  50 mg Oral Daily  . spironolactone  25 mg Oral Daily  . tamsulosin  0.4 mg Oral Daily  . tiZANidine  2 mg Oral TID  . vitamin B-12  1,000 mcg Oral Daily  . vitamin C  500 mg Oral Daily   Infusions:    Labs:  Recent Labs  08/23/15 1355  NA 140  K 4.0  CL 113*  CO2 24  GLUCOSE 85  BUN 16  CREATININE 1.01*  CALCIUM 8.9   No results for input(s): AST, ALT, ALKPHOS, BILITOT, PROT, ALBUMIN in the last 72 hours.  Recent Labs  08/23/15 1355  WBC 8.4  HGB 10.2*  HCT 31.5*  MCV 86.9  PLT 172    Recent Labs  08/23/15 1355 08/23/15 1753 08/23/15 2235 08/24/15 0425  TROPONINI <0.03 <0.03 <0.03 <0.03   Invalid input(s): POCBNP No results for  input(s): HGBA1C in the last 72 hours.   Weights: Filed Weights   08/23/15 1326  Weight: 119 lb (53.978 kg)     Radiology/Studies:  Dg Chest 2 View  08/23/2015  CLINICAL DATA:  Chest pain EXAM: CHEST  2 VIEW COMPARISON:  05/23/2015 FINDINGS: Cardiomediastinal silhouette is stable. Surgical clips are noted in right axilla. No acute infiltrate or pleural effusion. No pulmonary edema. Mild degenerative changes thoracic spine. IMPRESSION: No active cardiopulmonary disease. Electronically Signed   By: Lahoma Crocker M.D.   On: 08/23/2015 14:26   Ct Head Wo  Contrast  08/23/2015  CLINICAL DATA:  Syncope EXAM: CT HEAD WITHOUT CONTRAST TECHNIQUE: Contiguous axial images were obtained from the base of the skull through the vertex without intravenous contrast. COMPARISON:  01/12/2015 FINDINGS: Mild global atrophy. Minimal chronic ischemic changes in the periventricular white matter. No mass effect, midline shift, or acute hemorrhage. Mastoid air cells are clear. Cranium is intact. IMPRESSION: No acute intracranial pathology. Electronically Signed   By: Marybelle Killings M.D.   On: 08/23/2015 14:41   US Carotid Bilateral  08/24/2015  CLINICAL DATA:  Syncope, stroke symptoms EXAM: BILATERAL CAROTID DUPLEX ULTRASOUND TECHNIQUE: Pearline Cables scale imaging, color Doppler and duplex ultrasound were performed of bilateral carotid and vertebral arteries in the neck. COMPARISON:  None. FINDINGS: Criteria: Quantification of carotid stenosis is based on velocity parameters that correlate the residual internal carotid diameter with NASCET-based stenosis levels, using the diameter of the distal internal carotid lumen as the denominator for stenosis measurement. The following velocity measurements were obtained: RIGHT ICA:  50/20 cm/sec CCA:  98/33 cm/sec SYSTOLIC ICA/CCA RATIO:  0.7 DIASTOLIC ICA/CCA RATIO:  0.8 ECA:  53 cm/sec LEFT ICA:  55/26 cm/sec CCA:  82/50 cm/sec SYSTOLIC ICA/CCA RATIO:  0.8 DIASTOLIC ICA/CCA RATIO:  1.2 ECA:  53 cm/sec RIGHT CAROTID ARTERY: Minor echogenic shadowing plaque formation. No hemodynamically significant right ICA stenosis, velocity elevation, or turbulent flow. Degree of narrowing less than 50%. RIGHT VERTEBRAL ARTERY:  Antegrade LEFT CAROTID ARTERY: Similar scattered minor echogenic plaque formation. No hemodynamically significant left ICA stenosis, velocity elevation, or turbulent flow. LEFT VERTEBRAL ARTERY:  Antegrade IMPRESSION: Minor carotid atherosclerosis. No hemodynamically significant ICA stenosis. Degree of narrowing less than 50% bilaterally.  Electronically Signed   By: Jerilynn Mages.  Shick M.D.   On: 08/24/2015 14:49     Assessment and Plan  59 y.o. female with h/o presumed NICM/chronic systolic CHF with prior EF 30-35% in 2011 improved to 45% in 2014, PAF not on long term full dose long term anticoagulation, history of breast cancer s/p chemotherapy (Docetaxol, cytoxan, adriamycin, and lumpectomy) in 5397 complicated by chronic neuropathy, postural hypotension, orthostatic syncope, sensorimortor neuropathy, history of acute renal injury, depression, chronic pain disorder, s/p IVC filter in 07/2013, chronic anemia, seizure disorder, and history of C diff infection who presented to [facility] on 10/16 with multiple episodes of recurrent syncope x 3 and was found to be in Afib with RVR and have orthostatic hypotension.  1. Recurrent syncope: -Feeling better -She has done well in the past while on Florinef and had recurrence of symptoms when this has been held. Though this episode was not preceded by cessation of her Florinef -Patient felt tachy-palpitations prior to 2 of 3 syncopal episodes this time. This is new for her -Continue to monitor for possible tachy-brady syndrome -If nothing is seen on inpatient telemetry would recommend outpatient cardiac monitoring with primary cardiologist, if they deem necessary  -Continue Florinef -Neurology consulted, normal EEG -Normal carotid doppler -  Echo, if abnormal stress test   2. PAF with RVR: -Currently in sinus rhythm as of 10/17 -Continue low dose digoxin 0.125 mg daily -If stable LV function could continue Flecainide in an effort to convert to sinus rhythm (presumed NICM) -Not currently on long term full dose anticoagulation -CHADSVASc at least 1-2 (possible CHF, female)  3. Presumed NICM: -She was scheduled for [facility] on 10/18 through [facility] given some chest pain symptoms -She is for [employer] today  4. Chronic systolic CHF: -Check echo to evaluate LV function, if abnormal stress  test  -Soft BP precludes BB, ACEi, or spironolactone at this time  5. Orthostatic hypotension: -Continue Florinef   Signed, Christell Faith, PA-C Pager: [phone] 08/25/2015, 9:34 AM

## 2015-08-25 NOTE — Progress Notes (Signed)
Spoke with dr. Benjie Karvonen to make aware of temp. Spike as well as orthostatic vital and strong urine. Per md cancel any discharge plan, will order blood cultures and add UA [place]

## 2015-08-26 ENCOUNTER — Inpatient Hospital Stay: Payer: Medicare Other

## 2015-08-26 DIAGNOSIS — F3341 Major depressive disorder, recurrent, in partial remission: Secondary | ICD-10-CM

## 2015-08-26 DIAGNOSIS — R29898 Other symptoms and signs involving the musculoskeletal system: Secondary | ICD-10-CM | POA: Insufficient documentation

## 2015-08-26 DIAGNOSIS — F329 Major depressive disorder, single episode, unspecified: Secondary | ICD-10-CM

## 2015-08-26 DIAGNOSIS — D649 Anemia, unspecified: Secondary | ICD-10-CM

## 2015-08-26 LAB — CBC WITH DIFFERENTIAL/PLATELET
BASOS ABS: 0.1 10*3/uL (ref 0–0.1)
Basophils Relative: 1 %
Eosinophils Absolute: 0.1 10*3/uL (ref 0–0.7)
Eosinophils Relative: 1 %
HEMATOCRIT: 26.7 % — AB (ref 35.0–47.0)
HEMOGLOBIN: 8.8 g/dL — AB (ref 12.0–16.0)
LYMPHS PCT: 17 %
Lymphs Abs: 1.6 10*3/uL (ref 1.0–3.6)
MCH: 28.9 pg (ref 26.0–34.0)
MCHC: 32.9 g/dL (ref 32.0–36.0)
MCV: 88.1 fL (ref 80.0–100.0)
MONO ABS: 0.5 10*3/uL (ref 0.2–0.9)
Monocytes Relative: 5 %
NEUTROS ABS: 7.3 10*3/uL — AB (ref 1.4–6.5)
NEUTROS PCT: 76 %
Platelets: 138 10*3/uL — ABNORMAL LOW (ref 150–440)
RBC: 3.03 MIL/uL — AB (ref 3.80–5.20)
RDW: 16.2 % — AB (ref 11.5–14.5)
WBC: 9.6 10*3/uL (ref 3.6–11.0)

## 2015-08-26 LAB — GLUCOSE, CAPILLARY
GLUCOSE-CAPILLARY: 76 mg/dL (ref 65–99)
Glucose-Capillary: 95 mg/dL (ref 65–99)

## 2015-08-26 LAB — BASIC METABOLIC PANEL
ANION GAP: 4 — AB (ref 5–15)
BUN: 19 mg/dL (ref 6–20)
CHLORIDE: 112 mmol/L — AB (ref 101–111)
CO2: 26 mmol/L (ref 22–32)
Calcium: 8.8 mg/dL — ABNORMAL LOW (ref 8.9–10.3)
Creatinine, Ser: 0.85 mg/dL (ref 0.44–1.00)
GFR calc Af Amer: >60 mL/min (ref >60)
GFR calc non Af Amer: >60 mL/min (ref >60)
GLUCOSE: 86 mg/dL (ref 65–99)
POTASSIUM: 3.9 mmol/L (ref 3.5–5.1)
Sodium: 142 mmol/L (ref 135–145)

## 2015-08-26 LAB — TROPONIN I

## 2015-08-26 MED ORDER — FLECAINIDE ACETATE 100 MG PO TABS: 100.0000 mg | ORAL_TABLET | Freq: Two times a day (BID) | ORAL | Status: DC

## 2015-08-26 MED ORDER — FLECAINIDE ACETATE 50 MG PO TABS: 50.0000 mg | ORAL_TABLET | Freq: Two times a day (BID) | ORAL | Status: DC

## 2015-08-26 MED ORDER — CEFUROXIME AXETIL 250 MG PO TABS: 250.0000 mg | ORAL_TABLET | Freq: Two times a day (BID) | ORAL | Status: DC

## 2015-08-26 MED ORDER — DIGOXIN 125 MCG PO TABS: 0.1250 mg | ORAL_TABLET | Freq: Every day | ORAL | Status: DC

## 2015-08-26 MED ORDER — DEXTROSE 50 % IV SOLN
1.0000 | Freq: Once | INTRAVENOUS | Status: AC
  Administered 2015-08-26: 50 mL via INTRAVENOUS

## 2015-08-26 MED ORDER — FLECAINIDE ACETATE 100 MG PO TABS
100.0000 mg | ORAL_TABLET | Freq: Two times a day (BID) | ORAL | Status: DC
  Filled 2015-08-26 (×3): qty 1

## 2015-08-26 MED ORDER — MIDODRINE HCL 10 MG PO TABS: 10.0000 mg | ORAL_TABLET | Freq: Three times a day (TID) | ORAL | Status: DC

## 2015-08-26 MED ORDER — FLECAINIDE ACETATE 50 MG PO TABS
50.0000 mg | ORAL_TABLET | Freq: Two times a day (BID) | ORAL | Status: DC
  Administered 2015-08-26 – 2015-08-27 (×2): 50 mg via ORAL
  Filled 2015-08-26 (×3): qty 1

## 2015-08-26 MED ORDER — DEXTROSE 50 % IV SOLN
INTRAVENOUS | Status: AC
  Filled 2015-08-26: qty 50

## 2015-08-26 NOTE — Progress Notes (Signed)
Rapid Response Event Note  Overview:    patient was brought back from MRI, was up to bedside commode. Patient become unresponsive with eyes open. Rapid was  called by charge nurse.   Initial Focused Assessment: Upon entering room, patient vss, hr 60-70 sat 97 on RA. Patient staring off, unresponsive at times. However patient is able to follow commands with neuro check then goes unresponsive and stares off.    Interventions: Blood sugar was checked result of 76, md ordered to give amp d50, new iv was started. Dr. Benjie Karvonen unable to come to the floor, dr. Margaretmary [name] came to the room. Dr. Margaretmary [name] had spoke with dr. Benjie Karvonen and updated on patient. I also updated md. Dr. Margaretmary [name] ordered to psych consult.   Event Summary:   at      at          [facility]

## 2015-08-26 NOTE — Progress Notes (Signed)
Patient: Lindsay [name] / Admit Date: 08/23/2015 / Date of Encounter: 08/26/2015, 9:24 AM   Subjective: Telemetry showing no further episodes of atrial fibrillation Flecainide started yesterday She is very anxious today, tearful, reports that she lives alone, afraid of having additional passout spells Unable to work with PT yesterday as blood pressure dropped with standing down to 44Y systolic Midodrine increased up to 10 mg, dose given this morning with improved blood pressure, orthostatics pending Reports having severe cramping, sharp shooting pains in her arms, sometimes her legs. She has a muscle relaxer pill  Review of Systems: Review of Systems  Constitutional: Positive for weight loss.  Respiratory: Negative.   Cardiovascular: Negative.   Gastrointestinal: Negative.   Musculoskeletal: Negative.   Neurological: Positive for weakness.  Psychiatric/Behavioral: Negative.   All other systems reviewed and are negative.   Objective: Telemetry: Normal sinus rhythm Physical Exam: Blood pressure 132/104, pulse 85, temperature 98.2 F (36.8 C), temperature source Oral, resp. rate 18, height 5\' 6"  (1.676 m), weight 119 lb (53.978 kg), SpO2 96 %. Body mass index is 19.22 kg/(m^2). General: Well developed, well nourished, in no acute distress. Head: Normocephalic, atraumatic, sclera non-icteric, no xanthomas, nares are without discharge. Neck: Negative for carotid bruits. JVP not elevated. Lungs: Clear bilaterally to auscultation without wheezes, rales, or rhonchi. Breathing is unlabored. Heart: RRR S1 S2 without murmurs, rubs, or gallops.  Abdomen: Soft, non-tender, non-distended with normoactive bowel sounds. No rebound/guarding. Extremities: No clubbing or cyanosis. No edema. Distal pedal pulses are 2+ and equal bilaterally. Neuro: Alert and oriented X 3. Moves all extremities spontaneously. Psych:  Responds to questions appropriately with a normal affect.   Intake/Output  Summary (Last 24 hours) at 08/26/15 0924 Last data filed at 08/26/15 0700  Gross per 24 hour  Intake      0 ml  Output   2100 ml  Net  -2100 ml    Inpatient Medications:  . anastrozole  1 mg Oral Daily  . aspirin EC  81 mg Oral Daily  . cefUROXime  250 mg Oral BID WC  . cholecalciferol  1,000 Units Oral Daily  . digoxin  0.125 mg Oral Daily  . enoxaparin (LOVENOX) injection  40 mg Subcutaneous Q24H  . ferrous sulfate  325 mg Oral Q breakfast  . flecainide  50 mg Oral Q12H  . fludrocortisone  0.2 mg Oral Daily  . fluticasone  1 spray Each Nare Daily  . lidocaine  1 patch Transdermal Q24H  . midodrine  10 mg Oral TID WC  . mirtazapine  15 mg Oral QHS  . morphine  15 mg Oral TID  . pantoprazole  40 mg Oral Daily  . potassium chloride  20 mEq Oral BID  . pregabalin  150 mg Oral TID  . sertraline  50 mg Oral Daily  . spironolactone  25 mg Oral Daily  . tamsulosin  0.4 mg Oral Daily  . tiZANidine  2 mg Oral TID  . vitamin B-12  1,000 mcg Oral Daily  . vitamin C  500 mg Oral Daily   Infusions:    Labs:  Recent Labs  08/23/15 1355 08/26/15 0420  NA 140 142  K 4.0 3.9  CL 113* 112*  CO2 24 26  GLUCOSE 85 86  BUN 16 19  CREATININE 1.01* 0.85  CALCIUM 8.9 8.8*   No results for input(s): AST, ALT, ALKPHOS, BILITOT, PROT, ALBUMIN in the last 72 hours.  Recent Labs  08/23/15 1355 08/26/15 0420  WBC  8.4 9.6  NEUTROABS  --  7.3*  HGB 10.2* 8.8*  HCT 31.5* 26.7*  MCV 86.9 88.1  PLT 172 138*    Recent Labs  08/23/15 1355 08/23/15 1753 08/23/15 2235 08/24/15 0425  TROPONINI <0.03 <0.03 <0.03 <0.03   Invalid input(s): POCBNP No results for input(s): HGBA1C in the last 72 hours.   Weights: Filed Weights   08/23/15 1326  Weight: 119 lb (53.978 kg)     Radiology/Studies:  Dg Chest 2 View  08/23/2015  CLINICAL DATA:  Chest pain EXAM: CHEST  2 VIEW COMPARISON:  05/23/2015 FINDINGS: Cardiomediastinal silhouette is stable. Surgical clips are noted in right  axilla. No acute infiltrate or pleural effusion. No pulmonary edema. Mild degenerative changes thoracic spine. IMPRESSION: No active cardiopulmonary disease. Electronically Signed   By: Lahoma Crocker M.D.   On: 08/23/2015 14:26   Ct Head Wo Contrast  08/23/2015  CLINICAL DATA:  Syncope EXAM: CT HEAD WITHOUT CONTRAST TECHNIQUE: Contiguous axial images were obtained from the base of the skull through the vertex without intravenous contrast. COMPARISON:  01/12/2015 FINDINGS: Mild global atrophy. Minimal chronic ischemic changes in the periventricular white matter. No mass effect, midline shift, or acute hemorrhage. Mastoid air cells are clear. Cranium is intact. IMPRESSION: No acute intracranial pathology. Electronically Signed   By: Marybelle Killings M.D.   On: 08/23/2015 14:41   US Carotid Bilateral  08/24/2015  CLINICAL DATA:  Syncope, stroke symptoms EXAM: BILATERAL CAROTID DUPLEX ULTRASOUND TECHNIQUE: Pearline Cables scale imaging, color Doppler and duplex ultrasound were performed of bilateral carotid and vertebral arteries in the neck. COMPARISON:  None. FINDINGS: Criteria: Quantification of carotid stenosis is based on velocity parameters that correlate the residual internal carotid diameter with NASCET-based stenosis levels, using the diameter of the distal internal carotid lumen as the denominator for stenosis measurement. The following velocity measurements were obtained: RIGHT ICA:  50/20 cm/sec CCA:  03/50 cm/sec SYSTOLIC ICA/CCA RATIO:  0.7 DIASTOLIC ICA/CCA RATIO:  0.8 ECA:  53 cm/sec LEFT ICA:  55/26 cm/sec CCA:  09/38 cm/sec SYSTOLIC ICA/CCA RATIO:  0.8 DIASTOLIC ICA/CCA RATIO:  1.2 ECA:  53 cm/sec RIGHT CAROTID ARTERY: Minor echogenic shadowing plaque formation. No hemodynamically significant right ICA stenosis, velocity elevation, or turbulent flow. Degree of narrowing less than 50%. RIGHT VERTEBRAL ARTERY:  Antegrade LEFT CAROTID ARTERY: Similar scattered minor echogenic plaque formation. No hemodynamically  significant left ICA stenosis, velocity elevation, or turbulent flow. LEFT VERTEBRAL ARTERY:  Antegrade IMPRESSION: Minor carotid atherosclerosis. No hemodynamically significant ICA stenosis. Degree of narrowing less than 50% bilaterally. Electronically Signed   By: Jerilynn Mages.  Shick M.D.   On: 08/24/2015 14:49   Nm Myocar Multi W/spect W/wall Motion / Ef  08/25/2015  Pharmacological myocardial perfusion imaging study with no significant  ischemia Normal wall motion, EF estimated at 61% No EKG changes concerning for ischemia. 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 She was noted to have low blood pressure and was given IV fluids. She has chronic Diarrhea, also with chronic orthostatic hypotension possibly secondary to her  chemotherapy though she has had C. Difficile. She was treated for the C. Difficile in 10/2011 with flagyl. Presenting with syncope, orthostasis Noted to have atrial fibrillation, paroxysmal   1) Syncope:  she takes midodrine 5 mg 3 times a day, also takes Florinef 0.1 mg daily. Was doing well until recently Symptoms possibly secondary to atrial fibrillation, in setting of orthostasis ---midodrine 10 mg 3 times per day. --Florinef daily We'll check orthostatics today  2) Atrial fibrillation, paroxysmal  flecainide 100 mg twice a day  --Outpatient 30 day monitor. We will order this --Also will consider anticoagulation as an outpatient if she continues to have paroxysmal atrial  fibrillation  3) anemia Etiology unclear, will check iron studies Suspect nutritional  4) orthostatic hypotension Chronic issue, previously done well on Florinef and midodrine We'll increase midodrine for blood pressure support given continued hypotension  5) weakness/malnutrition Encouraged increased by mouth intake Low body weight likely contributing to hypotension  6) chronic systolic CHF Ejection fraction 45-50% Appears relatively euvolemic Unable to add additional medications for CHF/systolic dysfunction  Signed, Esmond Plants, MD [facility] 08/26/2015, 9:24 AM

## 2015-08-26 NOTE — Progress Notes (Signed)
Physical Therapy Treatment Patient Details Name: Lindsay [name] MRN: [id number] DOB: September 03, 1956 Today's Date: 08/26/2015    History of Present Illness Pt is a 59 y.o. female presenting to hospital with syncopal episode at home; pt then with syncopal episode in ED and also another syncopal episode upon admission.  Pt admitted with syncope, a-fib, RVR, and orthostatic hypotension.  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    PT Comments    Pt progressing towards goals with ambulation today, however she did have possible syncopal episode towards the end of ambulation (please reference ambulation section for events leading up to syncopal event). Pt began becoming less responsive and weaker so she was sat down in the recliner behind her. There was no full give-out of her strength as it was felt that she was assisting in her lowering towards the recliner. Pt was unresponsive with eyes open while sitting in recliner. Vitals were taken: HR = 67, BP = 109/74. Nurse and MD present at that moment. After about two minutes of monitoring and attempted arousal pt attended to staff very tearful, however still non-responsive verbally. Pt was wheeled back to her room where she then began clutching her hands to her chest with a distressed look on her face. Nursing notified. Pt monitored on telemetry with HR of 77 at that point and a blood pressure of 143/99. After close monitoring and about one minute time gone by she had no further gesturing towards chest or distress on her face. She remained non-responsive towards staff, only keeping eyes open. Pt then cleared by nursing to be transferred back into bed by therapy. Nursing present in room at PT exit. Given the pt's instability with ambulation and unpredictable syncopal episodes, she will need to have 24 hr assistance at home, especially with any  mobility to prevent pt fall. She will continue to benefit from skilled PT in order to address her gait and mobility deficits so that she can return to premorbid state.   Follow Up Recommendations  Home health PT;Supervision/Assistance - 24 hour     Equipment Recommendations  None recommended by PT    Recommendations for Other Services       Precautions / Restrictions Precautions Precautions: Fall Precaution Comments: Orthostatic hypotension Restrictions Weight Bearing Restrictions: No    Mobility  Bed Mobility Overal bed mobility: Modified Independent             General bed mobility comments: Only needs side rails to get to EOB.   Transfers Overall transfer level: Modified independent Equipment used: Rolling walker (2 wheeled)             General transfer comment: Pt slightly more wobbly once getting into standing this date. Needs no assist to get up into standing.   Ambulation/Gait Ambulation/Gait assistance: Min assist Ambulation Distance (Feet): 140 Feet Assistive device: Rolling walker (2 wheeled) Gait Pattern/deviations: Narrow base of support;Decreased step length - right;Decreased step length - left Gait velocity: decreased Gait velocity interpretation: Below normal speed for age/gender General Gait Details: Pt's BP in sitting today was 119/72 and in standing her BP was 92/63. Pt became mildly lightheaded and diaphoretic so she was sat back down into supine where her BP was 89/66. Due to pt discharging today she wished to attempt ambulation one more time. During ambulation pt's standing BP was 92/68 and she was mildly lethargic but wished to continue to walk. With RW pt  lacks strength in LEs to maintain stability in terminal knee extension as she displays minor giving way of knees that are correctable by the pt and her RW. Therapist needed occasional assist to stabilize pt.   Stairs            Wheelchair Mobility    Modified Rankin (Stroke Patients  Only)       Balance Overall balance assessment: History of Falls                                  Cognition Arousal/Alertness: Awake/alert Behavior During Therapy: Anxious Overall Cognitive Status: Within Functional Limits for tasks assessed                      Exercises      General Comments        Pertinent Vitals/Pain Pain Assessment: No/denies pain    Home Living Family/patient expects to be discharged to:: Private residence                    Prior Function            PT Goals (current goals can now be found in the care plan section) Acute Rehab PT Goals Patient Stated Goal: none stated PT Goal Formulation: With patient Time For Goal Achievement: 09/07/15 Potential to Achieve Goals: Fair Progress towards PT goals: Progressing toward goals    Frequency  Min 2X/week    PT Plan Discharge plan needs to be updated    Co-evaluation             End of Session Equipment Utilized During Treatment: Gait belt Activity Tolerance:  (Pt limited by low blood pressure) Patient left: in bed;with call bell/phone within reach;with bed alarm set;with nursing/sitter in room     Time: 1045-1120 PT Time Calculation (min) (ACUTE ONLY): 35 min  Charges:                       G CodesJanyth Contes Sep 08, 2015, 12:26 PM  Janyth Contes, SPT. [phone]

## 2015-08-26 NOTE — Progress Notes (Signed)
   08/26/15 1255  Clinical Encounter Type  Visited With Patient  Visit Type Initial  Consult/Referral To Chaplain  Spiritual Encounters  Spiritual Needs Emotional  Stress Factors  Patient Stress Factors Health changes  Met w/patient to provide pastoral care.  Chap. Kinta Martis G. [place]

## 2015-08-26 NOTE — Progress Notes (Signed)
[place] [facility] [place] NAME: Lindsay [name]    MR#:  [id number]  DATE OF BIRTH:  21-Jul-1956  SUBJECTIVE:  Patient doing much better this morning. She is anxious about getting up and checking orthostatics. She denies dysuria or fever/chills.  REVIEW OF SYSTEMS:    Review of Systems  Constitutional: Negative for fever, chills and malaise/fatigue.  HENT: Negative for sore throat.   Eyes: Negative for blurred vision.  Respiratory: Negative for cough, hemoptysis, shortness of breath and wheezing.   Cardiovascular: Negative for chest pain, palpitations, orthopnea, claudication, leg swelling and PND.  Gastrointestinal: Negative for nausea, vomiting, abdominal pain, diarrhea and blood in stool.  Genitourinary: Negative for dysuria and urgency.  Musculoskeletal: Negative for back pain.  Neurological: Negative for dizziness, tremors and headaches.  Endo/Heme/Allergies: Does not bruise/bleed easily.    Tolerating Diet:yes      DRUG ALLERGIES:  No Known Allergies  VITALS:  Blood pressure 132/104, pulse 85, temperature 98.2 F (36.8 C), temperature source Oral, resp. rate 18, height 5\' 6"  (1.676 m), weight 53.978 kg (119 lb), SpO2 96 %.  PHYSICAL EXAMINATION:   Physical Exam  Constitutional: She is oriented to person, place, and time and well-developed, well-nourished, and in no distress. No distress.  HENT:  Head: Normocephalic.  Eyes: No scleral icterus.  Neck: Normal range of motion. Neck supple. No JVD present. No tracheal deviation present.  Cardiovascular: Normal rate, regular rhythm and normal heart sounds.  Exam reveals no gallop and no friction rub.   No murmur heard. Pulmonary/Chest: Effort normal and breath sounds normal. No respiratory distress. She has no wheezes. She has no rales. She exhibits no tenderness.  Abdominal: Soft. Bowel sounds are normal. She exhibits no distension and no mass. There is no tenderness. There  is no rebound and no guarding.  Musculoskeletal: Normal range of motion. She exhibits no edema.  Neurological: She is alert and oriented to person, place, and time.  Skin: Skin is warm. No rash noted. No erythema.  Psychiatric: Affect and judgment normal.      LABORATORY PANEL:   CBC  Recent Labs Lab 08/26/15 0420  WBC 9.6  HGB 8.8*  HCT 26.7*  PLT 138*   ------------------------------------------------------------------------------------------------------------------  Chemistries   Recent Labs Lab 08/26/15 0420  NA 142  K 3.9  CL 112*  CO2 26  GLUCOSE 86  BUN 19  CREATININE 0.85  CALCIUM 8.8*   ------------------------------------------------------------------------------------------------------------------  Cardiac Enzymes  Recent Labs Lab 08/23/15 1753 08/23/15 2235 08/24/15 0425  TROPONINI <0.03 <0.03 <0.03   ------------------------------------------------------------------------------------------------------------------  RADIOLOGY:  US Carotid Bilateral  08/24/2015  CLINICAL DATA:  Syncope, stroke symptoms EXAM: BILATERAL CAROTID DUPLEX ULTRASOUND TECHNIQUE: Pearline Cables scale imaging, color Doppler and duplex ultrasound were performed of bilateral carotid and vertebral arteries in the neck. COMPARISON:  None. FINDINGS: Criteria: Quantification of carotid stenosis is based on velocity parameters that correlate the residual internal carotid diameter with NASCET-based stenosis levels, using the diameter of the distal internal carotid lumen as the denominator for stenosis measurement. The following velocity measurements were obtained: RIGHT ICA:  50/20 cm/sec CCA:  40/98 cm/sec SYSTOLIC ICA/CCA RATIO:  0.7 DIASTOLIC ICA/CCA RATIO:  0.8 ECA:  53 cm/sec LEFT ICA:  55/26 cm/sec CCA:  11/91 cm/sec SYSTOLIC ICA/CCA RATIO:  0.8 DIASTOLIC ICA/CCA RATIO:  1.2 ECA:  53 cm/sec RIGHT CAROTID ARTERY: Minor echogenic shadowing plaque formation. No hemodynamically significant right  ICA stenosis, velocity elevation, or turbulent flow. Degree of narrowing less than  50%. RIGHT VERTEBRAL ARTERY:  Antegrade LEFT CAROTID ARTERY: Similar scattered minor echogenic plaque formation. No hemodynamically significant left ICA stenosis, velocity elevation, or turbulent flow. LEFT VERTEBRAL ARTERY:  Antegrade IMPRESSION: Minor carotid atherosclerosis. No hemodynamically significant ICA stenosis. Degree of narrowing less than 50% bilaterally. Electronically Signed   By: Jerilynn Mages.  Shick M.D.   On: 08/24/2015 14:49   Nm Myocar Multi W/spect W/wall Motion / Ef  08/25/2015  Pharmacological myocardial perfusion imaging study with no significant  ischemia Normal wall motion, EF estimated at 61% No EKG changes concerning for ischemia. Baseline abnormal T wave anterolateral leads Low risk scan Signed, Esmond Plants MD [facility]     ASSESSMENT AND PLAN:   This is a 59 year old female with nonischemic cardiomyopathy EF 45%, PAF who presented with multiple episodes of recurrent syncope and A. fib and RVR.  1. Syncope: This was thought to be secondary to orthostatic hypotension. Patient has a known history of orthostasis for several years. Cardiology recommends increasing Miller drain to 10 g by mouth 3 times a day and continue on Florinef. She will also have a 30 day cardiac monitor arranged at discharge.  2. PAF with RVR: Patient is now in normal sinus rhythm. Cardiology recommends fleck and 900 mg by mouth twice a day. Patient will also be evaluated for anticoagulation as an outpatient if she does not stay in normal sinus rhythm. Patient will have a 30 day cardiac monitor arranged by cardiology prior to discharge.    3. History of breast cancer: Continue arimedex  4. Depression: Continue Remeron   5. Chronic systolic heart failure: Patient does not appear to be in exacerbation at this time.  6. Fever: Due to urinary tract infection without hematuria. Patient's been afebrile. Patient was started on  Ceftin which will be continued for a treatment course of 5 days.  Management plans discussed with the patient and she is in agreement.  CODE STATUS: FULL  TOTAL TIME TAKING CARE OF THIS PATIENT: 28 minutes.     POSSIBLE D/C today DEPENDING ON CLINICAL CONDITION.   Leyna Vanderkolk M.D on 08/26/2015 at 10:51 AM  Between 7am to 6pm - Pager - [phone] After 6pm go to [email or web address] - password [email or web address]  Office  [phone]  CC: Primary care physician; Pcp Not In System  Note: This dictation was prepared with Dragon dictation along with smaller phrase technology. Any transcriptional errors that result from this process are unintentional.

## 2015-08-26 NOTE — Discharge Summary (Addendum)
[facility] [place] NAME: Lindsay [name]    MR#:  [id number]  DATE OF BIRTH:  Sep 01, 1956  DATE OF ADMISSION:  08/23/2015 ADMITTING PHYSICIAN: Fritzi Mandes, MD  DATE OF DISCHARGE: 08/26/2015    PRIMARY CARE PHYSICIAN: Pcp Not In System    ADMISSION DIAGNOSIS:  Syncope and collapse [R55] Chest pain, unspecified chest pain type [R07.9]  DISCHARGE DIAGNOSIS:     Recurrent Syncope suspect orthostatic hypotension-chronic   Syncope and collapse   Paroxysmal a-fib (HCC)   Orthostatic hypotension   Paroxysmal atrial fibrillation ([place])    SECONDARY DIAGNOSIS:   Past Medical History  Diagnosis Date  . HX: breast cancer     right  . Peripheral neuropathy ([place])   . Orthostatic hypotension   . Depression   . Vitamin D deficiency   . Anemia   . Depression   . Chronic systolic CHF (congestive heart failure) ([place])   . Muscle spasm   . Chronic cough   . C. difficile diarrhea 10/2011  . Kidney stones 2013  . Cancer ([place])   . PAF (paroxysmal atrial fibrillation) [facility])     HOSPITAL COURSE:  59 year old female with nonischemic cardiomyopathy EF 45%, PAF who presented with multiple episodes of recurrent syncope and A. fib and RVR.  1. Syncope: This was thought to be secondary to orthostatic hypotension. Patient has a known history of orthostasis for several years. Cardiology recommends increasing midodrin to 10 g by mouth 3 times a day and continue on Florinef. She will also have a 30 day cardiac monitor arranged at discharge. -neurology eval negative. MRI/MRA brain/caoritd doppler negative.  EEG negative  2. PAF with RVR: Patient is now in normal sinus rhythm. Cardiology recommends flecainide 50 mg by mouth twice a day.  -Patient will also be evaluated for anticoagulation as an outpatient if she does not stay in normal sinus rhythm. Patient will have a 30 day cardiac monitor arranged by cardiology prior to discharge. -Tele in NSR    3. History of breast cancer: Continue arimedex  4. Depression: Continue Remeron   5. Chronic systolic heart failure: Patient does not appear to be in exacerbation at this time.  6. Fever: Due to urinary tract infection without hematuria. Patient's been afebrile. Patient was started on Ceftin which will be continued for a treatment course of 5 days.  7. PT eval noted HHPT recommended. Pt has declined services in the past per CM note  D/c home Spoke with Dr Fletcher Anon  DISCHARGE CONDITIONS AND DIET:  Stable on regular diet Home with home health  CONSULTS OBTAINED:  Treatment Team:  Wellington Hampshire, MD  DRUG ALLERGIES:  No Known Allergies  DISCHARGE MEDICATIONS:   Current Discharge Medication List    START taking these medications   Details  cefUROXime (CEFTIN) 250 MG tablet Take 1 tablet (250 mg total) by mouth 2 (two) times daily with a meal. Qty: 10 tablet, Refills: 0    flecainide (TAMBOCOR) 100 MG tablet Take 1 tablet (100 mg total) by mouth every 12 (twelve) hours. Qty: 60 tablet, Refills: 0      CONTINUE these medications which have CHANGED   Details  midodrine (PROAMATINE) 10 MG tablet Take 1 tablet (10 mg total) by mouth 3 (three) times daily with meals. Qty: 90 tablet, Refills: 0      CONTINUE these medications which have NOT CHANGED   Details  anastrozole (ARIMIDEX) 1 MG tablet Take 1 mg by mouth daily.  aspirin EC 81 MG tablet Take 81 mg by mouth daily.    Cholecalciferol 10000 UNITS CAPS Take 1,000 Units by mouth daily.    clonazePAM (KLONOPIN) 0.5 MG tablet Take 0.5 mg by mouth at bedtime.     ferrous sulfate 325 (65 FE) MG tablet Take 325 mg by mouth daily with breakfast.      fludrocortisone (FLORINEF) 0.1 MG tablet Take 0.2 mg by mouth daily.    fluticasone (FLONASE) 50 MCG/ACT nasal spray Place 1 spray into the nose daily.     lidocaine (LIDODERM) 5 % Place 1 patch onto the skin daily. Remove & Discard patch within 12 hours or as directed by  MD    loperamide (IMODIUM) 2 MG capsule Take 2 mg by mouth 4 (four) times daily as needed for diarrhea or loose stools.    loratadine (CLARITIN) 10 MG tablet Take 10 mg by mouth daily as needed for allergies.     mirtazapine (REMERON) 15 MG tablet Take 15 mg by mouth at bedtime.      morphine (MS CONTIN) 15 MG 12 hr tablet Take 15 mg by mouth every 8 (eight) hours.    omeprazole (PRILOSEC) 40 MG capsule Take 40 mg by mouth daily.    oxyCODONE (ROXICODONE) 15 MG immediate release tablet Take 15 mg by mouth every 6 (six) hours as needed for pain.    potassium chloride (K-DUR) 10 MEQ tablet Take 20 mEq by mouth 2 (two) times daily.    pregabalin (LYRICA) 150 MG capsule Take 150 mg by mouth 3 (three) times daily.    sertraline (ZOLOFT) 50 MG tablet Take 50 mg by mouth daily.    spironolactone (ALDACTONE) 25 MG tablet Take 25 mg by mouth daily.    tamsulosin (FLOMAX) 0.4 MG CAPS capsule Take 1 capsule (0.4 mg total) by mouth daily. Qty: 30 capsule, Refills: 1    tiZANidine (ZANAFLEX) 2 MG tablet Take 2 mg by mouth 3 (three) times daily.    vitamin B-12 (CYANOCOBALAMIN) 1000 MCG tablet Take 1,000 mcg by mouth daily.     vitamin C (ASCORBIC ACID) 500 MG tablet Take 500 mg by mouth daily.       Today   CHIEF COMPLAINT:  No issues but just anxious about orthostasis  VITAL SIGNS:  Blood pressure 132/104, pulse 85, temperature 98.2 F (36.8 C), temperature source Oral, resp. rate 18, height 5\' 6"  (1.676 m), weight 53.978 kg (119 lb), SpO2 96 %.   REVIEW OF SYSTEMS:  Review of Systems  Constitutional: Negative for fever, chills and malaise/fatigue.  HENT: Negative for sore throat.   Eyes: Negative for blurred vision.  Respiratory: Negative for cough, hemoptysis, shortness of breath and wheezing.   Cardiovascular: Negative for chest pain, palpitations and leg swelling.  Gastrointestinal: Negative for nausea, vomiting, abdominal pain, diarrhea and blood in stool.  Genitourinary:  Negative for dysuria, urgency and frequency.  Musculoskeletal: Negative for back pain.  Neurological: Negative for dizziness, tremors and headaches.  Endo/Heme/Allergies: Does not bruise/bleed easily.  Psychiatric/Behavioral: The patient is nervous/anxious.    PHYSICAL EXAMINATION:  GENERAL:  59 y.o.-year-old patient lying in the bed with no acute distress. Thin NECK:  Supple, no jugular venous distention. No thyroid enlargement, no tenderness.  LUNGS: Normal breath sounds bilaterally, no wheezing, rales,rhonchi  No use of accessory muscles of respiration.  CARDIOVASCULAR: S1, S2 normal. No murmurs, rubs, or gallops.  ABDOMEN: Soft, non-tender, non-distended. Bowel sounds present. No organomegaly or mass.  EXTREMITIES: No pedal edema, cyanosis, or  clubbing.  PSYCHIATRIC: The patient is alert and oriented x 3.  SKIN: No obvious rash, lesion, or ulcer.   DATA REVIEW:   CBC  Recent Labs Lab 08/26/15 0420  WBC 9.6  HGB 8.8*  HCT 26.7*  PLT 138*    Chemistries   Recent Labs Lab 08/26/15 0420  NA 142  K 3.9  CL 112*  CO2 26  GLUCOSE 86  BUN 19  CREATININE 0.85  CALCIUM 8.8*    Cardiac Enzymes  Recent Labs Lab 08/23/15 1753 08/23/15 2235 08/24/15 0425  TROPONINI <0.03 <0.03 <0.03    Microbiology Results  @[email or web address] @  RADIOLOGY:  US Carotid Bilateral  08/24/2015  CLINICAL DATA:  Syncope, stroke symptoms EXAM: BILATERAL CAROTID DUPLEX ULTRASOUND TECHNIQUE: Pearline Cables scale imaging, color Doppler and duplex ultrasound were performed of bilateral carotid and vertebral arteries in the neck. COMPARISON:  None. FINDINGS: Criteria: Quantification of carotid stenosis is based on velocity parameters that correlate the residual internal carotid diameter with NASCET-based stenosis levels, using the diameter of the distal internal carotid lumen as the denominator for stenosis measurement. The following velocity measurements were obtained: RIGHT ICA:  50/20 cm/sec CCA:  16/01  cm/sec SYSTOLIC ICA/CCA RATIO:  0.7 DIASTOLIC ICA/CCA RATIO:  0.8 ECA:  53 cm/sec LEFT ICA:  55/26 cm/sec CCA:  09/32 cm/sec SYSTOLIC ICA/CCA RATIO:  0.8 DIASTOLIC ICA/CCA RATIO:  1.2 ECA:  53 cm/sec RIGHT CAROTID ARTERY: Minor echogenic shadowing plaque formation. No hemodynamically significant right ICA stenosis, velocity elevation, or turbulent flow. Degree of narrowing less than 50%. RIGHT VERTEBRAL ARTERY:  Antegrade LEFT CAROTID ARTERY: Similar scattered minor echogenic plaque formation. No hemodynamically significant left ICA stenosis, velocity elevation, or turbulent flow. LEFT VERTEBRAL ARTERY:  Antegrade IMPRESSION: Minor carotid atherosclerosis. No hemodynamically significant ICA stenosis. Degree of narrowing less than 50% bilaterally. Electronically Signed   By: Jerilynn Mages.  Shick M.D.   On: 08/24/2015 14:49   Nm Myocar Multi W/spect W/wall Motion / Ef  08/25/2015  Pharmacological myocardial perfusion imaging study with no significant  ischemia Normal wall motion, EF estimated at 61% No EKG changes concerning for ischemia. Baseline abnormal T wave anterolateral leads Low risk scan Signed, Esmond Plants MD [facility]   Management plans discussed with the patient and she is in agreement. Stable for discharge home   Patient should follow up with PCP and cardiology in 1 week  CODE STATUS:     Code Status Orders        Start     Ordered   08/23/15 1615  Full code   Continuous     08/23/15 1614    Advance Directive Documentation        Most Recent Value   Type of Advance Directive  Healthcare Power of Attorney   Pre-existing out of facility DNR order (yellow form or pink MOST form)     "MOST" Form in Place?       TOTAL TIME TAKING CARE OF THIS PATIENT: 35 minutes.   Note: This dictation was prepared with Dragon dictation along with smaller phrase technology. Any transcriptional errors that result from this process are unintentional.  Anisten Tomassi M.D on 08/26/2015  Between 7am to 6pm -  Pager - [phone] After 6pm go to [email or web address] - password [email or web address] [facility]  Office  [phone]  CC: Primary care physician; Pcp Not In System

## 2015-08-26 NOTE — Progress Notes (Signed)
Patient was ambulating in hallway with physical therapyPatient then stopped walking and patients instructed to sit down in the chair. Patient then began to stare off would not verbally respond to staff. Vs checked bp 109/74 hr 62. Patient then became alert enough to respond to staff, however patient still going in and out of responsiveness. Patient returned to room, started to cry and c/o chest pain. Vs checked bp 143/99 hr 72. Called and spoke with dr. Benjie Karvonen to make aware of episode and chest pain. Per md she will order stat ekg and troponins. Will perform neuro checks as well. Once patient returned to bed i was able to talk to patient, patients is alert and very responsive answering all question appropriately [employer]

## 2015-08-26 NOTE — Progress Notes (Signed)
Neuro check showed some right sided weakness in the hand, patient was present for right pronator drift. Notified Dr. Benjie Karvonen. Per MD ordered a MRI without contrast.

## 2015-08-26 NOTE — Therapy (Signed)
Responded to Rapid Response. Patient found minimally responsive. HR 87 palpated radially. SpO2 100% on room air. Patient periodically going unresponsive with apnic episodes. Responds to sternal rub, wakes up briefly. Patient with nursing supervisor, M.D. On the phone.

## 2015-08-26 NOTE — Progress Notes (Signed)
Pt is sleeping and requested not to be woken up for scheduled pain medications. No signs of distress or discomfort noted. Nursing staff will continue to monitor. Earleen Reaper, RN

## 2015-08-26 NOTE — Consult Note (Signed)
[facility] Face-to-Face Psychiatry Consult   Reason for Consult:  Consult for this 59 year old woman in the hospital for evaluation of her syncope. Consult regarding depression. Referring Physician:  Mody Patient Identification: Lindsay [name] MRN:  [id number] Principal Diagnosis: Major depression ([place]) Diagnosis:   Patient Active Problem List   Diagnosis Date Noted  . Major depression ([place]) [F32.9] 08/26/2015  . Weakness of both legs [R29.898]   . Absolute anemia [D64.9]   . Pain in the chest [R07.9]   . Orthostatic hypotension [I95.1]   . Paroxysmal atrial fibrillation (H[facility]) [I48.0]   . Syncope and collapse [R55]   . Paroxysmal a-fib ([place]) [I48.0]   . Atrial fibrillation ([place]) [I48.91] 05/24/2015  . Abdominal pain [R10.9] 05/23/2015  . C. difficile colitis [A04.7] 12/12/2011  . Chest pain [786.5] 11/11/2011  . Syncope [R55] 11/11/2011  . Hypotension [458] 11/11/2011  . Cardiomyopathy [425] 11/11/2011  . AF (atrial fibrillation) ([place]) [I48.91] 11/11/2011    Total Time spent with patient: 1 hour  Subjective:   Lindsay [name] is a 59 y.o. female patient admitted with "I really am not feeling so bad".  HPI:  Patient interviewed. Chart reviewed. Old chart notes reviewed. Labs reviewed. Consult regarding depression. Patient is currently in the hospital because her syncopal episodes have returned. Patient tells me that her mood is actually been pretty good. She says that she has not been feeling particularly depressed at home. She still occasionally will get crying spells when she thinks about sad things in her life but by her standard she is not nearly as depressed and she used to be. She says she usually sleeps reasonably well although since the return of her syncopal episode she's had trouble sleeping. Her appetite has been good and she has gained a little bit of weight. Patient denies absolutely having any suicidal thoughts or morbid thoughts about wanting to "give up". She denies any psychotic  symptoms. She has been continuing to take medication as it was when I saw her last in March especially as far as her depression. Patient admits that she was crying when she spoke to staff earlier. She says at that time she was upset about her syncopal episodes and also finds the hospital environment upsetting and that when she was asked about her husband she started crying even more, however the patient denies that she is feeling consistently depressed.  Past psychiatric history: Patient has had episodes of severe depression in the past. I saw her when she was in the hospital in March for similar symptoms. At that time she was feeling much worse and we made some medicine adjustments. No history of suicide attempts or inpatient hospitalization. Currently gets her medicines prescribed by her primary care doctor.  Social history: Patient lives alone but feels like she has good family connections. She goes to visit her sisters in [place] frequently. Not able to work. Spends most of her time at home relaxing.  Family history: She denies any family history of mental health problems.  Substance abuse history: No history of substance abuse problems past or present.  Medical history: Patient has a past history of cancer in remission. Chronic anemia and weakness. She is currently in the hospital for reevaluation of syncopal episodes of unclear etiology.  Past Psychiatric History: past history of depression but no suicide attempts no hospitalization  Risk to Self: Is patient at risk for suicide?: No Risk to Others:   Prior Inpatient Therapy:   Prior Outpatient Therapy:    Past Medical History:  Past Medical History  Diagnosis Date  . HX: breast cancer     right  . Peripheral neuropathy ([place])   . Orthostatic hypotension   . Depression   . Vitamin D deficiency   . Anemia   . Depression   . Chronic systolic CHF (congestive heart failure) ([place])   . Muscle spasm   . Chronic cough   . C. difficile  diarrhea 10/2011  . Kidney stones 2013  . Cancer ([place])   . PAF (paroxysmal atrial fibrillation) [facility])     Past Surgical History  Procedure Laterality Date  . Breast lumpectomy      right @ [facility]  . Lithotripsy  jan 2013  . Esophagogastroduodenoscopy (egd) with propofol N/A 05/25/2015    Procedure: ESOPHAGOGASTRODUODENOSCOPY (EGD) WITH PROPOFOL;  Surgeon: Manya Silvas, MD;  Location: [facility];  Service: Endoscopy;  Laterality: N/A;  . Abdominal hysterectomy     Family History:  Family History  Problem Relation Age of Onset  . Heart attack Father    Family Psychiatric  History: no family history of any mental health or substance abuse problem. Social History:  History  Alcohol Use No     History  Drug Use No    Social History   Social History  . Marital Status: Widowed    Spouse Name: N/A  . Number of Children: N/A  . Years of Education: N/A   Social History Main Topics  . Smoking status: Former Smoker -- 0.25 packs/day for 1 years    Types: Cigarettes    Quit date: 11/10/1985  . Smokeless tobacco: None  . Alcohol Use: No  . Drug Use: No  . Sexual Activity: Not Asked   Other Topics Concern  . None   Social History Narrative   Additional Social History:                          Allergies:  No Known Allergies  Labs:  Results for orders placed or performed during the hospital encounter of 08/23/15 (from the past 48 hour(s))  Culture, blood (routine x 2)     Status: None (Preliminary result)   Collection Time: 08/25/15  1:54 PM  Result Value Ref Range   Specimen Description BLOOD LEFT ANTECUBITAL    Special Requests BOTTLES DRAWN AEROBIC AND ANAEROBIC 10ML    Culture NO GROWTH < 24 HOURS    Report Status PENDING   Culture, blood (routine x 2)     Status: None (Preliminary result)   Collection Time: 08/25/15  2:03 PM  Result Value Ref Range   Specimen Description BLOOD LEFT HAND    Special Requests BOTTLES DRAWN AEROBIC AND ANAEROBIC 10ML     Culture NO GROWTH < 24 HOURS    Report Status PENDING   Urinalysis complete, with microscopic ([facility] only)     Status: Abnormal   Collection Time: 08/25/15  4:38 PM  Result Value Ref Range   Color, Urine YELLOW (A) YELLOW   APPearance HAZY (A) CLEAR   Glucose, UA NEGATIVE NEGATIVE mg/dL   Bilirubin Urine NEGATIVE NEGATIVE   Ketones, ur NEGATIVE NEGATIVE mg/dL   Specific Gravity, Urine 1.017 1.005 - 1.030   Hgb urine dipstick NEGATIVE NEGATIVE   pH 5.0 5.0 - 8.0   Protein, ur NEGATIVE NEGATIVE mg/dL   Nitrite POSITIVE (A) NEGATIVE   Leukocytes, UA 2+ (A) NEGATIVE   RBC / HPF 0-5 0 - 5 RBC/hpf   WBC, UA 6-30 0 -  5 WBC/hpf   Bacteria, UA RARE (A) NONE SEEN   Squamous Epithelial / LPF 0-5 (A) NONE SEEN   Mucous PRESENT    Hyaline Casts, UA PRESENT   CBC with Differential/Platelet     Status: Abnormal   Collection Time: 08/26/15  4:20 AM  Result Value Ref Range   WBC 9.6 3.6 - 11.0 K/uL   RBC 3.03 (L) 3.80 - 5.20 MIL/uL   Hemoglobin 8.8 (L) 12.0 - 16.0 g/dL   HCT 26.7 (L) 35.0 - 47.0 %   MCV 88.1 80.0 - 100.0 fL   MCH 28.9 26.0 - 34.0 pg   MCHC 32.9 32.0 - 36.0 g/dL   RDW 16.2 (H) 11.5 - 14.5 %   Platelets 138 (L) 150 - 440 K/uL   Neutrophils Relative % 76 %   Neutro Abs 7.3 (H) 1.4 - 6.5 K/uL   Lymphocytes Relative 17 %   Lymphs Abs 1.6 1.0 - 3.6 K/uL   Monocytes Relative 5 %   Monocytes Absolute 0.5 0.2 - 0.9 K/uL   Eosinophils Relative 1 %   Eosinophils Absolute 0.1 0 - 0.7 K/uL   Basophils Relative 1 %   Basophils Absolute 0.1 0 - 0.1 K/uL  Basic metabolic panel     Status: Abnormal   Collection Time: 08/26/15  4:20 AM  Result Value Ref Range   Sodium 142 135 - 145 mmol/L   Potassium 3.9 3.5 - 5.1 mmol/L   Chloride 112 (H) 101 - 111 mmol/L   CO2 26 22 - 32 mmol/L   Glucose, Bld 86 65 - 99 mg/dL   BUN 19 6 - 20 mg/dL   Creatinine, Ser 0.85 0.44 - 1.00 mg/dL   Calcium 8.8 (L) 8.9 - 10.3 mg/dL   GFR calc non Af Amer >60 >60 mL/min   GFR calc Af Amer >60 >60  mL/min    Comment: (NOTE) The eGFR has been calculated using the CKD EPI equation. This calculation has not been validated in all clinical situations. eGFR's persistently <60 mL/min signify possible Chronic Kidney Disease.    Anion gap 4 (L) 5 - 15  Troponin I     Status: None   Collection Time: 08/26/15 11:22 AM  Result Value Ref Range   Troponin I <0.03 <0.031 ng/mL    Comment:        NO INDICATION OF MYOCARDIAL INJURY.   Glucose, capillary     Status: None   Collection Time: 08/26/15  2:35 PM  Result Value Ref Range   Glucose-Capillary 76 65 - 99 mg/dL  Troponin I     Status: None   Collection Time: 08/26/15  5:06 PM  Result Value Ref Range   Troponin I <0.03 <0.031 ng/mL    Comment:        NO INDICATION OF MYOCARDIAL INJURY.     Current Facility-Administered Medications  Medication Dose Route Frequency Provider Last Rate Last Dose  . acetaminophen (TYLENOL) tablet 650 mg  650 mg Oral Q6H PRN Fritzi Mandes, MD   650 mg at 08/25/15 1105   Or  . acetaminophen (TYLENOL) suppository 650 mg  650 mg Rectal Q6H PRN Fritzi Mandes, MD      . anastrozole (ARIMIDEX) tablet 1 mg  1 mg Oral Daily Fritzi Mandes, MD   1 mg at 08/26/15 1014  . aspirin EC tablet 81 mg  81 mg Oral Daily Fritzi Mandes, MD   81 mg at 08/26/15 1014  . cefUROXime (CEFTIN) tablet 250 mg  250  mg Oral BID WC Bettey Costa, MD   250 mg at 08/26/15 1629  . cholecalciferol (VITAMIN D) tablet 1,000 Units  1,000 Units Oral Daily Fritzi Mandes, MD   1,000 Units at 08/26/15 1014  . clonazePAM (KLONOPIN) tablet 0.5 mg  0.5 mg Oral BID PRN Fritzi Mandes, MD   0.5 mg at 08/26/15 2108  . enoxaparin (LOVENOX) injection 40 mg  40 mg Subcutaneous Q24H Fritzi Mandes, MD   40 mg at 08/26/15 1628  . ferrous sulfate tablet 325 mg  325 mg Oral Q breakfast Fritzi Mandes, MD   325 mg at 08/26/15 0841  . flecainide (TAMBOCOR) tablet 50 mg  50 mg Oral Q12H Bettey Costa, MD   50 mg at 08/26/15 2102  . fludrocortisone (FLORINEF) tablet 0.2 mg  0.2 mg Oral Daily  Fritzi Mandes, MD   0.2 mg at 08/26/15 1014  . fluticasone (FLONASE) 50 MCG/ACT nasal spray 1 spray  1 spray Each Nare Daily Fritzi Mandes, MD   1 spray at 08/26/15 1014  . lidocaine (LIDODERM) 5 % 1 patch  1 patch Transdermal Q24H Fritzi Mandes, MD   1 patch at 08/24/15 2214  . loperamide (IMODIUM) capsule 2 mg  2 mg Oral QID PRN Fritzi Mandes, MD   2 mg at 08/25/15 0503  . loratadine (CLARITIN) tablet 10 mg  10 mg Oral Daily PRN Fritzi Mandes, MD      . midodrine (PROAMATINE) tablet 10 mg  10 mg Oral TID WC Minna Merritts, MD   10 mg at 08/26/15 1629  . mirtazapine (REMERON) tablet 15 mg  15 mg Oral QHS Fritzi Mandes, MD   15 mg at 08/26/15 2102  . morphine (MS CONTIN) 12 hr tablet 15 mg  15 mg Oral TID Fritzi Mandes, MD   15 mg at 08/26/15 1631  . nitroGLYCERIN (NITROSTAT) SL tablet 0.4 mg  0.4 mg Sublingual Q5 min PRN Lance Coon, MD   0.4 mg at 08/24/15 0431  . ondansetron (ZOFRAN) tablet 4 mg  4 mg Oral Q6H PRN Fritzi Mandes, MD       Or  . ondansetron (ZOFRAN) injection 4 mg  4 mg Intravenous Q6H PRN Fritzi Mandes, MD      . oxyCODONE (Oxy IR/ROXICODONE) immediate release tablet 15 mg  15 mg Oral Q6H PRN Fritzi Mandes, MD   15 mg at 08/26/15 2108  . pantoprazole (PROTONIX) EC tablet 40 mg  40 mg Oral Daily Fritzi Mandes, MD   40 mg at 08/26/15 1015  . potassium chloride (K-DUR) CR tablet 20 mEq  20 mEq Oral BID Fritzi Mandes, MD   20 mEq at 08/26/15 2102  . pregabalin (LYRICA) capsule 150 mg  150 mg Oral TID Fritzi Mandes, MD   150 mg at 08/26/15 2102  . sertraline (ZOLOFT) tablet 50 mg  50 mg Oral Daily Fritzi Mandes, MD   50 mg at 08/26/15 1015  . spironolactone (ALDACTONE) tablet 25 mg  25 mg Oral Daily Fritzi Mandes, MD   25 mg at 08/26/15 1015  . tamsulosin (FLOMAX) capsule 0.4 mg  0.4 mg Oral Daily Fritzi Mandes, MD   0.4 mg at 08/26/15 1015  . tiZANidine (ZANAFLEX) tablet 2 mg  2 mg Oral TID Fritzi Mandes, MD   2 mg at 08/26/15 2103  . vitamin B-12 (CYANOCOBALAMIN) tablet 1,000 mcg  1,000 mcg Oral Daily Fritzi Mandes, MD   1,000 mcg  at 08/26/15 1015  . vitamin C (ASCORBIC ACID) tablet 500 mg  500 mg  Oral Daily Fritzi Mandes, MD   500 mg at 08/26/15 1014    Musculoskeletal: Strength & Muscle Tone: decreased Gait & Station: unsteady Patient leans: N/A  Psychiatric Specialty Exam: Review of Systems  Constitutional: Negative.   HENT: Negative.   Eyes: Negative.   Respiratory: Negative.   Cardiovascular: Negative.   Gastrointestinal: Negative.   Musculoskeletal: Negative.   Skin: Negative.   Neurological: Positive for dizziness.  Psychiatric/Behavioral: Negative for depression, suicidal ideas, hallucinations, memory loss and substance abuse. The patient is not nervous/anxious and does not have insomnia.     Blood pressure 115/74, pulse 64, temperature 98 F (36.7 C), temperature source Oral, resp. rate 16, height _0  (1.676 m), weight 53.978 kg (119 lb), SpO2 100 %.Body mass index is 19.22 kg/(m^2).  General Appearance: Casual  Eye Contact::  Good  Speech:  Normal Rate  Volume:  Normal  Mood:  Euthymic  Affect:  Congruent  Thought Process:  Coherent  Orientation:  Full (Time, Place, and Person)  Thought Content:  Negative  Suicidal Thoughts:  No  Homicidal Thoughts:  No  Memory:  Immediate;   Fair Recent;   Fair Remote;   Fair  Judgement:  Fair  Insight:  Fair  Psychomotor Activity:  Normal  Concentration:  Fair  Recall:  Good  Fund of Knowledge:Good  Language: Good  Akathisia:  No  Handed:  Right  AIMS (if indicated):     Assets:  Communication Skills Desire for Improvement Financial Resources/Insurance Housing Resilience Social Support  ADL's:  Intact  Cognition: WNL  Sleep:      Treatment Plan Summary: Medication management and Plan 59 year old woman with multiple medical problems and a past history of depression. Consult perhaps was triggered because she was crying earlier when talking to staff. On interview today the patient is minimizing the acuity of her current depressive symptoms.  Admits that she is very worried about her medical problems currently but says that her overall depression is under good control. Patient is comfortable with her current medicine and would not like to have anything changed. There is no indication of any acute dangerousness. She is still taking mirtazapine 15 mg at night. I would not change anything about her medicine. Supportive counseling completed. I will follow up with her tomorrow and make sure things are going okay. Patient appreciates and is agreeable to the plan.  Disposition: No evidence of imminent risk to self or others at present.   Patient does not meet criteria for psychiatric inpatient admission. Supportive therapy provided about ongoing stressors.  Lakeyn Dokken 08/26/2015 9:20 PM

## 2015-08-26 NOTE — Care Management Important Message (Signed)
Important Message  Patient Details  Name: Lindsay [name] MRN: [id number] Date of Birth: 1956-05-05   Medicare Important Message Given:  Yes-second notification given    Darius Bump [place]mond 08/26/2015, 9:43 AM

## 2015-08-26 NOTE — Progress Notes (Addendum)
Nurse reports patient was ambulating with physical therapy and subsequently had a staring spell and was not responsive. Her blood pressure systolic was 650.  she is not orthostatic She is brought to her room where she was complaining of chest pain.  Patient reports that she just feels odd. She is complaining of left-sided chest pain without any radiation or shortness of breath. This is now subsided without any medications that were given.  Temperature 98.2 pulse 55 blood pressure 115/67   GEN alert oriented 39 acute distress Cardiovascular  bradycardia and normal sinus rhythm no murmur, rub  Lungs clear to auscultation bilaterally Abdomen bowel sounds positive nontender nondistended Extremities no clubbing or edema Neuro cranial nerve II through XII are intact there is no focal deficits  Assessment /plan:  1. Chest pain: EKG shows bradycardia with T-wave inversion in V1 no acute ST elevation. Check troponin 3 Continue telemetry  2. PreSyncope: time spent She had carotid Dopplers which were negative just a few days ago along with EEG. She has no reported neurological deficits. At this time I do not feel there is need for a CT scan. I will order neuro checks every 4 hours. She was not orthostatic during this episode. It is noted that her heart rate is low and this may be contributing to her current symptoms. I will decrease dose of flecainide. Digoxin was discontinued this morning  3. UTI: Continue Ceftin  Plan discussed with nurse, patient and case manager  Time spent 30 minutes

## 2015-08-27 ENCOUNTER — Other Ambulatory Visit: Payer: Self-pay | Admitting: Nurse Practitioner

## 2015-08-27 ENCOUNTER — Telehealth: Payer: Self-pay | Admitting: *Deleted

## 2015-08-27 DIAGNOSIS — I480 Paroxysmal atrial fibrillation: Secondary | ICD-10-CM

## 2015-08-27 DIAGNOSIS — F3341 Major depressive disorder, recurrent, in partial remission: Secondary | ICD-10-CM | POA: Insufficient documentation

## 2015-08-27 DIAGNOSIS — R55 Syncope and collapse: Secondary | ICD-10-CM

## 2015-08-27 LAB — TROPONIN I

## 2015-08-27 LAB — BASIC METABOLIC PANEL
Anion gap: 3 — ABNORMAL LOW (ref 5–15)
BUN: 17 mg/dL (ref 6–20)
CALCIUM: 8.9 mg/dL (ref 8.9–10.3)
CO2: 28 mmol/L (ref 22–32)
CREATININE: 0.94 mg/dL (ref 0.44–1.00)
Chloride: 109 mmol/L (ref 101–111)
GFR calc Af Amer: >60 mL/min (ref >60)
GLUCOSE: 81 mg/dL (ref 65–99)
Potassium: 3.8 mmol/L (ref 3.5–5.1)
SODIUM: 140 mmol/L (ref 135–145)

## 2015-08-27 MED ORDER — FLECAINIDE ACETATE 100 MG PO TABS: 50.0000 mg | ORAL_TABLET | Freq: Two times a day (BID) | ORAL | Status: AC

## 2015-08-27 NOTE — Discharge Instructions (Signed)
[place] [facility]  [phone]

## 2015-08-27 NOTE — Care Management (Signed)
Patient is agreeable for home health nurse and physical therapy.  Agency preference is [facility].  Referral called to [facility].  confirmed PCP is Arman Bogus at [facility].  Confirmed address and phone numbers.  Order and face to face present

## 2015-08-27 NOTE — Consult Note (Signed)
  Psychiatry: Follow-up for this 59 year old woman currently in the hospital for evaluation of syncope who has a history of depression. Patient seen and chart reviewed. Patient tells me that her mood is continuing to feel fine. She does not feel sad or depressed. She has no sense of hopelessness. Denies any suicidal thoughts. She appears to be knowledgeable about the current workup and plan for her treatment.  On review of systems she says that she has not had any of her spells or falls today. Doesn't have any other new physical problems. Mood as mentioned feels good and she denies any psychiatric symptoms.  Patient was alert and oriented awake and cooperative. Eye contact was good. Psychomotor activity normal. Affect euthymic and reactive. Thoughts lucid. No suicidal thoughts. Good insight and judgment.  Plan is for continuing current medication including the mirtazapine at 15 mg at night. No change to psychiatric medicine. Supportive counseling completed. I will not be here tomorrow. If consultation and assistance is required please contact [place]. Thank you

## 2015-08-27 NOTE — Progress Notes (Signed)
Pt discharged home via wheelchair. IV discontinued without incident. Home medication list and follow-up appointment information given. No questions verbalized.

## 2015-08-27 NOTE — Telephone Encounter (Signed)
Per Lindsay [name] calling stating pt will need to be on a 30 day monitor Orders are in  Just wanted to make sure we order it for pt.  She will be coming to office on 09/04/15 to see [name].

## 2015-08-28 NOTE — Telephone Encounter (Signed)
Pt has been enrolled to [employer] for 30 day monitor.

## 2015-08-30 LAB — CULTURE, BLOOD (ROUTINE X 2)
CULTURE: NO GROWTH
Culture: NO GROWTH

## 2015-09-03 NOTE — Telephone Encounter (Signed)
Received fax from [place] that multiple attempts were made by them to contact office to confirm address. They were unable to reach pt and cannot mail 30 day monitor to her.  Will confirm address and phone # @ ov 09/04/15.

## 2015-09-04 ENCOUNTER — Encounter: Payer: Medicare Other | Admitting: Physician Assistant

## 2015-09-25 ENCOUNTER — Encounter: Payer: Self-pay | Admitting: Physician Assistant

## 2015-09-25 ENCOUNTER — Other Ambulatory Visit
Admission: RE | Admit: 2015-09-25 | Discharge: 2015-09-25 | Disposition: A | Discharge status: Home / Self Care | Payer: Medicare Other | Source: Ambulatory Visit | Attending: Physician Assistant | Admitting: Physician Assistant

## 2015-09-25 ENCOUNTER — Ambulatory Visit (INDEPENDENT_AMBULATORY_CARE_PROVIDER_SITE_OTHER): Payer: Medicare Other | Admitting: Physician Assistant

## 2015-09-25 ENCOUNTER — Ambulatory Visit
Admission: RE | Admit: 2015-09-25 | Discharge: 2015-09-25 | Disposition: A | Discharge status: Home / Self Care | Payer: Medicare Other | Source: Ambulatory Visit | Attending: Physician Assistant | Admitting: Physician Assistant

## 2015-09-25 ENCOUNTER — Ambulatory Visit (INDEPENDENT_AMBULATORY_CARE_PROVIDER_SITE_OTHER): Payer: Medicare Other

## 2015-09-25 VITALS — BP 110/78 | HR 59 | Ht 66.0 in | Wt 118.0 lb

## 2015-09-25 DIAGNOSIS — I429 Cardiomyopathy, unspecified: Secondary | ICD-10-CM | POA: Insufficient documentation

## 2015-09-25 DIAGNOSIS — I5022 Chronic systolic (congestive) heart failure: Secondary | ICD-10-CM | POA: Insufficient documentation

## 2015-09-25 DIAGNOSIS — I4891 Unspecified atrial fibrillation: Secondary | ICD-10-CM | POA: Diagnosis present

## 2015-09-25 DIAGNOSIS — D649 Anemia, unspecified: Secondary | ICD-10-CM | POA: Insufficient documentation

## 2015-09-25 DIAGNOSIS — F329 Major depressive disorder, single episode, unspecified: Secondary | ICD-10-CM | POA: Insufficient documentation

## 2015-09-25 DIAGNOSIS — I480 Paroxysmal atrial fibrillation: Secondary | ICD-10-CM | POA: Insufficient documentation

## 2015-09-25 DIAGNOSIS — R55 Syncope and collapse: Secondary | ICD-10-CM

## 2015-09-25 DIAGNOSIS — I639 Cerebral infarction, unspecified: Secondary | ICD-10-CM

## 2015-09-25 DIAGNOSIS — I951 Orthostatic hypotension: Secondary | ICD-10-CM | POA: Diagnosis not present

## 2015-09-25 DIAGNOSIS — I428 Other cardiomyopathies: Secondary | ICD-10-CM

## 2015-09-25 DIAGNOSIS — R079 Chest pain, unspecified: Secondary | ICD-10-CM | POA: Diagnosis present

## 2015-09-25 DIAGNOSIS — R29898 Other symptoms and signs involving the musculoskeletal system: Secondary | ICD-10-CM

## 2015-09-25 LAB — CBC
HEMATOCRIT: 32.6 % — AB (ref 35.0–47.0)
HEMOGLOBIN: 10.2 g/dL — AB (ref 12.0–16.0)
MCH: 27.8 pg (ref 26.0–34.0)
MCHC: 31.2 g/dL — AB (ref 32.0–36.0)
MCV: 89.1 fL (ref 80.0–100.0)
Platelets: 182 10*3/uL (ref 150–440)
RBC: 3.66 MIL/uL — ABNORMAL LOW (ref 3.80–5.20)
RDW: 16.9 % — AB (ref 11.5–14.5)
WBC: 6 10*3/uL (ref 3.6–11.0)

## 2015-09-25 LAB — BASIC METABOLIC PANEL
ANION GAP: 7 (ref 5–15)
BUN: 20 mg/dL (ref 6–20)
CALCIUM: 9.1 mg/dL (ref 8.9–10.3)
CO2: 23 mmol/L (ref 22–32)
Chloride: 112 mmol/L — ABNORMAL HIGH (ref 101–111)
Creatinine, Ser: 1.02 mg/dL — ABNORMAL HIGH (ref 0.44–1.00)
GFR calc Af Amer: >60 mL/min (ref >60)
GFR calc non Af Amer: 60 mL/min — ABNORMAL LOW (ref >60)
GLUCOSE: 97 mg/dL (ref 65–99)
POTASSIUM: 3.7 mmol/L (ref 3.5–5.1)
Sodium: 142 mmol/L (ref 135–145)

## 2015-09-25 LAB — DIGOXIN LEVEL: Digoxin Level: 0.4 ng/mL — ABNORMAL LOW (ref 0.8–2.0)

## 2015-09-25 NOTE — Progress Notes (Signed)
Cardiology Hospital Follow Up Note:   Date of Encounter: 09/25/2015  ID: Marlon Pel, DOB April 14, 1956, MRN [id number]  PCP: Pcp Not In System Primary Cardiologist: Dr. Fletcher Anon, MD  Chief Complaint  Patient presents with  . other    Follow up from [facility]; syncope & A-fib. "doing well." Meds reviewed by the patient verbally.      HPI:  59 year old female with history NICM by [place] [id number], chronic systolic CHF with prior EF 30-35% in 2011 improved to 50% in 05/2015, PAF not on long term full dose anticoagulation, history of breast cancer s/p chemotherapy (Docetaxol, cytoxan, adriamycin, and lumpectomy) in [id number] complicated by chronic neuropathy, postural hypotension, orthostatic syncope, sensorimotor neuropathy, history of acute renal injury, depression, chronic pain disorder, s/p IVC filter in 07/2013, chronic anemia, seizure disorder, s/p gastric bypass, and history of C diff infection who presents for hospital follow up after recent admission to [facility] from 10/16 to 10/20 with multiple episodes of recurrent syncope x 3 and was found to be in Afib with RVR and have orthostatic hypotension.   Patient was previously seen by Dr. Rockey Situ in 2013 for follow up of prior admissions to the hospital 2/2 Afib with RVR and syncope in December 2012 in which she was treated with Cardizem gtt and flecainide, converting to sinus rhythm. She was noted to have low blood pressure and given IV fluids. Her ACEi was held and she was started on Florinef at that time. Echo showed EF 40-45%, mild DD, normal RVSP. This was an improvement from study done in 2011 that showed an EF of 30-35%. In 2010 [facility] noted preserved LV function of 73%. It was felt her cardiomyopathy was 2/2 previous chemotherapy. She was continued on Florinef and followed up with her PCP for the past 3 years.   She was admitted to [facility] in 2014 for intractable nausea, toxic encephalopathy and seen by GI and neuro. She was found to have orthostatic  hypotension. EEG was normal. She was admitted again in 12/2014 for syncope felt to be 2/2 autonomic dysfunction. She had recently been taken off her midodrine and Florinef. She was restarted on them and did quite well.   She recently saw Dr. Rodena Piety, MD at [facility] at the request of her PCP given dull pressure along her chest occuring once per week at rest. She was scheduled for a nuclear stress test on 10/18.   She presented to [facility] on 10/16 after suffering 3 syncopal episodes while getting up to go to the door to let her nurses aid in. She initially got up around 10 AM when her phone rang to let her nurses aid in. She noticed at that time she had some tachy-palpitations. Upon standing she suffered a syncopal episode. She is not sure how long she was out. She eventually came to, got up, and attempted to go to the door again, though she suffered another syncopal episode. This time she did not feel any tachy-palpitations. Upon coming to she felt tachy-palpitations. When she stood and got to the door she passed out again, felt tachy-palpitations. A neighbor came out 2/2 hearing her dog barking, let in the nurses aid, and got her up to her room into her bed. She then had an uneventful morning without any further episodes. There was never any associated chest pain. There was some associated SOB. Upon her arrival to [facility] she was noted to be in Afib with RVR with heart rate in the 120's. BP was soft in the 90's/60's.  She did suffer another syncopal episode in the ED while in the bathroom with the nurse. Head CT was negative. MRI brain and carotid doppler were negative. Troponin negative x 4. SCr 1.01, K+ 4.0, HGB 10.2, WBC 8.4, CXR no active cardiopulmonary disease. On the morning of 10/17 she had another syncopal episode after using the [facility] prior to going down to EEG. Vitals stable. On telemetry she has been in Afib with RVR in the 120's to 160's. EEG was normal. She maintained sinus rhythm. ELexiscan was negative for  signifcant ischemia, EF 61%, no ECH changes concerning for ischemia with baseline abnormal T wave anterolateral leads, low risk study. Florinef was continued and midodrine was increased to 10 mg tid. Order was placed for 30 day event monitor, but [employer] could never reach her.     She has felt well since she was discharged from the hospital. No further syncopal episodes. No dizziness or lightheadedness. She is tolerating all of her medications without issues. She has not had any palpitations or felt like she has back in Afib with RVR such as she was upon admission to the hospital (see detailed cardiology note and opening paragraph in this note). She feels like her strength is beginning to come back to her. She has not started wearing the 30 day event monitor and does not plan to. Her only concern is about digoxin. She otherwise has no concerns today.     Past Medical History  Diagnosis Date  . HX: breast cancer     right  . Peripheral neuropathy ([place])   . Orthostatic hypotension   . Depression   . Vitamin D deficiency   . Anemia   . Depression   . Chronic systolic CHF (congestive heart failure) ([place])   . Muscle spasm   . Chronic cough   . C. difficile diarrhea 10/2011  . Kidney stones 2013  . Cancer ([place])   . PAF (paroxysmal atrial fibrillation) ([place])   : Past Surgical History  Procedure Laterality Date  . Breast lumpectomy      right @ [facility]  . Lithotripsy  jan 2013  . Esophagogastroduodenoscopy (egd) with propofol N/A 05/25/2015    Procedure: ESOPHAGOGASTRODUODENOSCOPY (EGD) WITH PROPOFOL;  Surgeon: Manya Silvas, MD;  Location: [place];  Service: Endoscopy;  Laterality: N/A;  . Abdominal hysterectomy    : Family History  Problem Relation Age of Onset  . Heart attack Father   :  reports that she quit smoking about 29 years ago. Her smoking use included Cigarettes. She has a .25 pack-year smoking history. She does not have any smokeless tobacco history on file. She  reports that she does not drink alcohol or use illicit drugs.:   Allergies:  No Known Allergies   Home Medications:  Current Outpatient Prescriptions  Medication Sig Dispense Refill  . anastrozole (ARIMIDEX) 1 MG tablet Take 1 mg by mouth daily.      Marland Kitchen aspirin EC 81 MG tablet Take 81 mg by mouth daily.    . cefUROXime (CEFTIN) 250 MG tablet Take 1 tablet (250 mg total) by mouth 2 (two) times daily with a meal. 10 tablet 0  . Cholecalciferol 10000 UNITS CAPS Take 1,000 Units by mouth daily.    . clonazePAM (KLONOPIN) 0.5 MG tablet Take 0.5 mg by mouth at bedtime.     . digoxin (LANOXIN) 0.125 MG tablet Take 0.125 mg by mouth daily.    . ferrous sulfate 325 (65 FE) MG tablet Take 325 mg by  mouth daily with breakfast.      . flecainide (TAMBOCOR) 100 MG tablet Take 0.5 tablets (50 mg total) by mouth every 12 (twelve) hours. 60 tablet 0  . fludrocortisone (FLORINEF) 0.1 MG tablet Take 0.2 mg by mouth daily.    . fluticasone (FLONASE) 50 MCG/ACT nasal spray Place 1 spray into the nose daily.     Marland Kitchen lidocaine (LIDODERM) 5 % Place 1 patch onto the skin daily. Remove & Discard patch within 12 hours or as directed by MD    . loperamide (IMODIUM) 2 MG capsule Take 2 mg by mouth 4 (four) times daily as needed for diarrhea or loose stools.    Marland Kitchen loratadine (CLARITIN) 10 MG tablet Take 10 mg by mouth daily as needed for allergies.     . midodrine (PROAMATINE) 10 MG tablet Take 1 tablet (10 mg total) by mouth 3 (three) times daily with meals. 90 tablet 0  . mirtazapine (REMERON) 15 MG tablet Take 15 mg by mouth at bedtime.      Marland Kitchen morphine (MS CONTIN) 15 MG 12 hr tablet Take 15 mg by mouth every 8 (eight) hours.    Marland Kitchen omeprazole (PRILOSEC) 40 MG capsule Take 40 mg by mouth daily.    Marland Kitchen oxyCODONE (ROXICODONE) 15 MG immediate release tablet Take 15 mg by mouth every 6 (six) hours as needed for pain.    . potassium chloride (K-DUR) 10 MEQ tablet Take 20 mEq by mouth 2 (two) times daily.    . pregabalin  (LYRICA) 150 MG capsule Take 150 mg by mouth 3 (three) times daily.    . sertraline (ZOLOFT) 50 MG tablet Take 50 mg by mouth daily.    Marland Kitchen spironolactone (ALDACTONE) 25 MG tablet Take 25 mg by mouth daily.    . tamsulosin (FLOMAX) 0.4 MG CAPS capsule Take 1 capsule (0.4 mg total) by mouth daily. 30 capsule 1  . tiZANidine (ZANAFLEX) 2 MG tablet Take 2 mg by mouth 3 (three) times daily.    . vitamin B-12 (CYANOCOBALAMIN) 1000 MCG tablet Take 1,000 mcg by mouth daily.     . vitamin C (ASCORBIC ACID) 500 MG tablet Take 500 mg by mouth daily.     No current facility-administered medications for this visit.     Review of Systems:  Review of Systems  Constitutional: Positive for malaise/fatigue. Negative for fever, chills, weight loss and diaphoresis.       Cold easily   HENT: Negative for congestion.   Eyes: Negative for discharge and redness.  Respiratory: Negative for cough, hemoptysis, sputum production, shortness of breath and wheezing.   Cardiovascular: Negative for chest pain, palpitations, orthopnea, claudication, leg swelling and PND.  Gastrointestinal: Negative for nausea and vomiting.  Skin: Negative for rash.  Neurological: Negative for dizziness, tingling, tremors, sensory change, speech change, focal weakness, seizures, loss of consciousness and weakness.  Endo/Heme/Allergies: Does not bruise/bleed easily.  Psychiatric/Behavioral: Negative for substance abuse. The patient is not nervous/anxious.      Physical Exam:  Blood pressure 110/78, pulse 59, height 5\' 6"  (1.676 m), weight 118 lb (53.524 kg). BMI: Body mass index is 19.05 kg/(m^2). General: Pleasant, NAD. Psych: Normal affect. Responds to questions with normal affect.  Neuro: Alert and oriented X 3. Moves all extremities spontaneously. HEENT: Normocephalic, atraumatic. EOM intact bilaterally. Sclera anicteric.  Neck: Trachea midline. Supple without bruits or JVD. Lungs:  Respirations regular and unlabored, CTA  bilaterally without wheezing, crackles, or rhonchi.  Heart: RRR, normal s3, s4. No murmurs, rubs,  or gallops.  Abdomen: Soft, non-tender, non-distended, BS + x 4.  Extremities: No clubbing, cyanosis or edema. DP/PT/Radials 2+ and equal bilaterally.   Accessory Clinical Findings:  EKG: sinus bradycardia. 59 bpm, nonspecific st/t changes along inferolateral leads   Orthostatic vital signs: Lying: 173/101, 56 bpm  Sitting: 150/88, 58 bpm Standing: 137/85, 57 bpm Standing x 3 min: 127/81, 62 bpm  Recent Labs: 05/24/2015: ALT 6* 08/26/2015: Hemoglobin 8.8*; Platelets 138* 08/27/2015: BUN 17; Creatinine, Ser 0.94; Potassium 3.8; Sodium 140  No results found for: CHOL, TRIG, HDL, CHOLHDL, VLDL, LDLCALC, LDLDIRECT  Weights: Wt Readings from Last 3 Encounters:  09/25/15 118 lb (53.524 kg)  08/23/15 119 lb (53.978 kg)  05/23/15 114 lb 14.4 oz (52.118 kg)    CrCl cannot be calculated (Patient has no serum creatinine result on file.).   Other studies Reviewed: Additional studies/ records that were reviewed today include: [facility] admission.  Assessment & Plan:  1. History of syncope:  -Doing well since her discharge -Likely in the setting of orthostasis -She is orthostatic in the office today, though asymptomatic  -Increase PO fluid consumption, she does not want any medication adjustments at this time as she is feeling well -Take midodrine 10 mg q AM, 5-10 mg at lunch, and 5-10 mg in the evening -Continue Florinef 0.2 mg daily -48 hour Holter, she will not wear a 30 day event monitor   2. PAF:  -Maintaining sinus rhythm with a slightly bradycardic rate of 59 bpm -Asymptomatic -Patient was discharged on digoxin because she in fact did have Afib with RVR upon admission. This was clearly stated in the cardiology consult note. She could not be placed on other rate limiting medications 2/2 her orthostatic hypotension -Digoxin level of 0.7 on 09/11/2015 through PCP office in the setting of  normal renal function, I have rechecked a level today -May consider decreasing in the future  -Continue flecainide 50 mg bid -Discussed long term full dose anticoagulation with her and risk of stroke in Afib. She does not wish to go on full dose anticoagulation at this time -CHADS2VASc at least 2 (prior CHF and female), giving her an estimated annual stroke risk of 2.2%  3. NICM/chronic systolic CHF:  -She does not appear to be volume overloaded on exam today -Normal EF by nuclear stress test during the above admission -Her history of significant orthostatic hypotension with associated syncope has precluded the addition of beta blocker/ACEi/ARB/ANRI/spiro. If able to start these in the future would titrate slowly    4. Orthostatic hypotension:  -Asymptomatic today -Per #1   5. Weakness/amlnutrition:  -Improving -Supplement with Ensure/Glucerna -Per PCP  6. Anemia: -Check CBC -Defer management to PCP   Dispo: -Follow up 2 months  Current medicines are reviewed at length with the patient today.  The patient did not have any concerns regarding medicines.   Christell Faith, PA-C [place] [address] [place], [place] [address] [phone] [facility] 09/25/2015, 4:45 PM

## 2015-09-25 NOTE — Patient Instructions (Addendum)
Medication Instructions:  Your physician recommends that you continue on your current medications as directed. Please refer to the Current Medication list given to you today.   Labwork: BMET, digoxin level, cbc  Testing/Procedures: Your physician has recommended that you wear a holter monitor. Holter monitors are medical devices that record the heart's electrical activity. Doctors most often use these monitors to diagnose arrhythmias. Arrhythmias are problems with the speed or rhythm of the heartbeat. The monitor is a small, portable device. You can wear one while you do your normal daily activities. This is usually used to diagnose what is causing palpitations/syncope (passing out).    Follow-Up: Your physician recommends that you schedule a follow-up appointment in 4-6 weeks with Christell Faith, PA-C   Any Other Special Instructions Will Be Listed Below (If Applicable).     If you need a refill on your cardiac medications before your next appointment, please call your pharmacy.  Cardiac Event Monitoring A cardiac event monitor is a small recording device used to help detect abnormal heart rhythms (arrhythmias). The monitor is used to record heart rhythm when noticeable symptoms such as the following occur:  Fast heartbeats (palpitations), such as heart racing or fluttering.  Dizziness.  Fainting or light-headedness.  Unexplained weakness. The monitor is wired to two electrodes placed on your chest. Electrodes are flat, sticky disks that attach to your skin. The monitor can be worn for up to 30 days. You will wear the monitor at all times, except when bathing.  HOW TO USE YOUR CARDIAC EVENT MONITOR A technician will prepare your chest for the electrode placement. The technician will show you how to place the electrodes, how to work the monitor, and how to replace the batteries. Take time to practice using the monitor before you leave the office. Make sure you understand how to send the  information from the monitor to your health care provider. This requires a telephone with a landline, not a cell phone. You need to:  Wear your monitor at all times, except when you are in water:  Do not get the monitor wet.  Take the monitor off when bathing. Do not swim or use a hot tub with it on.  Keep your skin clean. Do not put body lotion or moisturizer on your chest.  Change the electrodes daily or any time they stop sticking to your skin. You might need to use tape to keep them on.  It is possible that your skin under the electrodes could become irritated. To keep this from happening, try to put the electrodes in slightly different places on your chest. However, they must remain in the area under your left breast and in the upper right section of your chest.  Make sure the monitor is safely clipped to your clothing or in a location close to your body that your health care provider recommends.  Press the button to record when you feel symptoms of heart trouble, such as dizziness, weakness, light-headedness, palpitations, thumping, shortness of breath, unexplained weakness, or a fluttering or racing heart. The monitor is always on and records what happened slightly before you pressed the button, so do not worry about being too late to get good information.  Keep a diary of your activities, such as walking, doing chores, and taking medicine. It is especially important to note what you were doing when you pushed the button to record your symptoms. This will help your health care provider determine what might be contributing to your symptoms. The  information stored in your monitor will be reviewed by your health care provider alongside your diary entries.  Send the recorded information as recommended by your health care provider. It is important to understand that it will take some time for your health care provider to process the results.  Change the batteries as recommended by your health  care provider. SEEK IMMEDIATE MEDICAL CARE IF:   You have chest pain.  You have extreme difficulty breathing or shortness of breath.  You develop a very fast heartbeat that persists.  You develop dizziness that does not go away.  You faint or constantly feel you are about to faint.   This information is not intended to replace advice given to you by your health care provider. Make sure you discuss any questions you have with your health care provider.   Document Released: 08/02/2008 Document Revised: 11/14/2014 Document Reviewed: 04/22/2013 Elsevier Interactive Patient Education [employer].

## 2015-11-06 ENCOUNTER — Encounter: Payer: Self-pay | Admitting: Physician Assistant

## 2015-11-06 ENCOUNTER — Other Ambulatory Visit
Admission: RE | Admit: 2015-11-06 | Discharge: 2015-11-06 | Disposition: A | Discharge status: Home / Self Care | Payer: Medicare Other | Source: Ambulatory Visit | Attending: Physician Assistant | Admitting: Physician Assistant

## 2015-11-06 ENCOUNTER — Ambulatory Visit (INDEPENDENT_AMBULATORY_CARE_PROVIDER_SITE_OTHER): Payer: Medicare Other | Admitting: Physician Assistant

## 2015-11-06 VITALS — BP 132/84 | HR 66 | Ht 66.0 in | Wt 128.5 lb

## 2015-11-06 DIAGNOSIS — I951 Orthostatic hypotension: Secondary | ICD-10-CM | POA: Insufficient documentation

## 2015-11-06 DIAGNOSIS — I4891 Unspecified atrial fibrillation: Secondary | ICD-10-CM | POA: Diagnosis present

## 2015-11-06 DIAGNOSIS — E46 Unspecified protein-calorie malnutrition: Secondary | ICD-10-CM | POA: Insufficient documentation

## 2015-11-06 DIAGNOSIS — G6289 Other specified polyneuropathies: Secondary | ICD-10-CM

## 2015-11-06 DIAGNOSIS — I428 Other cardiomyopathies: Secondary | ICD-10-CM

## 2015-11-06 DIAGNOSIS — M797 Fibromyalgia: Secondary | ICD-10-CM | POA: Insufficient documentation

## 2015-11-06 DIAGNOSIS — G629 Polyneuropathy, unspecified: Secondary | ICD-10-CM | POA: Insufficient documentation

## 2015-11-06 DIAGNOSIS — I429 Cardiomyopathy, unspecified: Secondary | ICD-10-CM

## 2015-11-06 DIAGNOSIS — I639 Cerebral infarction, unspecified: Secondary | ICD-10-CM

## 2015-11-06 DIAGNOSIS — I480 Paroxysmal atrial fibrillation: Secondary | ICD-10-CM | POA: Diagnosis not present

## 2015-11-06 DIAGNOSIS — I340 Nonrheumatic mitral (valve) insufficiency: Secondary | ICD-10-CM | POA: Insufficient documentation

## 2015-11-06 DIAGNOSIS — I5022 Chronic systolic (congestive) heart failure: Secondary | ICD-10-CM

## 2015-11-06 DIAGNOSIS — Z9884 Bariatric surgery status: Secondary | ICD-10-CM | POA: Insufficient documentation

## 2015-11-06 LAB — CBC WITH DIFFERENTIAL/PLATELET
BASOS ABS: 0 10*3/uL (ref 0–0.1)
Basophils Relative: 1 %
EOS ABS: 0.2 10*3/uL (ref 0–0.7)
EOS PCT: 2 %
HCT: 30 % — ABNORMAL LOW (ref 35.0–47.0)
Hemoglobin: 9.6 g/dL — ABNORMAL LOW (ref 12.0–16.0)
LYMPHS PCT: 18 %
Lymphs Abs: 1.8 10*3/uL (ref 1.0–3.6)
MCH: 28.5 pg (ref 26.0–34.0)
MCHC: 32 g/dL (ref 32.0–36.0)
MCV: 89.1 fL (ref 80.0–100.0)
Monocytes Absolute: 0.8 10*3/uL (ref 0.2–0.9)
Monocytes Relative: 8 %
NEUTROS PCT: 71 %
Neutro Abs: 7.1 10*3/uL — ABNORMAL HIGH (ref 1.4–6.5)
PLATELETS: 182 10*3/uL (ref 150–440)
RBC: 3.37 MIL/uL — AB (ref 3.80–5.20)
RDW: 16.1 % — ABNORMAL HIGH (ref 11.5–14.5)
WBC: 9.9 10*3/uL (ref 3.6–11.0)

## 2015-11-06 LAB — BASIC METABOLIC PANEL
Anion gap: 4 — ABNORMAL LOW (ref 5–15)
BUN: 24 mg/dL — AB (ref 6–20)
CO2: 26 mmol/L (ref 22–32)
CREATININE: 1.07 mg/dL — AB (ref 0.44–1.00)
Calcium: 9.9 mg/dL (ref 8.9–10.3)
Chloride: 112 mmol/L — ABNORMAL HIGH (ref 101–111)
GFR, EST NON AFRICAN AMERICAN: 57 mL/min — AB (ref >60)
Glucose, Bld: 83 mg/dL (ref 65–99)
POTASSIUM: 4.5 mmol/L (ref 3.5–5.1)
SODIUM: 142 mmol/L (ref 135–145)

## 2015-11-06 LAB — BRAIN NATRIURETIC PEPTIDE: B NATRIURETIC PEPTIDE 5: 60 pg/mL (ref 0.0–100.0)

## 2015-11-06 LAB — MAGNESIUM: MAGNESIUM: 2 mg/dL (ref 1.7–2.4)

## 2015-11-06 NOTE — Progress Notes (Signed)
Cardiology Office Note:  Date of Encounter: 11/06/2015  ID: Marlon Pel, DOB 08-Jan-1956, MRN [id number]  PCP:  Dr. Noe Gens, MD ([place]) Pain Clinic: Dr. Dorcas Carrow, MD & Delorse Lek, [place]  Primary Cardiologist:  Dr. Rockey Situ, MD  Chief Complaint  Patient presents with  . other    6 week f/u.  Pt was hospitalized in [place] on 10/30/15 due to anaphylactic shock after eating a shrimp.  Pt c/o chronic diarrhea.     HPI:  59 year old female with history NICM by [place] [id number], chronic systolic CHF with prior EF 30-35% in 2011 improved to 50% in 05/2015, PAF previously on warfarin but discontinued in 2011 secondary to elevated INR possibly in the setting of malnutrition and has not been on long term full dose anticoagulation since, history of breast cancer s/p chemotherapy (Docetaxol, cytoxan, adriamycin, and lumpectomy) in [id number] complicated by chronic neuropathy, postural hypotension, orthostatic syncope, sensorimotor neuropathy, history of acute renal injury, depression, chronic pain disorder, s/p IVC filter in 07/2013 secondary to DVT, anemia of chronic disease, seizure disorder, and s/p gastric bypass who presents for routine follow up of her NICM, syncope, PAF, and orthostatic hypotension.   Patient was previously seen by Dr. Rockey Situ in 2013 for follow up of prior admissions to the hospital 2/2 Afib with RVR and syncope in December 2012 in which she was treated with Cardizem gtt and flecainide, converting to sinus rhythm. She was noted to have low blood pressure and given IV fluids. Her ACEi was held and she was started on Florinef at that time. Echo showed EF 40-45%, mild DD, normal RVSP. This was an improvement from study done in 2011 that showed an EF of 30-35%. In 2010 [facility] noted preserved LV function of 73%. It was felt her cardiomyopathy was 2/2 previous chemotherapy. She was continued on Florinef and followed up with her PCP for the past 3 years.   She was admitted to  [facility] in 2014 for intractable nausea, toxic encephalopathy and seen by GI and neuro. She was found to have orthostatic hypotension. EEG was normal. She was admitted again in 12/2014 for syncope felt to be 2/2 autonomic dysfunction. She had recently been taken off her midodrine and Florinef by outside provider. She was restarted on them and did quite well.   She saw Dr. Rodena Piety, MD at [facility] in the fall of 2016 at the request of her PCP given dull pressure along her chest occuring once per week at rest. She was scheduled for a nuclear stress test on 10/18. Unfortunately, this could not be completed as she was admitted to [facility] as below.   She was admitted to [facility] on 10/16 after suffering 3 syncopal episodes while getting up to go to the door to let her nurse aid in with associated tachy-palpitations. A neighbor came out 2/2 hearing her dog barking, let in the nurse aid, and got her up to her room into her bed. There was never any associated chest pain. Upon her arrival to [facility] she was noted to be in Afib with RVR with heart rate in the 120's. BP was soft in the 90's/60's. She did suffer another syncopal episode in the ED while in the bathroom with the nurse. Head CT was negative. MRI brain and carotid doppler were negative. Troponin negative x 4. CXR negative. On the morning of 10/17 she had another syncopal episode after using the [facility] prior to going down to EEG, which was normal. She converted to sinus rhythm on  her own. Lexiscan was negative for signifcant ischemia, EF 61%, no ECG changes concerning for ischemia with baseline abnormal T wave anterolateral leads, low risk study. Florinef was continued and midodrine was increased to 10 mg tid.    At hospital follow up on 11/18 she continued to feel well. No further syncopal episodes, dizziness or lightheadedness. She was tolerating all of her medications without issues. She has not had any palpitations. She did not start 30 day event monitor as [employer] could  not get in touch with her. She preferred to wear a shorter duration monitor, thus a 48 hour Holter was placed that showed sinus rhythm, rare PACs, no significant PVCs, periods of sinus tachycarida with average heart rate typically in the 60 to 100 rage. No other significant arrhythmia noted. No Afib noted.   She followed up with her PCP on 12/16 with myalgias. LFTs were ok. CK was normal at 50. It was suspected she had a virus which improved with muscle relaxer. She was also advised to stop her digoxin. Her digoxin level had gone from 0.4 on 11/18 to 1.1 on 12/16. She has been continued on Flecainide 50 mg bid. She was subsequently hospitalized in [place] on 12/23 after eating a shrimp with question of anaphylactic shock. However, she denies having any angioedema or rash. She reports waking up in the middle of the night s/p shrimp with chest pain, SOB. She was found to be hypotensive with BP 60/42, pulse 80. ECG showed sinus rhythm, 64 bpm, nonspecific st/t changes. She left AMA.   In follow up she has felt well. Her weight is up 10 pounds today at 128 from 118 at her last OV in mid November. She reports eating well, "but not that good." She notes trace LEE. She denies any chest pain, tachy-palpitations, nausea, vomiting, presyncope, or syncope. She only gets lightheaded if she stands too quickly. No orthopnea, PND, or increased cough. She also complains of long standing peripheral neuropathy along her upper and lower extremities. She points out burn marks along her left hand from decreased sensation. She is taking her Midodrine 5 mg q AM, and 10 mg bid, this seems to be working well for her.     Past Medical History  Diagnosis Date  . HX: breast cancer     a. right-side; b. s/p lumpectomy & chemo w/ Docetaxol, cytoxan, adriamycin  . Peripheral neuropathy (HCC)     a. secondary to chemo  . Orthostatic hypotension   . Depression   . Vitamin D deficiency   . Anemia of chronic disease   . Depression     . Chronic systolic CHF (congestive heart failure) ([place])     a. echo 05/2015: EF 45-50%, nl wall motion, mod MR, nl PASP  . Muscle spasm   . Chronic cough   . C. difficile diarrhea 10/2011  . Kidney stones 2013  . PAF (paroxysmal atrial fibrillation) ([place])     a. not on long term, full-dose anticoagulation since 2011, had high INR at that time 2/2 possible malnutition, has been off anticoagulation since; b. CHADS2VASc at least 2 (CHF, female); c. 48 hr Holter 09/2015 NSR w/ rare PACs, no significant PVCs, periods of sinus tachycarida with average heart rate typically in the 60-100 range. No sig arrhythmia. No Afib seen.   Marland Kitchen NICM (nonischemic cardiomyopathy) ([name])     a. Lexiscan 08/2015: no sig ischemia, no ECG changes concerning for ischemia (baseline abnormal T wave along anterolateral leads), EF 61%, low risk scan  .  Moderate mitral regurgitation   . Postural hypotension   . History of DVT (deep vein thrombosis)     a. s/p IVC filter 2014  . Fibromyalgia   . S/P gastric bypass   . Malnutrition ([place])     a. chronic diarrhea s/p most meals   :  Past Surgical History  Procedure Laterality Date  . Breast lumpectomy      right @ [facility]  . Lithotripsy  jan 2013  . Esophagogastroduodenoscopy (egd) with propofol N/A 05/25/2015    Procedure: ESOPHAGOGASTRODUODENOSCOPY (EGD) WITH PROPOFOL;  Surgeon: Manya Silvas, MD;  Location: [place];  Service: Endoscopy;  Laterality: N/A;  . Abdominal hysterectomy    :  Social History:  The patient  reports that she quit smoking about 30 years ago. Her smoking use included Cigarettes. She has a .25 pack-year smoking history. She does not have any smokeless tobacco history on file. She reports that she does not drink alcohol or use illicit drugs.   Family History  Problem Relation Age of Onset  . CAD Father     MI  . Diabetes Mellitus II Father   . Hypertension Father   . Diabetes Mellitus II Sister      Allergies:  Allergies  Allergen  Reactions  . Shrimp [Shellfish Allergy] Anaphylaxis     Home Medications:  Current Outpatient Prescriptions  Medication Sig Dispense Refill  . anastrozole (ARIMIDEX) 1 MG tablet Take 1 mg by mouth daily.      Marland Kitchen aspirin EC 81 MG tablet Take 81 mg by mouth daily.    . cefUROXime (CEFTIN) 250 MG tablet Take 1 tablet (250 mg total) by mouth 2 (two) times daily with a meal. (Patient not taking: Reported on 11/06/2015) 10 tablet 0  . Cholecalciferol 10000 UNITS CAPS Take 1,000 Units by mouth daily.    . clonazePAM (KLONOPIN) 0.5 MG tablet Take 0.5 mg by mouth at bedtime.     . ferrous sulfate 325 (65 FE) MG tablet Take 325 mg by mouth daily with breakfast. Reported on 11/06/2015    . flecainide (TAMBOCOR) 100 MG tablet Take 0.5 tablets (50 mg total) by mouth every 12 (twelve) hours. 60 tablet 0  . fludrocortisone (FLORINEF) 0.1 MG tablet Take 0.2 mg by mouth daily.    . fluticasone (FLONASE) 50 MCG/ACT nasal spray Place 1 spray into the nose daily.     Marland Kitchen lidocaine (LIDODERM) 5 % Place 1 patch onto the skin daily. Remove & Discard patch within 12 hours or as directed by MD    . loperamide (IMODIUM) 2 MG capsule Take 2 mg by mouth 4 (four) times daily as needed for diarrhea or loose stools.    Marland Kitchen loratadine (CLARITIN) 10 MG tablet Take 10 mg by mouth daily as needed for allergies.     . midodrine (PROAMATINE) 10 MG tablet Take 1 tablet (10 mg total) by mouth 3 (three) times daily with meals. 90 tablet 0  . mirtazapine (REMERON) 15 MG tablet Take 15 mg by mouth at bedtime.      Marland Kitchen morphine (MS CONTIN) 15 MG 12 hr tablet Take 15 mg by mouth every 8 (eight) hours.    Marland Kitchen omeprazole (PRILOSEC) 40 MG capsule Take 40 mg by mouth daily.    Marland Kitchen oxyCODONE (ROXICODONE) 15 MG immediate release tablet Take 15 mg by mouth every 6 (six) hours as needed for pain.    . potassium chloride (K-DUR) 10 MEQ tablet Take 20 mEq by mouth 2 (two) times  daily.    . pregabalin (LYRICA) 150 MG capsule Take 150 mg by mouth 3  (three) times daily.    . sertraline (ZOLOFT) 50 MG tablet Take 50 mg by mouth daily.    Marland Kitchen spironolactone (ALDACTONE) 25 MG tablet Take 25 mg by mouth daily.    . tamsulosin (FLOMAX) 0.4 MG CAPS capsule Take 1 capsule (0.4 mg total) by mouth daily. (Patient not taking: Reported on 11/06/2015) 30 capsule 1  . tiZANidine (ZANAFLEX) 2 MG tablet Take 2 mg by mouth 3 (three) times daily.    . vitamin B-12 (CYANOCOBALAMIN) 1000 MCG tablet Take 1,000 mcg by mouth daily.     . vitamin C (ASCORBIC ACID) 500 MG tablet Take 500 mg by mouth daily.     No current facility-administered medications for this visit.     Review of Systems:  Review of Systems  Constitutional: Positive for malaise/fatigue. Negative for fever, chills, weight loss and diaphoresis.  HENT: Negative for congestion.   Eyes: Negative for discharge and redness.  Respiratory: Positive for shortness of breath. Negative for cough, hemoptysis, sputum production and wheezing.   Cardiovascular: Positive for leg swelling. Negative for chest pain, palpitations, orthopnea, claudication and PND.  Gastrointestinal: Positive for diarrhea. Negative for nausea, vomiting, abdominal pain, constipation, blood in stool and melena.  Musculoskeletal: Positive for myalgias and joint pain. Negative for back pain, falls and neck pain.  Skin: Negative for rash.  Neurological: Positive for dizziness, tingling, tremors, sensory change and weakness. Negative for speech change, focal weakness, seizures and loss of consciousness.  Endo/Heme/Allergies: Does not bruise/bleed easily.  Psychiatric/Behavioral: The patient is not nervous/anxious.      Physical Exam:   Blood pressure 132/84, pulse 66, height 5\' 6"  (1.676 m), weight 128 lb 8 oz (58.287 kg). BMI: Body mass index is 20.75 kg/(m^2). General: Pleasant, NAD. Psych: Normal affect. Responds to questions with normal affect.  Neuro: Alert and oriented X 3. Moves all extremities spontaneously. HEENT:  Normocephalic, atraumatic. EOM intact. Sclera anicteric.  Neck: Trachea midline. Supple without bruits or JVD. Lungs:  Respirations regular and unlabored. CTA bilaterally without wheezing, crackles, or rhonchi.  Heart: RRR, normal s3, s4. II/VI systolic murmurs along the apex. No rubs, or gallops.  Abdomen: Soft, non-tender, non-distended, BS + x 4.  Extremities: No clubbing or cyanosis. Trace non-pitting pre-tibial edema to the mid shin bilaterally. DP/PT/Radials 2+ and equal bilaterally. Multiple superficial, well healing burns along the left hand.    Accessory Clinical Findings:  EKG: NSR, 61 bpm, prior septal infarct, otherwise nonspecific st/t changes   Recent Labs: 05/24/2015: ALT 6* 09/25/2015: BUN 20; Creatinine, Ser 1.02*; Hemoglobin 10.2*; Platelets 182; Potassium 3.7; Sodium 142   Weights: Wt Readings from Last 3 Encounters:  11/06/15 128 lb 8 oz (58.287 kg)  09/25/15 118 lb (53.524 kg)  08/23/15 119 lb (53.978 kg)    Other studies Reviewed: Additional studies/ records that were reviewed today include: cardiology and care everywhere notes/data.  Assessment & Plan:  1. PAF:  -It is currently unclear why the notes on 09/09/2015 and 10/23/2015 in [place] indicate the patient did not have Afib with RVR during her hospital admission. Review of patient's chart, ECG's, and cardiology consult reveal the patient presented to the hospital with Afib with RVR with a heart rate of 117 on 12 lead ECG taken in the ED. She was subsequently rate controlled, in Afib at the time of her cardiology consult, and ultimately converted to sinus rhythm during her admission. Copies  of her ECG's taken during the above admission were given to the patient to give for review at her next follow up.  -The patient is currently in sinus rhythm  -Agree with stopping digoxin on 12/16 as her level had gone from 0.4 on 11/18 to 1.1 on 12/16 on 0.125 mg  -Check digoxin level  -Continue Flecainide 50  mg bid (recent nonischemic nuclear stress test 08/2015)  -Check lytes, SCr, and Mg++. Recent labs from 12/16 show K+ 4.4, SCr 0.9, Mg++ 1.9  -Long discussion today regarding patient's anticoagulation status given her PAF, including risks and benefits of warfarin and NOACs. Recommend patient start long term, full-dose anticoagulation based on her CHADS2VASc below. She is more open to the idea at this time, however wants to discuss this with her PCP prior to moving forward as she has had a long relationship with them than her cardiologist. Advised patient her cardiology office would be the practice prescribing this medication. She is aware of stroke risk and will get back in touch with our office once she has made her decision regarding this matter -CHADS2VASc at least 2 (CHF and female), giving her an estimated annual stroke risk of 2.2%, per guidelines any score over 1 is high risk for possible embolization    2. NICM:  -She does not appear to be volume overloaded on exam today, though has a weight gain of 10 pounds when compared to her office visit on 11/18. She reports increased PO intake, but does not feel like she has eaten 10 pound worth of food, "maybe 5 pounds." -Normal EF during nuclear stress test  -She reports trace lower extremity edema over the past month, will check BNP and echo to assess LV systolic function and wall motion  -Continue spironolactone 25 mg  -Her history of significant orthostatic hypotension with associated syncope has precluded the addition of beta blocker/ACEi/ARB. If able to start these in the future would titrate slowly   3. Orthostatic hypotension/syncope:  -Doing well/asymptomatic  -Continue Midodrine 10 mg tid (5 mg q AM and 10 mg bid - patient's choice) -Florinef 0.2 mg daily    4. Recent atypical chest pain: -While in [place] in the setting of hypotension with BP 60s/40s s/p shrimp (first time ever eating). Occuring while at rest -Recent nonischemic, low  risk Lexiscan -No further symptoms -Should symptoms redevelop, could pursue further evaluation if indicated  -Would aim to maintain adequate BP  5. Weakness/malnutrition/chronic diarrhea: -Perhaps addition of Boost/Ensure would benefit  -Labs on 12/16 reviewed   -Per PCP   6. Anemia of chronic disease:  -HGB of 9.6 on 12/16, she was 10.2 on 11/18  -Maintain hgb of at least 8.5  -Perhaps patient would benefit from iron -Per PCP  7. Peripheral neuropathy: -Long standing issue -Multiple superficial, well healing burn marks on her left hand 2/2 lack of sensation  -Felt to be 2/2 chemo -Per PCP  8. Moderate mitral regurgitation: -Can be followed annually with echo    Dispo: -Follow up in 3 months with Dr. Rockey Situ, [place]  Current medicines are reviewed at length with the patient today.  The patient did not have any concerns regarding medicines.   Christell Faith, PA-C [address] [place], [place] [address] [phone] [place] 11/06/2015, 3:50 PM

## 2015-11-06 NOTE — Patient Instructions (Addendum)
Medication Instructions:  Please STOP digoxin Please continue flecainide  Labwork: BMET, BNP, CBC, Magnesium  Testing/Procedures: Your physician has requested that you have an echocardiogram. Echocardiography is a painless test that uses sound waves to create images of your heart. It provides your doctor with information about the size and shape of your heart and how well your heart's chambers and valves are working. This procedure takes approximately one hour. There are no restrictions for this procedure.  Date & time: _____________________________________________  Follow-Up: 3 months  Date & time: _____________________________________________  If you need a refill on your cardiac medications before your next appointment, please call your pharmacy.  Echocardiogram An echocardiogram, or echocardiography, uses sound waves (ultrasound) to produce an image of your heart. The echocardiogram is simple, painless, obtained within a short period of time, and offers valuable information to your health care provider. The images from an echocardiogram can provide information such as:  Evidence of coronary artery disease (CAD).  Heart size.  Heart muscle function.  Heart valve function.  Aneurysm detection.  Evidence of a past heart attack.  Fluid buildup around the heart.  Heart muscle thickening.  Assess heart valve function. LET [facility] CARE PROVIDER KNOW ABOUT:  Any allergies you have.  All medicines you are taking, including vitamins, herbs, eye drops, creams, and over-the-counter medicines.  Previous problems you or members of your family have had with the use of anesthetics.  Any blood disorders you have.  Previous surgeries you have had.  Medical conditions you have.  Possibility of pregnancy, if this applies. BEFORE THE PROCEDURE  No special preparation is needed. Eat and drink normally.  PROCEDURE   In order to produce an image of your heart, gel will be  applied to your chest and a wand-like tool (transducer) will be moved over your chest. The gel will help transmit the sound waves from the transducer. The sound waves will harmlessly bounce off your heart to allow the heart images to be captured in real-time motion. These images will then be recorded.  You may need an IV to receive a medicine that improves the quality of the pictures. AFTER THE PROCEDURE You may return to your normal schedule including diet, activities, and medicines, unless your health care provider tells you otherwise.   This information is not intended to replace advice given to you by your health care provider. Make sure you discuss any questions you have with your health care provider.   Document Released: 10/21/2000 Document Revised: 11/14/2014 Document Reviewed: 07/01/2013 Elsevier Interactive Patient Education [employer].

## 2015-11-12 ENCOUNTER — Encounter: Payer: Self-pay | Admitting: Physician Assistant

## 2015-11-16 ENCOUNTER — Other Ambulatory Visit: Payer: Medicare Other

## 2015-11-20 ENCOUNTER — Telehealth: Payer: Self-pay

## 2015-11-20 NOTE — Telephone Encounter (Signed)
l mom for pt to call and r/s echo that we had to cx due to weather.

## 2015-12-23 ENCOUNTER — Ambulatory Visit (INDEPENDENT_AMBULATORY_CARE_PROVIDER_SITE_OTHER): Payer: Medicare Other

## 2015-12-23 ENCOUNTER — Other Ambulatory Visit: Payer: Self-pay

## 2015-12-23 DIAGNOSIS — I4891 Unspecified atrial fibrillation: Secondary | ICD-10-CM

## 2016-02-03 ENCOUNTER — Emergency Department
Admission: EM | Admit: 2016-02-03 | Discharge: 2016-02-03 | Disposition: A | Discharge status: Home / Self Care | Payer: Medicare Other | Attending: Emergency Medicine | Admitting: Emergency Medicine

## 2016-02-03 ENCOUNTER — Emergency Department: Payer: Medicare Other

## 2016-02-03 DIAGNOSIS — R079 Chest pain, unspecified: Secondary | ICD-10-CM

## 2016-02-03 DIAGNOSIS — Z79899 Other long term (current) drug therapy: Secondary | ICD-10-CM | POA: Insufficient documentation

## 2016-02-03 DIAGNOSIS — I429 Cardiomyopathy, unspecified: Secondary | ICD-10-CM | POA: Insufficient documentation

## 2016-02-03 DIAGNOSIS — I5022 Chronic systolic (congestive) heart failure: Secondary | ICD-10-CM | POA: Diagnosis not present

## 2016-02-03 DIAGNOSIS — G629 Polyneuropathy, unspecified: Secondary | ICD-10-CM | POA: Diagnosis not present

## 2016-02-03 DIAGNOSIS — Z7982 Long term (current) use of aspirin: Secondary | ICD-10-CM | POA: Diagnosis not present

## 2016-02-03 DIAGNOSIS — R197 Diarrhea, unspecified: Secondary | ICD-10-CM | POA: Insufficient documentation

## 2016-02-03 DIAGNOSIS — F329 Major depressive disorder, single episode, unspecified: Secondary | ICD-10-CM | POA: Insufficient documentation

## 2016-02-03 DIAGNOSIS — R0789 Other chest pain: Secondary | ICD-10-CM | POA: Diagnosis present

## 2016-02-03 DIAGNOSIS — R112 Nausea with vomiting, unspecified: Secondary | ICD-10-CM | POA: Insufficient documentation

## 2016-02-03 DIAGNOSIS — R1013 Epigastric pain: Secondary | ICD-10-CM | POA: Diagnosis not present

## 2016-02-03 DIAGNOSIS — I951 Orthostatic hypotension: Secondary | ICD-10-CM | POA: Diagnosis not present

## 2016-02-03 DIAGNOSIS — I4891 Unspecified atrial fibrillation: Secondary | ICD-10-CM | POA: Diagnosis not present

## 2016-02-03 DIAGNOSIS — Z87891 Personal history of nicotine dependence: Secondary | ICD-10-CM | POA: Insufficient documentation

## 2016-02-03 DIAGNOSIS — Z853 Personal history of malignant neoplasm of breast: Secondary | ICD-10-CM | POA: Insufficient documentation

## 2016-02-03 DIAGNOSIS — R109 Unspecified abdominal pain: Secondary | ICD-10-CM

## 2016-02-03 LAB — CBC
HCT: 31 % — ABNORMAL LOW (ref 35.0–47.0)
Hemoglobin: 10.4 g/dL — ABNORMAL LOW (ref 12.0–16.0)
MCH: 29.5 pg (ref 26.0–34.0)
MCHC: 33.6 g/dL (ref 32.0–36.0)
MCV: 87.7 fL (ref 80.0–100.0)
PLATELETS: 186 10*3/uL (ref 150–440)
RBC: 3.54 MIL/uL — AB (ref 3.80–5.20)
RDW: 15.6 % — ABNORMAL HIGH (ref 11.5–14.5)
WBC: 6 10*3/uL (ref 3.6–11.0)

## 2016-02-03 LAB — RAPID INFLUENZA A&B ANTIGENS
Influenza A (ARMC): NEGATIVE
Influenza B (ARMC): NEGATIVE

## 2016-02-03 LAB — BASIC METABOLIC PANEL
ANION GAP: 4 — AB (ref 5–15)
BUN: 16 mg/dL (ref 6–20)
CO2: 22 mmol/L (ref 22–32)
Calcium: 9.6 mg/dL (ref 8.9–10.3)
Chloride: 114 mmol/L — ABNORMAL HIGH (ref 101–111)
Creatinine, Ser: 0.97 mg/dL (ref 0.44–1.00)
GLUCOSE: 100 mg/dL — AB (ref 65–99)
POTASSIUM: 3.8 mmol/L (ref 3.5–5.1)
Sodium: 140 mmol/L (ref 135–145)

## 2016-02-03 LAB — TROPONIN I

## 2016-02-03 LAB — LIPASE, BLOOD: LIPASE: 10 U/L — AB (ref 11–51)

## 2016-02-03 MED ORDER — ONDANSETRON HCL 4 MG/2ML IJ SOLN: 4.0000 mg | Freq: Once | INTRAMUSCULAR | Status: DC

## 2016-02-03 MED ORDER — ONDANSETRON HCL 4 MG/2ML IJ SOLN
4.0000 mg | Freq: Once | INTRAMUSCULAR | Status: AC
  Administered 2016-02-03: 4 mg via INTRAVENOUS
  Filled 2016-02-03: qty 2

## 2016-02-03 MED ORDER — BARIUM SULFATE 2.1 % PO SUSP
450.0000 mL | ORAL | Status: AC
  Administered 2016-02-03: 450 mL via ORAL

## 2016-02-03 MED ORDER — SODIUM CHLORIDE 0.9 % IV BOLUS (SEPSIS)
1000.0000 mL | Freq: Once | INTRAVENOUS | Status: AC
  Administered 2016-02-03: 1000 mL via INTRAVENOUS

## 2016-02-03 MED ORDER — FENTANYL CITRATE (PF) 100 MCG/2ML IJ SOLN
75.0000 ug | Freq: Once | INTRAMUSCULAR | Status: AC
  Administered 2016-02-03: 75 ug via INTRAVENOUS
  Filled 2016-02-03: qty 2

## 2016-02-03 MED ORDER — OXYCODONE HCL 5 MG PO TABS
15.0000 mg | ORAL_TABLET | Freq: Once | ORAL | Status: AC
  Administered 2016-02-03: 15 mg via ORAL
  Filled 2016-02-03: qty 3

## 2016-02-03 MED ORDER — CLONAZEPAM 0.5 MG PO TABS
0.5000 mg | ORAL_TABLET | Freq: Once | ORAL | Status: AC
  Administered 2016-02-03: 0.5 mg via ORAL
  Filled 2016-02-03: qty 1

## 2016-02-03 MED ORDER — FLECAINIDE ACETATE 100 MG PO TABS
100.0000 mg | ORAL_TABLET | Freq: Once | ORAL | Status: AC
  Administered 2016-02-03: 100 mg via ORAL
  Filled 2016-02-03: qty 1

## 2016-02-03 MED ORDER — MORPHINE SULFATE 15 MG PO TABS
15.0000 mg | ORAL_TABLET | ORAL | Status: DC | PRN
  Administered 2016-02-03: 15 mg via ORAL
  Filled 2016-02-03: qty 1

## 2016-02-03 MED ORDER — MORPHINE SULFATE (PF) 4 MG/ML IV SOLN
6.0000 mg | Freq: Once | INTRAVENOUS | Status: AC
  Administered 2016-02-03: 6 mg via INTRAVENOUS
  Filled 2016-02-03: qty 2

## 2016-02-03 MED ORDER — ONDANSETRON 4 MG PO TBDP
4.0000 mg | ORAL_TABLET | Freq: Once | ORAL | Status: AC
  Administered 2016-02-03: 4 mg via ORAL
  Filled 2016-02-03: qty 1

## 2016-02-03 MED ORDER — ONDANSETRON 4 MG PO TBDP: 4.0000 mg | ORAL_TABLET | Freq: Three times a day (TID) | ORAL | Status: DC | PRN

## 2016-02-03 NOTE — Discharge Instructions (Signed)
You may take Zofran for nausea and your chronic pain medications for your pain.  Please take a clear liquid diet for the next 24 hours, then advance to a bland BRAT diet as tolerated.  Return to the emergency department if you develop severe pain, lightheadedness or fainting, fever, chest pain or shortness of breath, or any other symptoms concerning to you.

## 2016-02-03 NOTE — ED Notes (Signed)
Pt arrived from home via EMS c/o chest pain. Per EMS pt has not been feeling well for a couple of days, reports nausea and vomiting as well as a history of cancer and neuropathy. Pt arrived grimacing & fidgeting in pain, alert and oriented, skin color and respirations WNL, no acute distress at this time.

## 2016-02-03 NOTE — ED Notes (Addendum)
Pt hypertensive, MD made aware. 

## 2016-02-03 NOTE — ED Provider Notes (Signed)
[facility] Emergency Department Provider Note  ____________________________________________  Time seen: Approximately 4:18 PM  I have reviewed the triage vital signs and the nursing notes.   HISTORY  Chief Complaint Chest Pain    HPI Lindsay [name] is a 60 y.o. female with a history of breast cancer, nonischemic cardiomyopathy and CHF, paroxysmal A. fib, history of DVT, S/P gastric bypass and hysterectomy, fibromyalgia, presenting with chest and abdominal pain, nausea without vomiting. Patient reports that since yesterday she has been having a "sharp and dull" central chest pain every 10 minutes that last for several seconds and resolves spontaneously. It is not associated with shortness of breath, palpitations or lightheadedness, syncope, or leg swelling. In addition, she reports a diffuse mostly upper abdominal pain that is constant and associated with nausea but no vomiting. Positive anorexia. Positive diarrhea. No fever or chills, urinary symptoms.   Past Medical History  Diagnosis Date  . HX: breast cancer     a. right-side; b. s/p lumpectomy & chemo w/ Docetaxol, cytoxan, adriamycin  . Peripheral neuropathy (HCC)     a. secondary to chemo  . Orthostatic hypotension   . Depression   . Vitamin D deficiency   . Anemia of chronic disease   . Depression   . Chronic systolic CHF (congestive heart failure) ([place])     a. echo 05/2015: EF 45-50%, nl wall motion, mod MR, nl PASP  . Muscle spasm   . Chronic cough   . C. difficile diarrhea 10/2011  . Kidney stones 2013  . PAF (paroxysmal atrial fibrillation) ([place])     a. not on long term, full-dose anticoagulation since 2011, had high INR at that time 2/2 possible malnutition, has been off anticoagulation since; b. CHADS2VASc at least 2 (CHF, female); c. 48 hr Holter 09/2015 NSR w/ rare PACs, no significant PVCs, periods of sinus tachycarida with average heart rate typically in the 60-100 range. No sig arrhythmia.  No Afib seen.   Marland Kitchen NICM (nonischemic cardiomyopathy) ([place])     a. Lexiscan 08/2015: no sig ischemia, no ECG changes concerning for ischemia (baseline abnormal T wave along anterolateral leads), EF 61%, low risk scan  . Moderate mitral regurgitation   . Postural hypotension   . History of DVT (deep vein thrombosis)     a. s/p IVC filter 2014  . Fibromyalgia   . S/P gastric bypass   . Malnutrition ([place])     a. chronic diarrhea s/p most meals     Patient Active Problem List   Diagnosis Date Noted  . Postural hypotension   . Moderate mitral regurgitation   . PAF (paroxysmal atrial fibrillation) ([place])   . Malnutrition ([place])   . S/P gastric bypass   . Fibromyalgia   . Peripheral neuropathy ([place])   . NICM (nonischemic cardiomyopathy) ([place])   . Chronic systolic CHF (congestive heart failure) ([place])   . Recurrent major depressive disorder, in partial remission ([place])   . Major depression ([place]) 08/26/2015  . Weakness of both legs   . Absolute anemia   . Pain in the chest   . Orthostatic hypotension   . Paroxysmal atrial fibrillation (HCC)   . Syncope and collapse   . Paroxysmal a-fib ([place])   . Atrial fibrillation ([place]) 05/24/2015  . Abdominal pain 05/23/2015  . C. difficile colitis 12/12/2011  . Chest pain 11/11/2011  . Syncope 11/11/2011  . Hypotension 11/11/2011  . Cardiomyopathy 11/11/2011  . AF (atrial fibrillation) ([place]) 11/11/2011    Past Surgical History  Procedure Laterality Date  . Breast lumpectomy      right @ [facility]  . Lithotripsy  jan 2013  . Esophagogastroduodenoscopy (egd) with propofol N/A 05/25/2015    Procedure: ESOPHAGOGASTRODUODENOSCOPY (EGD) WITH PROPOFOL;  Surgeon: Manya Silvas, MD;  Location: [place];  Service: Endoscopy;  Laterality: N/A;  . Abdominal hysterectomy      Current Outpatient Rx  Name  Route  Sig  Dispense  Refill  . anastrozole (ARIMIDEX) 1 MG tablet   Oral   Take 1 mg by mouth daily.           Marland Kitchen aspirin EC 81 MG tablet    Oral   Take 81 mg by mouth daily.         . cefUROXime (CEFTIN) 250 MG tablet   Oral   Take 1 tablet (250 mg total) by mouth 2 (two) times daily with a meal. Patient not taking: Reported on 11/06/2015   10 tablet   0   . Cholecalciferol 10000 UNITS CAPS   Oral   Take 1,000 Units by mouth daily.         . clonazePAM (KLONOPIN) 0.5 MG tablet   Oral   Take 0.5 mg by mouth at bedtime.          . ferrous sulfate 325 (65 FE) MG tablet   Oral   Take 325 mg by mouth daily with breakfast. Reported on 11/06/2015         . flecainide (TAMBOCOR) 100 MG tablet   Oral   Take 0.5 tablets (50 mg total) by mouth every 12 (twelve) hours.   60 tablet   0   . fludrocortisone (FLORINEF) 0.1 MG tablet   Oral   Take 0.2 mg by mouth daily.         . fluticasone (FLONASE) 50 MCG/ACT nasal spray   Nasal   Place 1 spray into the nose daily.          Marland Kitchen lidocaine (LIDODERM) 5 %   Transdermal   Place 1 patch onto the skin daily. Remove & Discard patch within 12 hours or as directed by MD         . loperamide (IMODIUM) 2 MG capsule   Oral   Take 2 mg by mouth 4 (four) times daily as needed for diarrhea or loose stools.         Marland Kitchen loratadine (CLARITIN) 10 MG tablet   Oral   Take 10 mg by mouth daily as needed for allergies.          . midodrine (PROAMATINE) 10 MG tablet   Oral   Take 1 tablet (10 mg total) by mouth 3 (three) times daily with meals.   90 tablet   0   . mirtazapine (REMERON) 15 MG tablet   Oral   Take 15 mg by mouth at bedtime.           Marland Kitchen morphine (MS CONTIN) 15 MG 12 hr tablet   Oral   Take 15 mg by mouth every 8 (eight) hours.         Marland Kitchen omeprazole (PRILOSEC) 40 MG capsule   Oral   Take 40 mg by mouth daily.         . ondansetron (ZOFRAN ODT) 4 MG disintegrating tablet   Oral   Take 1 tablet (4 mg total) by mouth every 8 (eight) hours as needed for nausea or vomiting.   20 tablet   0   . oxyCODONE (  ROXICODONE) 15 MG immediate release  tablet   Oral   Take 15 mg by mouth every 6 (six) hours as needed for pain.         . potassium chloride (K-DUR) 10 MEQ tablet   Oral   Take 20 mEq by mouth 2 (two) times daily.         . pregabalin (LYRICA) 150 MG capsule   Oral   Take 150 mg by mouth 3 (three) times daily.         . sertraline (ZOLOFT) 50 MG tablet   Oral   Take 50 mg by mouth daily.         Marland Kitchen spironolactone (ALDACTONE) 25 MG tablet   Oral   Take 25 mg by mouth daily.         . tamsulosin (FLOMAX) 0.4 MG CAPS capsule   Oral   Take 1 capsule (0.4 mg total) by mouth daily. Patient not taking: Reported on 11/06/2015   30 capsule   1   . tiZANidine (ZANAFLEX) 2 MG tablet   Oral   Take 2 mg by mouth 3 (three) times daily.         . vitamin B-12 (CYANOCOBALAMIN) 1000 MCG tablet   Oral   Take 1,000 mcg by mouth daily.          . vitamin C (ASCORBIC ACID) 500 MG tablet   Oral   Take 500 mg by mouth daily.           Allergies Shrimp  Family History  Problem Relation Age of Onset  . CAD Father     MI  . Diabetes Mellitus II Father   . Hypertension Father   . Diabetes Mellitus II Sister     Social History Social History  Substance Use Topics  . Smoking status: Former Smoker -- 0.25 packs/day for 1 years    Types: Cigarettes    Quit date: 11/10/1985  . Smokeless tobacco: None  . Alcohol Use: No    Review of Systems Constitutional: No fever/chills.No lightheadedness or syncope. Eyes: No visual changes. ENT: No sore throat. No congestion or rhinorrhea. Cardiovascular: Positive chest pain. Denies palpitations. Respiratory: Denies shortness of breath.  No cough. Gastrointestinal: Positive abdominal pain.  Positive nausea, no vomiting.  Positive diarrhea.  No constipation. Genitourinary: Negative for dysuria. Musculoskeletal: Negative for back pain. Negative for leg swelling. Skin: Negative for rash. Neurological: Negative for headaches. No focal numbness, tingling or  weakness.   10-point ROS otherwise negative.  ____________________________________________   PHYSICAL EXAM:  VITAL SIGNS: ED Triage Vitals  Enc Vitals Group     BP 02/03/16 1516 167/110 mmHg     Pulse Rate 02/03/16 1516 81     Resp 02/03/16 1516 18     Temp 02/03/16 1516 98.1 F (36.7 C)     Temp Source 02/03/16 1516 Oral     SpO2 02/03/16 1516 100 %     Weight 02/03/16 1516 120 lb (54.432 kg)     Height 02/03/16 1516 5\' 6"  (1.676 m)     Head Cir --      Peak Flow --      Pain Score 02/03/16 1512 8     Pain Loc --      Pain Edu? --      Excl. in [place]? --     Constitutional: Patient is alert and oriented and able to answer questions appropriately. She is chronically ill-appearing, as well as uncomfortable but nontoxic.  Eyes:  Conjunctivae are normal.  EOMI. No scleral icterus. Head: Atraumatic. Nose: No congestion/rhinnorhea. Mouth/Throat: Mucous membranes are dry.  Neck: No stridor.  Supple.  Pos JVD. Cardiovascular: Normal rate, regular rhythm. No murmurs, rubs or gallops.  Respiratory: Normal respiratory effort.  No accessory muscle use or retractions. Lungs CTAB.  No wheezes, rales or ronchi. Gastrointestinal: Abdomen is soft and nondistended. The patient has old well-healed surgical incisions over the epigastrium. She is tender to palpation in the epigastric area without rebound or guarding  No peritoneal signs. Musculoskeletal: No LE edema. No ttp in the calves or palpable cords.  Negative Homan's sign. Neurologic:  A&Ox3.  Speech is clear.  Face and smile are symmetric.  EOMI.  Moves all extremities well. Skin:  Skin is warm, dry and intact. No rash noted. Psychiatric: Mood and affect are normal. Speech and behavior are normal.  Normal judgement.  ____________________________________________   LABS (all labs ordered are listed, but only abnormal results are displayed)  Labs Reviewed  CBC - Abnormal; Notable for the following:    RBC 3.54 (*)    Hemoglobin 10.4  (*)    HCT 31.0 (*)    RDW 15.6 (*)    All other components within normal limits  BASIC METABOLIC PANEL - Abnormal; Notable for the following:    Chloride 114 (*)    Glucose, Bld 100 (*)    Anion gap 4 (*)    All other components within normal limits  LIPASE, BLOOD - Abnormal; Notable for the following:    Lipase 10 (*)    All other components within normal limits  RAPID INFLUENZA A&B ANTIGENS ([facility] ONLY)  TROPONIN I  TROPONIN I  URINALYSIS COMPLETEWITH MICROSCOPIC ([facility] ONLY)   ____________________________________________  EKG  ED ECG REPORT I, Eula Listen, the attending physician, personally viewed and interpreted this ECG.   Date: 02/03/2016  EKG Time: 1525  Rate: 80  Rhythm: normal sinus rhythm  Axis: Normal  Intervals:none  ST&T Change: Nonspecific T-wave inversions in V1. Poor baseline tracing but no ST elevation.  ____________________________________________  RADIOLOGY  Ct Abdomen Pelvis Wo Contrast  02/03/2016  CLINICAL DATA:  Epigastric abdominal pain, history of gastric bypass surgery EXAM: CT ABDOMEN AND PELVIS WITHOUT CONTRAST TECHNIQUE: Multidetector CT imaging of the abdomen and pelvis was performed following the standard protocol without IV contrast. COMPARISON:  05/23/2015 FINDINGS: Lower chest:  Lung bases are unremarkable. Hepatobiliary: Study is limited without IV contrast. Unenhanced liver shows no biliary ductal dilatation. Tiny gallstones are noted in gallbladder fundal region. Pancreas: Unenhanced pancreas is unremarkable. Spleen: Unenhanced spleen is unremarkable. Adrenals/Urinary Tract: No adrenal gland mass is noted. Unenhanced kidneys are symmetrical in size. No nephrolithiasis. No hydronephrosis or hydroureter. The urinary bladder is unremarkable. Stomach/Bowel: Again noted status post gastric bypass surgery. The anastomosis is patent. Again noted some fluid and small amount of gas within extended stomach without significant distension.  There is no small bowel obstruction. No mesenteric fluid collection. No pericecal inflammation. Contrast is noted within cecum. Normal appendix is noted in axial image 53. Scattered diverticula are noted descending colon. Scattered diverticula are noted proximal sigmoid colon. No evidence of acute diverticulitis. Some colonic gas noted in distal sigmoid colon. No distal colonic obstruction. Vascular/Lymphatic: Atherosclerotic calcifications of abdominal aorta. No aortic aneurysm. IVC filter in place. Reproductive: The patient is status post hysterectomy. Other: No ascites or free air. There is contrast material within distal esophagus suspicious for gastroesophageal reflux. Musculoskeletal: No destructive bony lesions are noted. Sagittal images of the  spine shows mild degenerative changes lower thoracic spine. IMPRESSION: 1. Suboptimal study without IV contrast. Stable postsurgical changes post gastric bypass the surgery. Anastomosis is patent. No evidence of small bowel or colonic obstruction. 2. Small contrast noted in distal esophagus suspicious for gastroesophageal reflux. 3. Normal appendix.  No pericecal inflammation. 4. No nephrolithiasis.  No hydronephrosis or hydroureter. 5. Status post hysterectomy. 6. Scattered colonic diverticula descending colon and proximal sigmoid colon without evidence of acute diverticulitis. No distal colonic obstruction. 7. IVC filter in place. Electronically Signed   By: Lahoma Crocker M.D.   On: 02/03/2016 20:03   Dg Chest 2 View  02/03/2016  CLINICAL DATA:  Chest pain. Nausea and vomiting for several days. History of cancer. EXAM: CHEST  2 VIEW COMPARISON:  08/23/2015. FINDINGS: The heart size and mediastinal contours are stable. The lungs are clear. There is no pleural effusion or pneumothorax. Prominent nipple shadow noted on the right. There are postsurgical changes within the right axilla and epigastric area. IVC filter noted. The bones appear intact. IMPRESSION: Stable  chest.  No active cardiopulmonary process. Electronically Signed   By: Richardean Sale M.D.   On: 02/03/2016 19:25    ____________________________________________   PROCEDURES  Procedure(s) performed: None  Critical Care performed: No ____________________________________________   INITIAL IMPRESSION / ASSESSMENT AND PLAN / ED COURSE  Pertinent labs & imaging results that were available during my care of the patient were reviewed by me and considered in my medical decision making (see chart for details).  60 y.o. female with a history of CHF and nonischemic cardiomyopathy, multiple previous abdominal surgeries, presenting with chest and abdominal pain as well as nausea with diarrhea. Overall the patient is hypertensive but otherwise has stable vital vital signs and is afebrile. Her constellation of symptoms are diffuse and we will check her for multiple etiologies of her pain. Her chest x-ray does not have any ischemic changes, but we will check a troponin. I will get an abdominal CT, but she does have a contrast dye allergy, so we will do a oral contrast study only. With chest and abdominal pain, would consider aortic dissection, but this is much less likely so I plan to proceed with this study I am unable to obtain without prepping her with steroids and Benadryl. We will follow her labs, as well as her clinical picture.  ----------------------------------------- 7:59 PM on 02/03/2016 -----------------------------------------  The patient states that her symptoms are significantly better. She appears more comfortable on my exam. She has been hypertensive throughout the entirety of her ED stay, and states that she skipped both her 1 PM and 6 PM medications. She does not remember what she takes but her medication list here states that she takes Klonopin. We will give her that medication and reevaluate her. I'm awaiting the results of her CT scan. Otherwise, she has a chronic anemia which is  unchanged, her electrolytes are reassuring and her troponin is negative. Her flu test today is negative as well. Her chest x-ray does not show any acute cardiopulmonary process.  ----------------------------------------- 8:33 PM on 02/03/2016 -----------------------------------------  The patient is resting comfortably but stating that she needs her home pain meds.  We will recheck a second troponin and if it is negative, plan d/c home.  ____________________________________________  FINAL CLINICAL IMPRESSION(S) / ED DIAGNOSES  Final diagnoses:  Chest pain, unspecified chest pain type  Abdominal pain, unspecified abdominal location  Nausea vomiting and diarrhea      NEW MEDICATIONS STARTED DURING THIS VISIT:  Discharge Medication List as of 02/03/2016  9:22 PM    START taking these medications   Details  ondansetron (ZOFRAN ODT) 4 MG disintegrating tablet Take 1 tablet (4 mg total) by mouth every 8 (eight) hours as needed for nausea or vomiting., Starting 02/03/2016, Until Discontinued, Print         Eula Listen, MD 02/03/16 2302

## 2016-02-03 NOTE — ED Notes (Addendum)
Pt hypertensive (see chart), MD made aware.

## 2016-02-03 NOTE — ED Notes (Signed)
Called pharmacy for [employer]

## 2016-02-03 NOTE — ED Notes (Signed)
Pt returned from CT °

## 2016-02-03 NOTE — ED Notes (Addendum)
Pt to X Ray.

## 2016-02-03 NOTE — ED Notes (Signed)
Called lab to collect blood.

## 2016-02-03 NOTE — ED Notes (Signed)
Pt to CT

## 2016-02-05 ENCOUNTER — Ambulatory Visit: Payer: Medicare Other | Admitting: Cardiovascular Disease

## 2016-05-27 ENCOUNTER — Telehealth: Payer: Self-pay | Admitting: Cardiovascular Disease

## 2016-05-27 NOTE — Telephone Encounter (Signed)
Lmov for patient to call back and schedule an appointment with [place] for refills.

## 2016-05-29 ENCOUNTER — Inpatient Hospital Stay
Admission: EM | Admit: 2016-05-29 | Discharge: 2016-05-30 | DRG: 309 | Disposition: A | Discharge status: Home / Self Care | Payer: Medicare Other | Attending: Internal Medicine | Admitting: Internal Medicine

## 2016-05-29 ENCOUNTER — Encounter: Payer: Self-pay | Admitting: Emergency Medicine

## 2016-05-29 DIAGNOSIS — I951 Orthostatic hypotension: Secondary | ICD-10-CM | POA: Diagnosis present

## 2016-05-29 DIAGNOSIS — Z8249 Family history of ischemic heart disease and other diseases of the circulatory system: Secondary | ICD-10-CM | POA: Diagnosis not present

## 2016-05-29 DIAGNOSIS — R7989 Other specified abnormal findings of blood chemistry: Secondary | ICD-10-CM

## 2016-05-29 DIAGNOSIS — R079 Chest pain, unspecified: Secondary | ICD-10-CM | POA: Diagnosis present

## 2016-05-29 DIAGNOSIS — I480 Paroxysmal atrial fibrillation: Secondary | ICD-10-CM | POA: Diagnosis present

## 2016-05-29 DIAGNOSIS — Z79899 Other long term (current) drug therapy: Secondary | ICD-10-CM

## 2016-05-29 DIAGNOSIS — Z87442 Personal history of urinary calculi: Secondary | ICD-10-CM

## 2016-05-29 DIAGNOSIS — Z87891 Personal history of nicotine dependence: Secondary | ICD-10-CM

## 2016-05-29 DIAGNOSIS — D638 Anemia in other chronic diseases classified elsewhere: Secondary | ICD-10-CM | POA: Diagnosis present

## 2016-05-29 DIAGNOSIS — Z7982 Long term (current) use of aspirin: Secondary | ICD-10-CM

## 2016-05-29 DIAGNOSIS — Z833 Family history of diabetes mellitus: Secondary | ICD-10-CM

## 2016-05-29 DIAGNOSIS — R748 Abnormal levels of other serum enzymes: Secondary | ICD-10-CM | POA: Diagnosis present

## 2016-05-29 DIAGNOSIS — G894 Chronic pain syndrome: Secondary | ICD-10-CM | POA: Diagnosis present

## 2016-05-29 DIAGNOSIS — R778 Other specified abnormalities of plasma proteins: Secondary | ICD-10-CM

## 2016-05-29 DIAGNOSIS — Z90710 Acquired absence of both cervix and uterus: Secondary | ICD-10-CM

## 2016-05-29 DIAGNOSIS — I5022 Chronic systolic (congestive) heart failure: Secondary | ICD-10-CM | POA: Diagnosis present

## 2016-05-29 DIAGNOSIS — D649 Anemia, unspecified: Secondary | ICD-10-CM | POA: Diagnosis present

## 2016-05-29 DIAGNOSIS — I4892 Unspecified atrial flutter: Secondary | ICD-10-CM

## 2016-05-29 DIAGNOSIS — Z7951 Long term (current) use of inhaled steroids: Secondary | ICD-10-CM

## 2016-05-29 DIAGNOSIS — C50919 Malignant neoplasm of unspecified site of unspecified female breast: Secondary | ICD-10-CM | POA: Diagnosis present

## 2016-05-29 DIAGNOSIS — Z86718 Personal history of other venous thrombosis and embolism: Secondary | ICD-10-CM | POA: Diagnosis not present

## 2016-05-29 DIAGNOSIS — I429 Cardiomyopathy, unspecified: Secondary | ICD-10-CM | POA: Diagnosis present

## 2016-05-29 DIAGNOSIS — Z9884 Bariatric surgery status: Secondary | ICD-10-CM

## 2016-05-29 DIAGNOSIS — E46 Unspecified protein-calorie malnutrition: Secondary | ICD-10-CM | POA: Diagnosis present

## 2016-05-29 DIAGNOSIS — M797 Fibromyalgia: Secondary | ICD-10-CM | POA: Diagnosis present

## 2016-05-29 LAB — CBC WITH DIFFERENTIAL/PLATELET
BASOS ABS: 0 10*3/uL (ref 0–0.1)
Basophils Relative: 0 %
EOS PCT: 0 %
Eosinophils Absolute: 0 10*3/uL (ref 0–0.7)
HEMATOCRIT: 33.3 % — AB (ref 35.0–47.0)
Hemoglobin: 11.4 g/dL — ABNORMAL LOW (ref 12.0–16.0)
LYMPHS PCT: 9 %
Lymphs Abs: 0.7 10*3/uL — ABNORMAL LOW (ref 1.0–3.6)
MCH: 29.7 pg (ref 26.0–34.0)
MCHC: 34.4 g/dL (ref 32.0–36.0)
MCV: 86.4 fL (ref 80.0–100.0)
Monocytes Absolute: 0.3 10*3/uL (ref 0.2–0.9)
Monocytes Relative: 4 %
NEUTROS ABS: 6.9 10*3/uL — AB (ref 1.4–6.5)
Neutrophils Relative %: 87 %
PLATELETS: 224 10*3/uL (ref 150–440)
RBC: 3.85 MIL/uL (ref 3.80–5.20)
RDW: 15.2 % — ABNORMAL HIGH (ref 11.5–14.5)
WBC: 8.1 10*3/uL (ref 3.6–11.0)

## 2016-05-29 LAB — COMPREHENSIVE METABOLIC PANEL
ALBUMIN: 3.9 g/dL (ref 3.5–5.0)
AST: 28 U/L (ref 15–41)
Alkaline Phosphatase: 79 U/L (ref 38–126)
Anion gap: 10 (ref 5–15)
BUN: 14 mg/dL (ref 6–20)
CHLORIDE: 103 mmol/L (ref 101–111)
CO2: 25 mmol/L (ref 22–32)
Calcium: 9.9 mg/dL (ref 8.9–10.3)
Creatinine, Ser: 1 mg/dL (ref 0.44–1.00)
GFR calc Af Amer: >60 mL/min (ref >60)
GFR calc non Af Amer: >60 mL/min (ref >60)
GLUCOSE: 114 mg/dL — AB (ref 65–99)
POTASSIUM: 4.9 mmol/L (ref 3.5–5.1)
Sodium: 138 mmol/L (ref 135–145)
Total Bilirubin: 0.9 mg/dL (ref 0.3–1.2)
Total Protein: 7.6 g/dL (ref 6.5–8.1)

## 2016-05-29 LAB — TROPONIN I
TROPONIN I: 0.04 ng/mL — AB (ref <0.03)
Troponin I: 0.05 ng/mL (ref <0.03)
Troponin I: 0.05 ng/mL (ref <0.03)

## 2016-05-29 LAB — LIPASE, BLOOD: LIPASE: 17 U/L (ref 11–51)

## 2016-05-29 MED ORDER — FERROUS SULFATE 325 (65 FE) MG PO TABS
325.0000 mg | ORAL_TABLET | Freq: Every day | ORAL | Status: DC
  Administered 2016-05-30: 325 mg via ORAL
  Filled 2016-05-29: qty 1

## 2016-05-29 MED ORDER — HYDROMORPHONE HCL 1 MG/ML IJ SOLN
1.0000 mg | INTRAMUSCULAR | Status: DC | PRN
  Administered 2016-05-29 – 2016-05-30 (×7): 1 mg via INTRAVENOUS
  Filled 2016-05-29 (×9): qty 1

## 2016-05-29 MED ORDER — NITROGLYCERIN 0.4 MG SL SUBL: 0.4000 mg | SUBLINGUAL_TABLET | SUBLINGUAL | Status: DC | PRN

## 2016-05-29 MED ORDER — FLUDROCORTISONE ACETATE 0.1 MG PO TABS
0.2000 mg | ORAL_TABLET | Freq: Every day | ORAL | Status: DC
  Administered 2016-05-29 – 2016-05-30 (×2): 0.2 mg via ORAL
  Filled 2016-05-29 (×3): qty 2

## 2016-05-29 MED ORDER — MIDODRINE HCL 5 MG PO TABS
10.0000 mg | ORAL_TABLET | ORAL | Status: DC
  Administered 2016-05-30 (×2): 10 mg via ORAL
  Filled 2016-05-29 (×2): qty 2

## 2016-05-29 MED ORDER — MORPHINE SULFATE ER 15 MG PO TBCR
15.0000 mg | EXTENDED_RELEASE_TABLET | Freq: Three times a day (TID) | ORAL | Status: DC
  Administered 2016-05-29 – 2016-05-30 (×3): 15 mg via ORAL
  Filled 2016-05-29 (×3): qty 1

## 2016-05-29 MED ORDER — POTASSIUM CHLORIDE ER 10 MEQ PO TBCR
20.0000 meq | EXTENDED_RELEASE_TABLET | Freq: Two times a day (BID) | ORAL | Status: DC
  Administered 2016-05-29 – 2016-05-30 (×2): 20 meq via ORAL
  Filled 2016-05-29 (×5): qty 2

## 2016-05-29 MED ORDER — VITAMIN D3 25 MCG (1000 UNIT) PO TABS
1000.0000 [IU] | ORAL_TABLET | Freq: Every day | ORAL | Status: DC
  Administered 2016-05-29 – 2016-05-30 (×2): 1000 [IU] via ORAL
  Filled 2016-05-29 (×4): qty 1

## 2016-05-29 MED ORDER — FLECAINIDE ACETATE 50 MG PO TABS
50.0000 mg | ORAL_TABLET | Freq: Two times a day (BID) | ORAL | Status: DC
  Administered 2016-05-29 – 2016-05-30 (×2): 50 mg via ORAL
  Filled 2016-05-29 (×4): qty 1

## 2016-05-29 MED ORDER — DOCUSATE SODIUM 100 MG PO CAPS
100.0000 mg | ORAL_CAPSULE | Freq: Two times a day (BID) | ORAL | Status: DC
  Administered 2016-05-29 – 2016-05-30 (×2): 100 mg via ORAL
  Filled 2016-05-29 (×2): qty 1

## 2016-05-29 MED ORDER — SERTRALINE HCL 50 MG PO TABS
50.0000 mg | ORAL_TABLET | Freq: Every day | ORAL | Status: DC
  Administered 2016-05-29 – 2016-05-30 (×2): 50 mg via ORAL
  Filled 2016-05-29 (×2): qty 1

## 2016-05-29 MED ORDER — HYDROMORPHONE HCL 1 MG/ML IJ SOLN
1.0000 mg | Freq: Once | INTRAMUSCULAR | Status: AC
  Administered 2016-05-29: 1 mg via INTRAVENOUS
  Filled 2016-05-29: qty 1

## 2016-05-29 MED ORDER — ANASTROZOLE 1 MG PO TABS
1.0000 mg | ORAL_TABLET | Freq: Every day | ORAL | Status: DC
  Administered 2016-05-29 – 2016-05-30 (×2): 1 mg via ORAL
  Filled 2016-05-29 (×3): qty 1

## 2016-05-29 MED ORDER — ENOXAPARIN SODIUM 40 MG/0.4ML ~~LOC~~ SOLN
40.0000 mg | SUBCUTANEOUS | Status: DC
  Administered 2016-05-29: 40 mg via SUBCUTANEOUS
  Filled 2016-05-29: qty 0.4

## 2016-05-29 MED ORDER — LOPERAMIDE HCL 2 MG PO CAPS: 2.0000 mg | ORAL_CAPSULE | Freq: Four times a day (QID) | ORAL | Status: DC | PRN

## 2016-05-29 MED ORDER — MIRTAZAPINE 15 MG PO TABS
15.0000 mg | ORAL_TABLET | Freq: Every day | ORAL | Status: DC
  Administered 2016-05-29: 15 mg via ORAL
  Filled 2016-05-29: qty 1

## 2016-05-29 MED ORDER — MIDODRINE HCL 5 MG PO TABS
5.0000 mg | ORAL_TABLET | Freq: Every day | ORAL | Status: DC
  Administered 2016-05-29: 5 mg via ORAL
  Filled 2016-05-29: qty 1

## 2016-05-29 MED ORDER — TIZANIDINE HCL 2 MG PO TABS
2.0000 mg | ORAL_TABLET | Freq: Three times a day (TID) | ORAL | Status: DC | PRN
  Filled 2016-05-29: qty 1

## 2016-05-29 MED ORDER — ONDANSETRON HCL 4 MG PO TABS: 4.0000 mg | ORAL_TABLET | Freq: Four times a day (QID) | ORAL | Status: DC | PRN

## 2016-05-29 MED ORDER — ASPIRIN EC 81 MG PO TBEC
81.0000 mg | DELAYED_RELEASE_TABLET | Freq: Every day | ORAL | Status: DC
  Administered 2016-05-29 – 2016-05-30 (×2): 81 mg via ORAL
  Filled 2016-05-29 (×2): qty 1

## 2016-05-29 MED ORDER — SPIRONOLACTONE 25 MG PO TABS
25.0000 mg | ORAL_TABLET | Freq: Every day | ORAL | Status: DC
  Administered 2016-05-30: 25 mg via ORAL
  Filled 2016-05-29: qty 1

## 2016-05-29 MED ORDER — VITAMIN B-12 1000 MCG PO TABS
1000.0000 ug | ORAL_TABLET | Freq: Every day | ORAL | Status: DC
  Administered 2016-05-29 – 2016-05-30 (×2): 1000 ug via ORAL
  Filled 2016-05-29 (×2): qty 1

## 2016-05-29 MED ORDER — ACETAMINOPHEN 650 MG RE SUPP: 650.0000 mg | Freq: Four times a day (QID) | RECTAL | Status: DC | PRN

## 2016-05-29 MED ORDER — VITAMIN C 500 MG PO TABS
500.0000 mg | ORAL_TABLET | Freq: Every day | ORAL | Status: DC
  Administered 2016-05-29 – 2016-05-30 (×2): 500 mg via ORAL
  Filled 2016-05-29 (×2): qty 1

## 2016-05-29 MED ORDER — BISACODYL 10 MG RE SUPP: 10.0000 mg | Freq: Every day | RECTAL | Status: DC | PRN

## 2016-05-29 MED ORDER — LIDOCAINE 5 % EX PTCH
1.0000 | MEDICATED_PATCH | CUTANEOUS | Status: DC
  Administered 2016-05-29: 1 via TRANSDERMAL
  Filled 2016-05-29 (×2): qty 1

## 2016-05-29 MED ORDER — CLONAZEPAM 0.5 MG PO TABS
0.5000 mg | ORAL_TABLET | Freq: Every day | ORAL | Status: DC
  Administered 2016-05-29: 0.5 mg via ORAL
  Filled 2016-05-29: qty 1

## 2016-05-29 MED ORDER — SODIUM CHLORIDE 0.9 % IV SOLN
INTRAVENOUS | Status: DC
  Administered 2016-05-29: 15:00:00 via INTRAVENOUS

## 2016-05-29 MED ORDER — PANTOPRAZOLE SODIUM 40 MG PO TBEC
40.0000 mg | DELAYED_RELEASE_TABLET | Freq: Every day | ORAL | Status: DC
  Administered 2016-05-29 – 2016-05-30 (×2): 40 mg via ORAL
  Filled 2016-05-29 (×2): qty 1

## 2016-05-29 MED ORDER — ONDANSETRON HCL 4 MG/2ML IJ SOLN: 4.0000 mg | Freq: Four times a day (QID) | INTRAMUSCULAR | Status: DC | PRN

## 2016-05-29 MED ORDER — FLUTICASONE PROPIONATE 50 MCG/ACT NA SUSP
2.0000 | Freq: Every day | NASAL | Status: DC
  Administered 2016-05-29 – 2016-05-30 (×2): 2 via NASAL
  Filled 2016-05-29: qty 16

## 2016-05-29 MED ORDER — SODIUM CHLORIDE 0.9% FLUSH
3.0000 mL | Freq: Two times a day (BID) | INTRAVENOUS | Status: DC
  Administered 2016-05-30: 3 mL via INTRAVENOUS

## 2016-05-29 MED ORDER — MIDODRINE HCL 5 MG PO TABS: 5.0000 mg | ORAL_TABLET | Freq: Every day | ORAL | Status: DC

## 2016-05-29 MED ORDER — LORATADINE 10 MG PO TABS: 10.0000 mg | ORAL_TABLET | Freq: Every day | ORAL | Status: DC | PRN

## 2016-05-29 MED ORDER — FAMOTIDINE IN NACL 20-0.9 MG/50ML-% IV SOLN
20.0000 mg | Freq: Two times a day (BID) | INTRAVENOUS | Status: DC
  Administered 2016-05-29 – 2016-05-30 (×2): 20 mg via INTRAVENOUS
  Filled 2016-05-29 (×4): qty 50

## 2016-05-29 MED ORDER — MIDODRINE HCL 5 MG PO TABS: 5.0000 mg | ORAL_TABLET | ORAL | Status: DC

## 2016-05-29 MED ORDER — SODIUM CHLORIDE 0.9 % IV BOLUS (SEPSIS)
500.0000 mL | Freq: Once | INTRAVENOUS | Status: AC
Start: 2016-05-29 — End: 2016-05-29
  Administered 2016-05-29: 500 mL via INTRAVENOUS

## 2016-05-29 MED ORDER — ACETAMINOPHEN 325 MG PO TABS: 650.0000 mg | ORAL_TABLET | Freq: Four times a day (QID) | ORAL | Status: DC | PRN

## 2016-05-29 MED ORDER — PREGABALIN 75 MG PO CAPS
150.0000 mg | ORAL_CAPSULE | Freq: Three times a day (TID) | ORAL | Status: DC
  Administered 2016-05-29 – 2016-05-30 (×3): 150 mg via ORAL
  Filled 2016-05-29 (×3): qty 2

## 2016-05-29 MED ORDER — MORPHINE SULFATE (PF) 4 MG/ML IV SOLN
4.0000 mg | Freq: Once | INTRAVENOUS | Status: AC
  Administered 2016-05-29: 4 mg via INTRAVENOUS
  Filled 2016-05-29: qty 1

## 2016-05-29 NOTE — H&P (Signed)
History and Physical    Lindsay [name] [id number] DOB: [birth year] DOA: 05/29/2016  Referring physician: Dr. Burlene Arnt PCP: Pcp Not In System  Specialists: none  Chief Complaint: Chest Pain  HPI: Lindsay [name] is a 60 y.o. female has a past medical history significant for A-fib, chronic anemia and fibromyalgia now with persistent CP despite IV meds given in ER. EKG shows A-flutter and troponin mildly elevated. She is now admitted. Still having CP but hx of chronic pain.  Review of Systems: The patient denies anorexia, fever, weight loss,, vision loss, decreased hearing, hoarseness,  syncope, dyspnea on exertion, peripheral edema, balance deficits, hemoptysis, abdominal pain, melena, hematochezia, severe indigestion/heartburn, hematuria, incontinence, genital sores, muscle weakness, suspicious skin lesions, transient blindness, difficulty walking, depression, unusual weight change, abnormal bleeding, enlarged lymph nodes, angioedema, and breast masses.   Past Medical History:  Diagnosis Date  . Anemia of chronic disease   . C. difficile diarrhea 10/2011  . Chronic cough   . Chronic systolic CHF (congestive heart failure) ([place])    a. echo 05/2015: EF 45-50%, nl wall motion, mod MR, nl PASP  . Depression   . Depression   . Fibromyalgia   . History of DVT (deep vein thrombosis)    a. s/p IVC filter 2014  . HX: breast cancer    a. right-side; b. s/p lumpectomy & chemo w/ Docetaxol, cytoxan, adriamycin  . Kidney stones 2013  . Malnutrition ([place])    a. chronic diarrhea s/p most meals   . Moderate mitral regurgitation   . Muscle spasm   . NICM (nonischemic cardiomyopathy) ([place])    a. Lexiscan 08/2015: no sig ischemia, no ECG changes concerning for ischemia (baseline abnormal T wave along anterolateral leads), EF 61%, low risk scan  . Orthostatic hypotension   . PAF (paroxysmal atrial fibrillation) ([place])    a. not on long term, full-dose anticoagulation since 2011, had high INR at that  time 2/2 possible malnutition, has been off anticoagulation since; b. CHADS2VASc at least 2 (CHF, female); c. 48 hr Holter 09/2015 NSR w/ rare PACs, no significant PVCs, periods of sinus tachycarida with average heart rate typically in the 60-100 range. No sig arrhythmia. No Afib seen.   . Peripheral neuropathy ([place])    a. secondary to chemo  . Postural hypotension   . S/P gastric bypass   . Vitamin D deficiency    Past Surgical History:  Procedure Laterality Date  . ABDOMINAL HYSTERECTOMY    . BREAST LUMPECTOMY     right @ [facility]  . ESOPHAGOGASTRODUODENOSCOPY (EGD) WITH PROPOFOL N/A 05/25/2015   Procedure: ESOPHAGOGASTRODUODENOSCOPY (EGD) WITH PROPOFOL;  Surgeon: Manya Silvas, MD;  Location: [facility] ENDOSCOPY;  Service: Endoscopy;  Laterality: N/A;  . LITHOTRIPSY  jan 2013   Social History:  reports that she quit smoking about 30 years ago. Her smoking use included Cigarettes. She has a 0.25 pack-year smoking history. She has never used smokeless tobacco. She reports that she does not drink alcohol or use drugs.  Allergies  Allergen Reactions  . Shrimp [Shellfish Allergy] Anaphylaxis    Family History  Problem Relation Age of Onset  . CAD Father     MI  . Diabetes Mellitus II Father   . Hypertension Father   . Diabetes Mellitus II Sister     Prior to Admission medications   Medication Sig Start Date End Date Taking? Authorizing Provider  anastrozole (ARIMIDEX) 1 MG tablet Take 1 mg by mouth daily.      Historical Provider,  MD  aspirin EC 81 MG tablet Take 81 mg by mouth daily.    Historical Provider, MD  cefUROXime (CEFTIN) 250 MG tablet Take 1 tablet (250 mg total) by mouth 2 (two) times daily with a meal. Patient not taking: Reported on 11/06/2015 08/26/15   Bettey Costa, MD  Cholecalciferol 10000 UNITS CAPS Take 1,000 Units by mouth daily.    Historical Provider, MD  clonazePAM (KLONOPIN) 0.5 MG tablet Take 0.5 mg by mouth at bedtime.     Historical Provider, MD  ferrous  sulfate 325 (65 FE) MG tablet Take 325 mg by mouth daily with breakfast. Reported on 11/06/2015    Historical Provider, MD  flecainide (TAMBOCOR) 100 MG tablet Take 0.5 tablets (50 mg total) by mouth every 12 (twelve) hours. 08/27/15   Fritzi Mandes, MD  fludrocortisone (FLORINEF) 0.1 MG tablet Take 0.2 mg by mouth daily.    Historical Provider, MD  fluticasone (FLONASE) 50 MCG/ACT nasal spray Place 1 spray into the nose daily.     Historical Provider, MD  lidocaine (LIDODERM) 5 % Place 1 patch onto the skin daily. Remove & Discard patch within 12 hours or as directed by MD    Historical Provider, MD  loperamide (IMODIUM) 2 MG capsule Take 2 mg by mouth 4 (four) times daily as needed for diarrhea or loose stools.    Historical Provider, MD  loratadine (CLARITIN) 10 MG tablet Take 10 mg by mouth daily as needed for allergies.     Historical Provider, MD  midodrine (PROAMATINE) 10 MG tablet Take 1 tablet (10 mg total) by mouth 3 (three) times daily with meals. 08/26/15   Bettey Costa, MD  mirtazapine (REMERON) 15 MG tablet Take 15 mg by mouth at bedtime.      Historical Provider, MD  morphine (MS CONTIN) 15 MG 12 hr tablet Take 15 mg by mouth every 8 (eight) hours.    Historical Provider, MD  omeprazole (PRILOSEC) 40 MG capsule Take 40 mg by mouth daily.    Historical Provider, MD  ondansetron (ZOFRAN ODT) 4 MG disintegrating tablet Take 1 tablet (4 mg total) by mouth every 8 (eight) hours as needed for nausea or vomiting. 02/03/16   Eula Listen, MD  oxyCODONE (ROXICODONE) 15 MG immediate release tablet Take 15 mg by mouth every 6 (six) hours as needed for pain.    Historical Provider, MD  potassium chloride (K-DUR) 10 MEQ tablet Take 20 mEq by mouth 2 (two) times daily.    Historical Provider, MD  pregabalin (LYRICA) 150 MG capsule Take 150 mg by mouth 3 (three) times daily.    Historical Provider, MD  sertraline (ZOLOFT) 50 MG tablet Take 50 mg by mouth daily.    Historical Provider, MD   spironolactone (ALDACTONE) 25 MG tablet Take 25 mg by mouth daily.    Historical Provider, MD  tamsulosin (FLOMAX) 0.4 MG CAPS capsule Take 1 capsule (0.4 mg total) by mouth daily. Patient not taking: Reported on 11/06/2015 05/25/15   Henreitta Leber, MD  tiZANidine (ZANAFLEX) 2 MG tablet Take 2 mg by mouth 3 (three) times daily.    Historical Provider, MD  vitamin B-12 (CYANOCOBALAMIN) 1000 MCG tablet Take 1,000 mcg by mouth daily.     Historical Provider, MD  vitamin C (ASCORBIC ACID) 500 MG tablet Take 500 mg by mouth daily.    Historical Provider, MD   Physical Exam: Vitals:   05/29/16 1200 05/29/16 1230 05/29/16 1300 05/29/16 1400  BP: (!) 150/100 (!) 126/92 116/87 113/88  Pulse:  71 78 80  Resp: 17 17 11 15   Temp:      TempSrc:      SpO2:  97% 99% 100%  Weight:      Height:         General:  No apparent distress, WDWN, [place]/AT  Eyes: PERRL, EOMI, no scleral icterus, conjunctiva clear  ENT: moist oropharynx without exudate, TM's benign, dentition fair  Neck: supple, no lymphadenopathy. No bruits or thyromegaly  Cardiovascular: regular rate without MRG; 2+ peripheral pulses, no JVD, no peripheral edema  Respiratory: CTA biL, good air movement without wheezing, rhonchi or crackled. Respiratory effort normal  Abdomen: soft, non tender to palpation, positive bowel sounds, no guarding, no rebound  Skin: no rashes or lesions  Musculoskeletal: normal bulk and tone, no joint swelling  Psychiatric: normal mood and affect, A&OX3  Neurologic: CN 2-12 grossly intact, Motor strength 5/5 in all 4 groups with symmetric DTR's and non-focal sensory exam  Labs on Admission:  Basic Metabolic Panel:  Recent Labs Lab 05/29/16 0951  NA 138  K 4.9  CL 103  CO2 25  GLUCOSE 114*  BUN 14  CREATININE 1.00  CALCIUM 9.9   Liver Function Tests:  Recent Labs Lab 05/29/16 0951  AST 28  ALT <5*  ALKPHOS 79  BILITOT 0.9  PROT 7.6  ALBUMIN 3.9    Recent Labs Lab  05/29/16 0951  LIPASE 17   No results for input(s): AMMONIA in the last 168 hours. CBC:  Recent Labs Lab 05/29/16 0951  WBC 8.1  NEUTROABS 6.9*  HGB 11.4*  HCT 33.3*  MCV 86.4  PLT 224   Cardiac Enzymes:  Recent Labs Lab 05/29/16 0951 05/29/16 1254  TROPONINI 0.04* 0.05*    BNP (last 3 results)  Recent Labs  11/06/15 1557  BNP 60.0    ProBNP (last 3 results) No results for input(s): PROBNP in the last 8760 hours.  CBG: No results for input(s): GLUCAP in the last 168 hours.  Radiological Exams on Admission: No results found.  EKG: Independently reviewed.  Assessment/Plan Principal Problem:   Atrial flutter (HCC) Active Problems:   Absolute anemia   Chest pain   Elevated troponin   Will admit to telemetry. Follow enzymes and order echo. Check TSH. Consult Cardiology. Continue prn SL NTG and IV pain meds.  Diet: clear liquids Fluids: NS@75  DVT Prophylaxis: Lovenox  Code Status: FULL  Family Communication: none  Disposition Plan: home  Time spent: 50 min

## 2016-05-29 NOTE — Discharge Instructions (Signed)
Return to the emergency room if you have any new or worrisome symptoms, follow closely with your doctor as needed.

## 2016-05-29 NOTE — Progress Notes (Signed)
Patient admitted from the ED.  IV in place #20 Left AC. Patient is c/o pain. States she hasn't had her scheduled pain medications.  States she takes schedule Morphine and oxycodone for chronic pain syndrome, chronic Fibromyalgia.States she is always cold 2nd to fibromyalgia.  Wears gloves at all times.  Room temp increased multiple blankets on the bed.

## 2016-05-29 NOTE — ED Triage Notes (Signed)
Pt presents to ED from home via EMS c/o generalized "pain all over". Stated to EMS chest pain.

## 2016-05-29 NOTE — ED Provider Notes (Addendum)
[facility] Emergency Department Provider Note  ____________________________________________   I have reviewed the triage vital signs and the nursing notes.   HISTORY  Chief Complaint Chest Pain    HPI Lindsay [name] is a 60 y.o. female with chronic pain problems fibromyalgia chronic abdominal pain chronic neuropathy chronic headaches and chronic recurrent chest pain, as well as multiple other medical problems presents today complaining of pain in her "entire body". Lindsay [name] is on strong pain medication for this at home but she states she did not take it this morning before calling 911. It is unclear why she elected not to take her pain medications for her chronic pain. Patient denies any change in her chronic pain. She has a headache, she has pain in her entire body including her legs arms back stomach chest and neck. There is no part of her body that does not hurt she states. This does not feel different from her chronic pain. Usually it is somewhat controlled by her pain medication. Patient's adamant that she did not run out of her pain medication even though her prescription is due to be refilled the day after tomorrow. She is requesting narcotic pain medication for this chronic pain issue. He states he also didn't take any of her other medications this morning either.    Past Medical History:  Diagnosis Date  . Anemia of chronic disease   . C. difficile diarrhea 10/2011  . Chronic cough   . Chronic systolic CHF (congestive heart failure) ([place])    a. echo 05/2015: EF 45-50%, nl wall motion, mod MR, nl PASP  . Depression   . Depression   . Fibromyalgia   . History of DVT (deep vein thrombosis)    a. s/p IVC filter 2014  . HX: breast cancer    a. right-side; b. s/p lumpectomy & chemo w/ Docetaxol, cytoxan, adriamycin  . Kidney stones 2013  . Malnutrition ([place])    a. chronic diarrhea s/p most meals   . Moderate mitral regurgitation   . Muscle spasm   .  NICM (nonischemic cardiomyopathy) ([place])    a. Lexiscan 08/2015: no sig ischemia, no ECG changes concerning for ischemia (baseline abnormal T wave along anterolateral leads), EF 61%, low risk scan  . Orthostatic hypotension   . PAF (paroxysmal atrial fibrillation) ([place])    a. not on long term, full-dose anticoagulation since 2011, had high INR at that time 2/2 possible malnutition, has been off anticoagulation since; b. CHADS2VASc at least 2 (CHF, female); c. 48 hr Holter 09/2015 NSR w/ rare PACs, no significant PVCs, periods of sinus tachycarida with average heart rate typically in the 60-100 range. No sig arrhythmia. No Afib seen.   . Peripheral neuropathy ([place])    a. secondary to chemo  . Postural hypotension   . S/P gastric bypass   . Vitamin D deficiency     Patient Active Problem List   Diagnosis Date Noted  . Postural hypotension   . Moderate mitral regurgitation   . PAF (paroxysmal atrial fibrillation) ([place])   . Malnutrition ([place])   . S/P gastric bypass   . Fibromyalgia   . Peripheral neuropathy ([place])   . NICM (nonischemic cardiomyopathy) ([place])   . Chronic systolic CHF (congestive heart failure) ([place])   . Recurrent major depressive disorder, in partial remission ([place])   . Major depression ([place]) 08/26/2015  . Weakness of both legs   . Absolute anemia   . Pain in the chest   . Orthostatic  hypotension   . Paroxysmal atrial fibrillation (HCC)   . Syncope and collapse   . Paroxysmal a-fib ([place])   . Atrial fibrillation ([place]) 05/24/2015  . Abdominal pain 05/23/2015  . C. difficile colitis 12/12/2011  . Chest pain 11/11/2011  . Syncope 11/11/2011  . Hypotension 11/11/2011  . Cardiomyopathy 11/11/2011  . AF (atrial fibrillation) ([place]) 11/11/2011    Past Surgical History:  Procedure Laterality Date  . ABDOMINAL HYSTERECTOMY    . BREAST LUMPECTOMY     right @ [facility]  . ESOPHAGOGASTRODUODENOSCOPY (EGD) WITH PROPOFOL N/A 05/25/2015   Procedure: ESOPHAGOGASTRODUODENOSCOPY (EGD)  WITH PROPOFOL;  Surgeon: Manya Silvas, MD;  Location: [facility] ENDOSCOPY;  Service: Endoscopy;  Laterality: N/A;  . LITHOTRIPSY  jan 2013    Current Outpatient Rx  . Order #: [id number] Class: Historical Med  . Order #: [id number] Class: Historical Med  . Order #: [id number] Class: Normal  . Order #: [id number] Class: Historical Med  . Order #: [id number] Class: Historical Med  . Order #: [id number] Class: Historical Med  . Order #: [id number] Class: Normal  . Order #: [id number] Class: Historical Med  . Order #: [id number] Class: Historical Med  . Order #: [id number] Class: Historical Med  . Order #: [id number] Class: Historical Med  . Order #: [id number] Class: Historical Med  . Order #: [id number] Class: Normal  . Order #: [id number] Class: Historical Med  . Order #: [id number] Class: Historical Med  . Order #: [id number] Class: Historical Med  . Order #: [id number] Class: Print  . Order #: [id number] Class: Historical Med  . Order #: [id number] Class: Historical Med  . Order #: [id number] Class: Historical Med  . Order #: [id number] Class: Historical Med  . Order #: [id number] Class: Historical Med  . Order #: [id number] Class: Print  . Order #: [id number] Class: Historical Med  . Order #: [id number] Class: Historical Med  . Order #: [id number] Class: Historical Med    Allergies Shrimp [shellfish allergy]  Family History  Problem Relation Age of Onset  . CAD Father     MI  . Diabetes Mellitus II Father   . Hypertension Father   . Diabetes Mellitus II Sister     Social History Social History  Substance Use Topics  . Smoking status: Former Smoker    Packs/day: 0.25    Years: 1.00    Types: Cigarettes    Quit date: 11/10/1985  . Smokeless tobacco: Never Used  . Alcohol use No    Review of Systems Constitutional: No fever/chills Eyes: No visual changes. ENT: No sore throat. No stiff neck no neck pain Cardiovascular: See history of present illness Respiratory: Denies shortness of breath. Gastrointestinal:   no  vomiting.  No diarrhea.  No constipation. Genitourinary: Negative for dysuria. Musculoskeletal: Negative lower extremity swelling Skin: Negative for rash. Neurological: Negative for severe atypical headaches, focal weakness or numbness. 10-point ROS otherwise negative.  ____________________________________________   PHYSICAL EXAM:  VITAL SIGNS: ED Triage Vitals [05/29/16 0850]  Enc Vitals Group     BP (!) 147/109     Pulse Rate 80     Resp 20     Temp 98.7 F (37.1 C)     Temp Source Oral     SpO2 100 %     Weight 120 lb (54.4 kg)     Height 5\' 6"  (1.676 m)     Head Circumference      Peak Flow      Pain Score      Pain Loc      Pain Edu?      Excl. in [place]?  Constitutional: Alert and oriented. Patient is class doing her body lying on her side rocking and shaking Eyes: Conjunctivae are normal. PERRL. EOMI. Head: Atraumatic. Nose: No congestion/rhinnorhea. Mouth/Throat: Mucous membranes are moist.  Oropharynx non-erythematous. Neck: No stridor.   Nontender with no meningismus Cardiovascular: Normal rate, regular rhythm. Grossly normal heart sounds.  Good peripheral circulation. Respiratory: Normal respiratory effort.  No retractions. Lungs CTAB. Abdominal: Soft and nontender. No distention. No guarding no rebound Back:  There is no focal tenderness or step off.  there is no midline tenderness there are no lesions noted. there is no CVA tenderness Musculoskeletal: No lower extremity tenderness, no upper extremity tenderness. No joint effusions, no DVT signs strong distal pulses no edema Neurologic:  Normal speech and language. No gross focal neurologic deficits are appreciated.  Skin:  Skin is warm, dry and intact. No rash noted. Psychiatric: Mood and affect are anxious and upset. Speech and behavior are normal.  ____________________________________________   LABS (all labs ordered are listed, but only abnormal results are displayed)  Labs Reviewed  CBC WITH  DIFFERENTIAL/PLATELET - Abnormal; Notable for the following:       Result Value   Hemoglobin 11.4 (*)    HCT 33.3 (*)    RDW 15.2 (*)    Neutro Abs 6.9 (*)    Lymphs Abs 0.7 (*)    All other components within normal limits  TROPONIN I  COMPREHENSIVE METABOLIC PANEL  LIPASE, BLOOD   ____________________________________________  EKG  I personally interpreted any EKGs ordered by me or triage Sinus rhythm rate 76 bpm no acute ST elevation or acute ST depression normal axis no acute ST elevation or depression unremarkable EKG ____________________________________________  RADIOLOGY  I reviewed any imaging ordered by me or triage that were performed during my shift and, if possible, patient and/or family made aware of any abnormal findings. ____________________________________________   PROCEDURES  Procedure(s) performed: None  Procedures  Critical Care performed: None  ____________________________________________   INITIAL IMPRESSION / ASSESSMENT AND PLAN / ED COURSE  Pertinent labs & imaging results that were available during my care of the patient were reviewed by me and considered in my medical decision making (see chart for details).  Patient here complaining of her chronic "everything" pain she is very close to the end of her prescription for narcotic pain medication although she states she still has it. At this time, I do not feel that this likely represents ACS PE dissection head bleed intra-abdominal pathology such as obstruction. Patient's blood pressures elevated as it often is when she is here. We will evaluate her for possible angina although low suspicion. This is been going on for several days if she has a negative troponin I do not pick serial enzymes are negative. Patient is noncompliant with her medications and I am not surprised her blood pressure is elevated. She actually lives like she might be in some degree of withdrawal from her narcotics.  Clinical Course    ----------------------------------------- 12:23 PM on 05/29/2016 -----------------------------------------  Patient is in no acute distress at this time, is initially quite upset that I "only" gave her 4 mg of morphine. She is upset that this is insufficient to manage her chronic pain. She is requesting stronger pain medication and nausea medication. Given that she has a borderline troponin, I cannot be discharged the patient, in a very low suspicion for ACS this is a very borderline result which is usually negative and this lab however given the  very sensitive nature of  this test we do have to check it twice to make sure that this isn't something significantly we will do. Patient has no ongoing chest pain here. She is complaining of her chronic neuropathy at this time. If second troponin is negative is my hope that we can get the patient safely home.  ----------------------------------------- 2:42 PM on 05/29/2016 -----------------------------------------  Patient's troponin are very borderline but not trending down the arm slightly elevated. This time and no palatial because of PE or dissection but given her history, I think that an observational stay given lab abnormalities would not be aneurysmal. Patient states her pain is better but not completely controlled. She still complained of full body pain " including the chest". ____________________________________________   FINAL CLINICAL IMPRESSION(S) / ED DIAGNOSES  Final diagnoses:  None      This chart was dictated using voice recognition software.  Despite best efforts to proofread,  errors can occur which can change meaning.      Schuyler Amor, MD 05/29/16 1007     Schuyler Amor, MD 05/29/16 [place], MD 05/29/16 [phone]

## 2016-05-30 ENCOUNTER — Inpatient Hospital Stay (HOSPITAL_COMMUNITY)
Admit: 2016-05-30 | Discharge: 2016-05-30 | Disposition: A | Discharge status: Home / Self Care | Payer: Medicare Other | Attending: Internal Medicine | Admitting: Internal Medicine

## 2016-05-30 DIAGNOSIS — R079 Chest pain, unspecified: Secondary | ICD-10-CM

## 2016-05-30 DIAGNOSIS — I4892 Unspecified atrial flutter: Secondary | ICD-10-CM

## 2016-05-30 DIAGNOSIS — R7989 Other specified abnormal findings of blood chemistry: Secondary | ICD-10-CM

## 2016-05-30 LAB — GLUCOSE, CAPILLARY: Glucose-Capillary: 103 mg/dL — ABNORMAL HIGH (ref 65–99)

## 2016-05-30 LAB — COMPREHENSIVE METABOLIC PANEL
ALT: 5 U/L — AB (ref 14–54)
AST: 19 U/L (ref 15–41)
Albumin: 3.3 g/dL — ABNORMAL LOW (ref 3.5–5.0)
Alkaline Phosphatase: 65 U/L (ref 38–126)
Anion gap: 4 — ABNORMAL LOW (ref 5–15)
BUN: 15 mg/dL (ref 6–20)
CHLORIDE: 107 mmol/L (ref 101–111)
CO2: 27 mmol/L (ref 22–32)
CREATININE: 1.02 mg/dL — AB (ref 0.44–1.00)
Calcium: 9.3 mg/dL (ref 8.9–10.3)
GFR calc Af Amer: >60 mL/min (ref >60)
GFR calc non Af Amer: 60 mL/min — ABNORMAL LOW (ref >60)
Glucose, Bld: 103 mg/dL — ABNORMAL HIGH (ref 65–99)
POTASSIUM: 3.4 mmol/L — AB (ref 3.5–5.1)
Sodium: 138 mmol/L (ref 135–145)
Total Bilirubin: 0.6 mg/dL (ref 0.3–1.2)
Total Protein: 6.3 g/dL — ABNORMAL LOW (ref 6.5–8.1)

## 2016-05-30 LAB — CBC
HEMATOCRIT: 30.6 % — AB (ref 35.0–47.0)
Hemoglobin: 10.4 g/dL — ABNORMAL LOW (ref 12.0–16.0)
MCH: 29.5 pg (ref 26.0–34.0)
MCHC: 34 g/dL (ref 32.0–36.0)
MCV: 86.8 fL (ref 80.0–100.0)
PLATELETS: 199 10*3/uL (ref 150–440)
RBC: 3.52 MIL/uL — AB (ref 3.80–5.20)
RDW: 15 % — ABNORMAL HIGH (ref 11.5–14.5)
WBC: 7.7 10*3/uL (ref 3.6–11.0)

## 2016-05-30 LAB — ECHOCARDIOGRAM COMPLETE
HEIGHTINCHES: 66 in
WEIGHTICAEL: 1920 [oz_av]

## 2016-05-30 LAB — TROPONIN I: Troponin I: 0.03 ng/mL (ref <0.03)

## 2016-05-30 LAB — THYROID PANEL WITH TSH
FREE THYROXINE INDEX: 1.2 (ref 1.2–4.9)
T3 UPTAKE RATIO: 27 % (ref 24–39)
T4 TOTAL: 4.3 ug/dL — AB (ref 4.5–12.0)
TSH: 0.746 u[IU]/mL (ref 0.450–4.500)

## 2016-05-30 MED ORDER — MORPHINE SULFATE ER 15 MG PO TBCR: 15.0000 mg | EXTENDED_RELEASE_TABLET | Freq: Three times a day (TID) | ORAL | 0 refills | Status: DC

## 2016-05-30 NOTE — Discharge Summary (Signed)
Section at [facility] NAME: Lindsay [name]    MR#:  [id number]  DATE OF BIRTH:  [birth year]  DATE OF ADMISSION:  05/29/2016 ADMITTING PHYSICIAN: Idelle Crouch, MD  DATE OF DISCHARGE:05/30/2016  PRIMARY CARE PHYSICIAN: Pcp Not In System    ADMISSION DIAGNOSIS:  Chest pain, unspecified chest pain type [R07.9]  DISCHARGE DIAGNOSIS:  Principal Problem:   Atrial flutter (HCC) Active Problems:   Absolute anemia   Chest pain   Elevated troponin   SECONDARY DIAGNOSIS:   Past Medical History:  Diagnosis Date  . Anemia of chronic disease   . C. difficile diarrhea 10/2011  . Chronic cough   . Chronic systolic CHF (congestive heart failure) ([place])    a. echo 05/2015: EF 45-50%, nl wall motion, mod MR, nl PASP  . Depression   . Depression   . Fibromyalgia   . History of DVT (deep vein thrombosis)    a. s/p IVC filter 2014  . HX: breast cancer    a. right-side; b. s/p lumpectomy & chemo w/ Docetaxol, cytoxan, adriamycin  . Kidney stones 2013  . Malnutrition ([place])    a. chronic diarrhea s/p most meals   . Moderate mitral regurgitation   . Muscle spasm   . NICM (nonischemic cardiomyopathy) ([place])    a. Lexiscan 08/2015: no sig ischemia, no ECG changes concerning for ischemia (baseline abnormal T wave along anterolateral leads), EF 61%, low risk scan  . Orthostatic hypotension   . PAF (paroxysmal atrial fibrillation) ([place])    a. not on long term, full-dose anticoagulation since 2011, had high INR at that time 2/2 possible malnutition, has been off anticoagulation since; b. CHADS2VASc at least 2 (CHF, female); c. 48 hr Holter 09/2015 NSR w/ rare PACs, no significant PVCs, periods of sinus tachycarida with average heart rate typically in the 60-100 range. No sig arrhythmia. No Afib seen.   . Peripheral neuropathy ([place])    a. secondary to chemo  . Postural hypotension   . S/P gastric bypass   . Vitamin D deficiency     HOSPITAL  COURSE:   Came with chest and arm pain- have fibromyalgia.  monitored on tele, EKG and Echocardiogram are done- negative . Cardiology saw the pt and agreed that she is low risk for cardiac pain for now.Advised to restart her Fibromyalgia meds. Will discharge her home. She was not taking anticoagulant for her A fib, and only on rate control.  DISCHARGE CONDITIONS:   Stable.  CONSULTS OBTAINED:  Treatment Team:  Leonie Man, MD Minna Merritts, MD  DRUG ALLERGIES:   Allergies  Allergen Reactions  . Shrimp [Shellfish Allergy] Anaphylaxis    DISCHARGE MEDICATIONS:   Current Discharge Medication List    CONTINUE these medications which have CHANGED   Details  morphine (MS CONTIN) 15 MG 12 hr tablet Take 1 tablet (15 mg total) by mouth every 8 (eight) hours. Qty: 4 tablet, Refills: 0      CONTINUE these medications which have NOT CHANGED   Details  anastrozole (ARIMIDEX) 1 MG tablet Take 1 mg by mouth daily.      aspirin EC 81 MG tablet Take 81 mg by mouth daily.    Cholecalciferol (VITAMIN D3) 1000 units CAPS Take 1,000 Units by mouth daily.    clonazePAM (KLONOPIN) 0.5 MG tablet Take 0.5 mg by mouth at bedtime.     ferrous sulfate 325 (65 FE) MG tablet Take 325 mg by mouth daily  with breakfast. Reported on 11/06/2015    flecainide (TAMBOCOR) 100 MG tablet Take 0.5 tablets (50 mg total) by mouth every 12 (twelve) hours. Qty: 60 tablet, Refills: 0    fludrocortisone (FLORINEF) 0.1 MG tablet Take 0.2 mg by mouth daily.    fluticasone (FLONASE) 50 MCG/ACT nasal spray Place 1 spray into the nose daily.     lidocaine (LIDODERM) 5 % Place 1 patch onto the skin daily. Remove & Discard patch within 12 hours or as directed by MD    loperamide (IMODIUM) 2 MG capsule Take 2 mg by mouth 4 (four) times daily as needed for diarrhea or loose stools.    loratadine (CLARITIN) 10 MG tablet Take 10 mg by mouth daily as needed for allergies.     midodrine (PROAMATINE) 5 MG tablet  Take 5-10 mg by mouth See admin instructions. Take 2 tablets (10 mg) by mouth every morning, take 1 tablet by mouth at lunch, and 1 tablet by mouth every night at bedtime.    mirtazapine (REMERON) 15 MG tablet Take 15 mg by mouth at bedtime.      omeprazole (PRILOSEC) 40 MG capsule Take 40 mg by mouth daily.    ondansetron (ZOFRAN ODT) 4 MG disintegrating tablet Take 1 tablet (4 mg total) by mouth every 8 (eight) hours as needed for nausea or vomiting. Qty: 20 tablet, Refills: 0    oxyCODONE (ROXICODONE) 15 MG immediate release tablet Take 15 mg by mouth every 6 (six) hours as needed for pain.    potassium chloride (K-DUR) 10 MEQ tablet Take 20 mEq by mouth 2 (two) times daily.    pregabalin (LYRICA) 150 MG capsule Take 150 mg by mouth 3 (three) times daily.    sertraline (ZOLOFT) 50 MG tablet Take 50 mg by mouth daily.    spironolactone (ALDACTONE) 25 MG tablet Take 25 mg by mouth daily.    tiZANidine (ZANAFLEX) 2 MG tablet Take 2 mg by mouth 3 (three) times daily as needed for muscle spasms.     vitamin B-12 (CYANOCOBALAMIN) 1000 MCG tablet Take 1,000 mcg by mouth daily.     vitamin C (ASCORBIC ACID) 500 MG tablet Take 500 mg by mouth daily.      STOP taking these medications     cefUROXime (CEFTIN) 250 MG tablet          DISCHARGE INSTRUCTIONS:    Follow with PMD in 1-2 weeks.  If you experience worsening of your admission symptoms, develop shortness of breath, life threatening emergency, suicidal or homicidal thoughts you must seek medical attention immediately by calling 911 or calling your MD immediately  if symptoms less severe.  You Must read complete instructions/literature along with all the possible adverse reactions/side effects for all the Medicines you take and that have been prescribed to you. Take any new Medicines after you have completely understood and accept all the possible adverse reactions/side effects.   Please note  You were cared for by a  hospitalist during your hospital stay. If you have any questions about your discharge medications or the care you received while you were in the hospital after you are discharged, you can call the unit and asked to speak with the hospitalist on call if the hospitalist that took care of you is not available. Once you are discharged, your primary care physician will handle any further medical issues. Please note that NO REFILLS for any discharge medications will be authorized once you are discharged, as it is imperative that you  return to your primary care physician (or establish a relationship with a primary care physician if you do not have one) for your aftercare needs so that they can reassess your need for medications and monitor your lab values.    Today   CHIEF COMPLAINT:   Chief Complaint  Patient presents with  . Chest Pain    HISTORY OF PRESENT ILLNESS:  Lindsay [name]  is a 60 y.o. female with a known history of A-fib, chronic anemia and fibromyalgia now with persistent CP despite IV meds given in ER. EKG shows A-flutter and troponin mildly elevated. She is now admitted. Still having CP but hx of chronic pain.  VITAL SIGNS:  Blood pressure 112/87, pulse 72, temperature 98.3 F (36.8 C), temperature source Oral, resp. rate 16, height 5\' 6"  (1.676 m), weight 54.4 kg (120 lb), SpO2 94 %.  I/O:   Intake/Output Summary (Last 24 hours) at 05/30/16 1214 Last data filed at 05/30/16 0600  Gross per 24 hour  Intake          1148.75 ml  Output              825 ml  Net           323.75 ml    PHYSICAL EXAMINATION:  GENERAL:  60 y.o.-year-old patient lying in the bed with no acute distress.  EYES: Pupils equal, round, reactive to light and accommodation. No scleral icterus. Extraocular muscles intact.  HEENT: Head atraumatic, normocephalic. Oropharynx and nasopharynx clear.  NECK:  Supple, no jugular venous distention. No thyroid enlargement, no tenderness.  LUNGS: Normal breath sounds  bilaterally, no wheezing, rales,rhonchi or crepitation. No use of accessory muscles of respiration.  CARDIOVASCULAR: S1, S2 normal. No murmurs, rubs, or gallops.  ABDOMEN: Soft, non-tender, non-distended. Bowel sounds present. No organomegaly or mass.  EXTREMITIES: No pedal edema, cyanosis, or clubbing.  NEUROLOGIC: Cranial nerves II through XII are intact. Muscle strength 5/5 in all extremities. Sensation intact. Gait not checked.  PSYCHIATRIC: The patient is alert and oriented x 3.  SKIN: No obvious rash, lesion, or ulcer.   DATA REVIEW:   CBC  Recent Labs Lab 05/30/16 0042  WBC 7.7  HGB 10.4*  HCT 30.6*  PLT 199    Chemistries   Recent Labs Lab 05/30/16 0042  NA 138  K 3.4*  CL 107  CO2 27  GLUCOSE 103*  BUN 15  CREATININE 1.02*  CALCIUM 9.3  AST 19  ALT 5*  ALKPHOS 65  BILITOT 0.6    Cardiac Enzymes  Recent Labs Lab 05/30/16 0653  TROPONINI 0.03*    Microbiology Results  Results for orders placed or performed during the hospital encounter of 02/03/16  Rapid Influenza A&B Antigens ([facility] only)     Status: None   Collection Time: 02/03/16  5:26 PM  Result Value Ref Range Status   Influenza A ([facility]) NEGATIVE NEGATIVE Final   Influenza B ([facility]) NEGATIVE NEGATIVE Final    RADIOLOGY:  No results found.  EKG:   Orders placed or performed during the hospital encounter of 05/29/16  . EKG 12-Lead  . EKG 12-Lead      Management plans discussed with the patient, family and they are in agreement.  CODE STATUS:     Code Status Orders        Start     Ordered   05/29/16 1624  Full code  Continuous     05/29/16 1624    Code Status History    Date  Active Date Inactive Code Status Order ID Comments User Context   05/29/2016  4:24 PM 05/30/2016  8:01 AM Full Code [id number]  Idelle Crouch, MD Inpatient   08/23/2015  4:14 PM 08/27/2015  7:54 PM Full Code [id number]  Fritzi Mandes, MD ED   05/23/2015 11:00 AM 05/26/2015  3:23 PM Full Code [id number]  Henreitta Leber, MD Inpatient      TOTAL TIME TAKING CARE OF THIS PATIENT: 35 minutes.    Vaughan Basta M.D on 05/30/2016 at 12:14 PM  Between 7am to 6pm - Pager - [phone]  After 6pm go to [email or web address] - password [email or web address] [facility]  Office  [phone]  CC: Primary care physician; Pcp Not In System   Note: This dictation was prepared with Dragon dictation along with smaller phrase technology. Any transcriptional errors that result from this process are unintentional.

## 2016-05-30 NOTE — Consult Note (Signed)
Cardiology Consult    Patient ID: Lindsay [name] MRN: [id number], DOB/AGE: Dec 31, 1955   Admit date: 05/29/2016 Date of Consult: 05/30/2016   Primary Physician: Pcp Not In System Reason for Consult: Chest Pain, Atrial Flutter Primary Cardiologist: Dr. Rockey Situ Requesting Provider: Dr. Doy Hutching   History of Present Illness    Lindsay [name] is a 60 y.o. female with past medical history of NICM (last ischemic evaluation was Lexiscan in [id number]), chronic systolic CHF (EF improved to 50-55% by echo in 12/2015), PAF (not on full dose anticoagulation since 2011), postural hypotension, orthostatic syncope, fibromyalgia, and chronic pain who presented to [facility] on 05/29/2016 for acute worsening of her chronic pain.  The patient reports having chronic pain and numbness for "many years" secondary to chemotherapy she underwent for breast cancer. She has pain on a daily basis but reports this was acutely worse on 7/22 when she went outside in the mid-day heat and traveled in her warm car to the grocery store. Ever since, she says she has felt a numbness/tightness across her chest, back, and upper extremities. This has been constant since onset. There is no exertional component and this is made worse when she is lying down. No associated nausea, vomiting, or diaphoresis. No prior chest discomfort or dyspnea with exertion for the past several weeks. She takes MS Contin and Oxycodone for her chronic pain and did run out of the MS Contin two days ago as well.  She denies any lower extremity edema, orthopnea, or PND. No palpitations. Unaware if she has experienced any episodes of PAF but does report she usually feels her heart race when she is out of NSR. Reports having never started on DAPT (intolerant to Warfarin in the past due to uncontrollable INR's) and is not interested in this currently.  While admitted, labs have shown a WBC of 8.1, Hgb 11.4, platelets 224. Cyclic troponin values have been 0.04, 0.05, 0.05,  and <0.03. CMET with K+ 4.9 and creatinine 1.00. TSH pending. EKG read as atrial flutter but distinct p-waves are noted. In addition to this, telemetry has shown a NSR since admission.    Past Medical History   Past Medical History:  Diagnosis Date  . Anemia of chronic disease   . C. difficile diarrhea 10/2011  . Chronic cough   . Chronic systolic CHF (congestive heart failure) ([place])    a. echo 05/2015: EF 45-50%, nl wall motion, mod MR, nl PASP  . Depression   . Depression   . Fibromyalgia   . History of DVT (deep vein thrombosis)    a. s/p IVC filter 2014  . HX: breast cancer    a. right-side; b. s/p lumpectomy & chemo w/ Docetaxol, cytoxan, adriamycin  . Kidney stones 2013  . Malnutrition ([place])    a. chronic diarrhea s/p most meals   . Moderate mitral regurgitation   . Muscle spasm   . NICM (nonischemic cardiomyopathy) ([place])    a. Lexiscan 08/2015: no sig ischemia, no ECG changes concerning for ischemia (baseline abnormal T wave along anterolateral leads), EF 61%, low risk scan  . Orthostatic hypotension   . PAF (paroxysmal atrial fibrillation) ([place])    a. not on long term, full-dose anticoagulation since 2011, had high INR at that time 2/2 possible malnutition, has been off anticoagulation since; b. CHADS2VASc at least 2 (CHF, female); c. 48 hr Holter 09/2015 NSR w/ rare PACs, no significant PVCs, periods of sinus tachycarida with average heart rate typically in the 60-100 range. No sig  arrhythmia. No Afib seen.   . Peripheral neuropathy ([place])    a. secondary to chemo  . Postural hypotension   . S/P gastric bypass   . Vitamin D deficiency     Past Surgical History:  Procedure Laterality Date  . ABDOMINAL HYSTERECTOMY    . BREAST LUMPECTOMY     right @ [facility]  . ESOPHAGOGASTRODUODENOSCOPY (EGD) WITH PROPOFOL N/A 05/25/2015   Procedure: ESOPHAGOGASTRODUODENOSCOPY (EGD) WITH PROPOFOL;  Surgeon: Manya Silvas, MD;  Location: [facility] ENDOSCOPY;  Service: Endoscopy;  Laterality:  N/A;  . LITHOTRIPSY  jan 2013     Allergies  Allergies  Allergen Reactions  . Shrimp [Shellfish Allergy] Anaphylaxis    Inpatient Medications    . anastrozole  1 mg Oral Daily  . aspirin EC  81 mg Oral Daily  . cholecalciferol  1,000 Units Oral Daily  . clonazePAM  0.5 mg Oral QHS  . docusate sodium  100 mg Oral BID  . enoxaparin (LOVENOX) injection  40 mg Subcutaneous Q24H  . famotidine (PEPCID) IV  20 mg Intravenous Q12H  . ferrous sulfate  325 mg Oral Q breakfast  . flecainide  50 mg Oral Q12H  . fludrocortisone  0.2 mg Oral Daily  . fluticasone  2 spray Each Nare Daily  . lidocaine  1 patch Transdermal Q24H  . midodrine  10 mg Oral BH-[id number]   And  . midodrine  5 mg Oral Q lunch   And  . midodrine  5 mg Oral QHS  . mirtazapine  15 mg Oral QHS  . morphine  15 mg Oral Q8H  . pantoprazole  40 mg Oral Daily  . potassium chloride  20 mEq Oral BID  . pregabalin  150 mg Oral TID  . sertraline  50 mg Oral Daily  . sodium chloride flush  3 mL Intravenous Q12H  . spironolactone  25 mg Oral Daily  . vitamin B-12  1,000 mcg Oral Daily  . vitamin C  500 mg Oral Daily    Family History    Family History  Problem Relation Age of Onset  . CAD Father     MI  . Diabetes Mellitus II Father   . Hypertension Father   . Diabetes Mellitus II Sister     Social History    Social History   Social History  . Marital status: Widowed    Spouse name: N/A  . Number of children: N/A  . Years of education: N/A   Occupational History  . Not on file.   Social History Main Topics  . Smoking status: Former Smoker    Packs/day: 0.25    Years: 1.00    Types: Cigarettes    Quit date: 11/10/1985  . Smokeless tobacco: Never Used  . Alcohol use No  . Drug use: No  . Sexual activity: Not on file   Other Topics Concern  . Not on file   Social History Narrative  . No narrative on file     Review of Systems    General:  No chills, fever, night sweats or weight changes. Positive  for generalized pain.  Cardiovascular:  No chest pain, dyspnea on exertion, edema, orthopnea, palpitations, paroxysmal nocturnal dyspnea. Dermatological: No rash, lesions/masses Respiratory: No cough, dyspnea Urologic: No hematuria, dysuria Abdominal:   No nausea, vomiting, diarrhea, bright red blood per rectum, melena, or hematemesis Neurologic:  No visual changes, wkns, changes in mental status. Positive for headaches and generalized numbness. All other systems reviewed and are otherwise  negative except as noted above.  Physical Exam    Blood pressure 112/87, pulse 72, temperature 98.3 F (36.8 C), temperature source Oral, resp. rate 16, height 5\' 6"  (1.676 m), weight 120 lb (54.4 kg), SpO2 94 %.  General: Pleasant, African American female appearing in NAD. Psych: Normal affect. Neuro: Alert and oriented X 3. Moves all extremities spontaneously. HEENT: Normal  Neck: Supple without bruits or JVD. Lungs:  Resp regular and unlabored, CTA without wheezing or rales. Heart: RRR no s3, s4, or murmurs. Tender to palpation along left pectoral region and sternum. Abdomen: Soft, non-tender, non-distended, BS + x 4.  Extremities: No clubbing, cyanosis or edema. DP/PT/Radials 2+ and equal bilaterally.  Labs    Troponin (Point of Care Test) No results for input(s): TROPIPOC in the last 72 hours.  Recent Labs  05/29/16 1254 05/29/16 1858 05/30/16 0042 05/30/16 0653  TROPONINI 0.05* 0.05* <0.03 0.03*   Lab Results  Component Value Date   WBC 7.7 05/30/2016   HGB 10.4 (L) 05/30/2016   HCT 30.6 (L) 05/30/2016   MCV 86.8 05/30/2016   PLT 199 05/30/2016    Recent Labs Lab 05/30/16 0042  NA 138  K 3.4*  CL 107  CO2 27  BUN 15  CREATININE 1.02*  CALCIUM 9.3  PROT 6.3*  BILITOT 0.6  ALKPHOS 65  ALT 5*  AST 19  GLUCOSE 103*   No results found for: CHOL, HDL, LDLCALC, TRIG No results found for: [facility]   Radiology Studies    No results found.  EKG & Cardiac Imaging      Telemetry: Sinus bradycardia, HR in 50's - 60's. No atopic events.  Echocardiogram: 12/23/2015 Study Conclusions  - Left ventricle: The cavity size was normal. Wall thickness was   normal. Systolic function was normal. The estimated ejection   fraction was in the range of 50% to 55%. Wall motion was normal;   there were no regional wall motion abnormalities. Left   ventricular diastolic function parameters were normal. - Mitral valve: There was mild regurgitation. - Left atrium: The atrium was mildly dilated. - Pulmonary arteries: Systolic pressure was within the normal   range. Assessment & Plan     1. Atypical Chest Pain - reports developing a numbness/tightness along her chest, back, and upper extremities upon stepping outside in the mid-day heat and traveling in the hot car on 7/22. This discomfort has persisted since onset. No association with exertion, somewhat worse when lying down. Also ran out of one of her two chronic pain medications two days ago as well. Pain is reproducible along left pectoral region and sternum. - recent NST in 08/2015 with no evidence of ischemia and echo in 12/2015 showing an improved EF of 50-55%. EKG with isolated TWI in V1 (similar to previous tracings), no acute ST changes. - cyclic troponin values have been flat at 0.04, 0.05, 0.05, and <0.03.  - overall her symptoms appear atypical for a cardiac etiology. Would not pursue further inpatient ischemic evaluation at this time.  2. History of NICM/ Chronic Systolic CHF - recent Lexiscan Myoview in 08/2015 with no evidence of ischemia. EF as low as 35% in the past, improved to 50-55% by echo in 12/2015. - does not appear volume overloaded on physical exam. - orthostatic hypotension does not allow for use of ACE-I/ARB. No BB secondary to bradycardia. Continue Spironolactone.   3. Paroxysmal Atrial Flutter - denies any recent palpitations. Initial EKG upon admission read as atrial flutter but clear  distinct  p-waves are present, therefore more consistent with NSR. - no episodes of PAF noted on telemetry.  - TSH pending. - This patients CHA2DS2-VASc Score and unadjusted Ischemic Stroke Rate (% per year) is equal to 2.2 % stroke rate/year from a score of 2 (CHF, Female). She refuses to be on anticoagulation at this time. Educated about PAF and its relation to increased stroke risk. - continue Flecainide 50mg  BID.  4. Chronic Pain/ Fibromyalgia - reports running out of her MS Contin two days ago and had acute worsening of her discomfort since. - per admitting team.  Signed, Erma Heritage, PA-C 05/30/2016, 8:31 AM Pager: [phone]

## 2016-05-30 NOTE — Care Management Important Message (Signed)
Important Message  Patient Details  Name: Lindsay [name] MRN: [id number] Date of Birth: 1956/08/17   Medicare Important Message Given:  Yes    Jolly Mango, RN 05/30/2016, 9:57 AM

## 2016-05-30 NOTE — Care Management (Signed)
Spoke with patient regarding a request for a cab voucher. After discussion with CSW I explained to patient that [facility] did not provide cab vouchers. Offered her a bus voucher. She declined. Offered support for any other help I could provide.

## 2016-05-30 NOTE — Progress Notes (Signed)
*  PRELIMINARY RESULTS* Echocardiogram 2D Echocardiogram has been performed.  Sherrie Sport 05/30/2016, 9:32 AM

## 2016-05-30 NOTE — Progress Notes (Signed)
Patient discharged home as ordered,instructions explained and well understood,priscription given,escorted by staff and daughter via wheel chair

## 2016-05-30 NOTE — Progress Notes (Signed)
A&O. Up with assist. Complains of chronic generalized pain. Scheduled and PRN meds given throughout the night.

## 2016-11-18 ENCOUNTER — Encounter: Payer: Self-pay | Admitting: Emergency Medicine

## 2016-11-18 ENCOUNTER — Emergency Department
Admission: EM | Admit: 2016-11-18 | Discharge: 2016-11-18 | Disposition: A | Discharge status: Home / Self Care | Payer: Medicare Other | Attending: Emergency Medicine | Admitting: Emergency Medicine

## 2016-11-18 ENCOUNTER — Emergency Department: Payer: Medicare Other

## 2016-11-18 DIAGNOSIS — Z853 Personal history of malignant neoplasm of breast: Secondary | ICD-10-CM | POA: Insufficient documentation

## 2016-11-18 DIAGNOSIS — Z79899 Other long term (current) drug therapy: Secondary | ICD-10-CM | POA: Diagnosis not present

## 2016-11-18 DIAGNOSIS — F1123 Opioid dependence with withdrawal: Secondary | ICD-10-CM | POA: Diagnosis not present

## 2016-11-18 DIAGNOSIS — Z87891 Personal history of nicotine dependence: Secondary | ICD-10-CM | POA: Diagnosis not present

## 2016-11-18 DIAGNOSIS — I5022 Chronic systolic (congestive) heart failure: Secondary | ICD-10-CM | POA: Diagnosis not present

## 2016-11-18 DIAGNOSIS — F1193 Opioid use, unspecified with withdrawal: Secondary | ICD-10-CM

## 2016-11-18 DIAGNOSIS — Z7982 Long term (current) use of aspirin: Secondary | ICD-10-CM | POA: Diagnosis not present

## 2016-11-18 DIAGNOSIS — R079 Chest pain, unspecified: Secondary | ICD-10-CM | POA: Diagnosis present

## 2016-11-18 LAB — BASIC METABOLIC PANEL
Anion gap: 7 (ref 5–15)
BUN: 15 mg/dL (ref 6–20)
CALCIUM: 10.4 mg/dL — AB (ref 8.9–10.3)
CO2: 26 mmol/L (ref 22–32)
CREATININE: 1.05 mg/dL — AB (ref 0.44–1.00)
Chloride: 105 mmol/L (ref 101–111)
GFR calc non Af Amer: 57 mL/min — ABNORMAL LOW (ref >60)
Glucose, Bld: 91 mg/dL (ref 65–99)
Potassium: 4.4 mmol/L (ref 3.5–5.1)
Sodium: 138 mmol/L (ref 135–145)

## 2016-11-18 LAB — CBC
HCT: 33 % — ABNORMAL LOW (ref 35.0–47.0)
Hemoglobin: 10.9 g/dL — ABNORMAL LOW (ref 12.0–16.0)
MCH: 28.6 pg (ref 26.0–34.0)
MCHC: 33 g/dL (ref 32.0–36.0)
MCV: 86.8 fL (ref 80.0–100.0)
PLATELETS: 238 10*3/uL (ref 150–440)
RBC: 3.8 MIL/uL (ref 3.80–5.20)
RDW: 15.3 % — AB (ref 11.5–14.5)
WBC: 6.4 10*3/uL (ref 3.6–11.0)

## 2016-11-18 LAB — TROPONIN I

## 2016-11-18 MED ORDER — SODIUM CHLORIDE 0.9 % IV SOLN
Freq: Once | INTRAVENOUS | Status: AC
  Administered 2016-11-18: 14:00:00 via INTRAVENOUS

## 2016-11-18 MED ORDER — ONDANSETRON 4 MG PO TBDP
4.0000 mg | ORAL_TABLET | Freq: Once | ORAL | Status: AC
  Administered 2016-11-18: 4 mg via ORAL

## 2016-11-18 MED ORDER — ONDANSETRON 4 MG PO TBDP
ORAL_TABLET | ORAL | Status: AC
  Administered 2016-11-18: 4 mg via ORAL
  Filled 2016-11-18: qty 1

## 2016-11-18 MED ORDER — HYDROMORPHONE HCL 1 MG/ML IJ SOLN
0.5000 mg | Freq: Once | INTRAMUSCULAR | Status: AC
  Administered 2016-11-18: 0.5 mg via INTRAVENOUS
  Filled 2016-11-18: qty 1

## 2016-11-18 MED ORDER — HYDROMORPHONE HCL 1 MG/ML IJ SOLN
1.0000 mg | Freq: Once | INTRAMUSCULAR | Status: AC
  Administered 2016-11-18: 1 mg via INTRAVENOUS
  Filled 2016-11-18: qty 1

## 2016-11-18 MED ORDER — FENTANYL 75 MCG/HR TD PT72
75.0000 ug | MEDICATED_PATCH | TRANSDERMAL | Status: DC
  Administered 2016-11-18: 75 ug via TRANSDERMAL

## 2016-11-18 MED ORDER — ONDANSETRON HCL 4 MG/2ML IJ SOLN
4.0000 mg | Freq: Once | INTRAMUSCULAR | Status: AC
  Administered 2016-11-18: 4 mg via INTRAVENOUS
  Filled 2016-11-18: qty 2

## 2016-11-18 NOTE — ED Notes (Signed)
Pt sleeping. 

## 2016-11-18 NOTE — ED Provider Notes (Signed)
[facility] Emergency Department Provider Note        Time seen: ----------------------------------------- 1:44 PM on 11/18/2016 -----------------------------------------    I have reviewed the triage vital signs and the nursing notes.   HISTORY  Chief Complaint Chest Pain and Diarrhea    HPI Lindsay [name] is a 61 y.o. female who presents to the ER being brought from home for chest pain and diarrhea for the last week. Patient states that the pain radiates to her left arm and down her left leg. She reports decreased oral intake over the past week. Patient reports initial blood pressure was markedly elevated when checked by EMS. To me she reports she has been running out of her pain medicine and has been tapering it. Last dose was this morning.   Past Medical History:  Diagnosis Date  . Anemia of chronic disease   . C. difficile diarrhea 10/2011  . Cancer ([place])   . Chronic cough   . Chronic systolic CHF (congestive heart failure) ([place])    a. echo 05/2015: EF 45-50%, nl wall motion, mod MR, nl PASP  . Depression   . Depression   . Fibromyalgia   . History of DVT (deep vein thrombosis)    a. s/p IVC filter 2014  . HX: breast cancer    a. right-side; b. s/p lumpectomy & chemo w/ Docetaxol, cytoxan, adriamycin  . Kidney stones 2013  . Malnutrition ([place])    a. chronic diarrhea s/p most meals   . Moderate mitral regurgitation   . Muscle spasm   . NICM (nonischemic cardiomyopathy) ([place])    a. Lexiscan 08/2015: no sig ischemia, no ECG changes concerning for ischemia (baseline abnormal T wave along anterolateral leads), EF 61%, low risk scan  . Orthostatic hypotension   . PAF (paroxysmal atrial fibrillation) ([place])    a. not on long term, full-dose anticoagulation since 2011, had high INR at that time 2/2 possible malnutition, has been off anticoagulation since; b. CHADS2VASc at least 2 (CHF, female); c. 48 hr Holter 09/2015 NSR w/ rare PACs, no significant  PVCs, periods of sinus tachycarida with average heart rate typically in the 60-100 range. No sig arrhythmia. No Afib seen.   . Peripheral neuropathy ([place])    a. secondary to chemo  . Postural hypotension   . S/P gastric bypass   . Vitamin D deficiency     Patient Active Problem List   Diagnosis Date Noted  . Chest pain 05/29/2016  . Elevated troponin 05/29/2016  . Atrial flutter ([place]) 05/29/2016  . Postural hypotension   . Moderate mitral regurgitation   . PAF (paroxysmal atrial fibrillation) ([place])   . Malnutrition ([place])   . S/P gastric bypass   . Fibromyalgia   . Peripheral neuropathy ([place])   . NICM (nonischemic cardiomyopathy) ([place])   . Chronic systolic CHF (congestive heart failure) ([place])   . Recurrent major depressive disorder, in partial remission ([place])   . Major depression 08/26/2015  . Weakness of both legs   . Absolute anemia   . Pain in the chest   . Orthostatic hypotension   . Paroxysmal atrial fibrillation ([facility])   . Syncope and collapse   . Paroxysmal a-fib ([place])   . Atrial fibrillation ([place]) 05/24/2015  . Abdominal pain 05/23/2015  . C. difficile colitis 12/12/2011  . Chest pain 11/11/2011  . Syncope 11/11/2011  . Hypotension 11/11/2011  . Cardiomyopathy 11/11/2011  . AF (atrial fibrillation) ([place]) 11/11/2011    Past Surgical History:  Procedure  Laterality Date  . ABDOMINAL HYSTERECTOMY    . BREAST LUMPECTOMY     right @ [facility]  . ESOPHAGOGASTRODUODENOSCOPY (EGD) WITH PROPOFOL N/A 05/25/2015   Procedure: ESOPHAGOGASTRODUODENOSCOPY (EGD) WITH PROPOFOL;  Surgeon: Manya Silvas, MD;  Location: [facility] ENDOSCOPY;  Service: Endoscopy;  Laterality: N/A;  . LITHOTRIPSY  jan 2013    Allergies Shrimp [shellfish allergy]  Social History Social History  Substance Use Topics  . Smoking status: Former Smoker    Packs/day: 0.25    Years: 1.00    Types: Cigarettes    Quit date: 11/10/1985  . Smokeless tobacco: Never Used  . Alcohol use No    Review of  Systems Constitutional: Negative for fever. Cardiovascular: Positive for chest pain Respiratory: Negative for shortness of breath. Gastrointestinal: Negative for abdominal pain, vomiting. Positive for diarrhea Genitourinary: Negative for dysuria. Musculoskeletal: Negative for back pain. Skin: Negative for rash. Neurological: Negative for headaches, positive for generalized weakness  10-point ROS otherwise negative.  ____________________________________________   PHYSICAL EXAM:  VITAL SIGNS: ED Triage Vitals  Enc Vitals Group     BP 11/18/16 1303 (!) 160/115     Pulse Rate 11/18/16 1255 76     Resp 11/18/16 1255 (!) 23     Temp --      Temp src --      SpO2 11/18/16 1251 100 %     Weight --      Height --      Head Circumference --      Peak Flow --      Pain Score 11/18/16 1256 8     Pain Loc --      Pain Edu? --      Excl. in [place]? --     Constitutional: Alert and oriented. Mild distress, cachexia Eyes: Conjunctivae are normal. PERRL. Normal extraocular movements. ENT   Head: Normocephalic and atraumatic.   Nose: No congestion/rhinnorhea.   Mouth/Throat: Mucous membranes are moist.   Neck: No stridor. Cardiovascular: Normal rate, regular rhythm. No murmurs, rubs, or gallops. Respiratory: Normal respiratory effort without tachypnea nor retractions. Breath sounds are clear and equal bilaterally. No wheezes/rales/rhonchi. Gastrointestinal: Soft and nontender. Normal bowel sounds Musculoskeletal: Nontender with normal range of motion in all extremities. No lower extremity tenderness nor edema. Neurologic:  Normal speech and language. No gross focal neurologic deficits are appreciated.  Skin:  Skin is warm, dry and intact. No rash noted. Psychiatric: Depressed mood and affect ____________________________________________  EKG: Interpreted by me. Sinus rhythm rate of 75 bpm, normal PR interval, normal QRS, normal QT, normal  axis.  ____________________________________________  ED COURSE:  Pertinent labs & imaging results that were available during my care of the patient were reviewed by me and considered in my medical decision making (see chart for details). Clinical Course   Who presents to the ER for likely opiate withdrawal. We will check basic labs, give IV narcotics and reevaluate.  Procedures ____________________________________________   LABS (pertinent positives/negatives)  Labs Reviewed  BASIC METABOLIC PANEL - Abnormal; Notable for the following:       Result Value   Creatinine, Ser 1.05 (*)    Calcium 10.4 (*)    GFR calc non Af Amer 57 (*)    All other components within normal limits  CBC - Abnormal; Notable for the following:    Hemoglobin 10.9 (*)    HCT 33.0 (*)    RDW 15.3 (*)    All other components within normal limits  TROPONIN I  ____________________________________________  FINAL ASSESSMENT AND PLAN  Opiate withdrawal  Plan: Patient with labs as dictated above. Patient is in no acute distress, currently feeling better. I will see if we can get her a fentanyl patch so that she does not come back for narcotic withdrawal in the next several days. She is stable for outpatient follow-up with her doctor. She states she will try to make the remainder of her pain medications last until her next doctor visit on the 19th.   Earleen Newport, MD   Note: This dictation was prepared with Dragon dictation. Any transcriptional errors that result from this process are unintentional    Earleen Newport, MD 11/18/16 1438

## 2016-11-18 NOTE — ED Notes (Signed)
RN in room discharging patient. Patient upset that she is not being sent home with RX for pain medication, states that Dr. Jimmye Norman told her that she would be sent home with pain medication. This RN explained to patient that I had spoken with Dr. Jimmye Norman before he left and he was not writing RX for pain medication, that she was given fentanyl patch here instead.  Patient states that she is feeling nauseous and can not go home like this. This RN spoke with Dr. Kerman Passey and obtained verbal order for Zofran 4 mg ODT.

## 2016-11-18 NOTE — ED Triage Notes (Signed)
Patient brought in by [facility] from home for chest pain and diarrhea x 1 week. Patient states that the pain radiates down her left arm and down her leg. Patient reports decreased PO intake over the past week. Patient has taken 1 81 mg Aspirin and 1 inch of nitro paste was applied by EMS. Patients initial blood pressure with EMS was 204/123, pressure decreased to 186/115 on last check by EMS.

## 2017-01-05 ENCOUNTER — Observation Stay
Admission: EM | Admit: 2017-01-05 | Discharge: 2017-01-06 | Disposition: A | Discharge status: Home / Self Care | Payer: Medicare Other | Attending: Internal Medicine | Admitting: Internal Medicine

## 2017-01-05 ENCOUNTER — Observation Stay: Payer: Medicare Other

## 2017-01-05 ENCOUNTER — Emergency Department: Payer: Medicare Other

## 2017-01-05 ENCOUNTER — Encounter: Payer: Self-pay | Admitting: Emergency Medicine

## 2017-01-05 DIAGNOSIS — Z7982 Long term (current) use of aspirin: Secondary | ICD-10-CM | POA: Diagnosis not present

## 2017-01-05 DIAGNOSIS — N179 Acute kidney failure, unspecified: Secondary | ICD-10-CM | POA: Diagnosis not present

## 2017-01-05 DIAGNOSIS — Z853 Personal history of malignant neoplasm of breast: Secondary | ICD-10-CM | POA: Insufficient documentation

## 2017-01-05 DIAGNOSIS — R200 Anesthesia of skin: Secondary | ICD-10-CM | POA: Insufficient documentation

## 2017-01-05 DIAGNOSIS — Z681 Body mass index (BMI) 19 or less, adult: Secondary | ICD-10-CM | POA: Insufficient documentation

## 2017-01-05 DIAGNOSIS — Z9884 Bariatric surgery status: Secondary | ICD-10-CM | POA: Insufficient documentation

## 2017-01-05 DIAGNOSIS — G629 Polyneuropathy, unspecified: Secondary | ICD-10-CM | POA: Insufficient documentation

## 2017-01-05 DIAGNOSIS — M79603 Pain in arm, unspecified: Secondary | ICD-10-CM

## 2017-01-05 DIAGNOSIS — R531 Weakness: Principal | ICD-10-CM | POA: Insufficient documentation

## 2017-01-05 DIAGNOSIS — D638 Anemia in other chronic diseases classified elsewhere: Secondary | ICD-10-CM | POA: Diagnosis not present

## 2017-01-05 DIAGNOSIS — Z79811 Long term (current) use of aromatase inhibitors: Secondary | ICD-10-CM | POA: Insufficient documentation

## 2017-01-05 DIAGNOSIS — M797 Fibromyalgia: Secondary | ICD-10-CM | POA: Insufficient documentation

## 2017-01-05 DIAGNOSIS — Z79891 Long term (current) use of opiate analgesic: Secondary | ICD-10-CM | POA: Diagnosis not present

## 2017-01-05 DIAGNOSIS — F329 Major depressive disorder, single episode, unspecified: Secondary | ICD-10-CM | POA: Insufficient documentation

## 2017-01-05 DIAGNOSIS — E860 Dehydration: Secondary | ICD-10-CM | POA: Diagnosis not present

## 2017-01-05 DIAGNOSIS — I5022 Chronic systolic (congestive) heart failure: Secondary | ICD-10-CM | POA: Diagnosis not present

## 2017-01-05 DIAGNOSIS — Z7951 Long term (current) use of inhaled steroids: Secondary | ICD-10-CM | POA: Insufficient documentation

## 2017-01-05 DIAGNOSIS — E46 Unspecified protein-calorie malnutrition: Secondary | ICD-10-CM | POA: Diagnosis not present

## 2017-01-05 DIAGNOSIS — Z86718 Personal history of other venous thrombosis and embolism: Secondary | ICD-10-CM | POA: Diagnosis not present

## 2017-01-05 DIAGNOSIS — M6289 Other specified disorders of muscle: Secondary | ICD-10-CM

## 2017-01-05 DIAGNOSIS — Z79899 Other long term (current) drug therapy: Secondary | ICD-10-CM | POA: Diagnosis not present

## 2017-01-05 DIAGNOSIS — I428 Other cardiomyopathies: Secondary | ICD-10-CM | POA: Insufficient documentation

## 2017-01-05 DIAGNOSIS — R29898 Other symptoms and signs involving the musculoskeletal system: Secondary | ICD-10-CM

## 2017-01-05 DIAGNOSIS — Z87891 Personal history of nicotine dependence: Secondary | ICD-10-CM | POA: Insufficient documentation

## 2017-01-05 DIAGNOSIS — I480 Paroxysmal atrial fibrillation: Secondary | ICD-10-CM | POA: Diagnosis not present

## 2017-01-05 LAB — BASIC METABOLIC PANEL
ANION GAP: 6 (ref 5–15)
BUN: 23 mg/dL — ABNORMAL HIGH (ref 6–20)
CO2: 24 mmol/L (ref 22–32)
Calcium: 9.4 mg/dL (ref 8.9–10.3)
Chloride: 111 mmol/L (ref 101–111)
Creatinine, Ser: 1.43 mg/dL — ABNORMAL HIGH (ref 0.44–1.00)
GFR, EST AFRICAN AMERICAN: 46 mL/min — AB (ref >60)
GFR, EST NON AFRICAN AMERICAN: 40 mL/min — AB (ref >60)
GLUCOSE: 78 mg/dL (ref 65–99)
POTASSIUM: 4.8 mmol/L (ref 3.5–5.1)
Sodium: 141 mmol/L (ref 135–145)

## 2017-01-05 LAB — APTT: aPTT: 29 seconds (ref 24–36)

## 2017-01-05 LAB — CBC
HCT: 34.2 % — ABNORMAL LOW (ref 35.0–47.0)
Hemoglobin: 11.1 g/dL — ABNORMAL LOW (ref 12.0–16.0)
MCH: 28.6 pg (ref 26.0–34.0)
MCHC: 32.4 g/dL (ref 32.0–36.0)
MCV: 88.4 fL (ref 80.0–100.0)
PLATELETS: 211 10*3/uL (ref 150–440)
RBC: 3.87 MIL/uL (ref 3.80–5.20)
RDW: 15.5 % — ABNORMAL HIGH (ref 11.5–14.5)
WBC: 6.4 10*3/uL (ref 3.6–11.0)

## 2017-01-05 LAB — PROTIME-INR
INR: 1.01
PROTHROMBIN TIME: 13.3 s (ref 11.4–15.2)

## 2017-01-05 LAB — TROPONIN I

## 2017-01-05 LAB — CK: Total CK: 110 U/L (ref 38–234)

## 2017-01-05 MED ORDER — LOPERAMIDE HCL 2 MG PO CAPS: 2.0000 mg | ORAL_CAPSULE | Freq: Four times a day (QID) | ORAL | Status: DC | PRN

## 2017-01-05 MED ORDER — ASPIRIN 325 MG PO TABS
325.0000 mg | ORAL_TABLET | Freq: Every day | ORAL | Status: DC
  Administered 2017-01-06: 325 mg via ORAL
  Filled 2017-01-05: qty 1

## 2017-01-05 MED ORDER — STROKE: EARLY STAGES OF RECOVERY BOOK: Freq: Once | Status: DC

## 2017-01-05 MED ORDER — PANTOPRAZOLE SODIUM 40 MG PO TBEC
40.0000 mg | DELAYED_RELEASE_TABLET | Freq: Every day | ORAL | Status: DC
  Administered 2017-01-06: 40 mg via ORAL
  Filled 2017-01-05: qty 1

## 2017-01-05 MED ORDER — SODIUM CHLORIDE 0.9 % IV BOLUS (SEPSIS)
500.0000 mL | Freq: Once | INTRAVENOUS | Status: AC
  Administered 2017-01-05: 500 mL via INTRAVENOUS

## 2017-01-05 MED ORDER — OXYCODONE HCL 5 MG PO TABS
15.0000 mg | ORAL_TABLET | Freq: Four times a day (QID) | ORAL | Status: DC | PRN
  Administered 2017-01-05 – 2017-01-06 (×3): 15 mg via ORAL
  Filled 2017-01-05 (×4): qty 3

## 2017-01-05 MED ORDER — TIZANIDINE HCL 4 MG PO TABS
4.0000 mg | ORAL_TABLET | Freq: Three times a day (TID) | ORAL | Status: DC
  Administered 2017-01-05 – 2017-01-06 (×3): 4 mg via ORAL
  Filled 2017-01-05 (×3): qty 1

## 2017-01-05 MED ORDER — FLUTICASONE PROPIONATE 50 MCG/ACT NA SUSP
1.0000 | Freq: Every day | NASAL | Status: DC
  Administered 2017-01-06: 1 via NASAL
  Filled 2017-01-05: qty 16

## 2017-01-05 MED ORDER — SENNOSIDES-DOCUSATE SODIUM 8.6-50 MG PO TABS: 1.0000 | ORAL_TABLET | Freq: Every evening | ORAL | Status: DC | PRN

## 2017-01-05 MED ORDER — ASPIRIN 300 MG RE SUPP: 300.0000 mg | Freq: Every day | RECTAL | Status: DC

## 2017-01-05 MED ORDER — MIRTAZAPINE 15 MG PO TABS
15.0000 mg | ORAL_TABLET | Freq: Every day | ORAL | Status: DC
  Administered 2017-01-05: 15 mg via ORAL
  Filled 2017-01-05: qty 1

## 2017-01-05 MED ORDER — ANASTROZOLE 1 MG PO TABS
1.0000 mg | ORAL_TABLET | Freq: Every day | ORAL | Status: DC
  Administered 2017-01-06: 1 mg via ORAL
  Filled 2017-01-05 (×2): qty 1

## 2017-01-05 MED ORDER — FLECAINIDE ACETATE 100 MG PO TABS
50.0000 mg | ORAL_TABLET | Freq: Two times a day (BID) | ORAL | Status: DC
  Administered 2017-01-05 – 2017-01-06 (×2): 50 mg via ORAL
  Filled 2017-01-05: qty 0.5
  Filled 2017-01-05: qty 1

## 2017-01-05 MED ORDER — CLONAZEPAM 0.5 MG PO TABS
0.5000 mg | ORAL_TABLET | Freq: Every day | ORAL | Status: DC
  Administered 2017-01-05: 0.5 mg via ORAL
  Filled 2017-01-05: qty 1

## 2017-01-05 MED ORDER — FLUDROCORTISONE ACETATE 0.1 MG PO TABS
0.2000 mg | ORAL_TABLET | Freq: Every day | ORAL | Status: DC
  Administered 2017-01-06: 0.2 mg via ORAL
  Filled 2017-01-05 (×2): qty 2

## 2017-01-05 MED ORDER — ASPIRIN EC 81 MG PO TBEC
162.0000 mg | DELAYED_RELEASE_TABLET | Freq: Once | ORAL | Status: AC
  Administered 2017-01-05: 162 mg via ORAL
  Filled 2017-01-05: qty 2

## 2017-01-05 MED ORDER — VITAMIN C 500 MG PO TABS
500.0000 mg | ORAL_TABLET | Freq: Every day | ORAL | Status: DC
  Administered 2017-01-06: 500 mg via ORAL
  Filled 2017-01-05: qty 1

## 2017-01-05 MED ORDER — SODIUM CHLORIDE 0.9 % IV SOLN
INTRAVENOUS | Status: DC
  Administered 2017-01-05 – 2017-01-06 (×2): via INTRAVENOUS

## 2017-01-05 MED ORDER — VITAMIN D 1000 UNITS PO TABS
1000.0000 [IU] | ORAL_TABLET | Freq: Every day | ORAL | Status: DC
  Administered 2017-01-06: 1000 [IU] via ORAL
  Filled 2017-01-05: qty 1

## 2017-01-05 MED ORDER — ONDANSETRON 4 MG PO TBDP
4.0000 mg | ORAL_TABLET | Freq: Three times a day (TID) | ORAL | Status: DC | PRN
  Filled 2017-01-05: qty 1

## 2017-01-05 MED ORDER — FERROUS SULFATE 325 (65 FE) MG PO TABS
325.0000 mg | ORAL_TABLET | Freq: Every day | ORAL | Status: DC
  Administered 2017-01-06: 325 mg via ORAL
  Filled 2017-01-05: qty 1

## 2017-01-05 MED ORDER — POTASSIUM CHLORIDE ER 10 MEQ PO TBCR
20.0000 meq | EXTENDED_RELEASE_TABLET | Freq: Two times a day (BID) | ORAL | Status: DC
  Administered 2017-01-05 – 2017-01-06 (×2): 20 meq via ORAL
  Filled 2017-01-05 (×5): qty 2

## 2017-01-05 MED ORDER — HEPARIN SODIUM (PORCINE) 5000 UNIT/ML IJ SOLN
5000.0000 [IU] | Freq: Three times a day (TID) | INTRAMUSCULAR | Status: DC
  Administered 2017-01-05 (×2): 5000 [IU] via SUBCUTANEOUS
  Filled 2017-01-05 (×2): qty 1

## 2017-01-05 MED ORDER — ACETAMINOPHEN 650 MG RE SUPP: 650.0000 mg | RECTAL | Status: DC | PRN

## 2017-01-05 MED ORDER — MORPHINE SULFATE ER 15 MG PO TBCR
15.0000 mg | EXTENDED_RELEASE_TABLET | Freq: Three times a day (TID) | ORAL | Status: DC
  Administered 2017-01-05 – 2017-01-06 (×4): 15 mg via ORAL
  Filled 2017-01-05 (×4): qty 1

## 2017-01-05 MED ORDER — LORATADINE 10 MG PO TABS: 10.0000 mg | ORAL_TABLET | Freq: Every day | ORAL | Status: DC | PRN

## 2017-01-05 MED ORDER — ACETAMINOPHEN 160 MG/5ML PO SOLN
650.0000 mg | ORAL | Status: DC | PRN
  Filled 2017-01-05: qty 20.3

## 2017-01-05 MED ORDER — PREGABALIN 50 MG PO CAPS
150.0000 mg | ORAL_CAPSULE | Freq: Three times a day (TID) | ORAL | Status: DC
  Administered 2017-01-05 – 2017-01-06 (×3): 150 mg via ORAL
  Filled 2017-01-05 (×3): qty 3

## 2017-01-05 MED ORDER — VITAMIN B-12 1000 MCG PO TABS
1000.0000 ug | ORAL_TABLET | Freq: Every day | ORAL | Status: DC
  Administered 2017-01-06: 1000 ug via ORAL
  Filled 2017-01-05: qty 1

## 2017-01-05 MED ORDER — ACETAMINOPHEN 325 MG PO TABS: 650.0000 mg | ORAL_TABLET | ORAL | Status: DC | PRN

## 2017-01-05 MED ORDER — SERTRALINE HCL 50 MG PO TABS
50.0000 mg | ORAL_TABLET | Freq: Every day | ORAL | Status: DC
  Administered 2017-01-06: 50 mg via ORAL
  Filled 2017-01-05: qty 1

## 2017-01-05 NOTE — ED Notes (Signed)
Admitting MD at bedside.

## 2017-01-05 NOTE — Consult Note (Signed)
Referring Physician: Quale    Chief Complaint: Right aided weakness  HPI: Lindsay [name] is an 61 y.o. female with multiple medical problems including peripheral neuropathy and PAF who reports going to bed last night at baseline.  Awakened this morning and noted that her right arm was weak.  She was able to ambulate with her walker but felt that her right leg was heavier as well.  With no improvement in her sypmtoms presented for evaluation.  Initial NIHSS of 0. At baseline patient with sensory abnormalities in all extremities due to her neuropathy.  Needs assistance for ambulation using a walker or wheelchair.    Date last known well: Date: 01/04/2017 Time last known well: Time: 22:30 tPA Given: No: Outside time window  Past Medical History:  Diagnosis Date  . Anemia of chronic disease   . C. difficile diarrhea 10/2011  . Cancer ([place])   . Chronic cough   . Chronic systolic CHF (congestive heart failure) ([place])    a. echo 05/2015: EF 45-50%, nl wall motion, mod MR, nl PASP  . Depression   . Depression   . Fibromyalgia   . History of DVT (deep vein thrombosis)    a. s/p IVC filter 2014  . HX: breast cancer    a. right-side; b. s/p lumpectomy & chemo w/ Docetaxol, cytoxan, adriamycin  . Kidney stones 2013  . Malnutrition ([place])    a. chronic diarrhea s/p most meals   . Moderate mitral regurgitation   . Muscle spasm   . NICM (nonischemic cardiomyopathy) ([place])    a. Lexiscan 08/2015: no sig ischemia, no ECG changes concerning for ischemia (baseline abnormal T wave along anterolateral leads), EF 61%, low risk scan  . Orthostatic hypotension   . PAF (paroxysmal atrial fibrillation) ([place])    a. not on long term, full-dose anticoagulation since 2011, had high INR at that time 2/2 possible malnutition, has been off anticoagulation since; b. CHADS2VASc at least 2 (CHF, female); c. 48 hr Holter 09/2015 NSR w/ rare PACs, no significant PVCs, periods of sinus tachycarida with average heart rate  typically in the 60-100 range. No sig arrhythmia. No Afib seen.   . Peripheral neuropathy ([place])    a. secondary to chemo  . Postural hypotension   . S/P gastric bypass   . Vitamin D deficiency     Past Surgical History:  Procedure Laterality Date  . ABDOMINAL HYSTERECTOMY    . BREAST LUMPECTOMY     right @ [facility]  . ESOPHAGOGASTRODUODENOSCOPY (EGD) WITH PROPOFOL N/A 05/25/2015   Procedure: ESOPHAGOGASTRODUODENOSCOPY (EGD) WITH PROPOFOL;  Surgeon: Manya Silvas, MD;  Location: [facility] ENDOSCOPY;  Service: Endoscopy;  Laterality: N/A;  . LITHOTRIPSY  jan 2013    Family History  Problem Relation Age of Onset  . CAD Father     MI  . Diabetes Mellitus II Father   . Hypertension Father   . Diabetes Mellitus II Sister    Social History:  reports that she quit smoking about 31 years ago. Her smoking use included Cigarettes. She has a 0.25 pack-year smoking history. She has never used smokeless tobacco. She reports that she does not drink alcohol or use drugs.  Allergies:  Allergies  Allergen Reactions  . Shrimp [Shellfish Allergy] Anaphylaxis    Medications: I have reviewed the patient's current medications. Prior to Admission:  Prior to Admission medications   Medication Sig Start Date End Date Taking? Authorizing Provider  anastrozole (ARIMIDEX) 1 MG tablet Take 1 mg by mouth daily.  Historical Provider, MD  aspirin EC 81 MG tablet Take 81 mg by mouth daily.    Historical Provider, MD  Cholecalciferol (VITAMIN D3) 1000 units CAPS Take 1,000 Units by mouth daily.    Historical Provider, MD  clonazePAM (KLONOPIN) 0.5 MG tablet Take 0.5 mg by mouth at bedtime.     Historical Provider, MD  ferrous sulfate 325 (65 FE) MG tablet Take 325 mg by mouth daily with breakfast. Reported on 11/06/2015    Historical Provider, MD  flecainide (TAMBOCOR) 100 MG tablet Take 0.5 tablets (50 mg total) by mouth every 12 (twelve) hours. 08/27/15   Fritzi Mandes, MD  fludrocortisone (FLORINEF) 0.1 MG  tablet Take 0.2 mg by mouth daily.    Historical Provider, MD  fluticasone (FLONASE) 50 MCG/ACT nasal spray Place 1 spray into the nose daily.     Historical Provider, MD  lidocaine (LIDODERM) 5 % Place 3 patches onto the skin daily. Remove & Discard patch within 12 hours or as directed by MD     Historical Provider, MD  loperamide (IMODIUM) 2 MG capsule Take 2 mg by mouth 4 (four) times daily as needed for diarrhea or loose stools.    Historical Provider, MD  loratadine (CLARITIN) 10 MG tablet Take 10 mg by mouth daily as needed for allergies.     Historical Provider, MD  midodrine (PROAMATINE) 5 MG tablet Take 5-10 mg by mouth See admin instructions. Take 2 tablets (10 mg) by mouth every morning, take 1 tablet by mouth at lunch, and 1 tablet by mouth every night at bedtime.    Historical Provider, MD  mirtazapine (REMERON) 15 MG tablet Take 15 mg by mouth at bedtime.      Historical Provider, MD  morphine (MS CONTIN) 15 MG 12 hr tablet Take 1 tablet (15 mg total) by mouth every 8 (eight) hours. 05/30/16   Vaughan Basta, MD  omeprazole (PRILOSEC) 40 MG capsule Take 40 mg by mouth daily.    Historical Provider, MD  ondansetron (ZOFRAN ODT) 4 MG disintegrating tablet Take 1 tablet (4 mg total) by mouth every 8 (eight) hours as needed for nausea or vomiting. 02/03/16   Eula Listen, MD  oxyCODONE (ROXICODONE) 15 MG immediate release tablet Take 15 mg by mouth every 6 (six) hours as needed for pain.    Historical Provider, MD  potassium chloride (K-DUR) 10 MEQ tablet Take 20 mEq by mouth 2 (two) times daily.    Historical Provider, MD  pregabalin (LYRICA) 150 MG capsule Take 150 mg by mouth 3 (three) times daily.    Historical Provider, MD  sertraline (ZOLOFT) 50 MG tablet Take 50 mg by mouth daily.    Historical Provider, MD  spironolactone (ALDACTONE) 25 MG tablet Take 25 mg by mouth daily.    Historical Provider, MD  tiZANidine (ZANAFLEX) 4 MG tablet Take 4 mg by mouth 3 (three) times  daily.     Historical Provider, MD  triamcinolone cream (KENALOG) 0.1 % Apply 1 application topically 2 (two) times daily.    Historical Provider, MD  vitamin B-12 (CYANOCOBALAMIN) 1000 MCG tablet Take 1,000 mcg by mouth daily.     Historical Provider, MD  vitamin C (ASCORBIC ACID) 500 MG tablet Take 500 mg by mouth daily.    Historical Provider, MD     ROS: History obtained from the patient  General ROS: negative for - chills, fatigue, fever, night sweats, weight gain or weight loss Psychological ROS: negative for - behavioral disorder, hallucinations, memory difficulties, mood  swings or suicidal ideation Ophthalmic ROS: negative for - blurry vision, double vision, eye pain or loss of vision ENT ROS: negative for - epistaxis, nasal discharge, oral lesions, sore throat, tinnitus or vertigo Allergy and Immunology ROS: negative for - hives or itchy/watery eyes Hematological and Lymphatic ROS: negative for - bleeding problems, bruising or swollen lymph nodes Endocrine ROS: negative for - galactorrhea, hair pattern changes, polydipsia/polyuria or temperature intolerance Respiratory ROS: negative for - cough, hemoptysis, shortness of breath or wheezing Cardiovascular ROS: negative for - chest pain, dyspnea on exertion, edema or irregular heartbeat Gastrointestinal ROS: negative for - abdominal pain, diarrhea, hematemesis, nausea/vomiting or stool incontinence Genito-Urinary ROS: negative for - dysuria, hematuria, incontinence or urinary frequency/urgency Musculoskeletal ROS: negative for - joint swelling or muscular weakness Neurological ROS: as noted in HPI Dermatological ROS: negative for rash and skin lesion changes  Physical Examination: Blood pressure 129/88, pulse (!) 54, temperature 98.2 F (36.8 C), temperature source Oral, resp. rate 20, height 5\' 6"  (1.676 m), weight 56.7 kg (125 lb), SpO2 100 %.  HEENT-  Normocephalic, no lesions, without obvious abnormality.  Normal external eye  and conjunctiva.  Normal TM's bilaterally.  Normal auditory canals and external ears. Normal external nose, mucus membranes and septum.  Normal pharynx. Cardiovascular- S1, S2 normal, pulses palpable throughout   Lungs- chest clear, no wheezing, rales, normal symmetric air entry Abdomen- soft, non-tender; bowel sounds normal; no masses,  no organomegaly Extremities- BLE edema Lymph-no adenopathy palpable Musculoskeletal-no joint tenderness, deformity or swelling Skin-warm and dry, no hyperpigmentation, vitiligo, or suspicious lesions  Neurological Examination   Mental Status: Alert, oriented, thought content appropriate.  Speech fluent without evidence of aphasia.  Able to follow 3 step commands without difficulty. Cranial Nerves: II: Discs flat bilaterally; Visual fields grossly normal, pupils equal, round, reactive to light and accommodation III,IV, VI: ptosis not present, extra-ocular motions intact bilaterally V,VII: ? Decrease in right NLF (patient without dentures), facial light touch sensation normal bilaterally VIII: hearing normal bilaterally IX,X: gag reflex present XI: bilateral shoulder shrug XII: midline tongue extension Motor: Right : Upper extremity   4/5 with significant wrist drop    Left:     Upper extremity   5/5  Lower extremity   2+/5        Lower extremity   3-/5 Tone and bulk:normal tone throughout; no atrophy noted Sensory: Pinprick and light touch impaired in all extremities, worse on right upper extremity Deep Tendon Reflexes: 1+ in the upper extremities, absent in the lower extremities Plantars: Right: mute   Left: mute Cerebellar: Normal finger-to-nose Gait: not tested due to safety concerns   Laboratory Studies:  Basic Metabolic Panel:  Recent Labs Lab 01/05/17 1332  NA 141  K 4.8  CL 111  CO2 24  GLUCOSE 78  BUN 23*  CREATININE 1.43*  CALCIUM 9.4    Liver Function Tests: No results for input(s): AST, ALT, ALKPHOS, BILITOT, PROT, ALBUMIN  in the last 168 hours. No results for input(s): LIPASE, AMYLASE in the last 168 hours. No results for input(s): AMMONIA in the last 168 hours.  CBC:  Recent Labs Lab 01/05/17 1332  WBC 6.4  HGB 11.1*  HCT 34.2*  MCV 88.4  PLT 211    Cardiac Enzymes:  Recent Labs Lab 01/05/17 1332  CKTOTAL 110  TROPONINI <0.03    BNP: Invalid input(s): POCBNP  CBG: No results for input(s): GLUCAP in the last 168 hours.  Microbiology: Results for orders placed or performed during the hospital  encounter of 02/03/16  Rapid Influenza A&B Antigens ([place] only)     Status: None   Collection Time: 02/03/16  5:26 PM  Result Value Ref Range Status   Influenza A ([facility]) NEGATIVE NEGATIVE Final   Influenza B ([facility]) NEGATIVE NEGATIVE Final    Coagulation Studies: No results for input(s): LABPROT, INR in the last 72 hours.  Urinalysis: No results for input(s): COLORURINE, LABSPEC, PHURINE, GLUCOSEU, HGBUR, BILIRUBINUR, KETONESUR, PROTEINUR, UROBILINOGEN, NITRITE, LEUKOCYTESUR in the last 168 hours.  Invalid input(s): APPERANCEUR  Lipid Panel: No results found for: CHOL, TRIG, HDL, CHOLHDL, VLDL, LDLCALC  HgbA1C: No results found for: HGBA1C  Urine Drug Screen:     Component Value Date/Time   LABOPIA POSITIVE 05/31/2012 0035   COCAINSCRNUR NEGATIVE 05/31/2012 0035   LABBENZ NEGATIVE 05/31/2012 0035   AMPHETMU NEGATIVE 05/31/2012 0035   THCU NEGATIVE 05/31/2012 0035   LABBARB NEGATIVE 05/31/2012 0035    Alcohol Level: No results for input(s): ETH in the last 168 hours.  Other results: EKG: sinus rhythm at 57 bpm.  Imaging: Ct Head Wo Contrast  Result Date: 01/05/2017 CLINICAL DATA:  Right arm and leg weakness with paresthesia. EXAM: CT HEAD WITHOUT CONTRAST TECHNIQUE: Contiguous axial images were obtained from the base of the skull through the vertex without intravenous contrast. COMPARISON:  MRI head 08/26/2015 FINDINGS: Brain: No evidence of acute infarction, hemorrhage,  hydrocephalus, extra-axial collection or mass lesion/mass effect. Vascular: No hyperdense vessel or unexpected calcification. Skull: Negative Sinuses/Orbits: Negative Other: None IMPRESSION: No acute abnormality. Electronically Signed   By: Franchot Gallo M.D.   On: 01/05/2017 13:23    Assessment: 61 y.o. female with a history of atrial fibrillation and peripheral neuropathy who presents with complaints of left sided weakness.  Exam complicated by significant sensory findings from peripheral neuropathy and chronic LE weakness but clear RUE findings.  Head CT reviewed and shows no acute changes.  Patient on ASA and no anticoagulation.  Further work up recommended.    Stroke Risk Factors - atrial fibrillation  Plan: 1. HgbA1c, fasting lipid panel 2. MRI, MRA  of the brain without contrast.  If no acute ischemia noted, symptoms may possibly be from PN and/or a more cervical etiology.  Would not proceed further with below stroke work up at that time.  Patient would need cervical imaging.   3. PT consult, OT consult, Speech consult 4. Echocardiogram 5. Carotid dopplers 6. Prophylactic therapy-continue ASA 7. NPO until RN stroke swallow screen 8. Telemetry monitoring 9. Frequent neuro checks    Alexis Goodell, MD Neurology [phone] 01/05/2017, 2:32 PM

## 2017-01-05 NOTE — ED Provider Notes (Signed)
[facility] Emergency Department Provider Note   ____________________________________________   First MD Initiated Contact with Patient 01/05/17 1238     (approximate)  I have reviewed the triage vital signs and the nursing notes.   HISTORY  Chief Complaint Right arm weakness   HPI Lindsay [name] is a 61 y.o. female multiple chronic medical issues, but she reports that she went to bed at 10:30 last night feeling normal. When she woke up this morning she noticed immediately that her right arm felt like "she had slept wrong" and it was asleep and didn't want to work well. She also feels the same way somewhat but to a less extent in the right leg. Reports a tingling unusual feeling, and a slight pain also in the right arm. No chest pain or shortness of breath. No cold or numb extremity.  Reports that this is not happening her before. She is concerned that she could've had a "stroke". She does report chronic leg weakness, but feels the right leg is more weak today than normal.  No chest pain. No shortness of breath.  Past Medical History:  Diagnosis Date  . Anemia of chronic disease   . C. difficile diarrhea 10/2011  . Cancer ([place])   . Chronic cough   . Chronic systolic CHF (congestive heart failure) ([place])    a. echo 05/2015: EF 45-50%, nl wall motion, mod MR, nl PASP  . Depression   . Depression   . Fibromyalgia   . History of DVT (deep vein thrombosis)    a. s/p IVC filter 2014  . HX: breast cancer    a. right-side; b. s/p lumpectomy & chemo w/ Docetaxol, cytoxan, adriamycin  . Kidney stones 2013  . Malnutrition ([place])    a. chronic diarrhea s/p most meals   . Moderate mitral regurgitation   . Muscle spasm   . NICM (nonischemic cardiomyopathy) ([place])    a. Lexiscan 08/2015: no sig ischemia, no ECG changes concerning for ischemia (baseline abnormal T wave along anterolateral leads), EF 61%, low risk scan  . Orthostatic hypotension   . PAF (paroxysmal  atrial fibrillation) ([place])    a. not on long term, full-dose anticoagulation since 2011, had high INR at that time 2/2 possible malnutition, has been off anticoagulation since; b. CHADS2VASc at least 2 (CHF, female); c. 48 hr Holter 09/2015 NSR w/ rare PACs, no significant PVCs, periods of sinus tachycarida with average heart rate typically in the 60-100 range. No sig arrhythmia. No Afib seen.   . Peripheral neuropathy ([place])    a. secondary to chemo  . Postural hypotension   . S/P gastric bypass   . Vitamin D deficiency     Patient Active Problem List   Diagnosis Date Noted  . Chest pain 05/29/2016  . Elevated troponin 05/29/2016  . Atrial flutter ([place]) 05/29/2016  . Postural hypotension   . Moderate mitral regurgitation   . PAF (paroxysmal atrial fibrillation) ([place])   . Malnutrition ([place])   . S/P gastric bypass   . Fibromyalgia   . Peripheral neuropathy ([place])   . NICM (nonischemic cardiomyopathy) ([place])   . Chronic systolic CHF (congestive heart failure) ([place])   . Recurrent major depressive disorder, in partial remission ([place])   . Major depression 08/26/2015  . Weakness of both legs   . Absolute anemia   . Pain in the chest   . Orthostatic hypotension   . Paroxysmal atrial fibrillation ([facility])   . Syncope and collapse   .  Paroxysmal a-fib ([place])   . Atrial fibrillation ([place]) 05/24/2015  . Abdominal pain 05/23/2015  . C. difficile colitis 12/12/2011  . Chest pain 11/11/2011  . Syncope 11/11/2011  . Hypotension 11/11/2011  . Cardiomyopathy 11/11/2011  . AF (atrial fibrillation) ([place]) 11/11/2011    Past Surgical History:  Procedure Laterality Date  . ABDOMINAL HYSTERECTOMY    . BREAST LUMPECTOMY     right @ [facility]  . ESOPHAGOGASTRODUODENOSCOPY (EGD) WITH PROPOFOL N/A 05/25/2015   Procedure: ESOPHAGOGASTRODUODENOSCOPY (EGD) WITH PROPOFOL;  Surgeon: Manya Silvas, MD;  Location[facility] ENDOSCOPY;  Service: Endoscopy;  Laterality: N/A;  . LITHOTRIPSY  jan 2013    Prior to  Admission medications   Medication Sig Start Date End Date Taking? Authorizing Provider  anastrozole (ARIMIDEX) 1 MG tablet Take 1 mg by mouth daily.      Historical Provider, MD  aspirin EC 81 MG tablet Take 81 mg by mouth daily.    Historical Provider, MD  Cholecalciferol (VITAMIN D3) 1000 units CAPS Take 1,000 Units by mouth daily.    Historical Provider, MD  clonazePAM (KLONOPIN) 0.5 MG tablet Take 0.5 mg by mouth at bedtime.     Historical Provider, MD  ferrous sulfate 325 (65 FE) MG tablet Take 325 mg by mouth daily with breakfast. Reported on 11/06/2015    Historical Provider, MD  flecainide (TAMBOCOR) 100 MG tablet Take 0.5 tablets (50 mg total) by mouth every 12 (twelve) hours. 08/27/15   Fritzi Mandes, MD  fludrocortisone (FLORINEF) 0.1 MG tablet Take 0.2 mg by mouth daily.    Historical Provider, MD  fluticasone (FLONASE) 50 MCG/ACT nasal spray Place 1 spray into the nose daily.     Historical Provider, MD  lidocaine (LIDODERM) 5 % Place 3 patches onto the skin daily. Remove & Discard patch within 12 hours or as directed by MD     Historical Provider, MD  loperamide (IMODIUM) 2 MG capsule Take 2 mg by mouth 4 (four) times daily as needed for diarrhea or loose stools.    Historical Provider, MD  loratadine (CLARITIN) 10 MG tablet Take 10 mg by mouth daily as needed for allergies.     Historical Provider, MD  midodrine (PROAMATINE) 5 MG tablet Take 5-10 mg by mouth See admin instructions. Take 2 tablets (10 mg) by mouth every morning, take 1 tablet by mouth at lunch, and 1 tablet by mouth every night at bedtime.    Historical Provider, MD  mirtazapine (REMERON) 15 MG tablet Take 15 mg by mouth at bedtime.      Historical Provider, MD  morphine (MS CONTIN) 15 MG 12 hr tablet Take 1 tablet (15 mg total) by mouth every 8 (eight) hours. 05/30/16   Vaughan Basta, MD  omeprazole (PRILOSEC) 40 MG capsule Take 40 mg by mouth daily.    Historical Provider, MD  ondansetron (ZOFRAN ODT) 4 MG  disintegrating tablet Take 1 tablet (4 mg total) by mouth every 8 (eight) hours as needed for nausea or vomiting. 02/03/16   Eula Listen, MD  oxyCODONE (ROXICODONE) 15 MG immediate release tablet Take 15 mg by mouth every 6 (six) hours as needed for pain.    Historical Provider, MD  potassium chloride (K-DUR) 10 MEQ tablet Take 20 mEq by mouth 2 (two) times daily.    Historical Provider, MD  pregabalin (LYRICA) 150 MG capsule Take 150 mg by mouth 3 (three) times daily.    Historical Provider, MD  sertraline (ZOLOFT) 50 MG tablet Take 50 mg by mouth  daily.    Historical Provider, MD  spironolactone (ALDACTONE) 25 MG tablet Take 25 mg by mouth daily.    Historical Provider, MD  tiZANidine (ZANAFLEX) 4 MG tablet Take 4 mg by mouth 3 (three) times daily.     Historical Provider, MD  triamcinolone cream (KENALOG) 0.1 % Apply 1 application topically 2 (two) times daily.    Historical Provider, MD  vitamin B-12 (CYANOCOBALAMIN) 1000 MCG tablet Take 1,000 mcg by mouth daily.     Historical Provider, MD  vitamin C (ASCORBIC ACID) 500 MG tablet Take 500 mg by mouth daily.    Historical Provider, MD    Allergies Shrimp [shellfish allergy]  Family History  Problem Relation Age of Onset  . CAD Father     MI  . Diabetes Mellitus II Father   . Hypertension Father   . Diabetes Mellitus II Sister     Social History Social History  Substance Use Topics  . Smoking status: Former Smoker    Packs/day: 0.25    Years: 1.00    Types: Cigarettes    Quit date: 11/10/1985  . Smokeless tobacco: Never Used  . Alcohol use No    Review of Systems Constitutional: No fever/chills Eyes: No visual changes. ENT: No sore throat. Cardiovascular: Denies chest pain. Respiratory: Denies shortness of breath. Gastrointestinal: No abdominal pain.  No nausea, no vomiting.   Genitourinary: Negative for dysuria. Musculoskeletal: Negative for back pain. Skin: Negative for rash. Neurological: Negative for  headaches, focal weakness or numbness.  10-point ROS otherwise negative.  ____________________________________________   PHYSICAL EXAM:  VITAL SIGNS: ED Triage Vitals  Enc Vitals Group     BP 01/05/17 1223 128/84     Pulse Rate 01/05/17 1223 (!) 58     Resp 01/05/17 1223 16     Temp 01/05/17 1230 98.2 F (36.8 C)     Temp Source 01/05/17 1230 Oral     SpO2 01/05/17 1223 100 %     Weight 01/05/17 1223 125 lb (56.7 kg)     Height 01/05/17 1223 5\' 6"  (1.676 m)     Head Circumference --      Peak Flow --      Pain Score 01/05/17 1223 9     Pain Loc --      Pain Edu? --      Excl. in [place]? --     Constitutional: Alert and oriented. Well appearing and in no acute distress.Appears chronically frail. Eyes: Conjunctivae are normal. PERRL. EOMI. Head: Atraumatic. Nose: No congestion/rhinnorhea. Mouth/Throat: Mucous membranes are moist.  Oropharynx non-erythematous. Neck: No stridor.  No cervical tenderness. No exacerbation of symptoms while turning her head. Cardiovascular: Normal rate, regular rhythm. Grossly normal heart sounds.  Good peripheral circulation. Respiratory: Normal respiratory effort.  No retractions. Lungs CTAB. Gastrointestinal: Soft and nontender. No distention.  Musculoskeletal: No lower extremity tenderness nor edema.  Neurologic:  Normal speech and language.   The patient has no pronator drift. She seems to have weakness in both upper extremities,  That appears fairly equal The patient has normal cranial nerve exam. Extraocular movements are normal. Visual fields are normal. Patient has fairly equal in strength in all extremities, about 4-5. She does demonstrate a little bit of what appears to be a right wrist drop, but when isolated she is able to extend at the wrist. She reports tingling in all fingers of the hand, and does not clearly have anyone peripheral neuropathy identifiable by me. Bilateral radial pulses strong and intact.  No cyanosis clubbing or blue  as noted in the right or left hands. No speech disturbance. No dysarthria. No aphasia. Ataxia, notably in the right upper extremity Normal finger nose finger bilat. Patient speaking in full and clear sentences.   Skin:  Skin is warm, dry and intact. No rash noted. Psychiatric: Mood and affect are normal. Speech and behavior are normal.  ____________________________________________   LABS (all labs ordered are listed, but only abnormal results are displayed)  Labs Reviewed  CBC - Abnormal; Notable for the following:       Result Value   Hemoglobin 11.1 (*)    HCT 34.2 (*)    RDW 15.5 (*)    All other components within normal limits  BASIC METABOLIC PANEL - Abnormal; Notable for the following:    BUN 23 (*)    Creatinine, Ser 1.43 (*)    GFR calc non Af Amer 40 (*)    GFR calc Af Amer 46 (*)    All other components within normal limits  CK  TROPONIN I  PROTIME-INR  APTT   ____________________________________________  EKG   ____________________________________________  RADIOLOGY  Reviewed and interpreted by me at 12:30 Heart rate 60 QRS 100 QTc 400 Normal sinus rhythm, no evidence of ischemia or ectopy noted ____________________________________________   PROCEDURES  Procedure(s) performed: None  Procedures  Critical Care performed: No  ____________________________________________   INITIAL IMPRESSION / ASSESSMENT AND PLAN / ED COURSE  Pertinent labs & imaging results that were available during my care of the patient were reviewed by me and considered in my medical decision making (see chart for details).  Somewhat unusual clinical exam, the patient does report rather abrupt onset of weakness or motor difficulty in the right upper and right lower extremity. Last seen normal was at 10:30 PM last night when she went to bed, she is not a TPA candidate. She does not demonstrate any obvious signs of a large middle cerebral artery stroke or posterior infarct  by clinical exam. We will proceed with CT of the head, and I have discussed with Dr. Doy Mince of neurology obtain a consultation for her review and further recommendations on treatment.  No cardiac or pulmonary symptoms. No infectious symptoms. Reports she is in her normal state of health last night.    ----------------------------------------- 2:48 PM on 01/05/2017 -----------------------------------------  Stable exam. Seen by Dr. Doy Mince who recommends admission for MRI, as well as occupational therapy evaluation. Rule out stroke. Patient agreeable with this plan. Passed her swallow screen.  ____________________________________________   FINAL CLINICAL IMPRESSION(S) / ED DIAGNOSES  Final diagnoses:  Weakness of right arm      NEW MEDICATIONS STARTED DURING THIS VISIT:  New Prescriptions   No medications on file     Note:  This document was prepared using Dragon voice recognition software and may include unintentional dictation errors.     Delman Kitten, MD 01/05/17 [phone]

## 2017-01-05 NOTE — ED Triage Notes (Signed)
Pt comes into the ED via EMS from home c/o right arm pain that has been intermittent for 3 months.  Patient describes the pain as numbness and a "lightening strike" down the arm.  Patient is able to move th arm with no difficulty and was negative for EMS stroke screen.  Patient denies any chest pain, shortness of breath, N/v.  Patient has even and unlabored respirations.  CBG 114 with EMS and all VS stable at this time.

## 2017-01-05 NOTE — ED Notes (Signed)
Patient given refreshments and is tolerating well at this time.

## 2017-01-05 NOTE — Progress Notes (Signed)
All daily meds held.  She says she took all her morning meds, she doesn't know what she took but she takes her meds on the schedule we set up for her.  Based on this I am assuming she took all her once a day meds.

## 2017-01-05 NOTE — H&P (Addendum)
[facility] [place] NAME: Lindsay [name]    MR#:  [id number]  DATE OF BIRTH:  01-Jun-1956  DATE OF ADMISSION:  01/05/2017  PRIMARY CARE PHYSICIAN: Pcp Not In System   REQUESTING/REFERRING PHYSICIAN: Delman Kitten, MD  CHIEF COMPLAINT:   Chief Complaint  Patient presents with  . Arm Pain   Right-sided weakness , pain, numbness and tingling today HISTORY OF PRESENT ILLNESS:  Lindsay [name]  is a 61 y.o. female with a known history of Anemia, C. difficile diarrhea, PAF and peripheral neuropathy. The patient presently ED with the above chief complaints. The patient is alert, awake and oriented. She said that she started to have right arm pain, weakness, numbness and tingling since this morning. She also complains of headache and dizziness, double vision probation. Dr. Doy Mince, neurologist see the patient and suggests placed for observation to rule out CVA. She was treated with aspirin. CT of the head didn't show any CVA.  PAST MEDICAL HISTORY:   Past Medical History:  Diagnosis Date  . Anemia of chronic disease   . C. difficile diarrhea 10/2011  . Cancer ([place])   . Chronic cough   . Chronic systolic CHF (congestive heart failure) ([place])    a. echo 05/2015: EF 45-50%, nl wall motion, mod MR, nl PASP  . Depression   . Depression   . Fibromyalgia   . History of DVT (deep vein thrombosis)    a. s/p IVC filter 2014  . HX: breast cancer    a. right-side; b. s/p lumpectomy & chemo w/ Docetaxol, cytoxan, adriamycin  . Kidney stones 2013  . Malnutrition ([place])    a. chronic diarrhea s/p most meals   . Moderate mitral regurgitation   . Muscle spasm   . NICM (nonischemic cardiomyopathy) ([place])    a. Lexiscan 08/2015: no sig ischemia, no ECG changes concerning for ischemia (baseline abnormal T wave along anterolateral leads), EF 61%, low risk scan  . Orthostatic hypotension   . PAF (paroxysmal atrial fibrillation) ([place])    a. not on long term,  full-dose anticoagulation since 2011, had high INR at that time 2/2 possible malnutition, has been off anticoagulation since; b. CHADS2VASc at least 2 (CHF, female); c. 48 hr Holter 09/2015 NSR w/ rare PACs, no significant PVCs, periods of sinus tachycarida with average heart rate typically in the 60-100 range. No sig arrhythmia. No Afib seen.   . Peripheral neuropathy ([place])    a. secondary to chemo  . Postural hypotension   . S/P gastric bypass   . Vitamin D deficiency     PAST SURGICAL HISTORY:   Past Surgical History:  Procedure Laterality Date  . ABDOMINAL HYSTERECTOMY    . BREAST LUMPECTOMY     right @ [facility]  . ESOPHAGOGASTRODUODENOSCOPY (EGD) WITH PROPOFOL N/A 05/25/2015   Procedure: ESOPHAGOGASTRODUODENOSCOPY (EGD) WITH PROPOFOL;  Surgeon: Manya Silvas, MD;  Location: [facility] ENDOSCOPY;  Service: Endoscopy;  Laterality: N/A;  . LITHOTRIPSY  jan 2013    SOCIAL HISTORY:   Social History  Substance Use Topics  . Smoking status: Former Smoker    Packs/day: 0.25    Years: 1.00    Types: Cigarettes    Quit date: 11/10/1985  . Smokeless tobacco: Never Used  . Alcohol use No    FAMILY HISTORY:   Family History  Problem Relation Age of Onset  . CAD Father     MI  . Diabetes Mellitus II Father   . Hypertension  Father   . Diabetes Mellitus II Sister     DRUG ALLERGIES:   Allergies  Allergen Reactions  . Shrimp [Shellfish Allergy] Anaphylaxis    REVIEW OF SYSTEMS:   Review of Systems  Constitutional: Positive for malaise/fatigue. Negative for chills and fever.  HENT: Negative for congestion.   Eyes: Positive for blurred vision and double vision.  Respiratory: Negative for cough, shortness of breath and stridor.   Cardiovascular: Negative for chest pain, palpitations and leg swelling.  Gastrointestinal: Negative for abdominal pain, blood in stool, constipation, diarrhea, melena, nausea and vomiting.  Genitourinary: Negative for dysuria and hematuria.    Musculoskeletal: Negative for back pain.  Skin: Negative for itching and rash.  Neurological: Positive for dizziness, tingling, sensory change, focal weakness, weakness and headaches. Negative for tremors, speech change, seizures and loss of consciousness.  Psychiatric/Behavioral: Negative for depression. The patient is nervous/anxious.     MEDICATIONS AT HOME:   Prior to Admission medications   Medication Sig Start Date End Date Taking? Authorizing Provider  anastrozole (ARIMIDEX) 1 MG tablet Take 1 mg by mouth daily.      Historical Provider, MD  aspirin EC 81 MG tablet Take 81 mg by mouth daily.    Historical Provider, MD  Cholecalciferol (VITAMIN D3) 1000 units CAPS Take 1,000 Units by mouth daily.    Historical Provider, MD  clonazePAM (KLONOPIN) 0.5 MG tablet Take 0.5 mg by mouth at bedtime.     Historical Provider, MD  ferrous sulfate 325 (65 FE) MG tablet Take 325 mg by mouth daily with breakfast. Reported on 11/06/2015    Historical Provider, MD  flecainide (TAMBOCOR) 100 MG tablet Take 0.5 tablets (50 mg total) by mouth every 12 (twelve) hours. 08/27/15   Fritzi Mandes, MD  fludrocortisone (FLORINEF) 0.1 MG tablet Take 0.2 mg by mouth daily.    Historical Provider, MD  fluticasone (FLONASE) 50 MCG/ACT nasal spray Place 1 spray into the nose daily.     Historical Provider, MD  lidocaine (LIDODERM) 5 % Place 3 patches onto the skin daily. Remove & Discard patch within 12 hours or as directed by MD     Historical Provider, MD  loperamide (IMODIUM) 2 MG capsule Take 2 mg by mouth 4 (four) times daily as needed for diarrhea or loose stools.    Historical Provider, MD  loratadine (CLARITIN) 10 MG tablet Take 10 mg by mouth daily as needed for allergies.     Historical Provider, MD  midodrine (PROAMATINE) 5 MG tablet Take 5-10 mg by mouth See admin instructions. Take 2 tablets (10 mg) by mouth every morning, take 1 tablet by mouth at lunch, and 1 tablet by mouth every night at bedtime.     Historical Provider, MD  mirtazapine (REMERON) 15 MG tablet Take 15 mg by mouth at bedtime.      Historical Provider, MD  morphine (MS CONTIN) 15 MG 12 hr tablet Take 1 tablet (15 mg total) by mouth every 8 (eight) hours. 05/30/16   Vaughan Basta, MD  omeprazole (PRILOSEC) 40 MG capsule Take 40 mg by mouth daily.    Historical Provider, MD  ondansetron (ZOFRAN ODT) 4 MG disintegrating tablet Take 1 tablet (4 mg total) by mouth every 8 (eight) hours as needed for nausea or vomiting. 02/03/16   Eula Listen, MD  oxyCODONE (ROXICODONE) 15 MG immediate release tablet Take 15 mg by mouth every 6 (six) hours as needed for pain.    Historical Provider, MD  potassium chloride (K-DUR) 10 MEQ  tablet Take 20 mEq by mouth 2 (two) times daily.    Historical Provider, MD  pregabalin (LYRICA) 150 MG capsule Take 150 mg by mouth 3 (three) times daily.    Historical Provider, MD  sertraline (ZOLOFT) 50 MG tablet Take 50 mg by mouth daily.    Historical Provider, MD  spironolactone (ALDACTONE) 25 MG tablet Take 25 mg by mouth daily.    Historical Provider, MD  tiZANidine (ZANAFLEX) 4 MG tablet Take 4 mg by mouth 3 (three) times daily.     Historical Provider, MD  triamcinolone cream (KENALOG) 0.1 % Apply 1 application topically 2 (two) times daily.    Historical Provider, MD  vitamin B-12 (CYANOCOBALAMIN) 1000 MCG tablet Take 1,000 mcg by mouth daily.     Historical Provider, MD  vitamin C (ASCORBIC ACID) 500 MG tablet Take 500 mg by mouth daily.    Historical Provider, MD      VITAL SIGNS:  Blood pressure (!) 147/94, pulse 60, temperature 98.2 F (36.8 C), temperature source Oral, resp. rate 15, height 5\' 6"  (1.676 m), weight 125 lb (56.7 kg), SpO2 100 %.  PHYSICAL EXAMINATION:  Physical Exam  GENERAL:  61 y.o.-year-old patient lying in the bed with no acute distress. Malnutrition. EYES: Pupils equal, round, reactive to light and accommodation. No scleral icterus. Extraocular muscles intact.    HEENT: Head atraumatic, normocephalic. Oropharynx and nasopharynx clear.  NECK:  Supple, no jugular venous distention. No thyroid enlargement, no tenderness.  LUNGS: Normal breath sounds bilaterally, no wheezing, rales,rhonchi or crepitation. No use of accessory muscles of respiration.  CARDIOVASCULAR: S1, S2 normal. No murmurs, rubs, or gallops.  ABDOMEN: Soft, nontender, nondistended. Bowel sounds present. No organomegaly or mass.  EXTREMITIES: No pedal edema, cyanosis, or clubbing.  NEUROLOGIC: Cranial nerves II through XII are intact. Muscle strength 5/5 in all extremities except 4/5 in the right arm. Tenderness on touch on right arm and leg. Gait not checked.  PSYCHIATRIC: The patient is alert and oriented x 3.  SKIN: No obvious rash, lesion, or ulcer.   LABORATORY PANEL:   CBC  Recent Labs Lab 01/05/17 1332  WBC 6.4  HGB 11.1*  HCT 34.2*  PLT 211   ------------------------------------------------------------------------------------------------------------------  Chemistries   Recent Labs Lab 01/05/17 1332  NA 141  K 4.8  CL 111  CO2 24  GLUCOSE 78  BUN 23*  CREATININE 1.43*  CALCIUM 9.4   ------------------------------------------------------------------------------------------------------------------  Cardiac Enzymes  Recent Labs Lab 01/05/17 1332  TROPONINI <0.03   ------------------------------------------------------------------------------------------------------------------  RADIOLOGY:  Ct Head Wo Contrast  Result Date: 01/05/2017 CLINICAL DATA:  Right arm and leg weakness with paresthesia. EXAM: CT HEAD WITHOUT CONTRAST TECHNIQUE: Contiguous axial images were obtained from the base of the skull through the vertex without intravenous contrast. COMPARISON:  MRI head 08/26/2015 FINDINGS: Brain: No evidence of acute infarction, hemorrhage, hydrocephalus, extra-axial collection or mass lesion/mass effect. Vascular: No hyperdense vessel or unexpected  calcification. Skull: Negative Sinuses/Orbits: Negative Other: None IMPRESSION: No acute abnormality. Electronically Signed   By: Franchot Gallo M.D.   On: 01/05/2017 13:23      IMPRESSION AND PLAN:   Right-sided weakness and numbness, rule out CVA. The patient will be placed for observation. We will get MRI/MRA of the brain, carotid duplex, echocardiogram, PT and OT, neuro check.  Continue aspirin and statin.  Acute renal failure due to dehydration. Start IV fluid support and follow-up BMP. Hold the spironolactone.  History of PAF. Now in sinus rhythm. Continue aspirin and home medication.  Peripheral neuropathy. Continue home medication. Malnutrition. Dietitian consult.  All the records are reviewed and case discussed with ED provider. Management plans discussed with the patient, family and they are in agreement.  CODE STATUS: Full code  TOTAL TIME TAKING CARE OF THIS PATIENT: 52 minutes.    Demetrios Loll M.D on 01/05/2017 at 3:11 PM  Between 7am to 6pm - Pager - [phone]  After 6pm go to [email or web address] - Proofreader  [facility]  Office  [phone]  CC: Primary care physician; Pcp Not In System   Note: This dictation was prepared with Dragon dictation along with smaller phrase technology. Any transcriptional errors that result from this process are unintentional.

## 2017-01-05 NOTE — Plan of Care (Signed)
Problem: Education: Goal: Knowledge of [facility] Education information/materials will improve Outcome: Progressing Oriented to room and unit, food  Service.  Plan of care discussed.

## 2017-01-05 NOTE — Progress Notes (Signed)
Patient complaining of continued pain in right arm. Pain medicine was given at 1740 and patient prefers to take PRN pain medicine with scheduled pain medicine. PRN cannot be given again until 2340 and patient is agreeable. Notified MD Hugelmeyer of patient's continued complaint of pain in the right arm. MD placed orders for xrays of the shoulder and upper arm. Nursing staff will continue to monitor for any changes in patient status. Earleen Reaper, RN

## 2017-01-06 ENCOUNTER — Observation Stay: Payer: Medicare Other

## 2017-01-06 ENCOUNTER — Observation Stay
Admit: 2017-01-06 | Discharge: 2017-01-06 | Disposition: A | Discharge status: Home / Self Care | Payer: Medicare Other | Attending: Internal Medicine | Admitting: Internal Medicine

## 2017-01-06 DIAGNOSIS — R29898 Other symptoms and signs involving the musculoskeletal system: Secondary | ICD-10-CM

## 2017-01-06 DIAGNOSIS — R531 Weakness: Secondary | ICD-10-CM | POA: Diagnosis not present

## 2017-01-06 LAB — LIPID PANEL
CHOL/HDL RATIO: 2.4 ratio
CHOLESTEROL: 168 mg/dL (ref 0–200)
HDL: 69 mg/dL (ref >40)
LDL Cholesterol: 84 mg/dL (ref 0–99)
Triglycerides: 75 mg/dL (ref <150)
VLDL: 15 mg/dL (ref 0–40)

## 2017-01-06 LAB — ECHOCARDIOGRAM COMPLETE
Height: 66 in
WEIGHTICAEL: 1910.4 [oz_av]

## 2017-01-06 LAB — HIV ANTIBODY (ROUTINE TESTING W REFLEX): HIV Screen 4th Generation wRfx: NONREACTIVE

## 2017-01-06 MED ORDER — LORAZEPAM 2 MG/ML IJ SOLN
1.0000 mg | Freq: Once | INTRAMUSCULAR | Status: AC
  Administered 2017-01-06: 1 mg via INTRAVENOUS
  Filled 2017-01-06: qty 1

## 2017-01-06 NOTE — Discharge Instructions (Signed)
Dehydration, Adult Dehydration is when there is not enough fluid or water in your body. This happens when you lose more fluids than you take in. Dehydration can range from mild to very bad. It should be treated right away to keep it from getting very bad. Symptoms of mild dehydration may include:   Thirst.  Dry lips.  Slightly dry mouth.  Dry, warm skin.  Dizziness. Symptoms of moderate dehydration may include:   Very dry mouth.  Muscle cramps.  Dark pee (urine). Pee may be the color of tea.  Your body making less pee.  Your eyes making fewer tears.  Heartbeat that is uneven or faster than normal (palpitations).  Headache.  Light-headedness, especially when you stand up from sitting.  Fainting (syncope). Symptoms of very bad dehydration may include:   Changes in skin, such as:  Cold and clammy skin.  Blotchy (mottled) or pale skin.  Skin that does not quickly return to normal after being lightly pinched and let go (poor skin turgor).  Changes in body fluids, such as:  Feeling very thirsty.  Your eyes making fewer tears.  Not sweating when body temperature is high, such as in hot weather.  Your body making very little pee.  Changes in vital signs, such as:  Weak pulse.  Pulse that is more than 100 beats a minute when you are sitting still.  Fast breathing.  Low blood pressure.  Other changes, such as:  Sunken eyes.  Cold hands and feet.  Confusion.  Lack of energy (lethargy).  Trouble waking up from sleep.  Short-term weight loss.  Unconsciousness. Follow these instructions at home:  If told by your doctor, drink an ORS:  Make an ORS by using instructions on the package.  Start by drinking small amounts, about  cup (120 mL) every 5-10 minutes.  Slowly drink more until you have had the amount that your doctor said to have.  Drink enough clear fluid to keep your pee clear or pale yellow. If you were told to drink an ORS, finish the  ORS first, then start slowly drinking clear fluids. Drink fluids such as:  Water. Do not drink only water by itself. Doing that can make the salt (sodium) level in your body get too low (hyponatremia).  Ice chips.  Fruit juice that you have added water to (diluted).  Low-calorie sports drinks.  Avoid:  Alcohol.  Drinks that have a lot of sugar. These include high-calorie sports drinks, fruit juice that does not have water added, and soda.  Caffeine.  Foods that are greasy or have a lot of fat or sugar.  Take over-the-counter and prescription medicines only as told by your doctor.  Do not take salt tablets. Doing that can make the salt level in your body get too high (hypernatremia).  Eat foods that have minerals (electrolytes). Examples include bananas, oranges, potatoes, tomatoes, and spinach.  Keep all follow-up visits as told by your doctor. This is important. Contact a doctor if:  You have belly (abdominal) pain that:  Gets worse.  Stays in one area (localizes).  You have a rash.  You have a stiff neck.  You get angry or annoyed more easily than normal (irritability).  You are more sleepy than normal.  You have a harder time waking up than normal.  You feel:  Weak.  Dizzy.  Very thirsty.  You have peed (urinated) only a small amount of very dark pee during 6-8 hours. Get help right away if:  You  have symptoms of very bad dehydration.  You cannot drink fluids without throwing up (vomiting).  Your symptoms get worse with treatment.  You have a fever.  You have a very bad headache.  You are throwing up or having watery poop (diarrhea) and it:  Gets worse.  Does not go away.  You have blood or something green (bile) in your throw-up.  You have blood in your poop (stool). This may cause poop to look black and tarry.  You have not peed in 6-8 hours.  You pass out (faint).  Your heart rate when you are sitting still is more than 100 beats a  minute.  You have trouble breathing. This information is not intended to replace advice given to you by your health care provider. Make sure you discuss any questions you have with your health care provider. Document Released: 08/20/2009 Document Revised: 05/13/2016 Document Reviewed: 12/18/2015 Elsevier Interactive Patient Education  2017 [employer] Weakness Weakness is a lack of strength. You may feel weak all over your body (generalized), or you may feel weak in one specific part of your body (focal). Common causes of weakness include:  Infection and immune system disorders.  Physical exhaustion.  Internal bleeding or other blood loss that results in a lack of red blood cells (anemia).  Dehydration.  An imbalance in mineral (electrolyte) levels, such as potassium.  Heart disease, circulation problems, or stroke. Other causes include:  Some medicines or cancer treatment.  Stress, anxiety, or depression.  Nervous system disorders.  Thyroid disorders.  Loss of muscle strength because of age or inactivity.  Poor sleep quality or sleep disorders. The cause of your weakness may not be known. Some causes of weakness can be serious, so it is important to see your health care provider. Follow these instructions at home:  Rest as needed.  Try to get enough sleep. Talk to your health care provider about how much sleep you need each night.  Take over-the-counter and prescription medicines only as told by your health care provider.  Eat a healthy, well-balanced diet. This includes:  Proteins to build muscles, such as lean meats and fish.  Fresh fruits and vegetables.  Carbohydrates to boost energy, such as whole grains.  Drink enough fluid to keep your urine clear or pale yellow.  Do strength exercises, such as arm curls and leg raises, for 30 minutes at least 2 days a week or as told by your health care provider.  Consider working with a physical therapist or trainer  who can develop an exercise plan to help you gain muscle strength.  Keep all follow-up visits as told by your health care provider. This is important. Contact a health care provider if:  Your weakness does not improve or gets worse.  Your weakness affects your ability to think clearly.  Your weakness affects your ability to do your normal daily activities. Get help right away if:  You develop sudden weakness.  You have trouble breathing or shortness of breath.  You have problems with your vision.  You have trouble talking or swallowing.  You have trouble standing or walking.  You have chest pain.  You are light-headed or lose consciousness. This information is not intended to replace advice given to you by your health care provider. Make sure you discuss any questions you have with your health care provider. Document Released: 10/24/2005 Document Revised: 11/19/2015 Document Reviewed: 08/14/2015 Elsevier Interactive Patient Education  2017 [employer].

## 2017-01-06 NOTE — Progress Notes (Signed)
SLP Cancellation Note  Patient Details Name: Lindsay [name] MRN: [id number] DOB: 1956/05/19   Cancelled treatment:       Reason Eval/Treat Not Completed: SLP screened, no needs identified, will sign off (NSG consulted, chart reviewed ). Pt denied any trouble or concerns with speech/langauge stating she is at her baseline- "I can speak just fine". Pt able to communicated wants/needs with NSG. Consulted NSG with no concerns of pt's speech/language or swallowing. No skilled ST services indicated, NSG to re-consult if any change in status.    Eulogio Ditch, B.S Graduate Clinician  01/06/2017, 11:41 AM   This information has been reviewed and agreed upon by this supervising clinician.  Orinda Kenner, [place], CCC-SLP

## 2017-01-06 NOTE — Evaluation (Signed)
Physical Therapy Evaluation Patient Details Name: Lindsay [name] MRN: [id number] DOB: [birth year] Today's Date: 01/06/2017   History of Present Illness  Pt. is a 61 y.o. female who was admitted to [facility] with RUE weakness. Pt. PMHx includes: anemia, C. Difficile, Diarrhea, PAF, and Peripheral Neuropathy, and A-Fib.  Clinical Impression  Pt is a pleasant 61 year old female who was admitted for R UE weakness. Have ruled out CVA at this time. Pt reports weakness has improved compared to yesterday. Coordination grossly WNL, however ataxic gait noted with increased ambulation distance using RW.  Pt performs bed mobility with mod I, transfers with cga, and ambulation with min assist and RW in room progressing to [place] in hallway. Pt demonstrates deficits with strength/mobility/endurance. Reports she has support at home and is close to baseline level. Would benefit from skilled PT to address above deficits and promote optimal return to PLOF. Recommend transition to [facility] upon discharge from acute hospitalization.       Follow Up Recommendations Home health PT    Equipment Recommendations  None recommended by PT    Recommendations for Other Services       Precautions / Restrictions Precautions Precautions: Fall Restrictions Weight Bearing Restrictions: No      Mobility  Bed Mobility Overal bed mobility: Modified Independent             General bed mobility comments: safe technique performed with pt able to sit at EOB with upright posture  Transfers Overall transfer level: Needs assistance Equipment used: Rolling walker (2 wheeled) Transfers: Sit to/from Stand Sit to Stand: Min guard         General transfer comment: Safe technique with RW and upright posture. Pt slightly impulsive and tries to get up prior to therapist instruction  Ambulation/Gait Ambulation/Gait assistance: Min assist Ambulation Distance (Feet): 40 Feet Assistive device: Rolling walker (2 wheeled) Gait  Pattern/deviations: Step-through pattern     General Gait Details: Pt ambulated in room with slow gait speed, however no LOB noted. Pt fatigues and request seated rest break.  Stairs            Wheelchair Mobility    Modified Rankin (Stroke Patients Only)       Balance Overall balance assessment: Needs assistance;History of Falls Sitting-balance support: Feet supported;Single extremity supported Sitting balance-Leahy Scale: Good     Standing balance support: Bilateral upper extremity supported Standing balance-Leahy Scale: Fair                               Pertinent Vitals/Pain Pain Assessment: Faces Pain Score: 8  Faces Pain Scale: Hurts little more Pain Location: B hip spasm Pain Descriptors / Indicators: Spasm Pain Intervention(s): Limited activity within patient's tolerance    Home Living Family/patient expects to be discharged to:: Private residence Living Arrangements: Alone Available Help at Discharge: Neighbor;Personal care attendant Type of Home: Apartment Home Access: Level entry     Home Layout: One level Home Equipment: Walker - 4 wheels;Walker - 2 wheels;Wheelchair - Education officer, community - power;Cane - single point      Prior Function Level of Independence: Needs assistance      ADL's / Homemaking Assistance Needed: Pt. has daily personal care aides to assist with ADLs.         Hand Dominance   Dominant Hand: Right    Extremity/Trunk Assessment   Upper Extremity Assessment Upper Extremity Assessment: Generalized weakness;Defer to OT evaluation RUE Deficits / Details: 4/5  UE strength shoulder flexion, abduction. 4/elbow flexion, extension, wrist extension. Grip strength: Right: 20#, Left 40# RUE Coordination: decreased fine motor    Lower Extremity Assessment Lower Extremity Assessment: Generalized weakness;RLE deficits/detail (L LE grossly WNL) RLE Deficits / Details: grossly 4/5 RLE Sensation: history of peripheral  neuropathy RLE Coordination: decreased gross motor       Communication   Communication: No difficulties  Cognition Arousal/Alertness: Awake/alert Behavior During Therapy: WFL for tasks assessed/performed Overall Cognitive Status: Within Functional Limits for tasks assessed                      General Comments      Exercises Other Exercises Other Exercises: Pt ambulated in hallway x 100' with RW and with fatigue starts to perform cross over gait with slight ataxia. Pt reports this is her baseline. Only requires cga for ambulation using RW.   Assessment/Plan    PT Assessment Patient needs continued PT services  PT Problem List Decreased strength;Decreased balance;Decreased mobility;Decreased safety awareness       PT Treatment Interventions Gait training;DME instruction;Therapeutic exercise    PT Goals (Current goals can be found in the Care Plan section)  Acute Rehab PT Goals Patient Stated Goal: To return home PT Goal Formulation: With patient Time For Goal Achievement: 01/20/17 Potential to Achieve Goals: Good    Frequency Min 2X/week   Barriers to discharge        Co-evaluation               End of Session Equipment Utilized During Treatment: Gait belt Activity Tolerance: Patient tolerated treatment well Patient left: in chair;with chair alarm set;with nursing/sitter in room Nurse Communication: Mobility status PT Visit Diagnosis: Unsteadiness on feet (R26.81);Repeated falls (R29.6);Muscle weakness (generalized) (M62.81);Ataxic gait (R26.0)    Functional Assessment Tool Used: AM-PAC 6 Clicks Basic Mobility Functional Limitation: Mobility: Walking and moving around Mobility: Walking and Moving Around Current Status [id number]): At least 20 percent but less than 40 percent impaired, limited or restricted Mobility: Walking and Moving Around Goal Status [phone]): At least 1 percent but less than 20 percent impaired, limited or restricted    Time:  1002-1020 PT Time Calculation (min) (ACUTE ONLY): 18 min   Charges:   PT Evaluation $PT Eval Low Complexity: 1 Procedure PT Treatments $Gait Training: 8-22 mins   PT G Codes:   PT G-Codes **NOT FOR INPATIENT CLASS** Functional Assessment Tool Used: AM-PAC 6 Clicks Basic Mobility Functional Limitation: Mobility: Walking and moving around Mobility: Walking and Moving Around Current Status [id number]): At least 20 percent but less than 40 percent impaired, limited or restricted Mobility: Walking and Moving Around Goal Status [phone]): At least 1 percent but less than 20 percent impaired, limited or restricted     Cristoval Teall 01/06/2017, 4:27 PM  Greggory Stallion, PT, DPT [phone]

## 2017-01-06 NOTE — Evaluation (Signed)
Occupational Therapy Evaluation Patient Details Name: Lindsay [name] MRN: [id number] DOB: 06-Apr-1956 Today's Date: 01/06/2017    History of Present Illness Pt. is a 61 y.o. female who was admitted to [facility] with RUE weakness. Pt. PMHx includes: anemia, C. Difficile, Diarrhea, PAF, and Peripheral Neuropathy, and A-Fib.   Clinical Impression   Pt. Is a 60 y. O. Female who was admitted to [facility] with RUE weakness. Pt. presents with impaired RIght UE strength, grip strength, and coordination skills which hinder her ability to complete ADL, and IADL functioning. Pt. could benefit from skilled OT services for ADL training, A/E training, UE there. Ex.,  neuromuscular re-ed, pt. education about home modification, and DME. Pt. could benefit from outpatient OT services to work on improving  LUE functioning for ADL, and IADLs.    Follow Up Recommendations  Outpatient OT    Equipment Recommendations       Recommendations for Other Services       Precautions / Restrictions                                                       ADL Overall ADL's : Needs assistance/impaired Eating/Feeding: Set up       Upper Body Bathing: Set up       Upper Body Dressing : Min guard   Lower Body Dressing: Min guard                 General ADL Comments: Pt. eductaion was provided about ADLs, home set-up, and daily routines, and LUE functioning.     Vision         Perception     Praxis      Pertinent Vitals/Pain Pain Assessment: 0-10 Pain Score: 8  Pain Location: all over Pain Descriptors / Indicators: Aching Pain Intervention(s): Limited activity within patient's tolerance;Monitored during session     Hand Dominance Right   Extremity/Trunk Assessment Upper Extremity Assessment Upper Extremity Assessment: Generalized weakness;RUE deficits/detail (Left WFL) RUE Deficits / Details: 4/5 UE strength shoulder flexion, abduction. 4/elbow flexion, extension, wrist  extension. Grip strength: Right: 20#, Left 40# RUE Coordination: decreased fine motor           Communication Communication Communication: No difficulties   Cognition Arousal/Alertness: Awake/alert Behavior During Therapy: WFL for tasks assessed/performed Overall Cognitive Status: Within Functional Limits for tasks assessed                     General Comments       Exercises       Shoulder Instructions      Home Living Family/patient expects to be discharged to:: Private residence Living Arrangements: Alone Available Help at Discharge: Neighbor Type of Home: Apartment Home Access: Level entry     Home Layout: One [place]: Environmental consultant - 4 wheels;Walker - 2 wheels;Wheelchair - Education officer, community - power;Cane - single point          Prior Functioning/Environment Level of Independence: Needs assistance    ADL's / Homemaking Assistance Needed: Pt. has daily personal care aides to assist with ADLs.             OT Problem List: Decreased strength;Pain;Impaired UE functional use;Impaired vision/perception;Decreased activity tolerance      OT Treatment/Interventions:  Self-care/ADL training;Therapeutic exercise;Patient/family education;Energy conservation;DME and/or AE instruction    OT Goals(Current goals can be found in the care plan section) Acute Rehab OT Goals Patient Stated Goal: To return home OT Goal Formulation: With patient Potential to Achieve Goals: Good  OT Frequency: Min 1X/week   Barriers to D/C:            Co-evaluation              End of Session    Activity Tolerance: Patient tolerated treatment well Patient left: in bed;with call bell/phone within reach  OT Visit Diagnosis: Muscle weakness (generalized) (M62.81)                ADL either performed or assessed with clinical judgement  Time: 0208-0229 OT Time Calculation (min): 21 min Charges:  OT General Charges $OT Visit: 1 Procedure OT  Evaluation $OT Eval Moderate Complexity: 1 Procedure G-Codes: OT G-codes **NOT FOR INPATIENT CLASS** Functional Assessment Tool Used: AM-PAC 6 Clicks Daily Activity;Clinical judgement Functional Limitation: Self care Self Care Current Status [id number]): At least 1 percent but less than 20 percent impaired, limited or restricted Self Care Goal Status [id number]): At least 1 percent but less than 20 percent impaired, limited or restricted   Harrel Carina, MS, OTR/L  Harrel Carina, MS, OTR/L 01/06/2017, 3:45 PM

## 2017-01-06 NOTE — Progress Notes (Signed)
*  PRELIMINARY RESULTS* Echocardiogram 2D Echocardiogram has been performed.  Sherrie Sport 01/06/2017, 8:44 AM

## 2017-01-06 NOTE — Care Management (Signed)
Patient presents from home with sx concerning for cva.  Has ruled out for cva.  has weakness in her right hand.  Physical therapy has recommended home health.  Referral for physical therapy and occupational therapy called to [facility].   Patient has medicaid pcs services 7 days a week.  She is current with her PCP Dr Raliegh Ip at [facility].  Patient does not have transportation home.  Spoke with CSW as patient has medicare/medicaid transportation.  CM left message with medicaid transportation. Patient has called family and friends and no one can transport her home.   Ended up providing patient with taxi voucher

## 2017-01-06 NOTE — Discharge Summary (Signed)
[facility] NAME: Lindsay [name]    MR#:  [id number]  DATE OF BIRTH:  June 25, 1956  DATE OF ADMISSION:  01/05/2017 ADMITTING PHYSICIAN: Demetrios Loll, MD  DATE OF DISCHARGE:01/06/2017  PRIMARY CARE PHYSICIAN: Pcp Not In System    ADMISSION DIAGNOSIS:  Weakness of right arm [R29.898]  DISCHARGE DIAGNOSIS:  Active Problems:   Right arm weakness   SECONDARY DIAGNOSIS:   Past Medical History:  Diagnosis Date  . Anemia of chronic disease   . C. difficile diarrhea 10/2011  . Cancer ([place])   . Chronic cough   . Chronic systolic CHF (congestive heart failure) ([name])    a. echo 05/2015: EF 45-50%, nl wall motion, mod MR, nl PASP  . Depression   . Depression   . Fibromyalgia   . History of DVT (deep vein thrombosis)    a. s/p IVC filter 2014  . HX: breast cancer    a. right-side; b. s/p lumpectomy & chemo w/ Docetaxol, cytoxan, adriamycin  . Kidney stones 2013  . Malnutrition ([name])    a. chronic diarrhea s/p most meals   . Moderate mitral regurgitation   . Muscle spasm   . NICM (nonischemic cardiomyopathy) ([name])    a. Lexiscan 08/2015: no sig ischemia, no ECG changes concerning for ischemia (baseline abnormal T wave along anterolateral leads), EF 61%, low risk scan  . Orthostatic hypotension   . PAF (paroxysmal atrial fibrillation) ([name])    a. not on long term, full-dose anticoagulation since 2011, had high INR at that time 2/2 possible malnutition, has been off anticoagulation since; b. CHADS2VASc at least 2 (CHF, female); c. 48 hr Holter 09/2015 NSR w/ rare PACs, no significant PVCs, periods of sinus tachycarida with average heart rate typically in the 60-100 range. No sig arrhythmia. No Afib seen.   . Peripheral neuropathy ([name])    a. secondary to chemo  . Postural hypotension   . S/P gastric bypass   . Vitamin D deficiency     HOSPITAL COURSE:   Right-sided weakness and numbness, rule out CVA. The patient will be placed  for observation. Got MRI/MRA of the brain, carotid duplex, echocardiogram, PT and OT, neuro check.  Continue aspirin and statin. She is seen by Neurologist, MRI and MRA are negative.   D/c home.  Acute renal failure due to dehydration. Start IV fluid support and follow-up BMP. Hold the spironolactone.  History of PAF. Now in sinus rhythm. Continue aspirin and home medication.  Peripheral neuropathy. Continue home medication. Malnutrition. Dietitian consult.  DISCHARGE CONDITIONS:   Stable..  CONSULTS OBTAINED:  Treatment Team:  [name], MD  DRUG ALLERGIES:   Allergies  Allergen Reactions  . Shrimp [Shellfish Allergy] Anaphylaxis    DISCHARGE MEDICATIONS:   Current Discharge Medication List    CONTINUE these medications which have NOT CHANGED   Details  anastrozole (ARIMIDEX) 1 MG tablet Take 1 mg by mouth daily.      aspirin EC 81 MG tablet Take 81 mg by mouth daily.    Cholecalciferol (VITAMIN D3) 1000 units CAPS Take 1,000 Units by mouth daily.    clonazePAM (KLONOPIN) 0.5 MG tablet Take 0.5 mg by mouth at bedtime as needed for anxiety.     flecainide (TAMBOCOR) 100 MG tablet Take 0.5 tablets (50 mg total) by mouth every 12 (twelve) hours. Qty: 60 tablet, Refills: 0    fludrocortisone (FLORINEF) 0.1 MG tablet Take 0.2 mg by mouth daily.  loperamide (IMODIUM) 2 MG capsule Take 2 mg by mouth 4 (four) times daily as needed for diarrhea or loose stools.    midodrine (PROAMATINE) 5 MG tablet Take 10 mg by mouth every morning.     mirtazapine (REMERON) 15 MG tablet Take 15 mg by mouth at bedtime.      morphine (MS CONTIN) 15 MG 12 hr tablet Take 1 tablet (15 mg total) by mouth every 8 (eight) hours. Qty: 4 tablet, Refills: 0    omeprazole (PRILOSEC) 40 MG capsule Take 40 mg by mouth daily.    ondansetron (ZOFRAN ODT) 4 MG disintegrating tablet Take 1 tablet (4 mg total) by mouth every 8 (eight) hours as needed for nausea or vomiting. Qty: 20 tablet,  Refills: 0    oxyCODONE (ROXICODONE) 15 MG immediate release tablet Take 15 mg by mouth every 6 (six) hours as needed for pain.    potassium chloride (K-DUR) 10 MEQ tablet Take 20 mEq by mouth 2 (two) times daily.    pregabalin (LYRICA) 150 MG capsule Take 150 mg by mouth 3 (three) times daily.    sertraline (ZOLOFT) 100 MG tablet Take 150 mg by mouth daily.     spironolactone (ALDACTONE) 25 MG tablet Take 25 mg by mouth daily.    vitamin C (ASCORBIC ACID) 500 MG tablet Take 500 mg by mouth daily.    ferrous sulfate 325 (65 FE) MG tablet Take 325 mg by mouth daily with breakfast. Reported on 11/06/2015    fluticasone (FLONASE) 50 MCG/ACT nasal spray Place 1 spray into the nose daily.     lidocaine (LIDODERM) 5 % Place 3 patches onto the skin daily. Remove & Discard patch within 12 hours or as directed by MD     loratadine (CLARITIN) 10 MG tablet Take 10 mg by mouth daily as needed for allergies.     tiZANidine (ZANAFLEX) 4 MG tablet Take 4 mg by mouth 3 (three) times daily.     triamcinolone cream (KENALOG) 0.1 % Apply 1 application topically 2 (two) times daily.    vitamin B-12 (CYANOCOBALAMIN) 1000 MCG tablet Take 1,000 mcg by mouth daily.          DISCHARGE INSTRUCTIONS:    Follow with PMD in 2 weeks.  If you experience worsening of your admission symptoms, develop shortness of breath, life threatening emergency, suicidal or homicidal thoughts you must seek medical attention immediately by calling 911 or calling your MD immediately  if symptoms less severe.  You Must read complete instructions/literature along with all the possible adverse reactions/side effects for all the Medicines you take and that have been prescribed to you. Take any new Medicines after you have completely understood and accept all the possible adverse reactions/side effects.   Please note  You were cared for by a hospitalist during your hospital stay. If you have any questions about your discharge  medications or the care you received while you were in the hospital after you are discharged, you can call the unit and asked to speak with the hospitalist on call if the hospitalist that took care of you is not available. Once you are discharged, your primary care physician will handle any further medical issues. Please note that NO REFILLS for any discharge medications will be authorized once you are discharged, as it is imperative that you return to your primary care physician (or establish a relationship with a primary care physician if you do not have one) for your aftercare needs so that they can  reassess your need for medications and monitor your lab values.    Today   CHIEF COMPLAINT:   Chief Complaint  Patient presents with  . Arm Pain    HISTORY OF PRESENT ILLNESS:  Lindsay [name]  is a 61 y.o. female with a known history of Anemia, C. difficile diarrhea, PAF and peripheral neuropathy. The patient presently ED with the above chief complaints. The patient is alert, awake and oriented. She said that she started to have right arm pain, weakness, numbness and tingling since this morning. She also complains of headache and dizziness, double vision probation. Dr. Doy Mince, neurologist see the patient and suggests placed for observation to rule out CVA. She was treated with aspirin. CT of the head didn't show any CVA.   VITAL SIGNS:  Blood pressure 125/88, pulse 73, temperature 98 F (36.7 C), temperature source Oral, resp. rate 20, height 5\' 6"  (1.676 m), weight 54.2 kg (119 lb 6.4 oz), SpO2 99 %.  I/O:   Intake/Output Summary (Last 24 hours) at 01/06/17 1411 Last data filed at 01/06/17 0620  Gross per 24 hour  Intake          1498.33 ml  Output                0 ml  Net          1498.33 ml    PHYSICAL EXAMINATION:  GENERAL:  61 y.o.-year-old patient lying in the bed with no acute distress. Malnourished, muscle wasting present. EYES: Pupils equal, round, reactive to light and  accommodation. No scleral icterus. Extraocular muscles intact.  HEENT: Head atraumatic, normocephalic. Oropharynx and nasopharynx clear.  NECK:  Supple, no jugular venous distention. No thyroid enlargement, no tenderness.  LUNGS: Normal breath sounds bilaterally, no wheezing, rales,rhonchi or crepitation. No use of accessory muscles of respiration.  CARDIOVASCULAR: S1, S2 normal. No murmurs, rubs, or gallops.  ABDOMEN: Soft, non-tender, non-distended. Bowel sounds present. No organomegaly or mass.  EXTREMITIES: No pedal edema, cyanosis, or clubbing.  NEUROLOGIC: Cranial nerves II through XII are intact. Muscle strength 5/5 in all extremities. Sensation intact. Gait not checked.  PSYCHIATRIC: The patient is alert and oriented x 3.  SKIN: No obvious rash, lesion, or ulcer.   DATA REVIEW:   CBC  Recent Labs Lab 01/05/17 1332  WBC 6.4  HGB 11.1*  HCT 34.2*  PLT 211    Chemistries   Recent Labs Lab 01/05/17 1332  NA 141  K 4.8  CL 111  CO2 24  GLUCOSE 78  BUN 23*  CREATININE 1.43*  CALCIUM 9.4    Cardiac Enzymes  Recent Labs Lab 01/05/17 1332  [place] <0.03    Microbiology Results  Results for orders placed or performed during the hospital encounter of 02/03/16  Rapid Influenza A&B Antigens ([place] only)     Status: None   Collection Time: 02/03/16  5:26 PM  Result Value Ref Range Status   Influenza A ([place]) NEGATIVE NEGATIVE Final   Influenza B [facility]) NEGATIVE NEGATIVE Final    RADIOLOGY:  Dg Shoulder Right  Result Date: 01/05/2017 CLINICAL DATA:  Acute onset of right shoulder pain. Initial encounter. EXAM: RIGHT SHOULDER - 2+ VIEW COMPARISON:  None. FINDINGS: There is no evidence of fracture or dislocation. The right humeral head is seated within the glenoid fossa. The acromioclavicular joint is unremarkable in appearance. No significant soft tissue abnormalities are seen. The visualized portions of the right lung are clear. Clips are noted at the right axilla.  IMPRESSION: No  evidence of fracture or dislocation. Electronically Signed   By: Garald Balding M.D.   On: 01/05/2017 23:53   Ct Head Wo Contrast  Result Date: 01/05/2017 CLINICAL DATA:  Right arm and leg weakness with paresthesia. EXAM: CT HEAD WITHOUT CONTRAST TECHNIQUE: Contiguous axial images were obtained from the base of the skull through the vertex without intravenous contrast. COMPARISON:  MRI head 08/26/2015 FINDINGS: Brain: No evidence of acute infarction, hemorrhage, hydrocephalus, extra-axial collection or mass lesion/mass effect. Vascular: No hyperdense vessel or unexpected calcification. Skull: Negative Sinuses/Orbits: Negative Other: None IMPRESSION: No acute abnormality. Electronically Signed   By: Franchot Gallo M.D.   On: 01/05/2017 13:23   Mr Brain Wo Contrast  Result Date: 01/06/2017 CLINICAL DATA:  Acute presentation with right arm and leg weakness and paresthesia. EXAM: MRI HEAD WITHOUT CONTRAST MRA HEAD WITHOUT CONTRAST TECHNIQUE: Multiplanar, multiecho pulse sequences of the brain and surrounding structures were obtained without intravenous contrast. Angiographic images of the head were obtained using MRA technique without contrast. COMPARISON:  CT 01/05/2017.  MRI 08/26/2015. FINDINGS: MRI HEAD FINDINGS Brain: Diffusion imaging does not show any acute or subacute infarction. There chronic small-vessel ischemic changes affecting the pons. No focal cerebellar insult. Cerebral hemispheres show moderate changes small vessel disease within the deep and subcortical white matter, premature for age. No cortical or large vessel territory infarction. No mass lesion, hemorrhage, hydrocephalus or extra-axial collection. Vascular: Major vessels at the base of the brain show flow. Skull and upper cervical spine: Negative Sinuses/Orbits: Clear/normal Other: None significant MRA HEAD FINDINGS Both internal carotid arteries are patent through the siphon regions. No siphon stenosis. Supraclinoid internal  carotid arteries are patent bilaterally. Anterior and middle cerebral arteries are patent bilaterally, but show narrowing and irregularity of the distal vessels consistent with intracranial small-vessel disease. Both vertebral arteries are patent with the right being dominant. No basilar stenosis. Posterior circulation branch vessels are patent. Distal PCA branches show some atherosclerotic irregularity. IMPRESSION: No acute or subacute insult. Moderate chronic small-vessel ischemic changes affecting the cerebral hemispheric white matter. MRA negative for large vessel occlusion or correctable proximal stenosis. Distal vessel intracranial atherosclerotic irregularity. Electronically Signed   By: Nelson Chimes M.D.   On: 01/06/2017 09:53   US Carotid Bilateral (at [facility] Only)  Result Date: 01/06/2017 CLINICAL DATA:  Right arm weakness. EXAM: BILATERAL CAROTID DUPLEX ULTRASOUND TECHNIQUE: Pearline Cables scale imaging, color Doppler and duplex ultrasound were performed of bilateral carotid and vertebral arteries in the neck. COMPARISON:  CT 01/05/2017.  Ultrasound 08/24/2015. FINDINGS: Criteria: Quantification of carotid stenosis is based on velocity parameters that correlate the residual internal carotid diameter with NASCET-based stenosis levels, using the diameter of the distal internal carotid lumen as the denominator for stenosis measurement. The following velocity measurements were obtained: RIGHT ICA:  87/36 cm/sec CCA:  [id number] cm/sec SYSTOLIC ICA/CCA RATIO:  1.3 DIASTOLIC ICA/CCA RATIO:  3.0 ECA:  70 cm/sec LEFT ICA:  93/39 cm/sec CCA:  [id number] cm/sec SYSTOLIC ICA/CCA RATIO:  1.1 DIASTOLIC ICA/CCA RATIO:  1.3 ECA:  81 cm/sec RIGHT CAROTID ARTERY: Mild right carotid bifurcation proximal ICA atherosclerotic vascular disease. No flow limiting stenosis. RIGHT VERTEBRAL ARTERY:  Patent with antegrade flow. LEFT CAROTID ARTERY: Mild left carotid bifurcation proximal ICA atherosclerotic vascular disease. LEFT VERTEBRAL  ARTERY:  Patent with antegrade flow. IMPRESSION: 1. Mild bilateral carotid atherosclerotic vascular disease. No flow limiting stenosis. 2.  Vertebral arteries are patent with antegrade flow . Electronically Signed   By: Marcello Moores  Register   On: 01/06/2017  09:44   Dg Humerus Right  Result Date: 01/05/2017 CLINICAL DATA:  Acute onset of right arm pain.  Initial encounter. EXAM: RIGHT HUMERUS - 2+ VIEW COMPARISON:  None. FINDINGS: There is no evidence of fracture or dislocation. The right humerus appears intact. The right humeral head remains seated at the glenoid fossa. The right acromioclavicular joint is unremarkable in appearance. The elbow joint is incompletely assessed, but appears grossly unremarkable. The visualized portions of the right lung are clear. Clips are noted overlying the right axilla. IMPRESSION: No evidence of fracture or dislocation. Electronically Signed   By: Garald Balding M.D.   On: 01/05/2017 23:54   Mr Jodene Nam Head/brain [id number] Cm  Result Date: 01/06/2017 CLINICAL DATA:  Acute presentation with right arm and leg weakness and paresthesia. EXAM: MRI HEAD WITHOUT CONTRAST MRA HEAD WITHOUT CONTRAST TECHNIQUE: Multiplanar, multiecho pulse sequences of the brain and surrounding structures were obtained without intravenous contrast. Angiographic images of the head were obtained using MRA technique without contrast. COMPARISON:  CT 01/05/2017.  MRI 08/26/2015. FINDINGS: MRI HEAD FINDINGS Brain: Diffusion imaging does not show any acute or subacute infarction. There chronic small-vessel ischemic changes affecting the pons. No focal cerebellar insult. Cerebral hemispheres show moderate changes small vessel disease within the deep and subcortical white matter, premature for age. No cortical or large vessel territory infarction. No mass lesion, hemorrhage, hydrocephalus or extra-axial collection. Vascular: Major vessels at the base of the brain show flow. Skull and upper cervical spine: Negative  Sinuses/Orbits: Clear/normal Other: None significant MRA HEAD FINDINGS Both internal carotid arteries are patent through the siphon regions. No siphon stenosis. Supraclinoid internal carotid arteries are patent bilaterally. Anterior and middle cerebral arteries are patent bilaterally, but show narrowing and irregularity of the distal vessels consistent with intracranial small-vessel disease. Both vertebral arteries are patent with the right being dominant. No basilar stenosis. Posterior circulation branch vessels are patent. Distal PCA branches show some atherosclerotic irregularity. IMPRESSION: No acute or subacute insult. Moderate chronic small-vessel ischemic changes affecting the cerebral hemispheric white matter. MRA negative for large vessel occlusion or correctable proximal stenosis. Distal vessel intracranial atherosclerotic irregularity. Electronically Signed   By: Nelson Chimes M.D.   On: 01/06/2017 09:53    EKG:   Orders placed or performed during the hospital encounter of 01/05/17  . ED EKG  . ED EKG  . ED EKG  . ED EKG  . EKG 12-Lead  . EKG 12-Lead      Management plans discussed with the patient, family and they are in agreement.  CODE STATUS:     Code Status Orders        Start     Ordered   01/05/17 1649  Full code  Continuous     01/05/17 1648    Code Status History    Date Active Date Inactive Code Status Order ID Comments User Context   05/29/2016  4:24 PM 05/30/2016  8:01 AM Full Code [id number]  Idelle Crouch, MD Inpatient   08/23/2015  4:14 PM 08/27/2015  7:54 PM Full Code [id number]  Fritzi Mandes, MD ED   05/23/2015 11:00 AM 05/26/2015  3:23 PM Full Code [id number]  Henreitta Leber, MD Inpatient      TOTAL TIME TAKING CARE OF THIS PATIENT: 35 minutes.    Vaughan Basta M.D on 01/06/2017 at 2:11 PM  Between 7am to 6pm - Pager - [phone]  After 6pm go to [email or web address] - password [email or web address]  [employer]  [phone]  CC: Primary care physician; Pcp Not In System   Note: This dictation was prepared with Dragon dictation along with smaller phrase technology. Any transcriptional errors that result from this process are unintentional.

## 2017-01-06 NOTE — Care Management Obs Status (Signed)
[place] NOTIFICATION   Patient Details  Name: Geroldine Goodno MRN: [id number] Date of Birth: 11/11/1955   Medicare Observation Status Notification Given:  Yes    Katrina Stack, RN 01/06/2017, 3:01 PM

## 2017-01-06 NOTE — Progress Notes (Signed)
Subjective: Patient reports being stronger in her RUE today but still not at baseline.    Objective: Current vital signs: BP 125/88 (BP Location: Left Arm)   Pulse 73   Temp 98 F (36.7 C) (Oral)   Resp 20   Ht 5\' 6"  (1.676 m)   Wt 54.2 kg (119 lb 6.4 oz)   SpO2 99%   BMI 19.27 kg/m  Vital signs in last 24 hours: Temp:  [97.6 F (36.4 C)-98 F (36.7 C)] 98 F (36.7 C) (03/02 1324) Pulse Rate:  [53-96] 73 (03/02 1324) Resp:  [14-20] 20 (03/02 1324) BP: (104-150)/(78-94) 125/88 (03/02 1324) SpO2:  [99 %-100 %] 99 % (03/02 1324) Weight:  [54.2 kg (119 lb 6.4 oz)] 54.2 kg (119 lb 6.4 oz) (03/01 1653)  Intake/Output from previous day: 03/01 0701 - 03/02 0700 In: 1498.3 [P.O.:240; I.V.:1258.3] Out: -  Intake/Output this shift: Total I/O In: 240 [P.O.:240] Out: -  Nutritional status: Diet Heart Room service appropriate? Yes; Fluid consistency: Thin Diet - low sodium heart healthy  Neurologic Exam: Mental Status: Alert, oriented, thought content appropriate.  Speech fluent without evidence of aphasia.  Able to follow 3 step commands without difficulty. Cranial Nerves: II: Discs flat bilaterally; Visual fields grossly normal, pupils equal, round, reactive to light and accommodation III,IV, VI: ptosis not present, extra-ocular motions intact bilaterally V,VII: Smile symmetric (patient without dentures), facial light touch sensation normal bilaterally VIII: hearing normal bilaterally IX,X: gag reflex present XI: bilateral shoulder shrug XII: midline tongue extension Motor: Right :  Upper extremity   5-/5 with wrist drop improved                                            Left:     Upper extremity   5/5             Lower extremity   3-/5                                                                                                Lower extremity   3-/5   Lab Results: Basic Metabolic Panel:  Recent Labs Lab 01/05/17 1332  NA 141  K 4.8  CL 111  CO2 24  GLUCOSE 78   BUN 23*  CREATININE 1.43*  CALCIUM 9.4    Liver Function Tests: No results for input(s): AST, ALT, ALKPHOS, BILITOT, PROT, ALBUMIN in the last 168 hours. No results for input(s): LIPASE, AMYLASE in the last 168 hours. No results for input(s): AMMONIA in the last 168 hours.  CBC:  Recent Labs Lab 01/05/17 1332  WBC 6.4  HGB 11.1*  HCT 34.2*  MCV 88.4  PLT 211    Cardiac Enzymes:  Recent Labs Lab 01/05/17 1332  CKTOTAL 110  TROPONINI <0.03    Lipid Panel:  Recent Labs Lab 01/06/17 0632  CHOL 168  TRIG 75  HDL 69  CHOLHDL 2.4  VLDL 15  LDLCALC 84    CBG: No results for input(s): GLUCAP  in the last 168 hours.  Microbiology: Results for orders placed or performed during the hospital encounter of 02/03/16  Rapid Influenza A&B Antigens [facility] only)     Status: None   Collection Time: 02/03/16  5:26 PM  Result Value Ref Range Status   Influenza A ([place]) NEGATIVE NEGATIVE Final   Influenza B [facility]) NEGATIVE NEGATIVE Final    Coagulation Studies:  Recent Labs  01/05/17 1332  LABPROT 13.3  INR 1.01    Imaging: Dg Shoulder Right  Result Date: 01/05/2017 CLINICAL DATA:  Acute onset of right shoulder pain. Initial encounter. EXAM: RIGHT SHOULDER - 2+ VIEW COMPARISON:  None. FINDINGS: There is no evidence of fracture or dislocation. The right humeral head is seated within the glenoid fossa. The acromioclavicular joint is unremarkable in appearance. No significant soft tissue abnormalities are seen. The visualized portions of the right lung are clear. Clips are noted at the right axilla. IMPRESSION: No evidence of fracture or dislocation. Electronically Signed   By: Garald Balding M.D.   On: 01/05/2017 23:53   Ct Head Wo Contrast  Result Date: 01/05/2017 CLINICAL DATA:  Right arm and leg weakness with paresthesia. EXAM: CT HEAD WITHOUT CONTRAST TECHNIQUE: Contiguous axial images were obtained from the base of the skull through the vertex without intravenous  contrast. COMPARISON:  MRI head 08/26/2015 FINDINGS: Brain: No evidence of acute infarction, hemorrhage, hydrocephalus, extra-axial collection or mass lesion/mass effect. Vascular: No hyperdense vessel or unexpected calcification. Skull: Negative Sinuses/Orbits: Negative Other: None IMPRESSION: No acute abnormality. Electronically Signed   By: Franchot Gallo M.D.   On: 01/05/2017 13:23   Mr Brain Wo Contrast  Result Date: 01/06/2017 CLINICAL DATA:  Acute presentation with right arm and leg weakness and paresthesia. EXAM: MRI HEAD WITHOUT CONTRAST MRA HEAD WITHOUT CONTRAST TECHNIQUE: Multiplanar, multiecho pulse sequences of the brain and surrounding structures were obtained without intravenous contrast. Angiographic images of the head were obtained using MRA technique without contrast. COMPARISON:  CT 01/05/2017.  MRI 08/26/2015. FINDINGS: MRI HEAD FINDINGS Brain: Diffusion imaging does not show any acute or subacute infarction. There chronic small-vessel ischemic changes affecting the pons. No focal cerebellar insult. Cerebral hemispheres show moderate changes small vessel disease within the deep and subcortical white matter, premature for age. No cortical or large vessel territory infarction. No mass lesion, hemorrhage, hydrocephalus or extra-axial collection. Vascular: Major vessels at the base of the brain show flow. Skull and upper cervical spine: Negative Sinuses/Orbits: Clear/normal Other: None significant MRA HEAD FINDINGS Both internal carotid arteries are patent through the siphon regions. No siphon stenosis. Supraclinoid internal carotid arteries are patent bilaterally. Anterior and middle cerebral arteries are patent bilaterally, but show narrowing and irregularity of the distal vessels consistent with intracranial small-vessel disease. Both vertebral arteries are patent with the right being dominant. No basilar stenosis. Posterior circulation branch vessels are patent. Distal PCA branches show some  atherosclerotic irregularity. IMPRESSION: No acute or subacute insult. Moderate chronic small-vessel ischemic changes affecting the cerebral hemispheric white matter. MRA negative for large vessel occlusion or correctable proximal stenosis. Distal vessel intracranial atherosclerotic irregularity. Electronically Signed   By: Nelson Chimes M.D.   On: 01/06/2017 09:53   US Carotid Bilateral (at [facility] Only)  Result Date: 01/06/2017 CLINICAL DATA:  Right arm weakness. EXAM: BILATERAL CAROTID DUPLEX ULTRASOUND TECHNIQUE: Pearline Cables scale imaging, color Doppler and duplex ultrasound were performed of bilateral carotid and vertebral arteries in the neck. COMPARISON:  CT 01/05/2017.  Ultrasound 08/24/2015. FINDINGS: Criteria: Quantification of carotid stenosis  is based on velocity parameters that correlate the residual internal carotid diameter with NASCET-based stenosis levels, using the diameter of the distal internal carotid lumen as the denominator for stenosis measurement. The following velocity measurements were obtained: RIGHT ICA:  87/36 cm/sec CCA:  [id number] cm/sec SYSTOLIC ICA/CCA RATIO:  1.3 DIASTOLIC ICA/CCA RATIO:  3.0 ECA:  70 cm/sec LEFT ICA:  93/39 cm/sec CCA:  [id number] cm/sec SYSTOLIC ICA/CCA RATIO:  1.1 DIASTOLIC ICA/CCA RATIO:  1.3 ECA:  81 cm/sec RIGHT CAROTID ARTERY: Mild right carotid bifurcation proximal ICA atherosclerotic vascular disease. No flow limiting stenosis. RIGHT VERTEBRAL ARTERY:  Patent with antegrade flow. LEFT CAROTID ARTERY: Mild left carotid bifurcation proximal ICA atherosclerotic vascular disease. LEFT VERTEBRAL ARTERY:  Patent with antegrade flow. IMPRESSION: 1. Mild bilateral carotid atherosclerotic vascular disease. No flow limiting stenosis. 2.  Vertebral arteries are patent with antegrade flow . Electronically Signed   By: Marcello Moores  Register   On: 01/06/2017 09:44   Dg Humerus Right  Result Date: 01/05/2017 CLINICAL DATA:  Acute onset of right arm pain.  Initial encounter. EXAM:  RIGHT HUMERUS - 2+ VIEW COMPARISON:  None. FINDINGS: There is no evidence of fracture or dislocation. The right humerus appears intact. The right humeral head remains seated at the glenoid fossa. The right acromioclavicular joint is unremarkable in appearance. The elbow joint is incompletely assessed, but appears grossly unremarkable. The visualized portions of the right lung are clear. Clips are noted overlying the right axilla. IMPRESSION: No evidence of fracture or dislocation. Electronically Signed   By: Garald Balding M.D.   On: 01/05/2017 23:54   Mr Jodene Nam Head/brain [id number] Cm  Result Date: 01/06/2017 CLINICAL DATA:  Acute presentation with right arm and leg weakness and paresthesia. EXAM: MRI HEAD WITHOUT CONTRAST MRA HEAD WITHOUT CONTRAST TECHNIQUE: Multiplanar, multiecho pulse sequences of the brain and surrounding structures were obtained without intravenous contrast. Angiographic images of the head were obtained using MRA technique without contrast. COMPARISON:  CT 01/05/2017.  MRI 08/26/2015. FINDINGS: MRI HEAD FINDINGS Brain: Diffusion imaging does not show any acute or subacute infarction. There chronic small-vessel ischemic changes affecting the pons. No focal cerebellar insult. Cerebral hemispheres show moderate changes small vessel disease within the deep and subcortical white matter, premature for age. No cortical or large vessel territory infarction. No mass lesion, hemorrhage, hydrocephalus or extra-axial collection. Vascular: Major vessels at the base of the brain show flow. Skull and upper cervical spine: Negative Sinuses/Orbits: Clear/normal Other: None significant MRA HEAD FINDINGS Both internal carotid arteries are patent through the siphon regions. No siphon stenosis. Supraclinoid internal carotid arteries are patent bilaterally. Anterior and middle cerebral arteries are patent bilaterally, but show narrowing and irregularity of the distal vessels consistent with intracranial small-vessel  disease. Both vertebral arteries are patent with the right being dominant. No basilar stenosis. Posterior circulation branch vessels are patent. Distal PCA branches show some atherosclerotic irregularity. IMPRESSION: No acute or subacute insult. Moderate chronic small-vessel ischemic changes affecting the cerebral hemispheric white matter. MRA negative for large vessel occlusion or correctable proximal stenosis. Distal vessel intracranial atherosclerotic irregularity. Electronically Signed   By: Nelson Chimes M.D.   On: 01/06/2017 09:53    Medications:  I have reviewed the patient's current medications. Scheduled: .  stroke: mapping our early stages of recovery book   Does not apply Once  . anastrozole  1 mg Oral Daily  . aspirin  300 mg Rectal Daily   Or  . aspirin  325 mg Oral Daily  . cholecalciferol  1,000 Units Oral Daily  . clonazePAM  0.5 mg Oral QHS  . ferrous sulfate  325 mg Oral Q breakfast  . flecainide  50 mg Oral Q12H  . fludrocortisone  0.2 mg Oral Daily  . fluticasone  1 spray Each Nare Daily  . heparin  5,000 Units Subcutaneous Q8H  . mirtazapine  15 mg Oral QHS  . morphine  15 mg Oral Q8H  . pantoprazole  40 mg Oral Daily  . potassium chloride  20 mEq Oral BID  . pregabalin  150 mg Oral TID  . sertraline  50 mg Oral Daily  . tiZANidine  4 mg Oral TID  . vitamin B-12  1,000 mcg Oral Daily  . vitamin C  500 mg Oral Daily    Assessment/Plan: Patient significantly improved but continues to have some wrist drop.  Grip improved.  MRI of the brain reviewed and shows no acute changes.  Presenting symptoms likely secondary to her peripheral neuropathy.  Carotid dopplers show no evidence of hemodynamically significant stenosis.  LDL 84.  Recommendations: 1.  Continued therapy on an outpatient basis.  No further neurological work up recommended at this time.     LOS: 0 days   Alexis Goodell, MD Neurology [phone] 01/06/2017  2:24 PM

## 2017-01-06 NOTE — Progress Notes (Signed)
Patient discharged via wheelchair and taxi. IV removed and catheter intact. All discharge instructions given and patient verbalizes understanding. Tele removed and returned.Prescriptions given to patient No distress noted.

## 2017-01-06 NOTE — Progress Notes (Signed)
Chaplain visited patient who was with her relative in the room. Patient seemed to be sedated because the way she was communicating. The relative who was in the room said that patient took some medications that was affecting her thinking and ability to communicate.

## 2017-01-08 LAB — HEMOGLOBIN A1C
Hgb A1c MFr Bld: 4.8 % (ref 4.8–5.6)
MEAN PLASMA GLUCOSE: 91 mg/dL

## 2017-01-17 ENCOUNTER — Other Ambulatory Visit: Payer: Self-pay | Admitting: Family Medicine

## 2017-01-17 DIAGNOSIS — M25451 Effusion, right hip: Secondary | ICD-10-CM

## 2017-01-17 DIAGNOSIS — R748 Abnormal levels of other serum enzymes: Secondary | ICD-10-CM

## 2017-01-17 DIAGNOSIS — M79601 Pain in right arm: Secondary | ICD-10-CM

## 2017-01-17 DIAGNOSIS — R29898 Other symptoms and signs involving the musculoskeletal system: Secondary | ICD-10-CM

## 2017-01-17 DIAGNOSIS — W888XXA Exposure to other ionizing radiation, initial encounter: Secondary | ICD-10-CM

## 2017-01-17 DIAGNOSIS — R9082 White matter disease, unspecified: Secondary | ICD-10-CM

## 2017-01-17 DIAGNOSIS — G629 Polyneuropathy, unspecified: Secondary | ICD-10-CM

## 2017-01-17 DIAGNOSIS — T451X5A Adverse effect of antineoplastic and immunosuppressive drugs, initial encounter: Secondary | ICD-10-CM

## 2017-01-17 DIAGNOSIS — M25551 Pain in right hip: Secondary | ICD-10-CM

## 2017-01-17 DIAGNOSIS — Z853 Personal history of malignant neoplasm of breast: Secondary | ICD-10-CM

## 2017-01-17 DIAGNOSIS — G540 Brachial plexus disorders: Secondary | ICD-10-CM

## 2017-01-17 DIAGNOSIS — G620 Drug-induced polyneuropathy: Secondary | ICD-10-CM

## 2017-01-31 ENCOUNTER — Ambulatory Visit: Payer: Medicare Other

## 2017-02-07 ENCOUNTER — Ambulatory Visit: Admission: RE | Admit: 2017-02-07 | Payer: Medicare Other | Source: Ambulatory Visit

## 2017-03-22 ENCOUNTER — Emergency Department: Payer: Medicare Other

## 2017-03-22 ENCOUNTER — Encounter: Payer: Self-pay | Admitting: Emergency Medicine

## 2017-03-22 ENCOUNTER — Inpatient Hospital Stay
Admission: EM | Admit: 2017-03-22 | Discharge: 2017-04-02 | DRG: 312 | Disposition: A | Discharge status: Other Acute Inpatient Hospital | Payer: Medicare Other | Attending: Internal Medicine | Admitting: Internal Medicine

## 2017-03-22 DIAGNOSIS — Z87442 Personal history of urinary calculi: Secondary | ICD-10-CM

## 2017-03-22 DIAGNOSIS — G629 Polyneuropathy, unspecified: Secondary | ICD-10-CM | POA: Diagnosis present

## 2017-03-22 DIAGNOSIS — Z8249 Family history of ischemic heart disease and other diseases of the circulatory system: Secondary | ICD-10-CM

## 2017-03-22 DIAGNOSIS — D638 Anemia in other chronic diseases classified elsewhere: Secondary | ICD-10-CM | POA: Diagnosis present

## 2017-03-22 DIAGNOSIS — D649 Anemia, unspecified: Secondary | ICD-10-CM | POA: Diagnosis present

## 2017-03-22 DIAGNOSIS — M2540 Effusion, unspecified joint: Secondary | ICD-10-CM | POA: Diagnosis present

## 2017-03-22 DIAGNOSIS — Z7189 Other specified counseling: Secondary | ICD-10-CM

## 2017-03-22 DIAGNOSIS — G894 Chronic pain syndrome: Secondary | ICD-10-CM | POA: Diagnosis present

## 2017-03-22 DIAGNOSIS — M25559 Pain in unspecified hip: Secondary | ICD-10-CM

## 2017-03-22 DIAGNOSIS — K529 Noninfective gastroenteritis and colitis, unspecified: Secondary | ICD-10-CM | POA: Diagnosis present

## 2017-03-22 DIAGNOSIS — I5022 Chronic systolic (congestive) heart failure: Secondary | ICD-10-CM | POA: Diagnosis present

## 2017-03-22 DIAGNOSIS — R55 Syncope and collapse: Secondary | ICD-10-CM | POA: Diagnosis present

## 2017-03-22 DIAGNOSIS — F338 Other recurrent depressive disorders: Secondary | ICD-10-CM | POA: Diagnosis present

## 2017-03-22 DIAGNOSIS — M879 Osteonecrosis, unspecified: Secondary | ICD-10-CM | POA: Diagnosis present

## 2017-03-22 DIAGNOSIS — Z79899 Other long term (current) drug therapy: Secondary | ICD-10-CM

## 2017-03-22 DIAGNOSIS — I951 Orthostatic hypotension: Secondary | ICD-10-CM | POA: Diagnosis not present

## 2017-03-22 DIAGNOSIS — M62838 Other muscle spasm: Secondary | ICD-10-CM | POA: Diagnosis present

## 2017-03-22 DIAGNOSIS — I429 Cardiomyopathy, unspecified: Secondary | ICD-10-CM | POA: Diagnosis present

## 2017-03-22 DIAGNOSIS — K648 Other hemorrhoids: Secondary | ICD-10-CM

## 2017-03-22 DIAGNOSIS — F332 Major depressive disorder, recurrent severe without psychotic features: Secondary | ICD-10-CM

## 2017-03-22 DIAGNOSIS — R52 Pain, unspecified: Secondary | ICD-10-CM

## 2017-03-22 DIAGNOSIS — Z87891 Personal history of nicotine dependence: Secondary | ICD-10-CM

## 2017-03-22 DIAGNOSIS — I959 Hypotension, unspecified: Secondary | ICD-10-CM | POA: Diagnosis present

## 2017-03-22 DIAGNOSIS — Z91013 Allergy to seafood: Secondary | ICD-10-CM

## 2017-03-22 DIAGNOSIS — Z853 Personal history of malignant neoplasm of breast: Secondary | ICD-10-CM

## 2017-03-22 DIAGNOSIS — Z9221 Personal history of antineoplastic chemotherapy: Secondary | ICD-10-CM

## 2017-03-22 DIAGNOSIS — E43 Unspecified severe protein-calorie malnutrition: Secondary | ICD-10-CM | POA: Diagnosis present

## 2017-03-22 DIAGNOSIS — Z515 Encounter for palliative care: Secondary | ICD-10-CM

## 2017-03-22 DIAGNOSIS — I480 Paroxysmal atrial fibrillation: Secondary | ICD-10-CM | POA: Diagnosis present

## 2017-03-22 DIAGNOSIS — M8700 Idiopathic aseptic necrosis of unspecified bone: Secondary | ICD-10-CM

## 2017-03-22 DIAGNOSIS — Z9884 Bariatric surgery status: Secondary | ICD-10-CM

## 2017-03-22 DIAGNOSIS — Z86718 Personal history of other venous thrombosis and embolism: Secondary | ICD-10-CM

## 2017-03-22 DIAGNOSIS — F419 Anxiety disorder, unspecified: Secondary | ICD-10-CM | POA: Diagnosis present

## 2017-03-22 DIAGNOSIS — K623 Rectal prolapse: Secondary | ICD-10-CM | POA: Diagnosis not present

## 2017-03-22 DIAGNOSIS — R627 Adult failure to thrive: Secondary | ICD-10-CM

## 2017-03-22 DIAGNOSIS — Z7982 Long term (current) use of aspirin: Secondary | ICD-10-CM

## 2017-03-22 DIAGNOSIS — E860 Dehydration: Secondary | ICD-10-CM | POA: Diagnosis present

## 2017-03-22 DIAGNOSIS — Z79811 Long term (current) use of aromatase inhibitors: Secondary | ICD-10-CM

## 2017-03-22 DIAGNOSIS — M21331 Wrist drop, right wrist: Secondary | ICD-10-CM | POA: Diagnosis present

## 2017-03-22 DIAGNOSIS — E278 Other specified disorders of adrenal gland: Secondary | ICD-10-CM

## 2017-03-22 DIAGNOSIS — W050XXA Fall from non-moving wheelchair, initial encounter: Secondary | ICD-10-CM | POA: Diagnosis present

## 2017-03-22 DIAGNOSIS — E46 Unspecified protein-calorie malnutrition: Secondary | ICD-10-CM | POA: Diagnosis present

## 2017-03-22 DIAGNOSIS — R001 Bradycardia, unspecified: Secondary | ICD-10-CM | POA: Diagnosis not present

## 2017-03-22 DIAGNOSIS — M797 Fibromyalgia: Secondary | ICD-10-CM | POA: Diagnosis present

## 2017-03-22 DIAGNOSIS — I428 Other cardiomyopathies: Secondary | ICD-10-CM

## 2017-03-22 DIAGNOSIS — S72009A Fracture of unspecified part of neck of unspecified femur, initial encounter for closed fracture: Secondary | ICD-10-CM

## 2017-03-22 LAB — CBC WITH DIFFERENTIAL/PLATELET
BASOS ABS: 0 10*3/uL (ref 0–0.1)
BASOS PCT: 0 %
EOS ABS: 0 10*3/uL (ref 0–0.7)
Eosinophils Relative: 0 %
HEMATOCRIT: 21.4 % — AB (ref 35.0–47.0)
Hemoglobin: 7 g/dL — ABNORMAL LOW (ref 12.0–16.0)
LYMPHS ABS: 1.3 10*3/uL (ref 1.0–3.6)
LYMPHS PCT: 16 %
MCH: 32.9 pg (ref 26.0–34.0)
MCHC: 32.9 g/dL (ref 32.0–36.0)
MCV: 100 fL (ref 80.0–100.0)
MONO ABS: 0.5 10*3/uL (ref 0.2–0.9)
Monocytes Relative: 7 %
NEUTROS ABS: 6.3 10*3/uL (ref 1.4–6.5)
Neutrophils Relative %: 77 %
PLATELETS: 162 10*3/uL (ref 150–440)
RBC: 2.14 MIL/uL — ABNORMAL LOW (ref 3.80–5.20)
RDW: 16.9 % — AB (ref 11.5–14.5)
WBC: 8.1 10*3/uL (ref 3.6–11.0)

## 2017-03-22 LAB — COMPREHENSIVE METABOLIC PANEL
ALBUMIN: 2.1 g/dL — AB (ref 3.5–5.0)
ALT: 16 U/L (ref 14–54)
ANION GAP: 7 (ref 5–15)
AST: 27 U/L (ref 15–41)
Alkaline Phosphatase: 114 U/L (ref 38–126)
BILIRUBIN TOTAL: 0.8 mg/dL (ref 0.3–1.2)
BUN: 31 mg/dL — ABNORMAL HIGH (ref 6–20)
CHLORIDE: 115 mmol/L — AB (ref 101–111)
CO2: 23 mmol/L (ref 22–32)
Calcium: 8.1 mg/dL — ABNORMAL LOW (ref 8.9–10.3)
Creatinine, Ser: 1.25 mg/dL — ABNORMAL HIGH (ref 0.44–1.00)
GFR calc Af Amer: 54 mL/min — ABNORMAL LOW (ref >60)
GFR calc non Af Amer: 46 mL/min — ABNORMAL LOW (ref >60)
GLUCOSE: 78 mg/dL (ref 65–99)
POTASSIUM: 3.5 mmol/L (ref 3.5–5.1)
Sodium: 145 mmol/L (ref 135–145)
Total Protein: 4.7 g/dL — ABNORMAL LOW (ref 6.5–8.1)

## 2017-03-22 LAB — CK: CK TOTAL: 80 U/L (ref 38–234)

## 2017-03-22 LAB — PREPARE RBC (CROSSMATCH)

## 2017-03-22 LAB — TROPONIN I

## 2017-03-22 LAB — ABO/RH: ABO/RH(D): O POS

## 2017-03-22 MED ORDER — MORPHINE SULFATE (PF) 4 MG/ML IV SOLN
4.0000 mg | Freq: Once | INTRAVENOUS | Status: AC
  Administered 2017-03-22: 4 mg via INTRAVENOUS
  Filled 2017-03-22: qty 1

## 2017-03-22 MED ORDER — ACETAMINOPHEN 650 MG RE SUPP: 650.0000 mg | Freq: Four times a day (QID) | RECTAL | Status: DC | PRN

## 2017-03-22 MED ORDER — DOCUSATE SODIUM 100 MG PO CAPS
100.0000 mg | ORAL_CAPSULE | Freq: Two times a day (BID) | ORAL | Status: DC
  Administered 2017-03-22 – 2017-03-26 (×8): 100 mg via ORAL
  Filled 2017-03-22 (×10): qty 1

## 2017-03-22 MED ORDER — MIDODRINE HCL 5 MG PO TABS
5.0000 mg | ORAL_TABLET | Freq: Every evening | ORAL | Status: DC
  Administered 2017-03-22: 5 mg via ORAL
  Filled 2017-03-22: qty 1

## 2017-03-22 MED ORDER — BISACODYL 5 MG PO TBEC
5.0000 mg | DELAYED_RELEASE_TABLET | Freq: Every day | ORAL | Status: DC | PRN
  Administered 2017-03-28: 5 mg via ORAL
  Filled 2017-03-22: qty 1

## 2017-03-22 MED ORDER — TRAZODONE HCL 50 MG PO TABS: 25.0000 mg | ORAL_TABLET | Freq: Every evening | ORAL | Status: DC | PRN

## 2017-03-22 MED ORDER — MORPHINE SULFATE ER 15 MG PO TBCR
15.0000 mg | EXTENDED_RELEASE_TABLET | Freq: Three times a day (TID) | ORAL | Status: DC
  Administered 2017-03-22 – 2017-03-23 (×4): 15 mg via ORAL
  Filled 2017-03-22 (×3): qty 1

## 2017-03-22 MED ORDER — CLONAZEPAM 0.5 MG PO TABS
0.5000 mg | ORAL_TABLET | Freq: Every evening | ORAL | Status: DC | PRN
  Administered 2017-03-30 – 2017-04-01 (×2): 0.5 mg via ORAL
  Filled 2017-03-22 (×2): qty 1

## 2017-03-22 MED ORDER — ACETAMINOPHEN 325 MG PO TABS
650.0000 mg | ORAL_TABLET | Freq: Four times a day (QID) | ORAL | Status: DC | PRN
  Administered 2017-03-23 – 2017-04-01 (×3): 650 mg via ORAL
  Filled 2017-03-22 (×3): qty 2

## 2017-03-22 MED ORDER — PANTOPRAZOLE SODIUM 40 MG PO TBEC
40.0000 mg | DELAYED_RELEASE_TABLET | Freq: Every day | ORAL | Status: DC
  Administered 2017-03-22 – 2017-03-26 (×5): 40 mg via ORAL
  Filled 2017-03-22 (×6): qty 1

## 2017-03-22 MED ORDER — TIZANIDINE HCL 4 MG PO TABS
4.0000 mg | ORAL_TABLET | Freq: Three times a day (TID) | ORAL | Status: DC
  Administered 2017-03-22 – 2017-03-23 (×2): 4 mg via ORAL
  Filled 2017-03-22 (×2): qty 1

## 2017-03-22 MED ORDER — FLUTICASONE PROPIONATE 50 MCG/ACT NA SUSP
1.0000 | Freq: Every day | NASAL | Status: DC
  Administered 2017-03-24 – 2017-04-02 (×9): 1 via NASAL
  Filled 2017-03-22: qty 16

## 2017-03-22 MED ORDER — ANASTROZOLE 1 MG PO TABS
1.0000 mg | ORAL_TABLET | Freq: Every day | ORAL | Status: DC
  Administered 2017-03-23 – 2017-04-02 (×10): 1 mg via ORAL
  Filled 2017-03-22 (×11): qty 1

## 2017-03-22 MED ORDER — POTASSIUM CHLORIDE 20 MEQ PO PACK
20.0000 meq | PACK | Freq: Two times a day (BID) | ORAL | Status: DC
  Administered 2017-03-22 – 2017-03-23 (×3): 20 meq via ORAL
  Filled 2017-03-22 (×4): qty 1

## 2017-03-22 MED ORDER — MIDODRINE HCL 5 MG PO TABS: 5.0000 mg | ORAL_TABLET | Freq: Three times a day (TID) | ORAL | Status: DC

## 2017-03-22 MED ORDER — SERTRALINE HCL 50 MG PO TABS
150.0000 mg | ORAL_TABLET | Freq: Every day | ORAL | Status: DC
  Administered 2017-03-22 – 2017-04-01 (×11): 150 mg via ORAL
  Filled 2017-03-22 (×12): qty 1

## 2017-03-22 MED ORDER — MORPHINE SULFATE ER 15 MG PO TBCR
EXTENDED_RELEASE_TABLET | ORAL | Status: AC
  Administered 2017-03-22: 15 mg via ORAL
  Filled 2017-03-22: qty 1

## 2017-03-22 MED ORDER — HEPARIN SODIUM (PORCINE) 5000 UNIT/ML IJ SOLN
5000.0000 [IU] | Freq: Three times a day (TID) | INTRAMUSCULAR | Status: DC
  Administered 2017-03-22 – 2017-03-23 (×2): 5000 [IU] via SUBCUTANEOUS
  Filled 2017-03-22 (×2): qty 1

## 2017-03-22 MED ORDER — ONDANSETRON HCL 4 MG PO TABS
4.0000 mg | ORAL_TABLET | Freq: Four times a day (QID) | ORAL | Status: DC | PRN
Start: 2017-03-22 — End: 2017-04-02

## 2017-03-22 MED ORDER — LORATADINE 10 MG PO TABS: 10.0000 mg | ORAL_TABLET | Freq: Every day | ORAL | Status: DC | PRN

## 2017-03-22 MED ORDER — SODIUM CHLORIDE 0.9 % IV SOLN
INTRAVENOUS | Status: DC
  Administered 2017-03-22: 22:00:00 via INTRAVENOUS

## 2017-03-22 MED ORDER — VITAMIN B-12 1000 MCG PO TABS
1000.0000 ug | ORAL_TABLET | Freq: Every day | ORAL | Status: DC
  Administered 2017-03-23 – 2017-03-26 (×4): 1000 ug via ORAL
  Filled 2017-03-22 (×5): qty 1

## 2017-03-22 MED ORDER — OXYCODONE HCL 5 MG PO TABS
15.0000 mg | ORAL_TABLET | Freq: Four times a day (QID) | ORAL | Status: DC | PRN
  Administered 2017-03-23 – 2017-04-02 (×25): 15 mg via ORAL
  Filled 2017-03-22 (×26): qty 3

## 2017-03-22 MED ORDER — VITAMIN D 1000 UNITS PO TABS
1000.0000 [IU] | ORAL_TABLET | Freq: Every day | ORAL | Status: DC
  Administered 2017-03-23 – 2017-03-26 (×4): 1000 [IU] via ORAL
  Filled 2017-03-22 (×5): qty 1

## 2017-03-22 MED ORDER — SODIUM CHLORIDE 0.9 % IV SOLN: Freq: Once | INTRAVENOUS | Status: DC

## 2017-03-22 MED ORDER — FLUDROCORTISONE ACETATE 0.1 MG PO TABS
0.2000 mg | ORAL_TABLET | Freq: Every day | ORAL | Status: DC
  Administered 2017-03-23 – 2017-03-24 (×2): 0.2 mg via ORAL
  Filled 2017-03-22 (×2): qty 2

## 2017-03-22 MED ORDER — FLECAINIDE ACETATE 50 MG PO TABS
50.0000 mg | ORAL_TABLET | Freq: Two times a day (BID) | ORAL | Status: DC
  Administered 2017-03-22 – 2017-04-01 (×18): 50 mg via ORAL
  Filled 2017-03-22 (×25): qty 1

## 2017-03-22 MED ORDER — LOPERAMIDE HCL 2 MG PO CAPS: 2.0000 mg | ORAL_CAPSULE | Freq: Four times a day (QID) | ORAL | Status: DC | PRN

## 2017-03-22 MED ORDER — SPIRONOLACTONE 25 MG PO TABS
25.0000 mg | ORAL_TABLET | Freq: Every day | ORAL | Status: DC
  Administered 2017-03-23: 25 mg via ORAL
  Filled 2017-03-22: qty 1

## 2017-03-22 MED ORDER — MIDODRINE HCL 5 MG PO TABS
10.0000 mg | ORAL_TABLET | Freq: Every day | ORAL | Status: DC
  Administered 2017-03-23: 10 mg via ORAL
  Filled 2017-03-22: qty 2

## 2017-03-22 MED ORDER — VITAMIN C 500 MG PO TABS
500.0000 mg | ORAL_TABLET | Freq: Every day | ORAL | Status: DC
  Administered 2017-03-23 – 2017-03-26 (×4): 500 mg via ORAL
  Filled 2017-03-22 (×5): qty 1

## 2017-03-22 MED ORDER — MIRTAZAPINE 15 MG PO TABS
15.0000 mg | ORAL_TABLET | Freq: Every day | ORAL | Status: DC
  Administered 2017-03-22 – 2017-03-26 (×5): 15 mg via ORAL
  Filled 2017-03-22 (×5): qty 1

## 2017-03-22 MED ORDER — ONDANSETRON HCL 4 MG/2ML IJ SOLN
4.0000 mg | Freq: Four times a day (QID) | INTRAMUSCULAR | Status: DC | PRN
  Administered 2017-03-25 – 2017-03-30 (×4): 4 mg via INTRAVENOUS
  Filled 2017-03-22 (×5): qty 2

## 2017-03-22 MED ORDER — ASPIRIN EC 81 MG PO TBEC
81.0000 mg | DELAYED_RELEASE_TABLET | Freq: Every day | ORAL | Status: DC
  Administered 2017-03-22 – 2017-03-26 (×5): 81 mg via ORAL
  Filled 2017-03-22 (×6): qty 1

## 2017-03-22 MED ORDER — MIDODRINE HCL 5 MG PO TABS
5.0000 mg | ORAL_TABLET | Freq: Every day | ORAL | Status: DC
  Administered 2017-03-23: 5 mg via ORAL
  Filled 2017-03-22: qty 1

## 2017-03-22 MED ORDER — PREGABALIN 50 MG PO CAPS
150.0000 mg | ORAL_CAPSULE | Freq: Three times a day (TID) | ORAL | Status: DC
  Administered 2017-03-22 – 2017-03-26 (×13): 150 mg via ORAL
  Filled 2017-03-22 (×14): qty 3

## 2017-03-22 NOTE — H&P (Signed)
[facility] [place] NAME: Lindsay [name]    MR#:  [id number]  DATE OF BIRTH:  November 30, 1955  DATE OF ADMISSION:  03/22/2017  PRIMARY CARE PHYSICIAN: System, Pcp Not In   REQUESTING/REFERRING PHYSICIAN: Dr. Mable Paris  CHIEF COMPLAINT: fall   Chief Complaint  Patient presents with  . Fall    HISTORY OF PRESENT ILLNESS:  Lindsay [name]  is a 61 y.o. female with a known history ofPossible medical problems of chronic atrial fibrillation, fibromyalgia, chronic pain syndrome had a syncopal episode in the bathroom today.she called 911 and EMS  Brought her.. She requested to go to [facility] ,Patient has been accepted at [facility], but they don't have beds for next 24 hours. She is agreeable to stay here. She complains of weakness for the past 2 weeks getting progressive worse with recurrent  falls. No chest pain or shortness patient but continued to request more pain meds. Patient fell out of wheelchair today., She states that she hit her forehead and face on the floor. Patient has no use of her right arm from before. Patient has history of avascular necrosis of the  Right hip and the patient is followed by the [facility] and is in the process of getting operated for that. Because of multiple medical problems patient will be transferred to [facility] when she has a bed. Meanwhile she'll be here for syncope workup, [facility]  recommended orthopedic consult.  PAST MEDICAL HISTORY:   Past Medical History:  Diagnosis Date  . Anemia of chronic disease   . C. difficile diarrhea 10/2011  . Cancer ([place])   . Chronic cough   . Chronic systolic CHF (congestive heart failure) ([place])    a. echo 05/2015: EF 45-50%, nl wall motion, mod MR, nl PASP  . Depression   . Depression   . Fibromyalgia   . History of DVT (deep vein thrombosis)    a. s/p IVC filter 2014  . HX: breast cancer    a. right-side; b. s/p lumpectomy & chemo w/ Docetaxol, cytoxan, adriamycin  . Kidney stones  2013  . Malnutrition ([place])    a. chronic diarrhea s/p most meals   . Moderate mitral regurgitation   . Muscle spasm   . NICM (nonischemic cardiomyopathy) ([place])    a. Lexiscan 08/2015: no sig ischemia, no ECG changes concerning for ischemia (baseline abnormal T wave along anterolateral leads), EF 61%, low risk scan  . Orthostatic hypotension   . PAF (paroxysmal atrial fibrillation) ([place])    a. not on long term, full-dose anticoagulation since 2011, had high INR at that time 2/2 possible malnutition, has been off anticoagulation since; b. CHADS2VASc at least 2 (CHF, female); c. 48 hr Holter 09/2015 NSR w/ rare PACs, no significant PVCs, periods of sinus tachycarida with average heart rate typically in the 60-100 range. No sig arrhythmia. No Afib seen.   . 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 Postural hypotension   . S/P gastric bypass   . Vitamin D deficiency     PAST SURGICAL HISTOIRY:   Past Surgical History:  Procedure Laterality Date  . ABDOMINAL HYSTERECTOMY    . BREAST LUMPECTOMY     right @ [facility]  . ESOPHAGOGASTRODUODENOSCOPY (EGD) WITH PROPOFOL N/A 05/25/2015   Procedure: ESOPHAGOGASTRODUODENOSCOPY (EGD) WITH PROPOFOL;  Surgeon: Manya Silvas, MD;  Location: [facility] ENDOSCOPY;  Service: Endoscopy;  Laterality: N/A;  . LITHOTRIPSY  jan 2013    SOCIAL HISTORY:  Social History  Substance Use Topics  . Smoking status: Former Smoker    Packs/day: 0.25    Years: 1.00    Types: Cigarettes    Quit date: 11/10/1985  . Smokeless tobacco: Never Used  . Alcohol use No    FAMILY HISTORY:   Family History  Problem Relation Age of Onset  . CAD Father        MI  . Diabetes Mellitus II Father   . Hypertension Father   . Diabetes Mellitus II Sister     DRUG ALLERGIES:   Allergies  Allergen Reactions  . Shrimp [Shellfish Allergy] Anaphylaxis    REVIEW OF SYSTEMS:  CONSTITUTIONAL:Fatigue, generalized weakness, complains of generalized body pains, both hip  pains EYES: No blurred or double vision.  EARS, NOSE, AND THROAT: No tinnitus or ear pain.  RESPIRATORY: No cough, shortness of breath, wheezing or hemoptysis.  CARDIOVASCULAR: No chest pain, orthopnea, edema.  GASTROINTESTINAL: No nausea, vomiting, diarrhea or abdominal pain.  GENITOURINARY: No dysuria, hematuria.  ENDOCRINE: No polyuria, nocturia,  HEMATOLOGY: Acute on chronic anemia SKIN: No rash or lesion. MUSCULOSKELETAL: No joint pain or arthritis.   NEUROLOGIC: No tingling, numbness, weakness.  PSYCHIATRY: Anxiety, depression.  MEDICATIONS AT HOME:   Prior to Admission medications   Medication Sig Start Date End Date Taking? Authorizing Provider  anastrozole (ARIMIDEX) 1 MG tablet Take 1 mg by mouth daily.     Yes [provider]  aspirin EC 81 MG tablet Take 81 mg by mouth daily.   Yes [provider]  Cholecalciferol (VITAMIN D3) 1000 units CAPS Take 1,000 Units by mouth daily.   Yes [provider]  clonazePAM (KLONOPIN) 0.5 MG tablet Take 0.5 mg by mouth at bedtime as needed for anxiety.    Yes [provider]  flecainide (TAMBOCOR) 100 MG tablet Take 0.5 tablets (50 mg total) by mouth every 12 (twelve) hours. 08/27/15  Yes Fritzi Mandes, MD  fludrocortisone (FLORINEF) 0.1 MG tablet Take 0.2 mg by mouth daily.   Yes [provider]  fluticasone (FLONASE) 50 MCG/ACT nasal spray Place 1 spray into the nose daily.    Yes [provider]  lidocaine (LIDODERM) 5 % Place 1-3 patches onto the skin daily. Remove & Discard patch within 12 hours or as directed by MD    Yes [provider]  loperamide (IMODIUM) 2 MG capsule Take 2 mg by mouth 4 (four) times daily as needed for diarrhea or loose stools.   Yes [provider]  loratadine (CLARITIN) 10 MG tablet Take 10 mg by mouth daily as needed for allergies.    Yes [provider]  midodrine (PROAMATINE) 5 MG tablet Take 5-10 mg by mouth 3 (three) times daily  with meals. Take 10 mg in the morning, 5 mg mid day and 5 mg at night.   Yes [provider]  mirtazapine (REMERON) 15 MG tablet Take 15 mg by mouth at bedtime.     Yes [provider]  morphine (MS CONTIN) 15 MG 12 hr tablet Take 1 tablet (15 mg total) by mouth every 8 (eight) hours. 05/30/16  Yes Vaughan Basta, MD  omeprazole (PRILOSEC) 40 MG capsule Take 40 mg by mouth daily.   Yes [provider]  oxyCODONE (ROXICODONE) 15 MG immediate release tablet Take 15 mg by mouth every 6 (six) hours as needed for pain.   Yes [provider]  potassium chloride (K-DUR) 10 MEQ tablet Take 20 mEq by mouth 2 (two) times  daily.   Yes [provider]  pregabalin (LYRICA) 150 MG capsule Take 150 mg by mouth 3 (three) times daily.   Yes [provider]  sertraline (ZOLOFT) 100 MG tablet Take 150 mg by mouth daily.    Yes [provider]  spironolactone (ALDACTONE) 25 MG tablet Take 25 mg by mouth daily.   Yes [provider]  tiZANidine (ZANAFLEX) 4 MG tablet Take 4 mg by mouth 3 (three) times daily.    Yes [provider]  triamcinolone cream (KENALOG) 0.1 % Apply 1 application topically 2 (two) times daily.   Yes [provider]  vitamin B-12 (CYANOCOBALAMIN) 1000 MCG tablet Take 1,000 mcg by mouth daily.    Yes [provider]  vitamin C (ASCORBIC ACID) 500 MG tablet Take 500 mg by mouth daily.   Yes [provider]      VITAL SIGNS:  Blood pressure 94/67, pulse 77, temperature 98.3 F (36.8 C), temperature source Oral, resp. rate 18, height 5\' 6"  (1.676 m), weight 40.8 kg (90 lb), SpO2 99 %.  PHYSICAL EXAMINATION:  GENERAL:  61 y.o.-year-old patient lying in the bed with no acute distress.  EYES: Pupils equal, round, reactive to light and accommodation. No scleral icterus. Extraocular muscles intact.  HEENT: Head atraumatic, normocephalic. Oropharynx and nasopharynx clear.  NECK:  Supple,  no jugular venous distention. No thyroid enlargement, no tenderness.  LUNGS: Normal breath sounds bilaterally, no wheezing, rales,rhonchi or crepitation. No use of accessory muscles of respiration.  CARDIOVASCULAR: S1, S2 normal. No murmurs, rubs, or gallops.  ABDOMEN: Soft, nontender, nondistended. Bowel sounds present. No organomegaly or mass.  EXTREMITIES: No pedal edema, cyanosis, or clubbing. ROM limited both legs due to pain.  NEUROLOGIC: Cranial nerves II through XII are intact. Muscle strength 5/5 in all extremities. Sensation intact. Gait not checked.  PSYCHIATRIC: The patient is alert and oriented x 3. SKIN: No obvious rash, lesion, or ulcer.   LABORATORY PANEL:   CBC  Recent Labs Lab 03/22/17 1421  WBC 8.1  HGB 7.0*  HCT 21.4*  PLT 162   ------------------------------------------------------------------------------------------------------------------  Chemistries   Recent Labs Lab 03/22/17 1421  NA 145  K 3.5  CL 115*  CO2 23  GLUCOSE 78  BUN 31*  CREATININE 1.25*  CALCIUM 8.1*  AST 27  ALT 16  ALKPHOS 114  BILITOT 0.8   ------------------------------------------------------------------------------------------------------------------  Cardiac Enzymes  Recent Labs Lab 03/22/17 1421  TROPONINI <0.03   ------------------------------------------------------------------------------------------------------------------  RADIOLOGY:  Dg Chest 1 View  Result Date: 03/22/2017 CLINICAL DATA:  Fall. EXAM: CHEST 1 VIEW COMPARISON:  11/18/2016 FINDINGS: Normal heart size and mediastinal contours. No acute infiltrate or edema. No effusion or pneumothorax. No acute osseous findings. Postoperative right axilla. IMPRESSION: No active disease. Electronically Signed   By: Monte Fantasia M.D.   On: 03/22/2017 15:25   Ct Head Wo Contrast  Result Date: 03/22/2017 CLINICAL DATA:  61 year old female status post syncope, fall from commode. EXAM: CT HEAD WITHOUT CONTRAST  TECHNIQUE: Contiguous axial images were obtained from the base of the skull through the vertex without intravenous contrast. COMPARISON:  Brain MRI 01/06/2017 and earlier. FINDINGS: Brain: Stable cerebral volume. No midline shift, ventriculomegaly, mass effect, evidence of mass lesion, intracranial hemorrhage or evidence of cortically based acute infarction. No cortical encephalomalacia. Most of the nonspecific cerebral white matter signal changes seen by MRI are occult by CT. There is mild subcortical white matter heterogeneity evident. Likewise, the chronic pontine abnormality on MRI is largely an apparent  by CT. Vascular: Calcified atherosclerosis at the skull base. Skull: Stable and negative. No acute osseous abnormality identified. Sinuses/Orbits: Visualized paranasal sinuses and mastoids are stable and well pneumatized. Other: Stable and negative orbit and scalp soft tissues. IMPRESSION: 1. No acute intracranial abnormality. No acute traumatic injury identified. 2. Stable non contrast CT appearance of the brain. Little of the chronic cerebral white matter and pontine abnormality seen by MR is evident by CT. Electronically Signed   By: Genevie Ann M.D.   On: 03/22/2017 15:26   Ct Cervical Spine Wo Contrast  Result Date: 03/22/2017 CLINICAL DATA:  Pain following fall EXAM: CT CERVICAL SPINE WITHOUT CONTRAST TECHNIQUE: Multidetector CT imaging of the cervical spine was performed without intravenous contrast. Multiplanar CT image reconstructions were also generated. COMPARISON:  None. FINDINGS: Alignment: There is no evidence spondylolisthesis. Skull base and vertebrae: Skullbase and craniocervical junction regions appear normal. There is no evident fracture. There are no blastic or lytic bone lesions. Soft tissues and spinal canal: Prevertebral soft tissues and predental space regions are normal. There are no paraspinous lesions. There is no evident cord or canal hematoma. Disc levels: There is moderate disc  space narrowing at C6-7. Other disc spaces appear unremarkable. There is a focal anterior osteophyte along the inferior aspect of C6. There is mild facet osteoarthritic change at several levels bilaterally. No nerve root edema or effacement. No disc extrusion or stenosis. Upper chest: Visualized upper lung regions are clear. Other: There are foci of carotid artery calcification bilaterally. IMPRESSION: No fracture or spondylolisthesis. Osteoarthritic change at C6-7. No disc extrusion or stenosis evident. There are foci of carotid artery calcification bilaterally. Electronically Signed   By: Lowella Grip III M.D.   On: 03/22/2017 15:07    EKG:   Orders placed or performed during the hospital encounter of 03/22/17  . ED EKG  . ED EKG   assessment and plan #1 syncope; ;likely  Secondary due  to hypotension and dehydration: Admit to telemetry to evaluate for arrhythmias, continue IV hydration.   #2 acute on chronic anemia of chronic disease: Transfuse 1 unit of packed RBC. #3. She of avascular necrosis of the right femoral neck: Having chronic pain issues orthopedic consult recommended by [facility].' Patient already on high-dose of narcotics, lyrica,,, I cannot give any more further doses of narcotics. #4 chronic pain syndrome #5 fibromyalgia #6) depression'continue on Zoloft #7 history of breast cancer #8 anxiety'continue Klonopin  9 chronic hypotension : Continue fludrocortisone, and midodrine; Has been accepted at [facility] and waiting for bed.  All the records are reviewed and case discussed with ED provider. Management plans discussed with the patient, family and they are in agreement.  CODE STATUS: full  TOTAL TIME TAKING CARE OF THIS PATIENT: 55 minutes.    Epifanio Lesches M.D on 03/22/2017 at 4:26 PM  Between 7am to 6pm - Pager - [phone]  After 6pm go to [email or web address] - password [email or web address] [place] [facility]  Office  [phone]  CC: Primary care physician;  System, Pcp Not In  Note: This dictation was prepared with Dragon dictation along with smaller phrase technology. Any transcriptional errors that result from this process are unintentional.

## 2017-03-22 NOTE — ED Notes (Signed)
Patient transported to CT 

## 2017-03-22 NOTE — ED Notes (Signed)
Pt given water and applesauce. RN fed pt

## 2017-03-22 NOTE — ED Triage Notes (Signed)
Patient brought from home with fall from commode.  Says she hurts all over.  Says she is a chronic pain patient.  Says she does want to die when asked about suicide thoughts.  No plan.  Says she wants to die due to her pain.  During start of triage she is on the phone with [facility] primary telling them that the ems wouuld not take her to [facility] as requested.  She says she did pass out, but then says she could not get up and she was alone.  Says she has an aid to help her at home.

## 2017-03-22 NOTE — ED Notes (Signed)
Pt requesting covers be pulled up, food ordered, pain meds be given.  Meal tray ordered and covers were pulled up.  Pt keeps requesting pain medication but when leave room and come back pt always sleeping.

## 2017-03-22 NOTE — ED Notes (Signed)
Admitting MD at bedside.

## 2017-03-22 NOTE — ED Provider Notes (Addendum)
[facility] Emergency Department Provider Note  ____________________________________________   First MD Initiated Contact with Patient 03/22/17 1404     (approximate)  I have reviewed the triage vital signs and the nursing notes.   HISTORY  Chief Complaint Fall   HPI Lindsay [name] is a 61 y.o. female who comes to the emergency department via EMS for syncopal episode while on the toilet today. The patient has a litany of complex medical issues includingcardiomyopathy, atrial fibrillation, congestive heart failure, chronic pain, now nutrition, right arm weakness, fibromyalgia. Her primary care physician who is affiliated with [facility] called her yesterday asking her to call 911 so she could be admitted to [facility]. The patient declined at the time but after she syncopized today she called 911. She requested to go to [facility], however EMS was unable to take her and she was diverted here instead. She says that for the past 2 weeks she has had increasing weakness and frequent falls. She lives alone at home.   Past Medical History:  Diagnosis Date  . Anemia of chronic disease   . C. difficile diarrhea 10/2011  . Cancer ([place])   . Chronic cough   . Chronic systolic CHF (congestive heart failure) ([place])    a. echo 05/2015: EF 45-50%, nl wall motion, mod MR, nl PASP  . Depression   . Depression   . Fibromyalgia   . History of DVT (deep vein thrombosis)    a. s/p IVC filter 2014  . HX: breast cancer    a. right-side; b. s/p lumpectomy & chemo w/ Docetaxol, cytoxan, adriamycin  . Kidney stones 2013  . Malnutrition ([place])    a. chronic diarrhea s/p most meals   . Moderate mitral regurgitation   . Muscle spasm   . NICM (nonischemic cardiomyopathy) ([place])    a. Lexiscan 08/2015: no sig ischemia, no ECG changes concerning for ischemia (baseline abnormal T wave along anterolateral leads), EF 61%, low risk scan  . Orthostatic hypotension   . PAF (paroxysmal atrial  fibrillation) ([place])    a. not on long term, full-dose anticoagulation since 2011, had high INR at that time 2/2 possible malnutition, has been off anticoagulation since; b. CHADS2VASc at least 2 (CHF, female); c. 48 hr Holter 09/2015 NSR w/ rare PACs, no significant PVCs, periods of sinus tachycarida with average heart rate typically in the 60-100 range. No sig arrhythmia. No Afib seen.   . 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 Postural hypotension   . S/P gastric bypass   . Vitamin D deficiency     Patient Active Problem List   Diagnosis Date Noted  . Right arm weakness 01/05/2017  . Chest pain 05/29/2016  . Elevated troponin 05/29/2016  . Atrial flutter ([place]) 05/29/2016  . Postural hypotension   . Moderate mitral regurgitation   . PAF (paroxysmal atrial fibrillation) ([place])   . Malnutrition ([place])   . S/P gastric bypass   . Fibromyalgia   . Peripheral neuropathy   . NICM (nonischemic cardiomyopathy) ([place])   . Chronic systolic CHF (congestive heart failure) ([place])   . Recurrent major depressive disorder, in partial remission ([place])   . Major depression 08/26/2015  . Weakness of both legs   . Absolute anemia   . Pain in the chest   . Orthostatic hypotension   . Paroxysmal atrial fibrillation ([facility])   . Syncope and collapse   . Paroxysmal A-fib ([place])   . Atrial fibrillation ([place]) 05/24/2015  .  Abdominal pain 05/23/2015  . C. difficile colitis 12/12/2011  . Chest pain 11/11/2011  . Syncope 11/11/2011  . Hypotension 11/11/2011  . Cardiomyopathy 11/11/2011  . AF (atrial fibrillation) ([place]) 11/11/2011    Past Surgical History:  Procedure Laterality Date  . ABDOMINAL HYSTERECTOMY    . BREAST LUMPECTOMY     right @ [facility]  . ESOPHAGOGASTRODUODENOSCOPY (EGD) WITH PROPOFOL N/A 05/25/2015   Procedure: ESOPHAGOGASTRODUODENOSCOPY (EGD) WITH PROPOFOL;  Surgeon: Manya Silvas, MD;  Location: [facility] ENDOSCOPY;  Service: Endoscopy;  Laterality: N/A;  . LITHOTRIPSY  jan 2013     Prior to Admission medications   Medication Sig Start Date End Date Taking? Authorizing Provider  anastrozole (ARIMIDEX) 1 MG tablet Take 1 mg by mouth daily.     Yes [provider]  aspirin EC 81 MG tablet Take 81 mg by mouth daily.   Yes [provider]  Cholecalciferol (VITAMIN D3) 1000 units CAPS Take 1,000 Units by mouth daily.   Yes [provider]  clonazePAM (KLONOPIN) 0.5 MG tablet Take 0.5 mg by mouth at bedtime as needed for anxiety.    Yes [provider]  flecainide (TAMBOCOR) 100 MG tablet Take 0.5 tablets (50 mg total) by mouth every 12 (twelve) hours. 08/27/15  Yes Fritzi Mandes, MD  fludrocortisone (FLORINEF) 0.1 MG tablet Take 0.2 mg by mouth daily.   Yes [provider]  fluticasone (FLONASE) 50 MCG/ACT nasal spray Place 1 spray into the nose daily.    Yes [provider]  lidocaine (LIDODERM) 5 % Place 1-3 patches onto the skin daily. Remove & Discard patch within 12 hours or as directed by MD    Yes [provider]  loperamide (IMODIUM) 2 MG capsule Take 2 mg by mouth 4 (four) times daily as needed for diarrhea or loose stools.   Yes [provider]  loratadine (CLARITIN) 10 MG tablet Take 10 mg by mouth daily as needed for allergies.    Yes [provider]  midodrine (PROAMATINE) 5 MG tablet Take 5-10 mg by mouth 3 (three) times daily with meals. Take 10 mg in the morning, 5 mg mid day and 5 mg at night.   Yes [provider]  mirtazapine (REMERON) 15 MG tablet Take 15 mg by mouth at bedtime.     Yes [provider]  morphine (MS CONTIN) 15 MG 12 hr tablet Take 1 tablet (15 mg total) by mouth every 8 (eight) hours. 05/30/16  Yes Vaughan Basta, MD  omeprazole (PRILOSEC) 40 MG capsule Take 40 mg by mouth daily.   Yes [provider]  oxyCODONE (ROXICODONE) 15 MG immediate release tablet Take 15 mg by mouth every 6 (six) hours as needed for pain.   Yes [provider]  potassium chloride (K-DUR) 10 MEQ tablet Take 20 mEq by mouth 2 (two) times daily.   Yes [provider]  pregabalin (LYRICA) 150 MG capsule Take 150 mg by mouth 3 (three) times daily.   Yes [provider]  sertraline (ZOLOFT) 100 MG tablet Take 150 mg by mouth daily.    Yes [provider]  spironolactone (ALDACTONE) 25 MG tablet Take 25 mg by mouth daily.   Yes [provider]  tiZANidine (ZANAFLEX) 4 MG tablet Take 4 mg by mouth 3 (three) times daily.    Yes [provider]  triamcinolone cream (KENALOG) 0.1 % Apply 1 application topically 2 (two) times daily.   Yes [provider]  vitamin B-12 (CYANOCOBALAMIN)  1000 MCG tablet Take 1,000 mcg by mouth daily.    Yes [provider]  vitamin C (ASCORBIC ACID) 500 MG tablet Take 500 mg by mouth daily.   Yes [provider]    Allergies Shrimp [shellfish allergy]  Family History  Problem Relation Age of Onset  . CAD Father        MI  . Diabetes Mellitus II Father   . Hypertension Father   . Diabetes Mellitus II Sister     Social History Social History  Substance Use Topics  . Smoking status: Former Smoker    Packs/day: 0.25    Years: 1.00    Types: Cigarettes    Quit date: 11/10/1985  . Smokeless tobacco: Never Used  . Alcohol use No    Review of Systems Constitutional: No fever/chills Eyes: No visual changes. ENT: No sore throat. Cardiovascular: Denies chest pain. Respiratory: Denies shortness of breath. Gastrointestinal: No abdominal pain.  No nausea, no vomiting.  No diarrhea.  No constipation. Genitourinary: Negative for dysuria. Musculoskeletal: Negative for back pain. Skin: Negative for rash. Neurological: Negative for headaches, focal weakness or numbness.   ____________________________________________   PHYSICAL EXAM:  VITAL SIGNS: ED Triage Vitals  Enc Vitals Group     BP 03/22/17 1345 96/84     Pulse Rate 03/22/17  1345 83     Resp 03/22/17 1345 16     Temp 03/22/17 1345 98.3 F (36.8 C)     Temp Source 03/22/17 1345 Oral     SpO2 03/22/17 1345 99 %     Weight 03/22/17 1346 90 lb (40.8 kg)     Height 03/22/17 1346 5\' 6"  (1.676 m)     Head Circumference --      Peak Flow --      Pain Score 03/22/17 1344 10     Pain Loc --      Pain Edu? --      Excl. in [place]? --     Constitutional: Alert and oriented x 4 Chronically ill appearing tearful no respiratory distress Eyes: PERRL EOMI. Head: Atraumatic. Nose: No congestion/rhinnorhea. Mouth/Throat: No trismus Neck: No stridor.   Cardiovascular: Normal rate, regular rhythm. Grossly normal heart sounds.  Good peripheral circulation. Respiratory: Normal respiratory effort.  No retractions. Lungs CTAB and moving good air Gastrointestinal: Soft nondistended nontender no rebound or guarding no peritonitis Guaiac positive control positive brown stool Musculoskeletal: No lower extremity edema   Neurologic: Decreased strength right upper extremity otherwise normal strength Skin:  Skin is warm, dry and intact. No rash noted. Psychiatric: Sad affect    ____________________________________________   DIFFERENTIAL  Cardiogenic syncope, vasovagal syncope, anemia, metabolic arrangement, urinary tract infection, intracerebral hemorrhage ____________________________________________   LABS (all labs ordered are listed, but only abnormal results are displayed)  Labs Reviewed  COMPREHENSIVE METABOLIC PANEL - Abnormal; Notable for the following:       Result Value   Chloride 115 (*)    BUN 31 (*)    Creatinine, Ser 1.25 (*)    Calcium 8.1 (*)    Total Protein 4.7 (*)    Albumin 2.1 (*)    GFR calc non Af Amer 46 (*)    GFR calc Af Amer 54 (*)    All other components within normal limits  CBC WITH DIFFERENTIAL/PLATELET - Abnormal; Notable for the following:    RBC 2.14 (*)    Hemoglobin 7.0 (*)    HCT 21.4 (*)    RDW 16.9 (*)  All other components  within normal limits  TROPONIN I  CK  URINALYSIS, COMPLETE (UACMP) WITH MICROSCOPIC    Anemia of 7 macrocytic from previous concerning for 12 deficiency folate deficiency or anemia of chronic disease __________________________________________  EKG  ED ECG REPORT I, Darel Hong, the attending physician, personally viewed and interpreted this ECG.  Date: 03/22/2017 Rate: 93 Rhythm: normal sinus rhythm QRS Axis: normal Intervals: normal ST/T Wave abnormalities: normal Conduction Disturbances: none Narrative Interpretation: unremarkable  ____________________________________________  RADIOLOGY  Head and neck CTs with no acute disease chest x-ray with no acute disease ____________________________________________   PROCEDURES  Procedure(s) performed: no  Procedures  Critical Care performed: no  Observation: no ____________________________________________   INITIAL IMPRESSION / ASSESSMENT AND PLAN / ED COURSE  Pertinent labs & imaging results that were available during my care of the patient were reviewed by me and considered in my medical decision making (see chart for details).  The patient has complex past medical history but her acute medical issues are worsening weakness, falls, and syncope. Her hemoglobin is down today although his macrocytic so likely not from GI etiology. Head CT and C-spine CT are unremarkable and her EKG is nonischemic.     ----------------------------------------- 3:16 PM on 03/22/2017 -----------------------------------------  I discussed the case with [facility] hospitalist Dr. Maryln Manuel who has graciously agreed to admit the patient to his service and has accepted as a transfer, however [facility] is on diversion right now and I will be roughly 24 hours before they can accept. In the meantime he is recommending inpatient admission to our facility for hospitalist medical optimization. ____________________________________________   FINAL  CLINICAL IMPRESSION(S) / ED DIAGNOSES  Final diagnoses:  Syncope, unspecified syncope type  Anemia, unspecified type      NEW MEDICATIONS STARTED DURING THIS VISIT:  New Prescriptions   No medications on file     Note:  This document was prepared using Dragon voice recognition software and may include unintentional dictation errors.     Darel Hong, MD 03/22/17 [place], Mckinsey Keagle, MD 03/22/17 [id number]

## 2017-03-22 NOTE — ED Notes (Signed)
Mother called. Attempted to give pt phone to call mother but does not want to call.  Mother called back requesting to speak with her informed pt does not wish to talk right now. No info given currently.

## 2017-03-22 NOTE — ED Notes (Addendum)
Pt given something to drink. Ok per dr Mable Paris. Pt asking for pain meds. MD notified.

## 2017-03-22 NOTE — ED Notes (Signed)
Pt is sleeping. NAD

## 2017-03-22 NOTE — ED Notes (Signed)
Pt placed on bedpan. Unable to go to bathroom.

## 2017-03-23 ENCOUNTER — Inpatient Hospital Stay: Payer: Medicare Other

## 2017-03-23 DIAGNOSIS — G629 Polyneuropathy, unspecified: Secondary | ICD-10-CM | POA: Diagnosis present

## 2017-03-23 DIAGNOSIS — W050XXA Fall from non-moving wheelchair, initial encounter: Secondary | ICD-10-CM | POA: Diagnosis present

## 2017-03-23 DIAGNOSIS — E43 Unspecified severe protein-calorie malnutrition: Secondary | ICD-10-CM | POA: Diagnosis present

## 2017-03-23 DIAGNOSIS — R55 Syncope and collapse: Secondary | ICD-10-CM | POA: Diagnosis not present

## 2017-03-23 DIAGNOSIS — D649 Anemia, unspecified: Secondary | ICD-10-CM | POA: Diagnosis not present

## 2017-03-23 DIAGNOSIS — M62838 Other muscle spasm: Secondary | ICD-10-CM | POA: Diagnosis present

## 2017-03-23 DIAGNOSIS — D638 Anemia in other chronic diseases classified elsewhere: Secondary | ICD-10-CM | POA: Diagnosis present

## 2017-03-23 DIAGNOSIS — R627 Adult failure to thrive: Secondary | ICD-10-CM | POA: Diagnosis present

## 2017-03-23 DIAGNOSIS — M8700 Idiopathic aseptic necrosis of unspecified bone: Secondary | ICD-10-CM | POA: Diagnosis not present

## 2017-03-23 DIAGNOSIS — I5022 Chronic systolic (congestive) heart failure: Secondary | ICD-10-CM | POA: Diagnosis present

## 2017-03-23 DIAGNOSIS — I480 Paroxysmal atrial fibrillation: Secondary | ICD-10-CM | POA: Diagnosis present

## 2017-03-23 DIAGNOSIS — Z9221 Personal history of antineoplastic chemotherapy: Secondary | ICD-10-CM | POA: Diagnosis not present

## 2017-03-23 DIAGNOSIS — K648 Other hemorrhoids: Secondary | ICD-10-CM | POA: Diagnosis not present

## 2017-03-23 DIAGNOSIS — F338 Other recurrent depressive disorders: Secondary | ICD-10-CM | POA: Diagnosis present

## 2017-03-23 DIAGNOSIS — M879 Osteonecrosis, unspecified: Secondary | ICD-10-CM | POA: Diagnosis present

## 2017-03-23 DIAGNOSIS — I429 Cardiomyopathy, unspecified: Secondary | ICD-10-CM | POA: Diagnosis present

## 2017-03-23 DIAGNOSIS — M2540 Effusion, unspecified joint: Secondary | ICD-10-CM | POA: Diagnosis present

## 2017-03-23 DIAGNOSIS — K529 Noninfective gastroenteritis and colitis, unspecified: Secondary | ICD-10-CM | POA: Diagnosis present

## 2017-03-23 DIAGNOSIS — R001 Bradycardia, unspecified: Secondary | ICD-10-CM | POA: Diagnosis not present

## 2017-03-23 DIAGNOSIS — K623 Rectal prolapse: Secondary | ICD-10-CM | POA: Diagnosis not present

## 2017-03-23 DIAGNOSIS — E860 Dehydration: Secondary | ICD-10-CM | POA: Diagnosis present

## 2017-03-23 DIAGNOSIS — M25559 Pain in unspecified hip: Secondary | ICD-10-CM | POA: Diagnosis not present

## 2017-03-23 DIAGNOSIS — F332 Major depressive disorder, recurrent severe without psychotic features: Secondary | ICD-10-CM | POA: Diagnosis not present

## 2017-03-23 DIAGNOSIS — M797 Fibromyalgia: Secondary | ICD-10-CM | POA: Diagnosis present

## 2017-03-23 DIAGNOSIS — E46 Unspecified protein-calorie malnutrition: Secondary | ICD-10-CM | POA: Diagnosis not present

## 2017-03-23 DIAGNOSIS — F419 Anxiety disorder, unspecified: Secondary | ICD-10-CM | POA: Diagnosis present

## 2017-03-23 DIAGNOSIS — G894 Chronic pain syndrome: Secondary | ICD-10-CM | POA: Diagnosis present

## 2017-03-23 DIAGNOSIS — M21331 Wrist drop, right wrist: Secondary | ICD-10-CM | POA: Diagnosis not present

## 2017-03-23 DIAGNOSIS — D530 Protein deficiency anemia: Secondary | ICD-10-CM | POA: Diagnosis not present

## 2017-03-23 DIAGNOSIS — Z86718 Personal history of other venous thrombosis and embolism: Secondary | ICD-10-CM | POA: Diagnosis not present

## 2017-03-23 DIAGNOSIS — Z515 Encounter for palliative care: Secondary | ICD-10-CM | POA: Diagnosis not present

## 2017-03-23 DIAGNOSIS — I951 Orthostatic hypotension: Secondary | ICD-10-CM | POA: Diagnosis not present

## 2017-03-23 DIAGNOSIS — Z7189 Other specified counseling: Secondary | ICD-10-CM | POA: Diagnosis not present

## 2017-03-23 LAB — BPAM RBC
Blood Product Expiration Date: 201806072359
ISSUE DATE / TIME: 201805161835
Unit Type and Rh: 5100

## 2017-03-23 LAB — CBC
HCT: 25.6 % — ABNORMAL LOW (ref 35.0–47.0)
Hemoglobin: 8.6 g/dL — ABNORMAL LOW (ref 12.0–16.0)
MCH: 32.6 pg (ref 26.0–34.0)
MCHC: 33.7 g/dL (ref 32.0–36.0)
MCV: 96.6 fL (ref 80.0–100.0)
PLATELETS: 142 10*3/uL — AB (ref 150–440)
RBC: 2.65 MIL/uL — ABNORMAL LOW (ref 3.80–5.20)
RDW: 18 % — ABNORMAL HIGH (ref 11.5–14.5)
WBC: 9.1 10*3/uL (ref 3.6–11.0)

## 2017-03-23 LAB — TYPE AND SCREEN
ABO/RH(D): O POS
Antibody Screen: NEGATIVE
UNIT DIVISION: 0

## 2017-03-23 LAB — VITAMIN B12: Vitamin B-12: 821 pg/mL (ref 180–914)

## 2017-03-23 LAB — FERRITIN: FERRITIN: 129 ng/mL (ref 11–307)

## 2017-03-23 LAB — BASIC METABOLIC PANEL
Anion gap: 6 (ref 5–15)
BUN: 23 mg/dL — AB (ref 6–20)
CALCIUM: 7.5 mg/dL — AB (ref 8.9–10.3)
CHLORIDE: 114 mmol/L — AB (ref 101–111)
CO2: 23 mmol/L (ref 22–32)
Creatinine, Ser: 1.21 mg/dL — ABNORMAL HIGH (ref 0.44–1.00)
GFR calc Af Amer: 56 mL/min — ABNORMAL LOW (ref >60)
GFR calc non Af Amer: 48 mL/min — ABNORMAL LOW (ref >60)
GLUCOSE: 110 mg/dL — AB (ref 65–99)
Potassium: 3.1 mmol/L — ABNORMAL LOW (ref 3.5–5.1)
Sodium: 143 mmol/L (ref 135–145)

## 2017-03-23 LAB — CK: Total CK: 56 U/L (ref 38–234)

## 2017-03-23 LAB — GLUCOSE, CAPILLARY: Glucose-Capillary: 97 mg/dL (ref 65–99)

## 2017-03-23 LAB — LACTATE DEHYDROGENASE: LDH: 212 U/L — ABNORMAL HIGH (ref 98–192)

## 2017-03-23 MED ORDER — MORPHINE SULFATE ER 15 MG PO TBCR
15.0000 mg | EXTENDED_RELEASE_TABLET | Freq: Three times a day (TID) | ORAL | Status: DC
  Administered 2017-03-23 – 2017-04-02 (×28): 15 mg via ORAL
  Filled 2017-03-23 (×29): qty 1

## 2017-03-23 MED ORDER — CYCLOBENZAPRINE HCL 10 MG PO TABS: 5.0000 mg | ORAL_TABLET | Freq: Three times a day (TID) | ORAL | Status: DC | PRN

## 2017-03-23 MED ORDER — CEFTRIAXONE SODIUM 1 G IJ SOLR
1.0000 g | INTRAMUSCULAR | Status: DC
  Administered 2017-03-23 – 2017-03-28 (×6): 1 g via INTRAVENOUS
  Filled 2017-03-23 (×7): qty 10

## 2017-03-23 MED ORDER — ENOXAPARIN SODIUM 40 MG/0.4ML ~~LOC~~ SOLN
40.0000 mg | SUBCUTANEOUS | Status: DC
  Administered 2017-03-23 – 2017-04-01 (×9): 40 mg via SUBCUTANEOUS
  Filled 2017-03-23 (×9): qty 0.4

## 2017-03-23 MED ORDER — MIDODRINE HCL 5 MG PO TABS
10.0000 mg | ORAL_TABLET | Freq: Three times a day (TID) | ORAL | Status: DC
  Administered 2017-03-23 – 2017-04-02 (×24): 10 mg via ORAL
  Filled 2017-03-23 (×25): qty 2

## 2017-03-23 NOTE — Progress Notes (Signed)
PT Cancellation Note  Patient Details Name: Shaquia Berkley MRN: [id number] DOB: December 30, 1955   Cancelled Treatment:    Reason Eval/Treat Not Completed: Other (comment). Consult received and chart reviewed. Per review, pt with decreased BP (57/37) with standing at this time. Positive orthostatic at this time. Pt also with pending ortho consult for R hip AVN. Will hold therapy intervention at this time. Will re-attempt when medically clear.   Zanai Mallari 03/23/2017, 2:51 PM  Greggory Stallion, PT, DPT [phone]

## 2017-03-23 NOTE — Consult Note (Signed)
Aware of consult.  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    Patient wants to go to [facility] and has been accepted there.  Awaiting transfer.  I have ordered a right hip series to ensure there is no fracture after her fall.   MRI of the right hip may not be necessary if she is going to [facility].  If AVN is the cause of her pain I recommend she see her doctor at [facility] for definitive surgery.    In regards to wrist drop, the admission H&P suggests this is a chronic problem.  Neurology consult should be ordered if this is an acute finding.  Her wrist may be splinted for support, but no immediate orthopaedic surgery is required.    I will follow up with the xrays of the right hip I have ordered.   A full consult note with be left at that time.  It sounds like both issues are chronic and she will not need acute orthopaedic intervention while she is here but should follow up with her regular orthopaedic doctors at [facility] after her transfer.  Recommend PT evaluation if hip xrays are negative for fracture while she awaits transfer.    Thornton Park, MD    03/23/2017 5:45 PM

## 2017-03-23 NOTE — Progress Notes (Signed)
Patient ID: Lindsay [name], female   DOB: 1955/12/19, 61 y.o.   MRN: [id number]  [employer] PROGRESS NOTE  Avangelina Flight BSJ:[id number] DOB: 24-Mar-1956 DOA: 03/22/2017 PCP: [facility], Pcp Not In  HPI/Subjective: Patient in a lot of pain. Even with light touch on her legs she is in a lot of pain. The patient complains of severe pain in her right hip. She also has pain in her shoulder. Patient came in because she passed out. The patient was found to have anemia and severe orthostatic hypotension.  Objective: Vitals:   03/23/17 1310 03/23/17 1624  BP: (!) 57/37 94/62  Pulse: 79 66  Resp:    Temp:      Filed Weights   03/22/17 1346 03/23/17 0313  Weight: 40.8 kg (90 lb) 50.8 kg (112 lb)    ROS: Review of Systems  Constitutional: Negative for chills and fever.  Eyes: Negative for blurred vision.  Respiratory: Negative for cough and shortness of breath.   Cardiovascular: Negative for chest pain.  Gastrointestinal: Negative for abdominal pain, constipation, diarrhea, nausea and vomiting.  Genitourinary: Negative for dysuria.  Musculoskeletal: Positive for joint pain and myalgias.  Neurological: Negative for dizziness and headaches.   Exam: Physical Exam  HENT:  Nose: No mucosal edema.  Mouth/Throat: No oropharyngeal exudate.  Eyes: Conjunctivae, EOM and lids are normal. Pupils are equal, round, and reactive to light.  Neck: Trachea normal. Carotid bruit is not present. No thyromegaly present.  Cardiovascular: Regular rhythm, S1 normal, S2 normal and normal heart sounds.   Respiratory: She has no decreased breath sounds. She has no wheezes. She has no rhonchi.  GI: Soft. Bowel sounds are normal. There is no tenderness.  Musculoskeletal:       Right hip: She exhibits decreased range of motion and tenderness.       Right ankle: She exhibits no swelling.       Left ankle: She exhibits no swelling.  Neurological: She is alert.  Right wrist drop. Patient able to flex and extend at  bilateral ankles. Patient able to lift her legs up off the bed. Painful range of motion right hip.  Skin: Skin is warm. No rash noted.  Patient does have some bruises and cuts on her lower extremities.  Psychiatric: She has a normal mood and affect.  Patient yelling out in pain      Data Reviewed: Basic Metabolic Panel:  Recent Labs Lab 03/22/17 1421 03/23/17 0552  NA 145 143  K 3.5 3.1*  CL 115* 114*  CO2 23 23  GLUCOSE 78 110*  BUN 31* 23*  CREATININE 1.25* 1.21*  CALCIUM 8.1* 7.5*   Liver Function Tests:  Recent Labs Lab 03/22/17 1421  AST 27  ALT 16  ALKPHOS 114  BILITOT 0.8  PROT 4.7*  ALBUMIN 2.1*   CBC:  Recent Labs Lab 03/22/17 1421 03/23/17 0552  WBC 8.1 9.1  NEUTROABS 6.3  --   HGB 7.0* 8.6*  HCT 21.4* 25.6*  MCV 100.0 96.6  PLT 162 142*   Cardiac Enzymes:  Recent Labs Lab 03/22/17 1421 03/23/17 1158  CKTOTAL 80 56  TROPONINI <0.03  --     CBG:  Recent Labs Lab 03/23/17 0753  GLUCAP 97     Studies: Dg Chest 1 View  Result Date: 03/22/2017 CLINICAL DATA:  Fall. EXAM: CHEST 1 VIEW COMPARISON:  11/18/2016 FINDINGS: Normal heart size and mediastinal contours. No acute infiltrate or edema. No effusion or pneumothorax. No acute osseous findings. Postoperative right axilla. IMPRESSION:  No active disease. Electronically Signed   By: Monte Fantasia M.D.   On: 03/22/2017 15:25   Ct Head Wo Contrast  Result Date: 03/22/2017 CLINICAL DATA:  61 year old female status post syncope, fall from commode. EXAM: CT HEAD WITHOUT CONTRAST TECHNIQUE: Contiguous axial images were obtained from the base of the skull through the vertex without intravenous contrast. COMPARISON:  Brain MRI 01/06/2017 and earlier. FINDINGS: Brain: Stable cerebral volume. No midline shift, ventriculomegaly, mass effect, evidence of mass lesion, intracranial hemorrhage or evidence of cortically based acute infarction. No cortical encephalomalacia. Most of the nonspecific  cerebral white matter signal changes seen by MRI are occult by CT. There is mild subcortical white matter heterogeneity evident. Likewise, the chronic pontine abnormality on MRI is largely an apparent by CT. Vascular: Calcified atherosclerosis at the skull base. Skull: Stable and negative. No acute osseous abnormality identified. Sinuses/Orbits: Visualized paranasal sinuses and mastoids are stable and well pneumatized. Other: Stable and negative orbit and scalp soft tissues. IMPRESSION: 1. No acute intracranial abnormality. No acute traumatic injury identified. 2. Stable non contrast CT appearance of the brain. Little of the chronic cerebral white matter and pontine abnormality seen by MR is evident by CT. Electronically Signed   By: Genevie Ann M.D.   On: 03/22/2017 15:26   Ct Cervical Spine Wo Contrast  Result Date: 03/22/2017 CLINICAL DATA:  Pain following fall EXAM: CT CERVICAL SPINE WITHOUT CONTRAST TECHNIQUE: Multidetector CT imaging of the cervical spine was performed without intravenous contrast. Multiplanar CT image reconstructions were also generated. COMPARISON:  None. FINDINGS: Alignment: There is no evidence spondylolisthesis. Skull base and vertebrae: Skullbase and craniocervical junction regions appear normal. There is no evident fracture. There are no blastic or lytic bone lesions. Soft tissues and spinal canal: Prevertebral soft tissues and predental space regions are normal. There are no paraspinous lesions. There is no evident cord or canal hematoma. Disc levels: There is moderate disc space narrowing at C6-7. Other disc spaces appear unremarkable. There is a focal anterior osteophyte along the inferior aspect of C6. There is mild facet osteoarthritic change at several levels bilaterally. No nerve root edema or effacement. No disc extrusion or stenosis. Upper chest: Visualized upper lung regions are clear. Other: There are foci of carotid artery calcification bilaterally. IMPRESSION: No fracture  or spondylolisthesis. Osteoarthritic change at C6-7. No disc extrusion or stenosis evident. There are foci of carotid artery calcification bilaterally. Electronically Signed   By: Lowella Grip III M.D.   On: 03/22/2017 15:07    Scheduled Meds: . anastrozole  1 mg Oral Daily  . aspirin EC  81 mg Oral Daily  . cholecalciferol  1,000 Units Oral Daily  . docusate sodium  100 mg Oral BID  . enoxaparin (LOVENOX) injection  40 mg Subcutaneous Q24H  . flecainide  50 mg Oral Q12H  . fludrocortisone  0.2 mg Oral Daily  . fluticasone  1 spray Each Nare Daily  . midodrine  10 mg Oral TID WC  . mirtazapine  15 mg Oral QHS  . morphine  15 mg Oral Q8H  . pantoprazole  40 mg Oral Daily  . potassium chloride  20 mEq Oral BID  . pregabalin  150 mg Oral TID  . sertraline  150 mg Oral QHS  . vitamin B-12  1,000 mcg Oral Daily  . vitamin C  500 mg Oral Daily   Continuous Infusions: . sodium chloride    . cefTRIAXone (ROCEPHIN)  IV Stopped (03/23/17 0910)    Assessment/Plan:  1. Severe anemia. Patient transfuse 1 unit of packed red blood cells. Ferritin 129. Continue to monitor. Guaiac stools. 2. Fever. Empiric Rocephin. Send off urine analysis and urine culture 3. Severe orthostatic hypotension. Increase midodrine to 10 mg 3 times a day. Already on Florinef. Need to discontinue Zanaflex. Physical therapy evaluation. Legs to painful for TED hose at this point. 4. Severe pain in lower extremities right hip and shoulder. Avascular necrosis of right hip seen on prior MRI at [facility]. Palliative care consultation for pain management. Orthopedic surgery consultation. 5. History of breast cancer on anastrozole 6. Neuropathy on Lyrica 7. Depression on Zoloft 8. Severe malnutrition 9. History of congestive heart failure but no signs currently 10. Atrial fibrillation on flecainide  Code Status:     Code Status Orders        Start     Ordered   03/22/17 1611  Full code  Continuous     03/22/17 1612     Code Status History    Date Active Date Inactive Code Status Order ID Comments User Context   01/05/2017  4:48 PM 01/06/2017  9:37 PM Full Code [id number]  Demetrios Loll, MD Inpatient   05/29/2016  4:24 PM 05/30/2016  8:01 AM Full Code [id number]  Idelle Crouch, MD Inpatient   08/23/2015  4:14 PM 08/27/2015  7:54 PM Full Code [id number]  Fritzi Mandes, MD ED   05/23/2015 11:00 AM 05/26/2015  3:23 PM Full Code [id number]  Henreitta Leber, MD Inpatient     Disposition Plan: To be determined. I don't think that she is safe to go home by herself.  Consultants:  Palliative care  Orthopedic surgery  Physical therapy  Antibiotics:  Rocephin  Time spent: 35 minutes  [place], [place]

## 2017-03-23 NOTE — Progress Notes (Addendum)
Initial Nutrition Assessment  DOCUMENTATION CODES:   Severe malnutrition in context of chronic illness  INTERVENTION:  1. Magic cup TID with meals, each supplement provides 290 kcal and 9 grams of protein 2. Recommend liberalize diet to regular  NUTRITION DIAGNOSIS:   Malnutrition (Severe) related to chronic illness as evidenced by severe depletion of body fat, severe depletion of muscle mass.  GOAL:   Patient will meet greater than or equal to 90% of their needs  MONITOR:   PO intake, I & O's, Labs, Supplement acceptance  REASON FOR ASSESSMENT:   Malnutrition Screening Tool    ASSESSMENT:   Lindsay [name]  is a 61 y.o. female with a known history ofPossible medical problems of chronic atrial fibrillation, fibromyalgia, chronic pain syndrome had a syncopal episode in the bathroom today.she called 911 and EMS  Brought her.. She requested to go to [facility] ,Patient has been accepted at [facility], but they don't have beds for next 24 hours.  Spoke with Ms. Lindsay [name] at bedside. She reports good PO intake PTA.  Normally eats cereal for breakfast, [place] food or some other sort of takeout for lunch. Had 2 hot dogs and a bag of chips for dinner yesterday. Also eats snacks "like the ones you guys give me here." However, she continues to report weight loss, states a UBW of 125#, currently 112#, indicating a 13#/10% weight loss over unspecified time period. Patient could not remember when she started losing weight. She is unsure of what has caused it. Did not eat much of her tray this morning - complained the food was too bland, but states she would eat our food if it was not bland.  Nutrition-Focused physical exam completed. Findings are moderate-severe fat depletion, severe muscle depletion, and severe edema.   Labs and medications reviewed: K 3.1 Vitamin D, Colace, Cyanocobalamin, Vitamin C, Remeron  Diet Order:  Diet regular Room service appropriate? Yes; Fluid consistency: Thin  Skin:   Reviewed, no issues  Last BM:  03/15/2017  Height:   Ht Readings from Last 1 Encounters:  03/22/17 5\' 6"  (1.676 m)    Weight:   Wt Readings from Last 1 Encounters:  03/23/17 112 lb (50.8 kg)    Ideal Body Weight:  59.09 kg  BMI:  Body mass index is 18.08 kg/m.  Estimated Nutritional Needs:   Kcal:  1500-1800 calories  Protein:  61-76 gm  Fluid:  >/= 1.5L  EDUCATION NEEDS:   No education needs identified at this time  Lindsay [name]. Lindsay Coate, MS, RD LDN Inpatient Clinical Dietitian Pager [phone]

## 2017-03-23 NOTE — Care Management (Signed)
Placed in observation for syncope. 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 It is difficult to carry on conversation with patient because she was very focused on what she did not like about her breakfast.  She receives medicaid personal care services 7 days a week.  She has a companion that assists her and has medicaid transportation services.  She has had [facility] home health services int he past and if needed, agency will provide services again.

## 2017-03-23 NOTE — Consult Note (Signed)
ORTHOPAEDIC CONSULTATION  REQUESTING PHYSICIAN: Loletha Grayer, MD  Chief Complaint: Hip pain and right wrist drop  HPI: Lindsay [name] is a 61 y.o. female with multiple medical comorbidities including chronic pain. Patient had presented to the ER after a fall out of her wheelchair.  Patient states she has been having many recent falls. She was found to be anemic hypotensive and was admitted to the hospitalist service. At the time of admission she was also complaining of right hip pain. Patient normally receives her care through [facility]. They accepted the patient but did not have a bed available. The patient was therefore admitted to the [facility].  Reviewing her chart the patient had a right hip MRI on April 19th. This showed avascular necrosis of the right femoral head without subchondral collapse. There is suspicion of a possible nondisplaced fracture of the right femoral neck.  There was gluteus and abductor muscle edema suggesting muscle strain.  Patient today states that she was told by her primary care physician that she has a hip fracture that needs immediate attention.  During my examination the patient states she is not currently having any right hip pain.  Orthopedics is also consult it because of the patient's observed right wrist drop. Patient states that her right wrist drop developed spontaneously she estimates approximately 2 weeks ago.  She is also complaining of decreased sensation in all 4 extremities particularly in her hands and feet.  A right brachial plexus MRI was also performed at [facility] and 02/23/2017.  This showed no abnormalities in the brachial plexus. Patient was found of a disc herniation at C6-7 impinging on the ventral cervical spinal cord. Degenerative changes were seen in the right shoulder. She was noted to have mild atrophy of the supraspinatus muscle.  Past Medical History:  Diagnosis Date  . Anemia of chronic disease   . C. difficile diarrhea  10/2011  . Cancer ([place])   . Chronic cough   . Chronic systolic CHF (congestive heart failure) ([place])    a. echo 05/2015: EF 45-50%, nl wall motion, mod MR, nl PASP  . Depression   . Depression   . Fibromyalgia   . History of DVT (deep vein thrombosis)    a. s/p IVC filter 2014  . HX: breast cancer    a. right-side; b. s/p lumpectomy & chemo w/ Docetaxol, cytoxan, adriamycin  . Kidney stones 2013  . Malnutrition ([place])    a. chronic diarrhea s/p most meals   . Moderate mitral regurgitation   . Muscle spasm   . NICM (nonischemic cardiomyopathy) ([place])    a. Lexiscan 08/2015: no sig ischemia, no ECG changes concerning for ischemia (baseline abnormal T wave along anterolateral leads), EF 61%, low risk scan  . Orthostatic hypotension   . PAF (paroxysmal atrial fibrillation) ([place])    a. not on long term, full-dose anticoagulation since 2011, had high INR at that time 2/2 possible malnutition, has been off anticoagulation since; b. CHADS2VASc at least 2 (CHF, female); c. 48 hr Holter 09/2015 NSR w/ rare PACs, no significant PVCs, periods of sinus tachycarida with average heart rate typically in the 60-100 range. No sig arrhythmia. No Afib seen.   . 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 Postural hypotension   . S/P gastric bypass   . Vitamin D deficiency    Past Surgical History:  Procedure Laterality Date  . ABDOMINAL HYSTERECTOMY    . BREAST LUMPECTOMY     right @  [facility]  . ESOPHAGOGASTRODUODENOSCOPY (EGD) WITH PROPOFOL N/A 05/25/2015   Procedure: ESOPHAGOGASTRODUODENOSCOPY (EGD) WITH PROPOFOL;  Surgeon: Manya Silvas, MD;  Location: [facility] ENDOSCOPY;  Service: Endoscopy;  Laterality: N/A;  . LITHOTRIPSY  jan 2013   Social History   Social History  . Marital status: Widowed    Spouse name: N/A  . Number of children: N/A  . Years of education: N/A   Social History Main Topics  . Smoking status: Former Smoker    Packs/day: 0.25    Years: 1.00    Types: Cigarettes     Quit date: 11/10/1985  . Smokeless tobacco: Never Used  . Alcohol use No  . Drug use: No  . Sexual activity: Not Asked   Other Topics Concern  . None   Social History Narrative  . None   Family History  Problem Relation Age of Onset  . CAD Father        MI  . Diabetes Mellitus II Father   . Hypertension Father   . Diabetes Mellitus II Sister    Allergies  Allergen Reactions  . Shrimp [Shellfish Allergy] Anaphylaxis   Prior to Admission medications   Medication Sig Start Date End Date Taking? Authorizing Provider  anastrozole (ARIMIDEX) 1 MG tablet Take 1 mg by mouth daily.     Yes [provider]  aspirin EC 81 MG tablet Take 81 mg by mouth daily.   Yes [provider]  Cholecalciferol (VITAMIN D3) 1000 units CAPS Take 1,000 Units by mouth daily.   Yes [provider]  clonazePAM (KLONOPIN) 0.5 MG tablet Take 0.5 mg by mouth at bedtime as needed for anxiety.    Yes [provider]  flecainide (TAMBOCOR) 100 MG tablet Take 0.5 tablets (50 mg total) by mouth every 12 (twelve) hours. 08/27/15  Yes Fritzi Mandes, MD  fludrocortisone (FLORINEF) 0.1 MG tablet Take 0.2 mg by mouth daily.   Yes [provider]  fluticasone (FLONASE) 50 MCG/ACT nasal spray Place 1 spray into the nose daily.    Yes [provider]  lidocaine (LIDODERM) 5 % Place 1-3 patches onto the skin daily. Remove & Discard patch within 12 hours or as directed by MD    Yes [provider]  loperamide (IMODIUM) 2 MG capsule Take 2 mg by mouth 4 (four) times daily as needed for diarrhea or loose stools.   Yes [provider]  loratadine (CLARITIN) 10 MG tablet Take 10 mg by mouth daily as needed for allergies.    Yes [provider]  midodrine (PROAMATINE) 5 MG tablet Take 5-10 mg by mouth 3 (three) times daily with meals. Take 10 mg in the morning, 5 mg mid day and 5 mg at night.   Yes [provider]  mirtazapine (REMERON) 15 MG  tablet Take 15 mg by mouth at bedtime.     Yes [provider]  morphine (MS CONTIN) 15 MG 12 hr tablet Take 1 tablet (15 mg total) by mouth every 8 (eight) hours. 05/30/16  Yes Vaughan Basta, MD  omeprazole (PRILOSEC) 40 MG capsule Take 40 mg by mouth daily.   Yes [provider]  oxyCODONE (ROXICODONE) 15 MG immediate release tablet Take 15 mg by mouth every 6 (six) hours as needed for pain.   Yes [provider]  potassium chloride (K-DUR) 10 MEQ tablet Take 20 mEq by mouth 2 (two) times daily.   Yes [provider]  pregabalin (LYRICA) 150 MG capsule Take 150 mg  by mouth 3 (three) times daily.   Yes [provider]  sertraline (ZOLOFT) 100 MG tablet Take 150 mg by mouth daily.    Yes [provider]  spironolactone (ALDACTONE) 25 MG tablet Take 25 mg by mouth daily.   Yes [provider]  tiZANidine (ZANAFLEX) 4 MG tablet Take 4 mg by mouth 3 (three) times daily.    Yes [provider]  triamcinolone cream (KENALOG) 0.1 % Apply 1 application topically 2 (two) times daily.   Yes [provider]  vitamin B-12 (CYANOCOBALAMIN) 1000 MCG tablet Take 1,000 mcg by mouth daily.    Yes [provider]  vitamin C (ASCORBIC ACID) 500 MG tablet Take 500 mg by mouth daily.   Yes [provider]   Dg Chest 1 View  Result Date: 03/22/2017 CLINICAL DATA:  Fall. EXAM: CHEST 1 VIEW COMPARISON:  11/18/2016 FINDINGS: Normal heart size and mediastinal contours. No acute infiltrate or edema. No effusion or pneumothorax. No acute osseous findings. Postoperative right axilla. IMPRESSION: No active disease. Electronically Signed   By: Monte Fantasia M.D.   On: 03/22/2017 15:25   Ct Head Wo Contrast  Result Date: 03/22/2017 CLINICAL DATA:  61 year old female status post syncope, fall from commode. EXAM: CT HEAD WITHOUT CONTRAST TECHNIQUE: Contiguous axial images were obtained from the base of the skull through  the vertex without intravenous contrast. COMPARISON:  Brain MRI 01/06/2017 and earlier. FINDINGS: Brain: Stable cerebral volume. No midline shift, ventriculomegaly, mass effect, evidence of mass lesion, intracranial hemorrhage or evidence of cortically based acute infarction. No cortical encephalomalacia. Most of the nonspecific cerebral white matter signal changes seen by MRI are occult by CT. There is mild subcortical white matter heterogeneity evident. Likewise, the chronic pontine abnormality on MRI is largely an apparent by CT. Vascular: Calcified atherosclerosis at the skull base. Skull: Stable and negative. No acute osseous abnormality identified. Sinuses/Orbits: Visualized paranasal sinuses and mastoids are stable and well pneumatized. Other: Stable and negative orbit and scalp soft tissues. IMPRESSION: 1. No acute intracranial abnormality. No acute traumatic injury identified. 2. Stable non contrast CT appearance of the brain. Little of the chronic cerebral white matter and pontine abnormality seen by MR is evident by CT. Electronically Signed   By: Genevie Ann M.D.   On: 03/22/2017 15:26   Ct Cervical Spine Wo Contrast  Result Date: 03/22/2017 CLINICAL DATA:  Pain following fall EXAM: CT CERVICAL SPINE WITHOUT CONTRAST TECHNIQUE: Multidetector CT imaging of the cervical spine was performed without intravenous contrast. Multiplanar CT image reconstructions were also generated. COMPARISON:  None. FINDINGS: Alignment: There is no evidence spondylolisthesis. Skull base and vertebrae: Skullbase and craniocervical junction regions appear normal. There is no evident fracture. There are no blastic or lytic bone lesions. Soft tissues and spinal canal: Prevertebral soft tissues and predental space regions are normal. There are no paraspinous lesions. There is no evident cord or canal hematoma. Disc levels: There is moderate disc space narrowing at C6-7. Other disc spaces appear unremarkable. There is a focal  anterior osteophyte along the inferior aspect of C6. There is mild facet osteoarthritic change at several levels bilaterally. No nerve root edema or effacement. No disc extrusion or stenosis. Upper chest: Visualized upper lung regions are clear. Other: There are foci of carotid artery calcification bilaterally. IMPRESSION: No fracture or spondylolisthesis. Osteoarthritic change at C6-7. No disc extrusion or stenosis evident. There are foci of carotid artery calcification bilaterally. Electronically Signed   By: Lowella Grip III M.D.  On: 03/22/2017 15:07   Dg Hip Unilat With Pelvis 2-3 Views Right  Result Date: 03/23/2017 CLINICAL DATA:  Hip pain.  Fall. EXAM: DG HIP (WITH OR WITHOUT PELVIS) 2-3V RIGHT COMPARISON:  None. FINDINGS: Degenerative changes with significant loss of joint space. No fracture. No dislocation. IMPRESSION: Degenerative changes with loss of joint space. No fracture or malalignment. Electronically Signed   By: Dorise Bullion III M.D   On: 03/23/2017 18:20    Positive ROS: All other systems have been reviewed and were otherwise negative with the exception of those mentioned in the HPI and as above.  Physical Exam: General: Alert, no acute distress  MUSCULOSKELETAL:   Right hip: Patient had no shortening or external rotation to the right lower extremity. She can dorsiflex and plantarflex her ankle without detectable weakness. She could flex her knee and hip to approximately 45 today without pain. She had no pain with logrolling of the right hip. She states she has decreased sensation to light touch in both feet and lower legs. She had palpable posterior tibialis pulses bilaterally.   right wrist:  Patient has contraction of the flexor muscles of her forearm and can actively flex her wrist. She has flexor function to her fingers but has no active digital or wrist extension. She can passively have her wrist and fingers extended. Her fingers are well-perfused. She has  decreased sensation to light touch in her right hand. She has a palpable radial pulse.  Assessment: #1  AVN of the right hip #2  right wrist extensor weakness  Plan: I reviewed plain x-rays of the right hip and pelvis today which do not demonstrate any evidence of fracture. There is no callus formation to suggest a healing femoral neck hip fracture. There is no femoral head collapse despite previous MRI evidence of AVN. Patient is not having pain today with logrolling or hip flexion and extension.  An MRI of the right hip has been ordered. I recommend this MRI to reevaluate for possible occult femoral neck hip fracture. However a month has passed since the patient had her initial MRI questioning fracture. It is likely that the hip fracture, if present, is already partially healed and may not require surgical intervention. There is no acute intervention necessary for AVN of the right hip.  Patient's plain x-ray of the right hip on 03/06/2017 at [facility] showed significant joint space narrowing, which had progressed compared to her prior x-ray in October 2017, but no evidence of hip fracture.  X-ray films of the pelvis and hip today also show significant joint space narrowing without fracture.  Patient should have a physical therapy evaluation after her right hip MRI if no fracture of the femoral neck is confirmed.  Given that the patient had an MRI of the brachial plexus on 02/23/2017, I'm assuming her wrist drop is at least 13 weeks old.  The MRI was ordered with a question of possible neuropathy due to chemotherapy.  Now that the patient is at least 4 weeks out or more from her symptoms, I recommend further evaluation by neurology. An EMG/nerve conduction study may be of value in further workup of her wrist drop.  The MRI of the brachial plexus at [facility] suggested a disc herniation at C6-7 and a cervical MRI follow-up was recommended. Cervical MRI will be ordered to assist with a neurologic workup. I recommended  a right wrist splint for this patient to avoid flexion contracture. OT has been consult did for evaluation and splint recommendation.  Thornton Park, MD    03/23/2017 7:53 PM

## 2017-03-23 NOTE — Care Management Obs Status (Signed)
[place] NOTIFICATION   Patient Details  Name: Lindsay [name] MRN: [id number] Date of Birth: [birth year]   Medicare Observation Status Notification Given:  Yes Given and explained.  Declined to sign   Katrina Stack, RN 03/23/2017, 10:26 AM

## 2017-03-24 ENCOUNTER — Inpatient Hospital Stay: Payer: Medicare Other

## 2017-03-24 DIAGNOSIS — M21331 Wrist drop, right wrist: Secondary | ICD-10-CM

## 2017-03-24 LAB — CBC
HCT: 27.3 % — ABNORMAL LOW (ref 35.0–47.0)
Hemoglobin: 9 g/dL — ABNORMAL LOW (ref 12.0–16.0)
MCH: 31.7 pg (ref 26.0–34.0)
MCHC: 33.1 g/dL (ref 32.0–36.0)
MCV: 95.7 fL (ref 80.0–100.0)
PLATELETS: 144 10*3/uL — AB (ref 150–440)
RBC: 2.85 MIL/uL — AB (ref 3.80–5.20)
RDW: 17.7 % — ABNORMAL HIGH (ref 11.5–14.5)
WBC: 10.6 10*3/uL (ref 3.6–11.0)

## 2017-03-24 LAB — BASIC METABOLIC PANEL
ANION GAP: 7 (ref 5–15)
BUN: 22 mg/dL — AB (ref 6–20)
CALCIUM: 8.1 mg/dL — AB (ref 8.9–10.3)
CO2: 22 mmol/L (ref 22–32)
Chloride: 113 mmol/L — ABNORMAL HIGH (ref 101–111)
Creatinine, Ser: 1.21 mg/dL — ABNORMAL HIGH (ref 0.44–1.00)
GFR calc Af Amer: 56 mL/min — ABNORMAL LOW (ref >60)
GFR, EST NON AFRICAN AMERICAN: 48 mL/min — AB (ref >60)
Glucose, Bld: 88 mg/dL (ref 65–99)
POTASSIUM: 3.3 mmol/L — AB (ref 3.5–5.1)
SODIUM: 142 mmol/L (ref 135–145)

## 2017-03-24 LAB — HAPTOGLOBIN: HAPTOGLOBIN: 51 mg/dL (ref 34–200)

## 2017-03-24 LAB — GLUCOSE, CAPILLARY: Glucose-Capillary: 142 mg/dL — ABNORMAL HIGH (ref 65–99)

## 2017-03-24 MED ORDER — FLUDROCORTISONE ACETATE 0.1 MG PO TABS
0.2000 mg | ORAL_TABLET | Freq: Two times a day (BID) | ORAL | Status: DC
Start: 2017-03-24 — End: 2017-04-02
  Administered 2017-03-24 – 2017-04-02 (×16): 0.2 mg via ORAL
  Filled 2017-03-24 (×18): qty 2

## 2017-03-24 MED ORDER — POTASSIUM CHLORIDE CRYS ER 20 MEQ PO TBCR
20.0000 meq | EXTENDED_RELEASE_TABLET | Freq: Once | ORAL | Status: AC
  Administered 2017-03-24: 20 meq via ORAL
  Filled 2017-03-24: qty 1

## 2017-03-24 MED ORDER — LORAZEPAM 2 MG/ML IJ SOLN
0.5000 mg | Freq: Once | INTRAMUSCULAR | Status: AC
  Administered 2017-03-24: 0.5 mg via INTRAVENOUS
  Filled 2017-03-24: qty 1

## 2017-03-24 MED ORDER — POTASSIUM CHLORIDE CRYS ER 20 MEQ PO TBCR
20.0000 meq | EXTENDED_RELEASE_TABLET | Freq: Two times a day (BID) | ORAL | Status: DC
  Administered 2017-03-24 – 2017-04-02 (×15): 20 meq via ORAL
  Filled 2017-03-24 (×19): qty 1

## 2017-03-24 NOTE — Progress Notes (Signed)
Palliative Medicine Team  Due to high volume of referrals, there is a delay seeing this patient. PMT not at [facility] over the weekend but will arrange goals of care with patient and family on Monday. Thank you for the opportunity to participate in the care of Ms. Lindsay [name].   Ihor Dow, FNP-C Palliative Medicine Team  Phone: [phone] Fax: [phone]

## 2017-03-24 NOTE — Evaluation (Signed)
Occupational Therapy Evaluation Patient Details Name: Lindsay [name] MRN: [id number] DOB: 1956-08-03 Today's Date: 03/24/2017    History of Present Illness Lindsay [name] is a 61 y.o. female with a known history of multiple medical problems including chronic atrial fibrillation, fibromyalgia, chronic pain syndrome and had a syncopal episode in the bathroom falling out of her wheelchair and she called 911.  EMS brought her and she requested to go to [facility] and has been accepted,  but they don't have beds for next 24 hours. She is agreeable to stay here. She complains of weakness for the past 2 weeks getting progressive worse with recurrent  falls.  She states that she hit her forehead and face on the floor when she fell. Patient has no use of her right arm from before. Patient has history of avascular necrosis of the  Right hip and the patient is followed by the [facility] and is in the process of getting operated for that. Because of multiple medical problems patient will be transferred to [facility] when she has a bed. Meanwhile she'll be here for syncope workup, [facility]  recommended orthopedic consult.  She has had low BP which is being monitored closely.  Pt also has disc herniation at C6-7.   Clinical Impression   Pt is 61 year old woman with above diagnosis with multiple medical problems including R wrist drip and an order for R wrist splint which is already in place.  Placement assessed and skin was checked with mild redness in R wib space and Lindsay [name] from Lindsay [name] updated.  Pt has a glove in place to help keep hand warm stating she had had cold hands for a long time from neuropathy.  Pt was seen for light ADLs for grooming and use of foam utensil holder and self feeding only due to very low BP which was monitored and remained stable during session 72/60.  Pt was able to use foam utensil holder with R hand splint in place to complete self feeding and grooming with set up and minimal cues which was a progression from  max assist per NSG and pt report.  Pt is having an MRI for R hip with ortho consult so no OOB or rolling tasks completed this session.  Pt would benefit from instruction in AD for LB dressing once Ortho decides a plan of treatment and assess for surgery needs. She would benefit from continued skilled OT services for education in assistive devices, functional mobility when cleared, and education in recommendations for home modifications to increase safety and prevent falls. Not clear as to how much assist patient has at home and will discuss with SW/care management.  Pt is a good candidate for SNF to continue rehabilitation.      Follow Up Recommendations  SNF    Equipment Recommendations  Other (comment) (foam utensil holder and AD for LB dressing)    Recommendations for Other Services       Precautions / Restrictions Precautions Precautions: Fall;Other (comment) (R hip avascular necrosis--awaiting ortho consult ) Precaution Comments: monitor low BP---no OOB tasks until cleared by ortho  Restrictions Weight Bearing Restrictions: Yes Other Position/Activity Restrictions: no OOB until cleared by Ortho      Mobility Bed Mobility                  Transfers                      Balance  ADL either performed or assessed with clinical judgement   ADL Overall ADL's : Needs assistance/impaired Eating/Feeding: Maximal assistance;Set up Eating/Feeding Details (indicate cue type and reason): pt neeed max assist for self feeding and only set up once she was educated in use of foam utensil holder and NSG updated. Grooming: Wash/dry hands;Wash/dry face;Oral care;Set up;Minimal assistance Grooming Details (indicate cue type and reason): using foam utensil holder Upper Body Bathing: Set up;Minimal assistance   Lower Body Bathing: Total assistance Lower Body Bathing Details (indicate cue type and reason): total assist  for LB Dressng and bathing due to Ortho consult in place for R hip avascular necrosis  Upper Body Dressing : Set up;Moderate assistance   Lower Body Dressing: Total assistance Lower Body Dressing Details (indicate cue type and reason): total assist for LB Dressng and bathing due to Ortho consult in place for R hip avascular necrosis                      Vision Patient Visual Report: No change from baseline       Perception     Praxis      Pertinent Vitals/Pain Pain Assessment: No/denies pain     Hand Dominance Right   Extremity/Trunk Assessment Upper Extremity Assessment Upper Extremity Assessment: Generalized weakness RUE Deficits / Details: Pt with R wrist drop and has a wrist cock up splint in place with active movement in R hand flexion and minimal extension with pain from neuropathy.  She is able to flex and abduct RUE for self feeding and grooming but presents with weakness for overhead tasks and minimal use of R hand.  She is able to use foam utensil holder to feed self and groom self.   RUE Sensation: decreased light touch;history of peripheral neuropathy RUE Coordination: decreased fine motor   Lower Extremity Assessment Lower Extremity Assessment: Defer to PT evaluation       Communication Communication Communication: No difficulties   Cognition Arousal/Alertness: Awake/alert Behavior During Therapy: WFL for tasks assessed/performed Overall Cognitive Status: Within Functional Limits for tasks assessed                                     General Comments       Exercises     Shoulder Instructions      Home Living Family/patient expects to be discharged to:: Private residence Living Arrangements: Alone Available Help at Discharge: Neighbor;Personal care attendant Type of Home: Apartment Home Access: Level entry     Home Layout: One level     Bathroom Shower/Tub: Teacher, early years/pre: Standard     Home  Equipment: Environmental consultant - 4 wheels;Walker - 2 wheels;Wheelchair - Education officer, community - power;Cane - single point          Prior Functioning/Environment Level of Independence: Needs assistance    ADL's / Homemaking Assistance Needed: Pt. has daily personal care aides to assist with ADLs.    Comments: transportation by aides        OT Problem List: Decreased safety awareness;Decreased strength;Decreased range of motion;Decreased activity tolerance;Impaired UE functional use      OT Treatment/Interventions: Self-care/ADL training;DME and/or AE instruction;Therapeutic activities;Energy conservation;Balance training;Patient/family education    OT Goals(Current goals can be found in the care plan section) Acute Rehab OT Goals Patient Stated Goal: "i am just so overwhelmed by all of this and just want to get better" OT Goal Formulation:  With patient Time For Goal Achievement: 04/07/17 Potential to Achieve Goals: Good  OT Frequency: Min 1X/week   Barriers to D/C: Decreased caregiver support          Co-evaluation              AM-PAC PT "6 Clicks" Daily Activity     Outcome Measure Help from another person eating meals?: A Lot Help from another person taking care of personal grooming?: A Little Help from another person toileting, which includes using toliet, bedpan, or urinal?: Total Help from another person bathing (including washing, rinsing, drying)?: A Lot Help from another person to put on and taking off regular upper body clothing?: A Little Help from another person to put on and taking off regular lower body clothing?: Total 6 Click Score: 12   End of Session Equipment Utilized During Treatment: Other (comment) (foam utensil holder) Nurse Communication: Other (comment) (foam utensil holder for feeding and monitor web space of R hand where splint strap is)  Activity Tolerance: Patient limited by fatigue Patient left: in bed;with call bell/phone within reach;with bed alarm  set                   Time: 0930-1015 OT Time Calculation (min): 45 min Charges:  OT General Charges $OT Visit: 1 Procedure OT Evaluation $OT Eval Moderate Complexity: 1 Procedure OT Treatments $Self Care/Home Management : 8-22 mins $Therapeutic Activity: 8-22 mins G-Codes:     Chrys Racer, OTR/L ascom [phone] 03/24/17, 10:38 AM

## 2017-03-24 NOTE — Plan of Care (Signed)
Problem: Pain Managment: Goal: General experience of comfort will improve Outcome: Progressing Better pain control and slept at longer intervals this shift.

## 2017-03-24 NOTE — Progress Notes (Addendum)
PT Cancellation Note  Patient Details Name: Lindsay [name] MRN: [id number] DOB: Oct 12, 1956   Cancelled Treatment:    Reason Eval/Treat Not Completed: Other (comment). Reviewed chart. Ortho MD ordered hip MRI to check for fracture. Pt also with decreased BP at this time (66/55). Pt is not appropriate for PT at this time. Will hold consult at this time until medically clear. Possible transfer to [facility] for further workup.   Gwenna Fuston 03/24/2017, 8:29 AM  Greggory Stallion, PT, DPT [phone]

## 2017-03-24 NOTE — Progress Notes (Signed)
Patient ID: Lindsay [name], female   DOB: 1955/11/21, 61 y.o.   MRN: [id number]  [employer] PROGRESS NOTE  Luv Mish HYI:[id number] DOB: 02-20-1956 DOA: 03/22/2017 PCP: System, Pcp Not In  HPI/Subjective: Patient feeling better with regards to the pain that she's having. Still having pain in her right hip. Still having right wrist drop. Could not get a blood pressure while she was standing. Severe drop with sitting.  Objective: Vitals:   03/24/17 0821 03/24/17 0827  BP: 98/62 (!) 66/55  Pulse: 65 75  Resp: 12   Temp: 98.1 F (36.7 C)     Filed Weights   03/22/17 1346 03/23/17 0313 03/24/17 0604  Weight: 40.8 kg (90 lb) 50.8 kg (112 lb) 52.3 kg (115 lb 6.4 oz)    ROS: Review of Systems  Constitutional: Negative for chills and fever.  Eyes: Negative for blurred vision.  Respiratory: Negative for cough and shortness of breath.   Cardiovascular: Negative for chest pain.  Gastrointestinal: Negative for abdominal pain, constipation, diarrhea, nausea and vomiting.  Genitourinary: Negative for dysuria.  Musculoskeletal: Positive for joint pain and myalgias.  Neurological: Negative for dizziness and headaches.   Exam: Physical Exam  HENT:  Nose: No mucosal edema.  Mouth/Throat: No oropharyngeal exudate.  Eyes: Conjunctivae, EOM and lids are normal. Pupils are equal, round, and reactive to light.  Neck: Trachea normal. Carotid bruit is not present. No thyromegaly present.  Cardiovascular: Regular rhythm, S1 normal, S2 normal and normal heart sounds.   Respiratory: She has no decreased breath sounds. She has no wheezes. She has no rhonchi.  GI: Soft. Bowel sounds are normal. There is no tenderness.  Musculoskeletal:       Right hip: She exhibits decreased range of motion and tenderness.       Right ankle: She exhibits no swelling.       Left ankle: She exhibits no swelling.  Neurological: She is alert.  Right wrist drop. Patient able to flex and extend at bilateral  ankles. Patient able to lift her legs up off the bed. Painful range of motion right hip.  Skin: Skin is warm. No rash noted.  Patient does have some bruises and cuts on her lower extremities.  Psychiatric: She has a normal mood and affect.  Patient yelling out in pain      Data Reviewed: Basic Metabolic Panel:  Recent Labs Lab 03/22/17 1421 03/23/17 0552 03/24/17 0503  NA 145 143 142  K 3.5 3.1* 3.3*  CL 115* 114* 113*  CO2 23 23 22   GLUCOSE 78 110* 88  BUN 31* 23* 22*  CREATININE 1.25* 1.21* 1.21*  CALCIUM 8.1* 7.5* 8.1*   Liver Function Tests:  Recent Labs Lab 03/22/17 1421  AST 27  ALT 16  ALKPHOS 114  BILITOT 0.8  PROT 4.7*  ALBUMIN 2.1*   CBC:  Recent Labs Lab 03/22/17 1421 03/23/17 0552 03/24/17 0503  WBC 8.1 9.1 10.6  NEUTROABS 6.3  --   --   HGB 7.0* 8.6* 9.0*  HCT 21.4* 25.6* 27.3*  MCV 100.0 96.6 95.7  PLT 162 142* 144*   Cardiac Enzymes:  Recent Labs Lab 03/22/17 1421 03/23/17 1158  CKTOTAL 80 56  TROPONINI <0.03  --     CBG:  Recent Labs Lab 03/23/17 0753 03/24/17 0740  GLUCAP 97 142*     Studies: Mr Cervical Spine Wo Contrast  Result Date: 03/24/2017 CLINICAL DATA:  Right wrist drop. Decreased sensation in hands and feet. History of disc herniation at C6-7.  Recent brachial plexus MRI at [facility], 02/23/2017, reportedly negative. EXAM: MRI CERVICAL SPINE WITHOUT CONTRAST TECHNIQUE: Multiplanar, multisequence MR imaging of the cervical spine was performed. No intravenous contrast was administered. COMPARISON:  None. FINDINGS: Alignment: Physiologic. Vertebrae: No fracture, evidence of discitis, or bone lesion. Cord: There is an unexpected rounded T2 hyperintensity in the dorsal midline cord, which appears somewhat thinned. This central, singular, rounded appearance is not typical of subacute combined degeneration, but would consider B12 and copper deficiency in this patient with history of mouth nutrition and gastric bypass surgery. No  associated expansion to suggest this is acute demyelination or infectious myelitis. There is no historical indication of HIV. Posterior Fossa, vertebral arteries, paraspinal tissues: No acute finding. Lacune in the pons which was also seen on prior MRI. Disc levels: C5-6 disc narrowing and broad central protrusion which contacts but does not compress the cord. No foraminal impingement to explain right arm symptoms. IMPRESSION: 1. Unexpected dorsal cord signal throughout the cervical levels. The appearance is not typical for subacute combined degeneration, but would consider T12 and copper deficiency in this patient with history of malnutrition and gastric bypass surgery. Please see further discussion above. 2. C6-7 disc protrusion with mild spinal stenosis. No foraminal impingement to explain right arm symptoms. Electronically Signed   By: Monte Fantasia M.D.   On: 03/24/2017 13:29   Mr Hip Right Wo Contrast  Result Date: 03/24/2017 CLINICAL DATA:  Presented to the ER after a fall out of her wheelchair. Patient states she has been having many recent falls. She was found to be anemic hypotensive and was admitted to the hospitalist service. At the time of admission she was also complaining of right hip pain. Patient normally receives her [facility] through [facility]. Patient today states that she was told by her primary care physician that she has a hip fracture that needs immediate attention. During my examination the patient states she is not currently having any right hip pain. EXAM: MR OF THE RIGHT HIP WITHOUT CONTRAST TECHNIQUE: Multiplanar, multisequence MR imaging was performed. No intravenous contrast was administered. COMPARISON:  None. FINDINGS: Bones: No left hip fracture, dislocation or avascular necrosis. No right hip fracture or dislocation. Subchondral serpiginous signal abnormality in the superior right femoral head with severe surrounding marrow edema most consistent with avascular necrosis. No articular  surface collapse. No aggressive osseous lesion. Normal sacrum and sacroiliac joints. No SI joint widening or erosive changes. Moderate bilateral facet arthropathy at L5-S1. Articular cartilage and labrum Articular cartilage: High-grade partial-thickness cartilage loss with areas of full-thickness cartilage loss of the right femoral head and acetabulum. Mild partial-thickness cartilage loss of the left hip. Labrum: Grossly intact, but evaluation is limited by lack of intraarticular fluid. Joint or bursal effusion Joint effusion: Large right hip joint effusion. No left hip joint effusion. No SI joint effusion. Bursae:  No bursa formation. Muscles and tendons Flexors: Normal. Extensors: Normal. Abductors: Normal. Adductors: Normal. Gluteals: Normal. Hamstrings: Normal. Other findings Miscellaneous: No pelvic free fluid. No fluid collection or hematoma. No inguinal lymphadenopathy. No inguinal hernia. IMPRESSION: 1. Avascular necrosis of the right femoral head with severe surrounding marrow edema involving the femoral head and neck and a large joint effusion. No articular surface collapse. 2. 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 3. Mild osteoarthritis of the left hip. 4. No hip fracture or dislocation. Electronically Signed   By: Kathreen Devoid   On: 03/24/2017 13:14   Dg Hip Unilat With Pelvis 2-3 Views Right  Result Date: 03/23/2017  CLINICAL DATA:  Hip pain.  Fall. EXAM: DG HIP (WITH OR WITHOUT PELVIS) 2-3V RIGHT COMPARISON:  None. FINDINGS: Degenerative changes with significant loss of joint space. No fracture. No dislocation. IMPRESSION: Degenerative changes with loss of joint space. No fracture or malalignment. Electronically Signed   By: Dorise Bullion III M.D   On: 03/23/2017 18:20    Scheduled Meds: . anastrozole  1 mg Oral Daily  . aspirin EC  81 mg Oral Daily  . cholecalciferol  1,000 Units Oral Daily  . docusate sodium  100 mg Oral BID  . enoxaparin (LOVENOX) injection  40 mg  Subcutaneous Q24H  . flecainide  50 mg Oral Q12H  . fludrocortisone  0.2 mg Oral BID  . fluticasone  1 spray Each Nare Daily  . midodrine  10 mg Oral TID WC  . mirtazapine  15 mg Oral QHS  . morphine  15 mg Oral Q8H  . pantoprazole  40 mg Oral Daily  . potassium chloride  20 mEq Oral BID  . pregabalin  150 mg Oral TID  . sertraline  150 mg Oral QHS  . vitamin B-12  1,000 mcg Oral Daily  . vitamin C  500 mg Oral Daily   Continuous Infusions: . cefTRIAXone (ROCEPHIN)  IV Stopped (03/24/17 0904)    Assessment/Plan:  1. Severe orthostatic hypotension. Increased midodrine to 10 mg 3 times a day. Increase Florinef to 0.2 mg twice a day.  I discontinued Zanaflex. Physical therapy evaluation. TED hose prescribed 2. Severe anemia. Transfuse 1 unit of packed red blood cells already. Ferritin 129. 3. Fever. Could be urine source. Awaiting urine culture. Empiric Rocephin. 4. Avascular necrosis of right hip with joint effusion. Appreciate orthopedic consultation. Pain control. 5. Right wrist drop. Will need EMG as outpatient. Seen by neurology. 6. History of breast cancer on anastrozole 7. Neuropathy on Lyrica 8. Depression on Zoloft 9. Severe malnutrition 10. History of congestive heart failure but no signs currently 11. Atrial fibrillation on flecainide 12. Weakness. Physical therapy evaluation  Code Status:     Code Status Orders        Start     Ordered   03/22/17 1611  Full code  Continuous     03/22/17 1612    Code Status History    Date Active Date Inactive Code Status Order ID Comments User Context   01/05/2017  4:48 PM 01/06/2017  9:37 PM Full Code [id number]  Demetrios Loll, MD Inpatient   05/29/2016  4:24 PM 05/30/2016  8:01 AM Full Code [id number]  Idelle Crouch, MD Inpatient   08/23/2015  4:14 PM 08/27/2015  7:54 PM Full Code [id number]  Fritzi Mandes, MD ED   05/23/2015 11:00 AM 05/26/2015  3:23 PM Full Code [id number]  Henreitta Leber, MD Inpatient     Disposition Plan: To be  determined. I don't think that she is safe to go home by herself.  Consultants:  Palliative care  Orthopedic surgery  Physical therapy  Antibiotics:  Rocephin  Time spent: 26 minutes  [place]

## 2017-03-24 NOTE — Consult Note (Signed)
Reason for Consult:Wrist drop Referring Physician: Leslye Peer  CC: Wrist drop  HPI: Lindsay [name] is an 61 y.o. female with a two month history of wrist drop.  Patient seen on March 1 with the same complaint.  Was admitted at that time.  MRI of the brain was performed and showed no acute changes.  Patient also had a cervical spine CT and MRI of the brachial plexus that showed no etiology for the patient;s symptoms.  Patient with a history of chronic sensorimotor neuropathy.  Symptoms were felt to be secondary to neuropathy and patient was to see neurology on an outpatient basis for NCV/EMG testing.   She reports no improvement since March and has not seen the neurologist.    Past Medical History:  Diagnosis Date  . Anemia of chronic disease   . C. difficile diarrhea 10/2011  . Cancer ([place])   . Chronic cough   . Chronic systolic CHF (congestive heart failure) ([place])    a. echo 05/2015: EF 45-50%, nl wall motion, mod MR, nl PASP  . Depression   . Depression   . Fibromyalgia   . History of DVT (deep vein thrombosis)    a. s/p IVC filter 2014  . HX: breast cancer    a. right-side; b. s/p lumpectomy & chemo w/ Docetaxol, cytoxan, adriamycin  . Kidney stones 2013  . Malnutrition ([place])    a. chronic diarrhea s/p most meals   . Moderate mitral regurgitation   . Muscle spasm   . NICM (nonischemic cardiomyopathy) ([place])    a. Lexiscan 08/2015: no sig ischemia, no ECG changes concerning for ischemia (baseline abnormal T wave along anterolateral leads), EF 61%, low risk scan  . Orthostatic hypotension   . PAF (paroxysmal atrial fibrillation) ([place])    a. not on long term, full-dose anticoagulation since 2011, had high INR at that time 2/2 possible malnutition, has been off anticoagulation since; b. CHADS2VASc at least 2 (CHF, female); c. 48 hr Holter 09/2015 NSR w/ rare PACs, no significant PVCs, periods of sinus tachycarida with average heart rate typically in the 60-100 range. No sig arrhythmia. No  Afib seen.   . 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 Postural hypotension   . S/P gastric bypass   . Vitamin D deficiency     Past Surgical History:  Procedure Laterality Date  . ABDOMINAL HYSTERECTOMY    . BREAST LUMPECTOMY     right @ [facility]  . ESOPHAGOGASTRODUODENOSCOPY (EGD) WITH PROPOFOL N/A 05/25/2015   Procedure: ESOPHAGOGASTRODUODENOSCOPY (EGD) WITH PROPOFOL;  Surgeon: Manya Silvas, MD;  Location: [facility] ENDOSCOPY;  Service: Endoscopy;  Laterality: N/A;  . LITHOTRIPSY  jan 2013    Family History  Problem Relation Age of Onset  . CAD Father        MI  . Diabetes Mellitus II Father   . Hypertension Father   . Diabetes Mellitus II Sister     Social History:  reports that she quit smoking about 31 years ago. Her smoking use included Cigarettes. She has a 0.25 pack-year smoking history. She has never used smokeless tobacco. She reports that she does not drink alcohol or use drugs.  Allergies  Allergen Reactions  . Shrimp [Shellfish Allergy] Anaphylaxis    Medications:  I have reviewed the patient's current medications. Prior to Admission:  Prescriptions Prior to Admission  Medication Sig Dispense Refill Last Dose  . anastrozole (ARIMIDEX) 1 MG tablet Take 1 mg by mouth daily.  03/21/2017 at 0800  . aspirin EC 81 MG tablet Take 81 mg by mouth daily.   03/21/2017 at 0800  . Cholecalciferol (VITAMIN D3) 1000 units CAPS Take 1,000 Units by mouth daily.   03/21/2017 at 0800  . clonazePAM (KLONOPIN) 0.5 MG tablet Take 0.5 mg by mouth at bedtime as needed for anxiety.    03/21/2017 at 2000  . flecainide (TAMBOCOR) 100 MG tablet Take 0.5 tablets (50 mg total) by mouth every 12 (twelve) hours. 60 tablet 0 03/21/2017 at 2000  . fludrocortisone (FLORINEF) 0.1 MG tablet Take 0.2 mg by mouth daily.   03/21/2017 at 0800  . fluticasone (FLONASE) 50 MCG/ACT nasal spray Place 1 spray into the nose daily.    03/21/2017 at 0800  . lidocaine (LIDODERM) 5 % Place 1-3 patches onto  the skin daily. Remove & Discard patch within 12 hours or as directed by MD    prn at prn  . loperamide (IMODIUM) 2 MG capsule Take 2 mg by mouth 4 (four) times daily as needed for diarrhea or loose stools.   prn at prn  . loratadine (CLARITIN) 10 MG tablet Take 10 mg by mouth daily as needed for allergies.    prn at prn  . midodrine (PROAMATINE) 5 MG tablet Take 5-10 mg by mouth 3 (three) times daily with meals. Take 10 mg in the morning, 5 mg mid day and 5 mg at night.   03/21/2017 at 2000  . mirtazapine (REMERON) 15 MG tablet Take 15 mg by mouth at bedtime.     03/21/2017 at 2000  . morphine (MS CONTIN) 15 MG 12 hr tablet Take 1 tablet (15 mg total) by mouth every 8 (eight) hours. 4 tablet 0 03/21/2017 at Unknown time  . omeprazole (PRILOSEC) 40 MG capsule Take 40 mg by mouth daily.   03/21/2017 at 0800  . oxyCODONE (ROXICODONE) 15 MG immediate release tablet Take 15 mg by mouth every 6 (six) hours as needed for pain.   prn at prn  . potassium chloride (K-DUR) 10 MEQ tablet Take 20 mEq by mouth 2 (two) times daily.   03/21/2017 at 2000  . pregabalin (LYRICA) 150 MG capsule Take 150 mg by mouth 3 (three) times daily.   03/21/2017 at 2000  . sertraline (ZOLOFT) 100 MG tablet Take 150 mg by mouth daily.    03/21/2017 at 2000  . spironolactone (ALDACTONE) 25 MG tablet Take 25 mg by mouth daily.   03/21/2017 at 0800  . tiZANidine (ZANAFLEX) 4 MG tablet Take 4 mg by mouth 3 (three) times daily.    03/21/2017 at 2000  . triamcinolone cream (KENALOG) 0.1 % Apply 1 application topically 2 (two) times daily.   03/21/2017 at 2000  . vitamin B-12 (CYANOCOBALAMIN) 1000 MCG tablet Take 1,000 mcg by mouth daily.    03/21/2017 at 0800  . vitamin C (ASCORBIC ACID) 500 MG tablet Take 500 mg by mouth daily.   03/21/2017 at 0800   Scheduled: . anastrozole  1 mg Oral Daily  . aspirin EC  81 mg Oral Daily  . cholecalciferol  1,000 Units Oral Daily  . docusate sodium  100 mg Oral BID  . enoxaparin (LOVENOX) injection  40 mg  Subcutaneous Q24H  . flecainide  50 mg Oral Q12H  . fludrocortisone  0.2 mg Oral Daily  . fluticasone  1 spray Each Nare Daily  . midodrine  10 mg Oral TID WC  . mirtazapine  15 mg Oral QHS  . morphine  15  mg Oral Q8H  . pantoprazole  40 mg Oral Daily  . potassium chloride  20 mEq Oral BID  . pregabalin  150 mg Oral TID  . sertraline  150 mg Oral QHS  . vitamin B-12  1,000 mcg Oral Daily  . vitamin C  500 mg Oral Daily    ROS: History obtained from the patient  General ROS: fatigue Psychological ROS: negative for - behavioral disorder, hallucinations, memory difficulties, mood swings or suicidal ideation Ophthalmic ROS: negative for - blurry vision, double vision, eye pain or loss of vision ENT ROS: negative for - epistaxis, nasal discharge, oral lesions, sore throat, tinnitus or vertigo Allergy and Immunology ROS: negative for - hives or itchy/watery eyes Hematological and Lymphatic ROS: negative for - bleeding problems, bruising or swollen lymph nodes Endocrine ROS: negative for - galactorrhea, hair pattern changes, polydipsia/polyuria or temperature intolerance Respiratory ROS: negative for - cough, hemoptysis, shortness of breath or wheezing Cardiovascular ROS: negative for - chest pain, dyspnea on exertion, edema or irregular heartbeat Gastrointestinal ROS: negative for - abdominal pain, diarrhea, hematemesis, nausea/vomiting or stool incontinence Genito-Urinary ROS: negative for - dysuria, hematuria, incontinence or urinary frequency/urgency Musculoskeletal ROS: negative for - joint swelling or muscular weakness Neurological ROS: as noted in HPI Dermatological ROS: negative for rash and skin lesion changes  Physical Examination: Blood pressure (!) 66/55, pulse 75, temperature 98.1 F (36.7 C), temperature source Oral, resp. rate 12, height 5\' 6"  (1.676 m), weight 52.3 kg (115 lb 6.4 oz), SpO2 100 %.  Mental Status: Alert, oriented, thought content appropriate.  Speech  fluent without evidence of aphasia.  Able to follow 3 step commands without difficulty. Cranial Nerves: II: Discs flat bilaterally; Visual fields grossly normal, pupils equal, round, reactive to light and accommodation III,IV, VI: ptosis not present, extra-ocular motions intact bilaterally V,VII: face symmetric, facial light touch sensation normal bilaterally VIII: hearing normal bilaterally IX,X: gag reflex present XI: bilateral shoulder shrug XII: midline tongue extension Motor: Right :  Upper extremity   4/5 with significant wrist drop                                             Left:     Upper extremity   5/5             Lower extremity   2+/5                                                                                                Lower extremity   3-/5 Tone and bulk:normal tone throughout; no atrophy noted Sensory: Pinprick and light touch impaired in all extremities, worse on right upper extremity Deep Tendon Reflexes: 1+ in the upper extremities, absent in the lower extremities Plantars: Right: mute                              Left: mute Cerebellar: Normal finger-to-nose Gait: not tested due to safety concerns  Laboratory Studies:   Basic Metabolic Panel:  Recent Labs Lab 03/22/17 1421 03/23/17 0552 03/24/17 0503  NA 145 143 142  K 3.5 3.1* 3.3*  CL 115* 114* 113*  CO2 23 23 22   GLUCOSE 78 110* 88  BUN 31* 23* 22*  CREATININE 1.25* 1.21* 1.21*  CALCIUM 8.1* 7.5* 8.1*    Liver Function Tests:  Recent Labs Lab 03/22/17 1421  AST 27  ALT 16  ALKPHOS 114  BILITOT 0.8  PROT 4.7*  ALBUMIN 2.1*   No results for input(s): LIPASE, AMYLASE in the last 168 hours. No results for input(s): AMMONIA in the last 168 hours.  CBC:  Recent Labs Lab 03/22/17 1421 03/23/17 0552 03/24/17 0503  WBC 8.1 9.1 10.6  NEUTROABS 6.3  --   --   HGB 7.0* 8.6* 9.0*  HCT 21.4* 25.6* 27.3*  MCV 100.0 96.6 95.7  PLT 162 142* 144*    Cardiac Enzymes:  Recent Labs Lab  03/22/17 1421 03/23/17 1158  CKTOTAL 80 56  TROPONINI <0.03  --     BNP: Invalid input(s): POCBNP  CBG:  Recent Labs Lab 03/23/17 0753 03/24/17 0740  GLUCAP 97 142*    Microbiology: Results for orders placed or performed during the hospital encounter of 02/03/16  Rapid Influenza A&B Antigens ([place] only)     Status: None   Collection Time: 02/03/16  5:26 PM  Result Value Ref Range Status   Influenza A ([facility]) NEGATIVE NEGATIVE Final   Influenza B ([facility]) NEGATIVE NEGATIVE Final    Coagulation Studies: No results for input(s): LABPROT, INR in the last 72 hours.  Urinalysis: No results for input(s): COLORURINE, LABSPEC, PHURINE, GLUCOSEU, HGBUR, BILIRUBINUR, KETONESUR, PROTEINUR, UROBILINOGEN, NITRITE, LEUKOCYTESUR in the last 168 hours.  Invalid input(s): APPERANCEUR  Lipid Panel:     Component Value Date/Time   CHOL 168 01/06/2017 0632   TRIG 75 01/06/2017 0632   HDL 69 01/06/2017 0632   CHOLHDL 2.4 01/06/2017 0632   VLDL 15 01/06/2017 0632   LDLCALC 84 01/06/2017 0632    HgbA1C:  Lab Results  Component Value Date   HGBA1C 4.8 01/06/2017    Urine Drug Screen:     Component Value Date/Time   LABOPIA POSITIVE 05/31/2012 0035   COCAINSCRNUR NEGATIVE 05/31/2012 0035   LABBENZ NEGATIVE 05/31/2012 0035   AMPHETMU NEGATIVE 05/31/2012 0035   THCU NEGATIVE 05/31/2012 0035   LABBARB NEGATIVE 05/31/2012 0035    Alcohol Level: No results for input(s): ETH in the last 168 hours.  Other results: EKG: sinus rhythm at 93 bpm.  Imaging: Dg Chest 1 View  Result Date: 03/22/2017 CLINICAL DATA:  Fall. EXAM: CHEST 1 VIEW COMPARISON:  11/18/2016 FINDINGS: Normal heart size and mediastinal contours. No acute infiltrate or edema. No effusion or pneumothorax. No acute osseous findings. Postoperative right axilla. IMPRESSION: No active disease. Electronically Signed   By: Monte Fantasia M.D.   On: 03/22/2017 15:25   Ct Head Wo Contrast  Result Date: 03/22/2017 CLINICAL  DATA:  61 year old female status post syncope, fall from commode. EXAM: CT HEAD WITHOUT CONTRAST TECHNIQUE: Contiguous axial images were obtained from the base of the skull through the vertex without intravenous contrast. COMPARISON:  Brain MRI 01/06/2017 and earlier. FINDINGS: Brain: Stable cerebral volume. No midline shift, ventriculomegaly, mass effect, evidence of mass lesion, intracranial hemorrhage or evidence of cortically based acute infarction. No cortical encephalomalacia. Most of the nonspecific cerebral white matter signal changes seen by MRI are occult by CT. There is mild subcortical white matter  heterogeneity evident. Likewise, the chronic pontine abnormality on MRI is largely an apparent by CT. Vascular: Calcified atherosclerosis at the skull base. Skull: Stable and negative. No acute osseous abnormality identified. Sinuses/Orbits: Visualized paranasal sinuses and mastoids are stable and well pneumatized. Other: Stable and negative orbit and scalp soft tissues. IMPRESSION: 1. No acute intracranial abnormality. No acute traumatic injury identified. 2. Stable non contrast CT appearance of the brain. Little of the chronic cerebral white matter and pontine abnormality seen by MR is evident by CT. Electronically Signed   By: Genevie Ann M.D.   On: 03/22/2017 15:26   Ct Cervical Spine Wo Contrast  Result Date: 03/22/2017 CLINICAL DATA:  Pain following fall EXAM: CT CERVICAL SPINE WITHOUT CONTRAST TECHNIQUE: Multidetector CT imaging of the cervical spine was performed without intravenous contrast. Multiplanar CT image reconstructions were also generated. COMPARISON:  None. FINDINGS: Alignment: There is no evidence spondylolisthesis. Skull base and vertebrae: Skullbase and craniocervical junction regions appear normal. There is no evident fracture. There are no blastic or lytic bone lesions. Soft tissues and spinal canal: Prevertebral soft tissues and predental space regions are normal. There are no  paraspinous lesions. There is no evident cord or canal hematoma. Disc levels: There is moderate disc space narrowing at C6-7. Other disc spaces appear unremarkable. There is a focal anterior osteophyte along the inferior aspect of C6. There is mild facet osteoarthritic change at several levels bilaterally. No nerve root edema or effacement. No disc extrusion or stenosis. Upper chest: Visualized upper lung regions are clear. Other: There are foci of carotid artery calcification bilaterally. IMPRESSION: No fracture or spondylolisthesis. Osteoarthritic change at C6-7. No disc extrusion or stenosis evident. There are foci of carotid artery calcification bilaterally. Electronically Signed   By: Lowella Grip III M.D.   On: 03/22/2017 15:07   Dg Hip Unilat With Pelvis 2-3 Views Right  Result Date: 03/23/2017 CLINICAL DATA:  Hip pain.  Fall. EXAM: DG HIP (WITH OR WITHOUT PELVIS) 2-3V RIGHT COMPARISON:  None. FINDINGS: Degenerative changes with significant loss of joint space. No fracture. No dislocation. IMPRESSION: Degenerative changes with loss of joint space. No fracture or malalignment. Electronically Signed   By: Dorise Bullion III M.D   On: 03/23/2017 18:20     Assessment/Plan: 61 year old female with a 2+ month history of right wrist drop.  Has had MRI of the brain. CT of the cervical spine and MR of the brachial plexus with no etiology identified.  Felt to be secondary to a neuropathy.  To have MRI of the cervical spine today.  Will benefit from a NCV/EMG but this must be performed as an outpatient.  Recommend patient follow up with neurology as an outpatient.    Alexis Goodell, MD Neurology [phone] 03/24/2017, 12:03 PM

## 2017-03-24 NOTE — Progress Notes (Signed)
Subjective:  I am seeing patients evening. She was living currently in bed. Patient has received her right wrist splint. Patient reports pain as mild.  Appreciate neurology consult.  I reviewed the patient's C-spine and right hip MRI.  Objective:   VITALS:   Vitals:   03/23/17 2026 03/24/17 0604 03/24/17 0821 03/24/17 0827  BP: 92/66 98/60 98/62  (!) 66/55  Pulse: (!) 56 66 65 75  Resp: 16 18 12    Temp: 97.8 F (36.6 C) 98.6 F (37 C) 98.1 F (36.7 C)   TempSrc: Oral Oral Oral   SpO2: 100% 95% 100%   Weight:  52.3 kg (115 lb 6.4 oz)    Height:        PHYSICAL EXAM:  Right hand: Patient has a Velcro wrist splint. The wrist is now under control. Patient states that splint feels comfortable. Tender fingers. Her fingers are well-perfused. She has diminished sensation light touch.  Right hip: Patient remains neurovascularly intact distally. She is no significant pain with logrolling or active flexion to 45.   LABS  Results for orders placed or performed during the hospital encounter of 03/22/17 (from the past 24 hour(s))  CBC     Status: Abnormal   Collection Time: 03/24/17  5:03 AM  Result Value Ref Range   WBC 10.6 3.6 - 11.0 K/uL   RBC 2.85 (L) 3.80 - 5.20 MIL/uL   Hemoglobin 9.0 (L) 12.0 - 16.0 g/dL   HCT 27.3 (L) 35.0 - 47.0 %   MCV 95.7 80.0 - 100.0 fL   MCH 31.7 26.0 - 34.0 pg   MCHC 33.1 32.0 - 36.0 g/dL   RDW 17.7 (H) 11.5 - 14.5 %   Platelets 144 (L) 150 - 440 K/uL  Basic metabolic panel     Status: Abnormal   Collection Time: 03/24/17  5:03 AM  Result Value Ref Range   Sodium 142 135 - 145 mmol/L   Potassium 3.3 (L) 3.5 - 5.1 mmol/L   Chloride 113 (H) 101 - 111 mmol/L   CO2 22 22 - 32 mmol/L   Glucose, Bld 88 65 - 99 mg/dL   BUN 22 (H) 6 - 20 mg/dL   Creatinine, Ser 1.21 (H) 0.44 - 1.00 mg/dL   Calcium 8.1 (L) 8.9 - 10.3 mg/dL   GFR calc non Af Amer 48 (L) >60 mL/min   GFR calc Af Amer 56 (L) >60 mL/min   Anion gap 7 5 - 15  Glucose, capillary      Status: Abnormal   Collection Time: 03/24/17  7:40 AM  Result Value Ref Range   Glucose-Capillary 142 (H) 65 - 99 mg/dL    Mr Cervical Spine Wo Contrast  Result Date: 03/24/2017 CLINICAL DATA:  Right wrist drop. Decreased sensation in hands and feet. History of disc herniation at C6-7. Recent brachial plexus MRI at [facility], 02/23/2017, reportedly negative. EXAM: MRI CERVICAL SPINE WITHOUT CONTRAST TECHNIQUE: Multiplanar, multisequence MR imaging of the cervical spine was performed. No intravenous contrast was administered. COMPARISON:  None. FINDINGS: Alignment: Physiologic. Vertebrae: No fracture, evidence of discitis, or bone lesion. Cord: There is an unexpected rounded T2 hyperintensity in the dorsal midline cord, which appears somewhat thinned. This central, singular, rounded appearance is not typical of subacute combined degeneration, but would consider B12 and copper deficiency in this patient with history of mouth nutrition and gastric bypass surgery. No associated expansion to suggest this is acute demyelination or infectious myelitis. There is no historical indication of HIV. Posterior Fossa, vertebral arteries,  paraspinal tissues: No acute finding. Lacune in the pons which was also seen on prior MRI. Disc levels: C5-6 disc narrowing and broad central protrusion which contacts but does not compress the cord. No foraminal impingement to explain right arm symptoms. IMPRESSION: 1. Unexpected dorsal cord signal throughout the cervical levels. The appearance is not typical for subacute combined degeneration, but would consider T12 and copper deficiency in this patient with history of malnutrition and gastric bypass surgery. Please see further discussion above. 2. C6-7 disc protrusion with mild spinal stenosis. No foraminal impingement to explain right arm symptoms. Electronically Signed   By: Monte Fantasia M.D.   On: 03/24/2017 13:29   Mr Hip Right Wo Contrast  Result Date: 03/24/2017 CLINICAL DATA:   Presented to the ER after a fall out of her wheelchair. Patient states she has been having many recent falls. She was found to be anemic hypotensive and was admitted to the hospitalist service. At the time of admission she was also complaining of right hip pain. Patient [facility]. Patient today states that she was told by her primary care physician that she has a hip fracture that needs immediate attention. During my examination the patient states she is not currently having any right hip pain. EXAM: MR OF THE RIGHT HIP WITHOUT CONTRAST TECHNIQUE: Multiplanar, multisequence MR imaging was performed. No intravenous contrast was administered. COMPARISON:  None. FINDINGS: Bones: No left hip fracture, dislocation or avascular necrosis. No right hip fracture or dislocation. Subchondral serpiginous signal abnormality in the superior right femoral head with severe surrounding marrow edema most consistent with avascular necrosis. No articular surface collapse. No aggressive osseous lesion. Normal sacrum and sacroiliac joints. No SI joint widening or erosive changes. Moderate bilateral facet arthropathy at L5-S1. Articular cartilage and labrum Articular cartilage: High-grade partial-thickness cartilage loss with areas of full-thickness cartilage loss of the right femoral head and acetabulum. Mild partial-thickness cartilage loss of the left hip. Labrum: Grossly intact, but evaluation is limited by lack of intraarticular fluid. Joint or bursal effusion Joint effusion: Large right hip joint effusion. No left hip joint effusion. No SI joint effusion. Bursae:  No bursa formation. Muscles and tendons Flexors: Normal. Extensors: Normal. Abductors: Normal. Adductors: Normal. Gluteals: Normal. Hamstrings: Normal. Other findings Miscellaneous: No pelvic free fluid. No fluid collection or hematoma. No inguinal lymphadenopathy. No inguinal hernia. IMPRESSION: 1. Avascular necrosis of the right femoral head  with severe surrounding marrow edema involving the femoral head and neck and a large joint effusion. No articular surface collapse. 2. 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 3. Mild osteoarthritis of the left hip. 4. No hip fracture or dislocation. Electronically Signed   By: Kathreen Devoid   On: 03/24/2017 13:14   Dg Hip Unilat With Pelvis 2-3 Views Right  Result Date: 03/23/2017 CLINICAL DATA:  Hip pain.  Fall. EXAM: DG HIP (WITH OR WITHOUT PELVIS) 2-3V RIGHT COMPARISON:  None. FINDINGS: Degenerative changes with significant loss of joint space. No fracture. No dislocation. IMPRESSION: Degenerative changes with loss of joint space. No fracture or malalignment. Electronically Signed   By: Dorise Bullion III M.D   On: 03/23/2017 18:20    Assessment/Plan:     Active Problems:   Syncope   Anemia  MRI of the right hip confirm no fracture. Patient has evidence of AVN with surrounding edema. Patient will most likely need her hip replaced electively. There is no need for emergent surgery. Patient may weight-bear as tolerated on the right hip  and may need an assistive device.  Patient is okay to participate with physical therapy from an orthopedic standpoint.  On the MRI of the cervical spine the patient had increased T2 rounded hyperintensity within the midline dorsal cord throughout the cervical levels.  The radiologist states this could be consistent with B12 deficiency or copper deficiency patient had no significant neuroforaminal narrowing despite a disc bulge at C6-7.  Patient will follow up as an outpatient with neurology for an EMG.  I will defer clinical interpretation of the cervical spine MRI to neurology given the findings above.     We will sign off for now.   Thornton Park , MD 03/24/2017, 5:47 PM

## 2017-03-24 NOTE — Care Management Important Message (Signed)
Important Message  Patient Details  Name: Niveah Boerner MRN: [id number] Date of Birth: Sep 27, 1956   Medicare Important Message Given:  Yes Signed IM notice given   Katrina Stack, RN 03/24/2017, 2:23 PM

## 2017-03-25 LAB — BASIC METABOLIC PANEL
ANION GAP: 6 (ref 5–15)
BUN: 21 mg/dL — AB (ref 6–20)
CALCIUM: 8.5 mg/dL — AB (ref 8.9–10.3)
CO2: 23 mmol/L (ref 22–32)
Chloride: 118 mmol/L — ABNORMAL HIGH (ref 101–111)
Creatinine, Ser: 1.13 mg/dL — ABNORMAL HIGH (ref 0.44–1.00)
GFR calc Af Amer: >60 mL/min (ref >60)
GFR calc non Af Amer: 53 mL/min — ABNORMAL LOW (ref >60)
GLUCOSE: 74 mg/dL (ref 65–99)
Potassium: 3.5 mmol/L (ref 3.5–5.1)
Sodium: 147 mmol/L — ABNORMAL HIGH (ref 135–145)

## 2017-03-25 LAB — URINE CULTURE

## 2017-03-25 LAB — CBC
HEMATOCRIT: 30.2 % — AB (ref 35.0–47.0)
Hemoglobin: 10.1 g/dL — ABNORMAL LOW (ref 12.0–16.0)
MCH: 31.9 pg (ref 26.0–34.0)
MCHC: 33.3 g/dL (ref 32.0–36.0)
MCV: 96 fL (ref 80.0–100.0)
PLATELETS: 187 10*3/uL (ref 150–440)
RBC: 3.15 MIL/uL — ABNORMAL LOW (ref 3.80–5.20)
RDW: 18 % — ABNORMAL HIGH (ref 11.5–14.5)
WBC: 7.7 10*3/uL (ref 3.6–11.0)

## 2017-03-25 LAB — GLUCOSE, CAPILLARY: Glucose-Capillary: 76 mg/dL (ref 65–99)

## 2017-03-25 LAB — VITAMIN B12: Vitamin B-12: 1265 pg/mL — ABNORMAL HIGH (ref 180–914)

## 2017-03-25 MED ORDER — MIDODRINE HCL 10 MG PO TABS: 10.0000 mg | ORAL_TABLET | Freq: Three times a day (TID) | ORAL | 0 refills | Status: DC

## 2017-03-25 MED ORDER — CYCLOBENZAPRINE HCL 5 MG PO TABS: 5.0000 mg | ORAL_TABLET | Freq: Three times a day (TID) | ORAL | 0 refills | Status: DC | PRN

## 2017-03-25 MED ORDER — DOCUSATE SODIUM 100 MG PO CAPS: 100.0000 mg | ORAL_CAPSULE | Freq: Two times a day (BID) | ORAL | 0 refills | Status: DC

## 2017-03-25 NOTE — Progress Notes (Signed)
[facility] [place] NAME: Lindsay [name]    MR#:  [id number]  DATE OF BIRTH:  Mar 03, 1956  SUBJECTIVE:  CHIEF COMPLAINT:   Chief Complaint  Patient presents with  . Fall   - Gets upset emotionally very easily. -Complains of left leg shooting pain posteriorly. Blood pressure is low.  REVIEW OF SYSTEMS:  Review of Systems  Constitutional: Negative for chills, fever and malaise/fatigue.  HENT: Negative for congestion, ear discharge, hearing loss and nosebleeds.   Eyes: Negative for blurred vision.  Respiratory: Negative for cough, shortness of breath and wheezing.   Cardiovascular: Positive for leg swelling. Negative for chest pain and palpitations.  Gastrointestinal: Negative for abdominal pain, constipation, diarrhea, nausea and vomiting.  Genitourinary: Negative for dysuria.  Musculoskeletal: Positive for joint pain and myalgias.  Neurological: Negative for dizziness, sensory change, speech change, focal weakness, seizures and headaches.    DRUG ALLERGIES:   Allergies  Allergen Reactions  . Shrimp [Shellfish Allergy] Anaphylaxis    VITALS:  Blood pressure (!) 88/52, pulse 61, temperature 98.3 F (36.8 C), temperature source Oral, resp. rate 12, height 5\' 6"  (1.676 m), weight 52 kg (114 lb 12 oz), SpO2 100 %.  PHYSICAL EXAMINATION:  Physical Exam  GENERAL:  61 y.o.-year-old Chronically ill appearing patient lying in the bed with no acute distress.  EYES: Pupils equal, round, reactive to light and accommodation. No scleral icterus. Extraocular muscles intact.  HEENT: Head atraumatic, normocephalic. Oropharynx and nasopharynx clear.  NECK:  Supple, no jugular venous distention. No thyroid enlargement, no tenderness.  LUNGS: Normal breath sounds bilaterally, no wheezing, rales,rhonchi or crepitation. No use of accessory muscles of respiration.  CARDIOVASCULAR: S1, S2 normal. No rubs, or gallops. 2/6 systolic murmur is  present ABDOMEN: Soft, nontender, nondistended. Bowel sounds present. No organomegaly or mass.  EXTREMITIES: No  cyanosis, or clubbing. 1+ pedal edema noted NEUROLOGIC: Cranial nerves II through XII are intact. Muscle strength 5/5 in both upper extremities, 3/5 in lower extremities. Sensation intact. Gait not checked.  PSYCHIATRIC: The patient is alert and oriented x 2-3, emotionally upset and poor judgment.  SKIN: No obvious rash, lesion, or ulcer.    LABORATORY PANEL:   CBC  Recent Labs Lab 03/25/17 0631  WBC 7.7  HGB 10.1*  HCT 30.2*  PLT 187   ------------------------------------------------------------------------------------------------------------------  Chemistries   Recent Labs Lab 03/22/17 1421  03/25/17 0631  NA 145  < > 147*  K 3.5  < > 3.5  CL 115*  < > 118*  CO2 23  < > 23  GLUCOSE 78  < > 74  BUN 31*  < > 21*  CREATININE 1.25*  < > 1.13*  CALCIUM 8.1*  < > 8.5*  AST 27  --   --   ALT 16  --   --   ALKPHOS 114  --   --   BILITOT 0.8  --   --   < > = values in this interval not displayed. ------------------------------------------------------------------------------------------------------------------  Cardiac Enzymes  Recent Labs Lab 03/22/17 1421  TROPONINI <0.03   ------------------------------------------------------------------------------------------------------------------  RADIOLOGY:  Mr Cervical Spine Wo Contrast  Result Date: 03/24/2017 CLINICAL DATA:  Right wrist drop. Decreased sensation in hands and feet. History of disc herniation at C6-7. Recent brachial plexus MRI at [facility], 02/23/2017, reportedly negative. EXAM: MRI CERVICAL SPINE WITHOUT CONTRAST TECHNIQUE: Multiplanar, multisequence MR imaging of the cervical spine was performed. No intravenous contrast was administered. COMPARISON:  None. FINDINGS: Alignment: Physiologic.  Vertebrae: No fracture, evidence of discitis, or bone lesion. Cord: There is an unexpected rounded T2  hyperintensity in the dorsal midline cord, which appears somewhat thinned. This central, singular, rounded appearance is not typical of subacute combined degeneration, but would consider B12 and copper deficiency in this patient with history of mouth nutrition and gastric bypass surgery. No associated expansion to suggest this is acute demyelination or infectious myelitis. There is no historical indication of HIV. Posterior Fossa, vertebral arteries, paraspinal tissues: No acute finding. Lacune in the pons which was also seen on prior MRI. Disc levels: C5-6 disc narrowing and broad central protrusion which contacts but does not compress the cord. No foraminal impingement to explain right arm symptoms. IMPRESSION: 1. Unexpected dorsal cord signal throughout the cervical levels. The appearance is not typical for subacute combined degeneration, but would consider T12 and copper deficiency in this patient with history of malnutrition and gastric bypass surgery. Please see further discussion above. 2. C6-7 disc protrusion with mild spinal stenosis. No foraminal impingement to explain right arm symptoms. Electronically Signed   By: Monte Fantasia M.D.   On: 03/24/2017 13:29   Mr Hip Right Wo Contrast  Result Date: 03/24/2017 CLINICAL DATA:  Presented to the ER after a fall out of her wheelchair. Patient states she has been having many recent falls. She was found to be anemic hypotensive and was admitted to the hospitalist service. At the time of admission she was also complaining of right hip pain. Patient normally receives her care through [facility]. Patient today states that she was told by her primary care physician that she has a hip fracture that needs immediate attention. During my examination the patient states she is not currently having any right hip pain. EXAM: MR OF THE RIGHT HIP WITHOUT CONTRAST TECHNIQUE: Multiplanar, multisequence MR imaging was performed. No intravenous contrast was administered.  COMPARISON:  None. FINDINGS: Bones: No left hip fracture, dislocation or avascular necrosis. No right hip fracture or dislocation. Subchondral serpiginous signal abnormality in the superior right femoral head with severe surrounding marrow edema most consistent with avascular necrosis. No articular surface collapse. No aggressive osseous lesion. Normal sacrum and sacroiliac joints. No SI joint widening or erosive changes. Moderate bilateral facet arthropathy at L5-S1. Articular cartilage and labrum Articular cartilage: High-grade partial-thickness cartilage loss with areas of full-thickness cartilage loss of the right femoral head and acetabulum. Mild partial-thickness cartilage loss of the left hip. Labrum: Grossly intact, but evaluation is limited by lack of intraarticular fluid. Joint or bursal effusion Joint effusion: Large right hip joint effusion. No left hip joint effusion. No SI joint effusion. Bursae:  No bursa formation. Muscles and tendons Flexors: Normal. Extensors: Normal. Abductors: Normal. Adductors: Normal. Gluteals: Normal. Hamstrings: Normal. Other findings Miscellaneous: No pelvic free fluid. No fluid collection or hematoma. No inguinal lymphadenopathy. No inguinal hernia. IMPRESSION: 1. Avascular necrosis of the right femoral head with severe surrounding marrow edema involving the femoral head and neck and a large joint effusion. No articular surface collapse. 2. 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 3. Mild osteoarthritis of the left hip. 4. No hip fracture or dislocation. Electronically Signed   By: Kathreen Devoid   On: 03/24/2017 13:14   Dg Hip Unilat With Pelvis 2-3 Views Right  Result Date: 03/23/2017 CLINICAL DATA:  Hip pain.  Fall. EXAM: DG HIP (WITH OR WITHOUT PELVIS) 2-3V RIGHT COMPARISON:  None. FINDINGS: Degenerative changes with significant loss of joint space. No fracture. No dislocation. IMPRESSION: Degenerative changes with loss  of joint space. No fracture or  malalignment. Electronically Signed   By: Dorise Bullion III M.D   On: 03/23/2017 18:20    EKG:   Orders placed or performed during the hospital encounter of 03/22/17  . ED EKG  . ED EKG    ASSESSMENT AND PLAN:   61 year old female with multiple medical problems including chronic systolic CHF with last EF of 45-50%, anemia of chronic disease, recent history of C. difficile colitis, history of DVT status post IVC, fibromyalgia, right hip avascular necrosis, right wrist drop, non-ischemic cardiomyopathy, malnutrition  presents to the hospital secondary to fall and syncope.  #1 falls and weakness-secondary to significant orthostatic hypotension. -Started on Methergine, also on Florinef. Other medications that with decreased blood pressure to help. -Continue checking orthostatics. If still positive, we'll give fluid bolus today. -Physical therapy and OT consult requested. Likely will need rehabilitation at discharge.  #2 right wrist drop-MRI of C-spine did not show any acute cardiac compression. Appreciate neurology consult. -Currently in a splint. Outpatient EMG study is recommended.  #3 right hip avascular necrosis-appreciate orthopedic consult. Weightbearing as tolerated recommended. -Non-urgent surgery as outpatient. -Physical therapy consult is pending.  #4 history of paroxysmal atrial fibrillation-rate is controlled. Continue flecainide. Not a candidate for anticoagulation. Currently on aspirin.  #5 peripheral neuropathy has been on Lyrica. Has muscle spasms. Zanaflex is on hold due to her low blood pressure.  #6 congestive heart failure-chronic systolic dysfunction. Not in any acute exacerbation. If needed IV fluids will be given.  monitor carefully.  #7 history of breast cancer-continue anastrozole  #8-palliative care has been consulted to address goals of treatment.  #9 DVT prophylaxis-on Lovenox   All the records are reviewed and case discussed with Care  Management/Social Workerr. Management plans discussed with the patient, family and they are in agreement.  CODE STATUS: Full code  TOTAL TIME TAKING CARE OF THIS PATIENT: 38 minutes.   POSSIBLE D/C IN 2-3 DAYS, DEPENDING ON CLINICAL CONDITION.   Gladstone Lighter M.D on 03/25/2017 at 10:41 AM  Between 7am to 6pm - Pager - [phone]  After 6pm go to [email or web address] - password [email or web address]  Office  [phone]  CC: Primary care physician; System, Pcp Not In

## 2017-03-25 NOTE — Progress Notes (Signed)
Subjective: Patient unchanged.  No new neurological complaints  Objective: Current vital signs: BP 116/78 (BP Location: Left Arm)   Pulse 62   Temp 98.3 F (36.8 C) (Oral)   Resp 12   Ht 5\' 6"  (1.676 m)   Wt 52 kg (114 lb 12 oz)   SpO2 100%   BMI 18.52 kg/m  Vital signs in last 24 hours: Temp:  [97.6 F (36.4 C)-98.3 F (36.8 C)] 98.3 F (36.8 C) (05/19 1217) Pulse Rate:  [61-78] 62 (05/19 1217) Resp:  [12-16] 12 (05/19 1217) BP: (80-116)/(52-78) 116/78 (05/19 1217) SpO2:  [99 %-100 %] 100 % (05/19 1217) Weight:  [52 kg (114 lb 12 oz)] 52 kg (114 lb 12 oz) (05/19 0558)  Intake/Output from previous day: 05/18 0701 - 05/19 0700 In: 240 [P.O.:240] Out: 852 [Urine:850; Stool:2] Intake/Output this shift: Total I/O In: 480 [P.O.:480] Out: -  Nutritional status: Diet regular Room service appropriate? Yes; Fluid consistency: Thin  Neurologic Exam: Mental Status: Alert, oriented, thought content appropriate. Speech fluent without evidence of aphasia. Able to follow 3 step commands without difficulty. Cranial Nerves: II: Discs flat bilaterally; Visual fields grossly normal, pupils equal, round, reactive to light and accommodation III,IV, VI: ptosis not present, extra-ocular motions intact bilaterally V,VII: face symmetric, facial light touch sensation normal bilaterally VIII: hearing normal bilaterally IX,X: gag reflex present XI: bilateral shoulder shrug XII: midline tongue extension Motor: Right :Upper extremity 4/5 with significant wrist dropLeft: Upper extremity 5/5 Lower extremity 2+/5Lower extremity 3-/5 Tone and bulk:normal tone throughout; no atrophy noted   Lab Results: Basic Metabolic Panel:  Recent Labs Lab 03/22/17 1421 03/23/17 0552 03/24/17 0503 03/25/17 0631  NA 145 143 142 147*  K  3.5 3.1* 3.3* 3.5  CL 115* 114* 113* 118*  CO2 23 23 22 23   GLUCOSE 78 110* 88 74  BUN 31* 23* 22* 21*  CREATININE 1.25* 1.21* 1.21* 1.13*  CALCIUM 8.1* 7.5* 8.1* 8.5*    Liver Function Tests:  Recent Labs Lab 03/22/17 1421  AST 27  ALT 16  ALKPHOS 114  BILITOT 0.8  PROT 4.7*  ALBUMIN 2.1*   No results for input(s): LIPASE, AMYLASE in the last 168 hours. No results for input(s): AMMONIA in the last 168 hours.  CBC:  Recent Labs Lab 03/22/17 1421 03/23/17 0552 03/24/17 0503 03/25/17 0631  WBC 8.1 9.1 10.6 7.7  NEUTROABS 6.3  --   --   --   HGB 7.0* 8.6* 9.0* 10.1*  HCT 21.4* 25.6* 27.3* 30.2*  MCV 100.0 96.6 95.7 96.0  PLT 162 142* 144* 187    Cardiac Enzymes:  Recent Labs Lab 03/22/17 1421 03/23/17 1158  CKTOTAL 80 56  TROPONINI <0.03  --     Lipid Panel: No results for input(s): CHOL, TRIG, HDL, CHOLHDL, VLDL, LDLCALC in the last 168 hours.  CBG:  Recent Labs Lab 03/23/17 0753 03/24/17 0740 03/25/17 0729  GLUCAP 97 142* 74    Microbiology: Results for orders placed or performed during the hospital encounter of 03/22/17  Urine culture     Status: Abnormal   Collection Time: 03/23/17  8:38 PM  Result Value Ref Range Status   Specimen Description URINE, RANDOM  Final   Special Requests NONE  Final   Culture MULTIPLE SPECIES PRESENT, SUGGEST RECOLLECTION (A)  Final   Report Status 03/25/2017 FINAL  Final    Coagulation Studies: No results for input(s): LABPROT, INR in the last 72 hours.  Imaging: Mr Cervical Spine Wo Contrast  Result  Date: 03/24/2017 CLINICAL DATA:  Right wrist drop. Decreased sensation in hands and feet. History of disc herniation at C6-7. Recent brachial plexus MRI at [facility], 02/23/2017, reportedly negative. EXAM: MRI CERVICAL SPINE WITHOUT CONTRAST TECHNIQUE: Multiplanar, multisequence MR imaging of the cervical spine was performed. No intravenous contrast was administered. COMPARISON:  None. FINDINGS: Alignment:  Physiologic. Vertebrae: No fracture, evidence of discitis, or bone lesion. Cord: There is an unexpected rounded T2 hyperintensity in the dorsal midline cord, which appears somewhat thinned. This central, singular, rounded appearance is not typical of subacute combined degeneration, but would consider B12 and copper deficiency in this patient with history of mouth nutrition and gastric bypass surgery. No associated expansion to suggest this is acute demyelination or infectious myelitis. There is no historical indication of HIV. Posterior Fossa, vertebral arteries, paraspinal tissues: No acute finding. Lacune in the pons which was also seen on prior MRI. Disc levels: C5-6 disc narrowing and broad central protrusion which contacts but does not compress the cord. No foraminal impingement to explain right arm symptoms. IMPRESSION: 1. Unexpected dorsal cord signal throughout the cervical levels. The appearance is not typical for subacute combined degeneration, but would consider T12 and copper deficiency in this patient with history of malnutrition and gastric bypass surgery. Please see further discussion above. 2. C6-7 disc protrusion with mild spinal stenosis. No foraminal impingement to explain right arm symptoms. Electronically Signed   By: Monte Fantasia M.D.   On: 03/24/2017 13:29   Mr Hip Right Wo Contrast  Result Date: 03/24/2017 CLINICAL DATA:  Presented to the ER after a fall out of her wheelchair. Patient states she has been having many recent falls. She was found to be anemic hypotensive and was admitted to the hospitalist service. At the time of admission she was also complaining of right hip pain. Patient [facility]. Patient today states that she was told by her primary care physician that she has a hip fracture that needs immediate attention. During my examination the patient states she is not currently having any right hip pain. EXAM: MR OF THE RIGHT HIP WITHOUT CONTRAST  TECHNIQUE: Multiplanar, multisequence MR imaging was performed. No intravenous contrast was administered. COMPARISON:  None. FINDINGS: Bones: No left hip fracture, dislocation or avascular necrosis. No right hip fracture or dislocation. Subchondral serpiginous signal abnormality in the superior right femoral head with severe surrounding marrow edema most consistent with avascular necrosis. No articular surface collapse. No aggressive osseous lesion. Normal sacrum and sacroiliac joints. No SI joint widening or erosive changes. Moderate bilateral facet arthropathy at L5-S1. Articular cartilage and labrum Articular cartilage: High-grade partial-thickness cartilage loss with areas of full-thickness cartilage loss of the right femoral head and acetabulum. Mild partial-thickness cartilage loss of the left hip. Labrum: Grossly intact, but evaluation is limited by lack of intraarticular fluid. Joint or bursal effusion Joint effusion: Large right hip joint effusion. No left hip joint effusion. No SI joint effusion. Bursae:  No bursa formation. Muscles and tendons Flexors: Normal. Extensors: Normal. Abductors: Normal. Adductors: Normal. Gluteals: Normal. Hamstrings: Normal. Other findings Miscellaneous: No pelvic free fluid. No fluid collection or hematoma. No inguinal lymphadenopathy. No inguinal hernia. IMPRESSION: 1. Avascular necrosis of the right femoral head with severe surrounding marrow edema involving the femoral head and neck and a large joint effusion. No articular surface collapse. 2. 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 3. Mild osteoarthritis of the left hip. 4. No hip fracture or dislocation. Electronically Signed   By: Elbert Ewings  Patel   On: 03/24/2017 13:14   Dg Hip Unilat With Pelvis 2-3 Views Right  Result Date: 03/23/2017 CLINICAL DATA:  Hip pain.  Fall. EXAM: DG HIP (WITH OR WITHOUT PELVIS) 2-3V RIGHT COMPARISON:  None. FINDINGS: Degenerative changes with significant loss of joint space. No  fracture. No dislocation. IMPRESSION: Degenerative changes with loss of joint space. No fracture or malalignment. Electronically Signed   By: Dorise Bullion III M.D   On: 03/23/2017 18:20    Medications:  I have reviewed the patient's current medications. Scheduled: . anastrozole  1 mg Oral Daily  . aspirin EC  81 mg Oral Daily  . cholecalciferol  1,000 Units Oral Daily  . docusate sodium  100 mg Oral BID  . enoxaparin (LOVENOX) injection  40 mg Subcutaneous Q24H  . flecainide  50 mg Oral Q12H  . fludrocortisone  0.2 mg Oral BID  . fluticasone  1 spray Each Nare Daily  . midodrine  10 mg Oral TID WC  . mirtazapine  15 mg Oral QHS  . morphine  15 mg Oral Q8H  . pantoprazole  40 mg Oral Daily  . potassium chloride  20 mEq Oral BID  . pregabalin  150 mg Oral TID  . sertraline  150 mg Oral QHS  . vitamin B-12  1,000 mcg Oral Daily  . vitamin C  500 mg Oral Daily    Assessment/Plan: Neuro exam stable.  MRI of the cervical spine shows T2 hyperintensity in the dorsal cervical cord.  Unclear etiology.  RPR, B12 and copper ordered.  B12 elevated.  Recommendations: 1.  Will continue to follow up labs 2.  Patient to have NCV/EMG on an outpatient basis 3.  Continued therapy    LOS: 2 days   Alexis Goodell, MD Neurology [phone] 03/25/2017  5:38 PM

## 2017-03-25 NOTE — Care Management Note (Signed)
Case Management Note  Patient Details  Name: Lindsay [name] MRN: [id number] Date of Birth: [birth year]  Subjective/Objective:      [name] is refusing to be discharged to a Rehab facility. She requests to be discharged home with home health services. [name] chose [facility] her home health services provider if she is discharged home with home health.            Action/Plan:   Expected Discharge Date:  03/24/17               Expected Discharge Plan:     In-House Referral:     Discharge planning Services     Post Acute Care Choice:    Choice offered to:     DME Arranged:    DME Agency:     HH Arranged:    HH Agency:     Status of Service:     If discussed at [employer], dates discussed:    Additional Comments:  Peachie Barkalow A, RN 03/25/2017, 2:22 PM

## 2017-03-25 NOTE — Plan of Care (Signed)
Problem: Acute Rehab PT Goals(only PT should resolve) Goal: Pt Will Ambulate With platform walker and assistance

## 2017-03-25 NOTE — Progress Notes (Signed)
Occupational Therapy Treatment Patient Details Name: Lindsay [name] MRN: [id number] DOB: 05/14/1956 Today's Date: 03/25/2017    History of present illness 61 y.o. female with a painful R hip after passing out in wc at home was cleared for mobility by orthopedist, no fracture.  Transfused, now no longer orthostatic.  R wrist drop, splinted.  PMHx:  atrial fibrillation, fibromyalgia, chronic pain syndrome, falls x last 2 weeks, no use of her right arm from before, disc herniation at C6-7, neuropathy and CHF   OT comments  OT supine in bed when OT arrived. Patient right hand checked for skin issues with wearing splint. No redness in web space, but indention from bracelet noted over ulnar head at wrist. Patient educated on skin integrity and need to move bracelet to left arm, but patient refused. Educated patient on need to have skin checked frequently to prevent breakdown, and then adjusted bracelet so it was located proximal on forearm and between straps to decrease pressure. Nursing present, and educated on positioning and need for skin checks. Patient stated she would prefer to not wear splint, but educated on need and positioning for functional tasks. After demonstration, patient verbalized understanding and stated would continue to wear. Patient performed AAROM RUE. Patient educated on positioning to increase functional movement of RUE.    Follow Up Recommendations  SNF    Equipment Recommendations       Recommendations for Other Services      Precautions / Restrictions Precautions Precautions: Fall Precaution Comments: ck vitals, BP  Restrictions Weight Bearing Restrictions: No Other Position/Activity Restrictions: no OOB until cleared by Ortho       Mobility Bed Mobility Overal bed mobility: Needs Assistance Bed Mobility: Rolling;Supine to Sit;Sit to Supine Rolling: Min guard   Supine to sit: Mod assist Sit to supine: Mod assist   General bed mobility comments: assisted trunk  to sit up and pad for hips, then legs lifted back to bed  Transfers Overall transfer level: Needs assistance Equipment used: 1 person hand held assist Transfers: Sit to/from Stand Sit to Stand: Min assist         General transfer comment: assisted pt with LUE support to stand due to concerns about BP    Balance Overall balance assessment: History of Falls;Needs assistance Sitting-balance support: Feet supported Sitting balance-Leahy Scale: Fair Sitting balance - Comments: inconsistent due to pt leaning all directions suddenly at times                                   ADL either performed or assessed with clinical judgement   ADL                                               Vision Patient Visual Report: No change from baseline     Perception     Praxis      Cognition Arousal/Alertness: Awake/alert Behavior During Therapy: WFL for tasks assessed/performed Overall Cognitive Status: Within Functional Limits for tasks assessed                                 General Comments: pt is reporting mobility PLOF in conflict with nursing notes        Exercises General Exercises -  Upper Extremity Shoulder Flexion: AAROM;10 reps;Strengthening;Right Elbow Flexion: AAROM;Strengthening;Right;10 reps Digit Composite Flexion: AAROM;Strengthening;Right;10 reps   Shoulder Instructions       General Comments      Pertinent Vitals/ Pain       Pain Assessment: No/denies pain  Home Living Family/patient expects to be discharged to:: Private residence Living Arrangements: Alone Available Help at Discharge: Neighbor;Personal care attendant Type of Home: Apartment Home Access: Level entry     Home Layout: One level     Bathroom Shower/Tub: Teacher, early years/pre: Standard     Home Equipment: Environmental consultant - 4 wheels;Walker - 2 wheels;Wheelchair - Education officer, community - power;Cane - single point          Prior  Functioning/Environment Level of Independence: Needs assistance  Gait / Transfers Assistance Needed: pt states she uses walker vs SPC when Needed ADL's / Homemaking Assistance Needed: Pt. has daily personal care aides to assist with ADLs.        Frequency  Min 1X/week        Progress Toward Goals  OT Goals(current goals can now be found in the care plan section)  Progress towards OT goals: Progressing toward goals  Acute Rehab OT Goals Patient Stated Goal: using right hand again OT Goal Formulation: With patient Time For Goal Achievement: 04/07/17 Potential to Achieve Goals: Good  Plan Discharge plan remains appropriate    Co-evaluation                 AM-PAC PT "6 Clicks" Daily Activity     Outcome Measure   Help from another person eating meals?: A Lot Help from another person taking care of personal grooming?: A Little Help from another person toileting, which includes using toliet, bedpan, or urinal?: Total Help from another person bathing (including washing, rinsing, drying)?: A Lot Help from another person to put on and taking off regular upper body clothing?: A Little Help from another person to put on and taking off regular lower body clothing?: Total 6 Click Score: 12    End of Session    OT Visit Diagnosis: Muscle weakness (generalized) (M62.81)   Activity Tolerance Patient tolerated treatment well   Patient Left in bed;with call bell/phone within reach;with bed alarm set   Nurse Communication Other (comment) (Nursing educated on making sure bracelet is not over bony prominence of wrist when patient is wearing right splint.)        Time: 7741-4239 OT Time Calculation (min): 25 min  Charges: OT Treatments $Therapeutic Exercise: 23-37 mins  Lindsay [name], OTR/L   Lindsay [name] L 03/25/2017, 1:56 PM

## 2017-03-25 NOTE — Progress Notes (Signed)
She gets upset when told something she doesn't want to hear or asked too many questions.  She then refuses to cooperate.   Her history varies.  She reported on admission that she was wheelchair bound.  Today she told PT that she uses a cane or Rolator as needed.

## 2017-03-25 NOTE — Evaluation (Signed)
Physical Therapy Evaluation Patient Details Name: Lindsay [name] MRN: [id number] DOB: 11/23/1955 Today's Date: 03/25/2017   History of Present Illness  61 y.o. female with a painful R hip after passing out in wc at home was cleared for mobility by orthopedist, no fracture.  Transfused, now no longer orthostatic.  R wrist drop, splinted.  PMHx:  atrial fibrillation, fibromyalgia, chronic pain syndrome, falls x last 2 weeks, no use of her right arm from before, disc herniation at C6-7, neuropathy and CHF  Clinical Impression  Pt is up to stand with assistance, using HHA for LUE and noted BP as follows:  Supine 114/75, sitting 92/70 and standing 122/96 in LUE.  Pt immediately asked to get back in bed and later left her to report to nsg the BP values.   Will follow acutely for instruction of strengthening ex's and work on increasing time OOB and standing with SNF recommendation given her low activity level.    Follow Up Recommendations SNF    Equipment Recommendations  None recommended by PT    Recommendations for Other Services       Precautions / Restrictions Precautions Precautions: Fall Precaution Comments: ck vitals, BP  Restrictions Weight Bearing Restrictions: No      Mobility  Bed Mobility Overal bed mobility: Needs Assistance Bed Mobility: Rolling;Supine to Sit;Sit to Supine Rolling: Min guard   Supine to sit: Mod assist Sit to supine: Mod assist   General bed mobility comments: assisted trunk to sit up and pad for hips, then legs lifted back to bed  Transfers Overall transfer level: Needs assistance Equipment used: 1 person hand held assist Transfers: Sit to/from Stand Sit to Stand: Min assist         General transfer comment: assisted pt with LUE support to stand due to concerns about BP  Ambulation/Gait             General Gait Details: deferred by pt  Hotel manager mobility:  (deferred,  has  a power wc)  Modified Rankin (Stroke Patients Only)       Balance Overall balance assessment: History of Falls;Needs assistance Sitting-balance support: Feet supported Sitting balance-Leahy Scale: Fair Sitting balance - Comments: inconsistent due to pt leaning all directions suddenly at times                                     Pertinent Vitals/Pain Pain Assessment: No/denies pain    Home Living Family/patient expects to be discharged to:: Private residence Living Arrangements: Alone Available Help at Discharge: Neighbor;Personal care attendant Type of Home: Apartment Home Access: Level entry     Home Layout: One level Home Equipment: Walker - 4 wheels;Walker - 2 wheels;Wheelchair - Education officer, community - power;Cane - single point      Prior Function Level of Independence: Needs assistance   Gait / Transfers Assistance Needed: pt states she uses walker vs SPC when Needed  ADL's / Homemaking Assistance Needed: Pt. has daily personal care aides to assist with ADLs.         Hand Dominance   Dominant Hand:  (prev reports R handedness but splint on R hand)    Extremity/Trunk Assessment   Upper Extremity Assessment Upper Extremity Assessment: RUE deficits/detail RUE Deficits / Details: Pt wearing a R hand splint for wrist drop RUE: Unable to fully assess due to  immobilization    Lower Extremity Assessment Lower Extremity Assessment: Generalized weakness    Cervical / Trunk Assessment Cervical / Trunk Assessment: Normal  Communication   Communication: No difficulties  Cognition Arousal/Alertness: Awake/alert Behavior During Therapy: Anxious;Impulsive Overall Cognitive Status: No family/caregiver present to determine baseline cognitive functioning                                 General Comments: pt is reporting mobility PLOF in conflict with nursing notes      General Comments      Exercises     Assessment/Plan    PT  Assessment    PT Problem List         PT Treatment Interventions      PT Goals (Current goals can be found in the Care Plan section)  Acute Rehab PT Goals Patient Stated Goal: to not overdo, to not get up yet  PT Goal Formulation: With patient Time For Goal Achievement: 04/08/17 Potential to Achieve Goals: Good    Frequency     Barriers to discharge        Co-evaluation               AM-PAC PT "6 Clicks" Daily Activity  Outcome Measure Difficulty turning over in bed (including adjusting bedclothes, sheets and blankets)?: None Difficulty moving from lying on back to sitting on the side of the bed? : Total Difficulty sitting down on and standing up from a chair with arms (e.g., wheelchair, bedside commode, etc,.)?: Total Help needed moving to and from a bed to chair (including a wheelchair)?: A Lot Help needed walking in hospital room?: A Lot Help needed climbing 3-5 steps with a railing? : A Lot 6 Click Score: 12    End of Session   Activity Tolerance: Patient limited by fatigue (asked to get back to bed) Patient left: in bed;with call bell/phone within reach Nurse Communication: Mobility status;Other (comment) (reported her normal BP's)      Time: 0034-9179 PT Time Calculation (min) (ACUTE ONLY): 37 min   Charges:   PT Evaluation $PT Eval Moderate Complexity: 1 Procedure PT Treatments $Therapeutic Activity: 8-22 mins   PT G Codes:   PT G-Codes **NOT FOR INPATIENT CLASS** Functional Assessment Tool Used: AM-PAC 6 Clicks Basic Mobility    Ramond Dial 03/25/2017, 10:49 AM   Mee Hives, PT MS Acute Rehab Dept. Number: [place] [facility] [place]

## 2017-03-25 NOTE — Progress Notes (Signed)
[facility] called to notify us that Ms Baldo had been assigned a bed.  The bed request was made on 5/17 but [facility] was on diversion and not accepting transfers.  Since then the workup for the syncope, hypotension, and right hand drop is in progress.  Her blood pressure is improving. I spoke with Dr. Tressia Miners about the transfer and she said she would complete the necessary paper work if the patient still wanted to be transferred.  I let Nylia know that a bed was available, but as her workup was underway, and her blood pressure was showing signs of improvement, did she still want to be transferred.  She declined the bed at [facility].

## 2017-03-25 NOTE — Discharge Summary (Signed)
[place] [facility] [place] NAME: Lindsay [name]    MR#:  [id number]  DATE OF BIRTH:  [birth year]  DATE OF ADMISSION:  03/22/2017   ADMITTING PHYSICIAN: Epifanio Lesches, MD  DATE OF DISCHARGE:  03/25/17  PRIMARY CARE PHYSICIAN: System, Pcp Not In   ADMISSION DIAGNOSIS:   Syncope, unspecified syncope type [R55] Anemia, unspecified type [D64.9]  DISCHARGE DIAGNOSIS:   Active Problems:   Syncope   Anemia   SECONDARY DIAGNOSIS:   Past Medical History:  Diagnosis Date  . Anemia of chronic disease   . C. difficile diarrhea 10/2011  . Cancer ([place])   . Chronic cough   . Chronic systolic CHF (congestive heart failure) ([place])    a. echo 05/2015: EF 45-50%, nl wall motion, mod MR, nl PASP  . Depression   . Depression   . Fibromyalgia   . History of DVT (deep vein thrombosis)    a. s/p IVC filter 2014  . HX: breast cancer    a. right-side; b. s/p lumpectomy & chemo w/ Docetaxol, cytoxan, adriamycin  . Kidney stones 2013  . Malnutrition ([place])    a. chronic diarrhea s/p most meals   . Moderate mitral regurgitation   . Muscle spasm   . NICM (nonischemic cardiomyopathy) ([place])    a. Lexiscan 08/2015: no sig ischemia, no ECG changes concerning for ischemia (baseline abnormal T wave along anterolateral leads), EF 61%, low risk scan  . Orthostatic hypotension   . PAF (paroxysmal atrial fibrillation) ([place])    a. not on long term, full-dose anticoagulation since 2011, had high INR at that time 2/2 possible malnutition, has been off anticoagulation since; b. CHADS2VASc at least 2 (CHF, female); c. 48 hr Holter 09/2015 NSR w/ rare PACs, no significant PVCs, periods of sinus tachycarida with average heart rate typically in the 60-100 range. No sig arrhythmia. No Afib seen.   . 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 Postural hypotension   . S/P gastric bypass   . Vitamin D deficiency     HOSPITAL COURSE:   61 year old female with  multiple medical problems including chronic systolic CHF with last EF of 45-50%, anemia of chronic disease, recent history of C. difficile colitis, history of DVT status post IVC, fibromyalgia, right hip avascular necrosis, right wrist drop, non-ischemic cardiomyopathy, malnutrition  presents to the hospital secondary to fall and syncope.  #1 falls and weakness-secondary to significant orthostatic hypotension. -Started on Methergine, also on Florinef. Other medications that with decreased blood pressure to help. -Continue checking orthostatics. If still positive, we'll give fluid bolus today. -Physical therapy and OT consult requested. Likely will need rehabilitation at discharge.  #2 right wrist drop-MRI of C-spine did not show any acute cardiac compression. Appreciate neurology consult. -Currently in a splint. Outpatient EMG study is recommended.  #3 right hip avascular necrosis-appreciate orthopedic consult. Weightbearing as tolerated recommended. -Non-urgent surgery as outpatient. Patient following at [facility] for the same and surgery scheduled there due to constant pain. -Physical therapy consult is pending.  #4 history of paroxysmal atrial fibrillation-rate is controlled. Continue flecainide. Not a candidate for anticoagulation. Currently on aspirin.  #5 peripheral neuropathy has been on Lyrica. Has muscle spasms. Zanaflex is on hold due to her low blood pressure.  #6 congestive heart failure-chronic systolic dysfunction. Not in any acute exacerbation. If needed IV fluids will be given.  monitor carefully.  #7 history of breast cancer-continue anastrozole  #8-palliative care has been consulted  to address goals of treatment.  A transfer to [facility] was requested earlier in admission due to her right hip AVN and pain as patient is followed there. Transfer when bed available.  DISCHARGE CONDITIONS:   Guarded  CONSULTS OBTAINED:   Treatment Team:  [place], MD Alexis Goodell, MD  DRUG ALLERGIES:   Allergies  Allergen Reactions  . Shrimp [Shellfish Allergy] Anaphylaxis   DISCHARGE MEDICATIONS:   Allergies as of 03/25/2017      Reactions   Shrimp [shellfish Allergy] Anaphylaxis      Medication List    STOP taking these medications   spironolactone 25 MG tablet Commonly known as:  ALDACTONE   tiZANidine 4 MG tablet Commonly known as:  ZANAFLEX     TAKE these medications   anastrozole 1 MG tablet Commonly known as:  ARIMIDEX Take 1 mg by mouth daily.   aspirin EC 81 MG tablet Take 81 mg by mouth daily.   clonazePAM 0.5 MG tablet Commonly known as:  KLONOPIN Take 0.5 mg by mouth at bedtime as needed for anxiety.   cyclobenzaprine 5 MG tablet Commonly known as:  FLEXERIL Take 1 tablet (5 mg total) by mouth 3 (three) times daily as needed for muscle spasms.   docusate sodium 100 MG capsule Commonly known as:  COLACE Take 1 capsule (100 mg total) by mouth 2 (two) times daily.   flecainide 100 MG tablet Commonly known as:  TAMBOCOR Take 0.5 tablets (50 mg total) by mouth every 12 (twelve) hours.   fludrocortisone 0.1 MG tablet Commonly known as:  FLORINEF Take 0.2 mg by mouth daily.   fluticasone 50 MCG/ACT nasal spray Commonly known as:  FLONASE Place 1 spray into the nose daily.   lidocaine 5 % Commonly known as:  LIDODERM Place 1-3 patches onto the skin daily. Remove & Discard patch within 12 hours or as directed by MD   loperamide 2 MG capsule Commonly known as:  IMODIUM Take 2 mg by mouth 4 (four) times daily as needed for diarrhea or loose stools.   loratadine 10 MG tablet Commonly known as:  CLARITIN Take 10 mg by mouth daily as needed for allergies.   midodrine 10 MG tablet Commonly known as:  PROAMATINE Take 1 tablet (10 mg total) by mouth 3 (three) times daily with meals. What changed:  medication strength  how much to take  additional instructions   mirtazapine 15 MG tablet Commonly known as:   REMERON Take 15 mg by mouth at bedtime.   morphine 15 MG 12 hr tablet Commonly known as:  MS CONTIN Take 1 tablet (15 mg total) by mouth every 8 (eight) hours.   omeprazole 40 MG capsule Commonly known as:  PRILOSEC Take 40 mg by mouth daily.   oxyCODONE 15 MG immediate release tablet Commonly known as:  ROXICODONE Take 15 mg by mouth every 6 (six) hours as needed for pain.   potassium chloride 10 MEQ tablet Commonly known as:  K-DUR Take 20 mEq by mouth 2 (two) times daily.   pregabalin 150 MG capsule Commonly known as:  LYRICA Take 150 mg by mouth 3 (three) times daily.   sertraline 100 MG tablet Commonly known as:  ZOLOFT Take 150 mg by mouth daily.   triamcinolone cream 0.1 % Commonly known as:  KENALOG Apply 1 application topically 2 (two) times daily.   vitamin B-12 1000 MCG tablet Commonly known as:  CYANOCOBALAMIN Take 1,000 mcg by mouth daily.   vitamin C 500 MG  tablet Commonly known as:  ASCORBIC ACID Take 500 mg by mouth daily.   Vitamin D3 1000 units Caps Take 1,000 Units by mouth daily.        DISCHARGE INSTRUCTIONS:   1. Orthopedic follow up at [facility]  DIET:   Regular diet  ACTIVITY:   As tolerated  OXYGEN:   Home Oxygen: No Oxygen Delivery: Room air  DISCHARGE LOCATION:   [facility]  If you experience worsening of your admission symptoms, develop shortness of breath, life threatening emergency, suicidal or homicidal thoughts you must seek medical attention immediately by calling 911 or calling your MD immediately  if symptoms less severe.  You Must read complete instructions/literature along with all the possible adverse reactions/side effects for all the Medicines you take and that have been prescribed to you. Take any new Medicines after you have completely understood and accpet all the possible adverse reactions/side effects.   Please note  You were cared for by a hospitalist during your hospital stay. If you have any  questions about your discharge medications or the care you received while you were in the hospital after you are discharged, you can call the unit and asked to speak with the hospitalist on call if the hospitalist that took care of you is not available. Once you are discharged, your primary care physician will handle any further medical issues. Please note that NO REFILLS for any discharge medications will be authorized once you are discharged, as it is imperative that you return to your primary care physician (or establish a relationship with a primary care physician if you do not have one) for your aftercare needs so that they can reassess your need for medications and monitor your lab values.    On the day of Discharge:  VITAL SIGNS:   Blood pressure 116/78, pulse 62, temperature 98.3 F (36.8 C), temperature source Oral, resp. rate 12, height 5\' 6"  (1.676 m), weight 52 kg (114 lb 12 oz), SpO2 100 %.  PHYSICAL EXAMINATION:    GENERAL:  61 y.o.-year-old Chronically ill appearing patient lying in the bed with no acute distress.  EYES: Pupils equal, round, reactive to light and accommodation. No scleral icterus. Extraocular muscles intact.  HEENT: Head atraumatic, normocephalic. Oropharynx and nasopharynx clear.  NECK:  Supple, no jugular venous distention. No thyroid enlargement, no tenderness.  LUNGS: Normal breath sounds bilaterally, no wheezing, rales,rhonchi or crepitation. No use of accessory muscles of respiration.  CARDIOVASCULAR: S1, S2 normal. No rubs, or gallops. 2/6 systolic murmur is present ABDOMEN: Soft, nontender, nondistended. Bowel sounds present. No organomegaly or mass.  EXTREMITIES: No  cyanosis, or clubbing. 1+ pedal edema noted NEUROLOGIC: Cranial nerves II through XII are intact. Muscle strength 5/5 in both upper extremities, 3/5 in lower extremities. Sensation intact. Gait not checked.  PSYCHIATRIC: The patient is alert and oriented x 2-3, emotionally upset and poor  judgment.  SKIN: No obvious rash, lesion, or ulcer.    DATA REVIEW:   CBC  Recent Labs Lab 03/25/17 0631  WBC 7.7  HGB 10.1*  HCT 30.2*  PLT 187    Chemistries   Recent Labs Lab 03/22/17 1421  03/25/17 0631  NA 145  < > 147*  K 3.5  < > 3.5  CL 115*  < > 118*  CO2 23  < > 23  GLUCOSE 78  < > 74  BUN 31*  < > 21*  CREATININE 1.25*  < > 1.13*  CALCIUM 8.1*  < > 8.5*  AST 27  --   --   ALT 16  --   --   ALKPHOS 114  --   --   BILITOT 0.8  --   --   < > = values in this interval not displayed.   Microbiology Results  Results for orders placed or performed during the hospital encounter of 03/22/17  Urine culture     Status: Abnormal   Collection Time: 03/23/17  8:38 PM  Result Value Ref Range Status   Specimen Description URINE, RANDOM  Final   Special Requests NONE  Final   Culture MULTIPLE SPECIES PRESENT, SUGGEST RECOLLECTION (A)  Final   Report Status 03/25/2017 FINAL  Final    RADIOLOGY:  No results found.   Management plans discussed with the patient, family and they are in agreement.  CODE STATUS:     Code Status Orders        Start     Ordered   03/22/17 1611  Full code  Continuous     03/22/17 1612    Code Status History    Date Active Date Inactive Code Status Order ID Comments User Context   01/05/2017  4:48 PM 01/06/2017  9:37 PM Full Code [id number]  Demetrios Loll, MD Inpatient   05/29/2016  4:24 PM 05/30/2016  8:01 AM Full Code [id number]  Idelle Crouch, MD Inpatient   08/23/2015  4:14 PM 08/27/2015  7:54 PM Full Code [id number]  Fritzi Mandes, MD ED   05/23/2015 11:00 AM 05/26/2015  3:23 PM Full Code [id number]  Henreitta Leber, MD Inpatient      TOTAL TIME TAKING CARE OF THIS PATIENT: 38 minutes.    Gladstone Lighter M.D on 03/25/2017 at 2:57 PM  Between 7am to 6pm - Pager - [phone]  After 6pm go to [email or web address] - Proofreader  [facility]  Office  [phone]  CC: Primary care physician;  System, Pcp Not In   Note: This dictation was prepared with Dragon dictation along with smaller phrase technology. Any transcriptional errors that result from this process are unintentional.

## 2017-03-25 NOTE — Progress Notes (Addendum)
Patient is opposed to a rehab setting because no one will take care of her dogs, and she doesn't want to be away from her dogs.

## 2017-03-25 NOTE — Clinical Social Work Note (Signed)
CSW visited patient at bedside to discuss discharge planning and PT recommendation for SNF. The patient declined SNF and thanked CSW for assistance. She indicated that she is willing to have home health. The CSW informed the RNCM. CSW signing off.  Santiago Bumpers, MSW, Latanya Presser [phone]

## 2017-03-26 LAB — BASIC METABOLIC PANEL
Anion gap: 2 — ABNORMAL LOW (ref 5–15)
BUN: 19 mg/dL (ref 6–20)
CALCIUM: 8.3 mg/dL — AB (ref 8.9–10.3)
CO2: 20 mmol/L — AB (ref 22–32)
CREATININE: 1.01 mg/dL — AB (ref 0.44–1.00)
Chloride: 122 mmol/L — ABNORMAL HIGH (ref 101–111)
GFR calc Af Amer: >60 mL/min (ref >60)
Glucose, Bld: 95 mg/dL (ref 65–99)
Potassium: 4 mmol/L (ref 3.5–5.1)
Sodium: 144 mmol/L (ref 135–145)

## 2017-03-26 LAB — RPR: RPR: NONREACTIVE

## 2017-03-26 LAB — GLUCOSE, CAPILLARY: Glucose-Capillary: 134 mg/dL — ABNORMAL HIGH (ref 65–99)

## 2017-03-26 MED ORDER — TIZANIDINE HCL 4 MG PO TABS
2.0000 mg | ORAL_TABLET | Freq: Three times a day (TID) | ORAL | Status: DC
  Administered 2017-03-26 – 2017-03-30 (×10): 2 mg via ORAL
  Filled 2017-03-26 (×13): qty 1

## 2017-03-26 NOTE — Progress Notes (Signed)
Notified MD of protrusion from rectum. MD will come to see pt. Will continue to monitor and assess.

## 2017-03-26 NOTE — NC FL2 (Signed)
[place] LEVEL OF CARE SCREENING TOOL     IDENTIFICATION  Patient Name: Lindsay [name] Birthdate: Mar 24, 1956 Sex: female Admission Date (Current Location): 03/22/2017  [facility] and [place] Number:  Engineering geologist and Address:  [facility], [address] [place], [place] [address]      Provider Number: [id number]  Attending Physician Name and Address:  Gladstone Lighter, MD  Relative Name and Phone Number:       Current Level of Care: Hospital Recommended Level of Care: [place] Prior Approval Number:    Date Approved/Denied:   PASRR Number:    Discharge Plan: SNF    Current Diagnoses: Patient Active Problem List   Diagnosis Date Noted  . Anemia 03/23/2017  . Right arm weakness 01/05/2017  . Chest pain 05/29/2016  . Elevated troponin 05/29/2016  . Atrial flutter ([place]) 05/29/2016  . Postural hypotension   . Moderate mitral regurgitation   . PAF (paroxysmal atrial fibrillation) ([place])   . Malnutrition ([place])   . S/P gastric bypass   . Fibromyalgia   . Peripheral neuropathy   . NICM (nonischemic cardiomyopathy) ([place])   . Chronic systolic CHF (congestive heart failure) ([place])   . Recurrent major depressive disorder, in partial remission ([place])   . Major depression 08/26/2015  . Weakness of both legs   . Absolute anemia   . Pain in the chest   . Orthostatic hypotension   . Paroxysmal atrial fibrillation (HCC)   . Syncope and collapse   . Paroxysmal A-fib ( [place])   . Atrial fibrillation ([place]) 05/24/2015  . Abdominal pain 05/23/2015  . C. difficile colitis 12/12/2011  . Chest pain 11/11/2011  . Syncope 11/11/2011  . Hypotension 11/11/2011  . Cardiomyopathy 11/11/2011  . AF (atrial fibrillation) ([place]) 11/11/2011    Orientation RESPIRATION BLADDER Height & Weight     Self, Time, Situation, Place  Normal Continent Weight: 114 lb 12 oz (52 kg) Height:  5\' 6"  (167.6 cm)  BEHAVIORAL SYMPTOMS/MOOD  NEUROLOGICAL BOWEL NUTRITION STATUS      Continent    AMBULATORY STATUS COMMUNICATION OF NEEDS Skin   Extensive Assist Verbally Normal                       Personal Care Assistance Level of Assistance  Bathing, Feeding, Dressing Bathing Assistance: Limited assistance Feeding assistance: Independent Dressing Assistance: Limited assistance     Functional Limitations Info             SPECIAL CARE FACTORS FREQUENCY  PT (By licensed PT)     PT Frequency: Up to 5X per day, 5 days per week              Contractures Contractures Info: Present    Additional Factors Info  Allergies   Allergies Info: Shrimp/Shellfish Allergy           Current Medications (03/26/2017):  This is the current hospital active medication list Current Facility-Administered Medications  Medication Dose Route Frequency Provider Last Rate Last Dose  . acetaminophen (TYLENOL) tablet 650 mg  650 mg Oral Q6H PRN Epifanio Lesches, MD   650 mg at 03/23/17 4174   Or  . acetaminophen (TYLENOL) suppository 650 mg  650 mg Rectal Q6H PRN Epifanio Lesches, MD      . anastrozole (ARIMIDEX) tablet 1 mg  1 mg Oral Daily Epifanio Lesches, MD   1 mg at 03/26/17 1024  . aspirin EC tablet 81 mg  81 mg  Oral Daily Epifanio Lesches, MD   81 mg at 03/26/17 1025  . bisacodyl (DULCOLAX) EC tablet 5 mg  5 mg Oral Daily PRN Epifanio Lesches, MD      . cefTRIAXone (ROCEPHIN) 1 g in dextrose 5 % 50 mL IVPB  1 g Intravenous Q24H Loletha Grayer, MD 100 mL/hr at 03/26/17 0826 1 g at 03/26/17 0826  . cholecalciferol (VITAMIN D) tablet 1,000 Units  1,000 Units Oral Daily Epifanio Lesches, MD   1,000 Units at 03/26/17 1025  . clonazePAM (KLONOPIN) tablet 0.5 mg  0.5 mg Oral QHS PRN Epifanio Lesches, MD      . docusate sodium (COLACE) capsule 100 mg  100 mg Oral BID Epifanio Lesches, MD   100 mg at 03/26/17 1025  . enoxaparin (LOVENOX) injection 40 mg  40 mg Subcutaneous Q24H Loletha Grayer, MD    40 mg at 03/25/17 1701  . flecainide (TAMBOCOR) tablet 50 mg  50 mg Oral Q12H Epifanio Lesches, MD   50 mg at 03/26/17 0827  . fludrocortisone (FLORINEF) tablet 0.2 mg  0.2 mg Oral BID Loletha Grayer, MD   0.2 mg at 03/26/17 0827  . fluticasone (FLONASE) 50 MCG/ACT nasal spray 1 spray  1 spray Each Nare Daily Epifanio Lesches, MD   1 spray at 03/26/17 1024  . loperamide (IMODIUM) capsule 2 mg  2 mg Oral QID PRN Epifanio Lesches, MD      . loratadine (CLARITIN) tablet 10 mg  10 mg Oral Daily PRN Epifanio Lesches, MD      . midodrine (PROAMATINE) tablet 10 mg  10 mg Oral TID WC Loletha Grayer, MD   10 mg at 03/26/17 0826  . mirtazapine (REMERON) tablet 15 mg  15 mg Oral QHS Epifanio Lesches, MD   15 mg at 03/25/17 2226  . morphine (MS CONTIN) 12 hr tablet 15 mg  15 mg Oral Q8H Loletha Grayer, MD   15 mg at 03/26/17 0617  . ondansetron (ZOFRAN) tablet 4 mg  4 mg Oral Q6H PRN Epifanio Lesches, MD       Or  . ondansetron (ZOFRAN) injection 4 mg  4 mg Intravenous Q6H PRN Epifanio Lesches, MD   4 mg at 03/25/17 1859  . oxyCODONE (Oxy IR/ROXICODONE) immediate release tablet 15 mg  15 mg Oral Q6H PRN Epifanio Lesches, MD   15 mg at 03/26/17 0826  . pantoprazole (PROTONIX) EC tablet 40 mg  40 mg Oral Daily Epifanio Lesches, MD   40 mg at 03/26/17 1025  . potassium chloride SA (K-DUR,KLOR-CON) CR tablet 20 mEq  20 mEq Oral BID Loletha Grayer, MD   20 mEq at 03/26/17 1025  . pregabalin (LYRICA) capsule 150 mg  150 mg Oral TID Epifanio Lesches, MD   150 mg at 03/26/17 1024  . sertraline (ZOLOFT) tablet 150 mg  150 mg Oral QHS Epifanio Lesches, MD   150 mg at 03/25/17 2226  . tiZANidine (ZANAFLEX) tablet 2 mg  2 mg Oral TID Gladstone Lighter, MD   2 mg at 03/26/17 1024  . traZODone (DESYREL) tablet 25 mg  25 mg Oral QHS PRN Epifanio Lesches, MD      . vitamin B-12 (CYANOCOBALAMIN) tablet 1,000 mcg  1,000 mcg Oral Daily Epifanio Lesches, MD   1,000  mcg at 03/26/17 1024  . vitamin C (ASCORBIC ACID) tablet 500 mg  500 mg Oral Daily Epifanio Lesches, MD   500 mg at 03/26/17 1025     Discharge Medications: Please see discharge summary for a list  of discharge medications.  Relevant Imaging Results:  Relevant Lab Results:   Additional Information SS#[id number]  Zettie Pho, LCSW

## 2017-03-26 NOTE — Progress Notes (Signed)
[place] [facility] [place] NAME: Lindsay [name]    MR#:  [id number]  DATE OF BIRTH:  [birth year]  SUBJECTIVE:  CHIEF COMPLAINT:   Chief Complaint  Patient presents with  . Fall   -Feels some better today. Blood pressure has improved. -Agreeable for rehabilitation  REVIEW OF SYSTEMS:  Review of Systems  Constitutional: Negative for chills, fever and malaise/fatigue.  HENT: Negative for congestion, ear discharge, hearing loss and nosebleeds.   Eyes: Negative for blurred vision.  Respiratory: Negative for cough, shortness of breath and wheezing.   Cardiovascular: Positive for leg swelling. Negative for chest pain and palpitations.  Gastrointestinal: Negative for abdominal pain, constipation, diarrhea, nausea and vomiting.  Genitourinary: Negative for dysuria.  Musculoskeletal: Positive for joint pain and myalgias.  Neurological: Negative for dizziness, sensory change, speech change, focal weakness, seizures and headaches.    DRUG ALLERGIES:   Allergies  Allergen Reactions  . Shrimp [Shellfish Allergy] Anaphylaxis    VITALS:  Blood pressure 117/78, pulse (!) 59, temperature 98.3 F (36.8 C), temperature source Oral, resp. rate 16, height 5\' 6"  (1.676 m), weight 52 kg (114 lb 12 oz), SpO2 100 %.  PHYSICAL EXAMINATION:  Physical Exam  GENERAL:  61 y.o.-year-old Chronically ill appearing patient lying in the bed with no acute distress.  EYES: Pupils equal, round, reactive to light and accommodation. No scleral icterus. Extraocular muscles intact.  HEENT: Head atraumatic, normocephalic. Oropharynx and nasopharynx clear.  NECK:  Supple, no jugular venous distention. No thyroid enlargement, no tenderness.  LUNGS: Normal breath sounds bilaterally, no wheezing, rales,rhonchi or crepitation. No use of accessory muscles of respiration.  CARDIOVASCULAR: S1, S2 normal. No rubs, or gallops. 2/6 systolic murmur is present ABDOMEN: Soft, nontender,  nondistended. Bowel sounds present. No organomegaly or mass.  EXTREMITIES: No  cyanosis, or clubbing. 1+ pedal edema noted NEUROLOGIC: Cranial nerves II through XII are intact. Muscle strength 5/5 in both upper extremities, 3/5 in lower extremities. Sensation intact. Gait not checked.  PSYCHIATRIC: The patient is alert and oriented x 2-3,  poor judgment.  SKIN: No obvious rash, lesion, or ulcer.    LABORATORY PANEL:   CBC  Recent Labs Lab 03/25/17 0631  WBC 7.7  HGB 10.1*  HCT 30.2*  PLT 187   ------------------------------------------------------------------------------------------------------------------  Chemistries   Recent Labs Lab 03/22/17 1421  03/26/17 0536  NA 145  < > 144  K 3.5  < > 4.0  CL 115*  < > 122*  CO2 23  < > 20*  GLUCOSE 78  < > 95  BUN 31*  < > 19  CREATININE 1.25*  < > 1.01*  CALCIUM 8.1*  < > 8.3*  AST 27  --   --   ALT 16  --   --   ALKPHOS 114  --   --   BILITOT 0.8  --   --   < > = values in this interval not displayed. ------------------------------------------------------------------------------------------------------------------  Cardiac Enzymes  Recent Labs Lab 03/22/17 1421  TROPONINI <0.03   ------------------------------------------------------------------------------------------------------------------  RADIOLOGY:  Mr Cervical Spine Wo Contrast  Result Date: 03/24/2017 CLINICAL DATA:  Right wrist drop. Decreased sensation in hands and feet. History of disc herniation at C6-7. Recent brachial plexus MRI at [facility], 02/23/2017, reportedly negative. EXAM: MRI CERVICAL SPINE WITHOUT CONTRAST TECHNIQUE: Multiplanar, multisequence MR imaging of the cervical spine was performed. No intravenous contrast was administered. COMPARISON:  None. FINDINGS: Alignment: Physiologic. Vertebrae: No fracture, evidence of discitis, or bone  lesion. Cord: There is an unexpected rounded T2 hyperintensity in the dorsal midline cord, which appears somewhat  thinned. This central, singular, rounded appearance is not typical of subacute combined degeneration, but would consider B12 and copper deficiency in this patient with history of mouth nutrition and gastric bypass surgery. No associated expansion to suggest this is acute demyelination or infectious myelitis. There is no historical indication of HIV. Posterior Fossa, vertebral arteries, paraspinal tissues: No acute finding. Lacune in the pons which was also seen on prior MRI. Disc levels: C5-6 disc narrowing and broad central protrusion which contacts but does not compress the cord. No foraminal impingement to explain right arm symptoms. IMPRESSION: 1. Unexpected dorsal cord signal throughout the cervical levels. The appearance is not typical for subacute combined degeneration, but would consider T12 and copper deficiency in this patient with history of malnutrition and gastric bypass surgery. Please see further discussion above. 2. C6-7 disc protrusion with mild spinal stenosis. No foraminal impingement to explain right arm symptoms. Electronically Signed   By: Monte Fantasia M.D.   On: 03/24/2017 13:29   Mr Hip Right Wo Contrast  Result Date: 03/24/2017 CLINICAL DATA:  Presented to the ER after a fall out of her wheelchair. Patient states she has been having many recent falls. She was found to be anemic hypotensive and was admitted to the hospitalist service. At the time of admission she was also complaining of right hip pain. Patient normally receives her [facility] through [facility]. Patient today states that she was told by her primary care physician that she has a hip fracture that needs immediate attention. During my examination the patient states she is not currently having any right hip pain. EXAM: MR OF THE RIGHT HIP WITHOUT CONTRAST TECHNIQUE: Multiplanar, multisequence MR imaging was performed. No intravenous contrast was administered. COMPARISON:  None. FINDINGS: Bones: No left hip fracture, dislocation or  avascular necrosis. No right hip fracture or dislocation. Subchondral serpiginous signal abnormality in the superior right femoral head with severe surrounding marrow edema most consistent with avascular necrosis. No articular surface collapse. No aggressive osseous lesion. Normal sacrum and sacroiliac joints. No SI joint widening or erosive changes. Moderate bilateral facet arthropathy at L5-S1. Articular cartilage and labrum Articular cartilage: High-grade partial-thickness cartilage loss with areas of full-thickness cartilage loss of the right femoral head and acetabulum. Mild partial-thickness cartilage loss of the left hip. Labrum: Grossly intact, but evaluation is limited by lack of intraarticular fluid. Joint or bursal effusion Joint effusion: Large right hip joint effusion. No left hip joint effusion. No SI joint effusion. Bursae:  No bursa formation. Muscles and tendons Flexors: Normal. Extensors: Normal. Abductors: Normal. Adductors: Normal. Gluteals: Normal. Hamstrings: Normal. Other findings Miscellaneous: No pelvic free fluid. No fluid collection or hematoma. No inguinal lymphadenopathy. No inguinal hernia. IMPRESSION: 1. Avascular necrosis of the right femoral head with severe surrounding marrow edema involving the femoral head and neck and a large joint effusion. No articular surface collapse. 2. 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 3. Mild osteoarthritis of the left hip. 4. No hip fracture or dislocation. Electronically Signed   By: Kathreen Devoid   On: 03/24/2017 13:14    EKG:   Orders placed or performed during the hospital encounter of 03/22/17  . ED EKG  . ED EKG    ASSESSMENT AND PLAN:   61 year old female with multiple medical problems including chronic systolic CHF with last EF of 45-50%, anemia of chronic disease, recent history of C. difficile colitis, history of  DVT status post IVC, fibromyalgia, right hip avascular necrosis, right wrist drop, non-ischemic  cardiomyopathy, malnutrition  presents to the hospital secondary to fall and syncope.  #1 falls and weakness-secondary to significant orthostatic hypotension. -Started on midodrine, also on Florinef.  -Continue checking orthostatics. Much improved now, -Physical therapy and OT consult recommended rehabilitation at discharge.  #2 right wrist drop-MRI of C-spine did not show any acute cardiac compression. Appreciate neurology consult. -Currently in a splint. Outpatient NCS/EMG study is recommended.  #3 right hip avascular necrosis-appreciate orthopedic consult. Weightbearing as tolerated recommended. -Non-urgent surgery as outpatient.  #4 history of paroxysmal atrial fibrillation-rate is controlled. Continue flecainide. Not a candidate for anticoagulation. Currently on aspirin.  #5 peripheral neuropathy has been on Lyrica. Has muscle spasms. Zanaflex is restarted at a lower dose.  #6 congestive heart failure-chronic systolic dysfunction. Not in any acute exacerbation. If needed IV fluids will be given.  monitor carefully.  #7 history of breast cancer-continue anastrozole  #8-palliative care has been consulted to address goals of treatment.  #9 DVT prophylaxis-on Lovenox   All the records are reviewed and case discussed with Care Management/Social Workerr. Management plans discussed with the patient, family and they are in agreement.  CODE STATUS: Full code  TOTAL TIME TAKING CARE OF THIS PATIENT: 38 minutes.   POSSIBLE D/C TOMORROW, DEPENDING ON CLINICAL CONDITION.   Gladstone Lighter M.D on 03/26/2017 at 9:36 AM  Between 7am to 6pm - Pager - [phone]  After 6pm go to [email or web address] - password [email or web address] [facility]  Office  [phone]  CC: Primary care physician; System, Pcp Not In

## 2017-03-26 NOTE — Clinical Social Work Note (Addendum)
Clinical Social Work Assessment  Patient Details  Name: Lindsay [name] MRN: [id number] Date of Birth: 04-12-1956  Date of referral:  03/26/17               Reason for consult:  Facility Placement                Permission sought to share information with:  Facility Art therapist granted to share information::  Yes, Verbal Permission Granted  Name::        Agency::     Relationship::     Contact Information:     Housing/Transportation Living arrangements for the past 2 months:  Apartment Source of Information:  Patient Patient Interpreter Needed:  None Criminal Activity/Legal Involvement Pertinent to Current Situation/Hospitalization:  No - Comment as needed Significant Relationships:  Other Family Members, Community Support, [place] Lives with:  Self Do you feel safe going back to the place where you live?  Yes Need for family participation in patient care:  No (Coment)  Care giving concerns:  PT recommendation for STR   Social Worker assessment / plan:  CSW met with the patient at bedside to discuss discharge planning. At that time, the patient declined SNF; however, she later reported to the attending MD that she would like to pursue SNF. The CSW delivered the list of options and prepared her referral once informed that the patient has changed her mind.  The patient at baseline lives in an apartment and has a Optician, dispensing. The patient reports strong support from her family and the community.  PASRR change of status request is pending nurse review.   Employment status:  Retired Forensic scientist:  [employer] PT Recommendations:  [place] / Referral to community resources:  [place]  Patient/Family's Response to care:  The patient thanked the CSW for assistance.  Patient/Family's Understanding of and Emotional Response to Diagnosis, Current Treatment, and Prognosis:  The patient has come to agree and  understand her changing level of care needs.   Emotional Assessment Appearance:  Appears older than stated age Attitude/Demeanor/Rapport:   (Pleasant) Affect (typically observed):  Appropriate, Pleasant Orientation:  Oriented to Self, Oriented to Place, Oriented to  Time, Oriented to Situation Alcohol / Substance use:  Never Used Psych involvement (Current and /or in the community):  No (Comment)  Discharge Needs  Concerns to be addressed:  Discharge Planning Concerns, Care Coordination Readmission within the last 30 days:  No Current discharge risk:  Chronically ill Barriers to Discharge:  Continued Medical Work up   [employer], LCSW 03/26/2017, 10:12 AM

## 2017-03-27 DIAGNOSIS — K648 Other hemorrhoids: Secondary | ICD-10-CM

## 2017-03-27 DIAGNOSIS — F332 Major depressive disorder, recurrent severe without psychotic features: Secondary | ICD-10-CM

## 2017-03-27 DIAGNOSIS — Z7189 Other specified counseling: Secondary | ICD-10-CM

## 2017-03-27 DIAGNOSIS — Z515 Encounter for palliative care: Secondary | ICD-10-CM

## 2017-03-27 LAB — RETICULOCYTES
RBC.: 2.84 MIL/uL — ABNORMAL LOW (ref 3.80–5.20)
Retic Count, Absolute: 99.4 10*3/uL (ref 19.0–183.0)
Retic Ct Pct: 3.5 % — ABNORMAL HIGH (ref 0.4–3.1)

## 2017-03-27 LAB — CBC
HCT: 26.6 % — ABNORMAL LOW (ref 35.0–47.0)
Hemoglobin: 9 g/dL — ABNORMAL LOW (ref 12.0–16.0)
MCH: 32.3 pg (ref 26.0–34.0)
MCHC: 33.8 g/dL (ref 32.0–36.0)
MCV: 95.6 fL (ref 80.0–100.0)
PLATELETS: 209 10*3/uL (ref 150–440)
RBC: 2.78 MIL/uL — ABNORMAL LOW (ref 3.80–5.20)
RDW: 16.5 % — AB (ref 11.5–14.5)
WBC: 6.1 10*3/uL (ref 3.6–11.0)

## 2017-03-27 LAB — IRON AND TIBC: IRON: 69 ug/dL (ref 28–170)

## 2017-03-27 LAB — FOLATE: FOLATE: 12.5 ng/mL (ref >5.9)

## 2017-03-27 LAB — FERRITIN: Ferritin: 201 ng/mL (ref 11–307)

## 2017-03-27 LAB — GLUCOSE, CAPILLARY: GLUCOSE-CAPILLARY: 82 mg/dL (ref 65–99)

## 2017-03-27 LAB — HEMOGLOBIN: HEMOGLOBIN: 8.9 g/dL — AB (ref 12.0–16.0)

## 2017-03-27 MED ORDER — MIRTAZAPINE 15 MG PO TABS
30.0000 mg | ORAL_TABLET | Freq: Every day | ORAL | Status: DC
  Administered 2017-03-27 – 2017-04-01 (×6): 30 mg via ORAL
  Filled 2017-03-27 (×6): qty 2

## 2017-03-27 MED ORDER — PREGABALIN 75 MG PO CAPS
150.0000 mg | ORAL_CAPSULE | Freq: Two times a day (BID) | ORAL | Status: DC
  Administered 2017-03-27 – 2017-04-02 (×11): 150 mg via ORAL
  Filled 2017-03-27 (×2): qty 2
  Filled 2017-03-27: qty 3
  Filled 2017-03-27 (×9): qty 2

## 2017-03-27 MED ORDER — SODIUM CHLORIDE 0.9 % IV SOLN: INTRAVENOUS | Status: DC

## 2017-03-27 MED ORDER — HYDROCORTISONE ACETATE 25 MG RE SUPP
25.0000 mg | Freq: Two times a day (BID) | RECTAL | Status: DC | PRN
  Filled 2017-03-27: qty 1

## 2017-03-27 MED ORDER — SODIUM CHLORIDE 0.9 % IV SOLN
INTRAVENOUS | Status: DC
  Administered 2017-03-27: 10:00:00 via INTRAVENOUS

## 2017-03-27 MED ORDER — PANTOPRAZOLE SODIUM 40 MG IV SOLR
40.0000 mg | Freq: Two times a day (BID) | INTRAVENOUS | Status: DC
  Administered 2017-03-27 – 2017-03-28 (×3): 40 mg via INTRAVENOUS
  Filled 2017-03-27 (×3): qty 40

## 2017-03-27 NOTE — Progress Notes (Addendum)
MD at bedside with Rn. MD stated prolasped rectum. MD attempted x 1 to push back in with no results. MD proceeded to place GI consult. Will continue to monitor.    Time is 03-26-17 @ 2348 MD in with pt and rn

## 2017-03-27 NOTE — Clinical Social Work Note (Signed)
[place] number has been received, [phone], however patient is now stating she would rather go home with home health.  CSW updated case manager, CSW will continue to follow patient's progress in case she changes her mind again.  Jones Broom. Norval Morton, MSW, [place]  03/27/2017 4:49 PM

## 2017-03-27 NOTE — Consult Note (Signed)
[facility] Consult   Reason for Consult:  Consult for 61 year old woman with multiple chronic medical problems regarding her depression Referring Physician:  Tressia Miners Patient Identification: Lindsay [name] MRN:  [id number] Principal Diagnosis: Severe recurrent major depression without psychotic features [employer]) Diagnosis:   Patient Active Problem List   Diagnosis Date Noted  . Severe recurrent major depression without psychotic features ([place]) [F33.2] 03/27/2017  . Palliative care by specialist [Z51.5]   . Goals of care, counseling/discussion [Z71.89]   . Hemorrhoid prolapse [K64.8]   . Anemia [D64.9] 03/23/2017  . Right arm weakness [R29.898] 01/05/2017  . Chest pain [R07.9] 05/29/2016  . Elevated troponin [R74.8] 05/29/2016  . Atrial flutter ([place]) [I48.92] 05/29/2016  . Postural hypotension [I95.1]   . Moderate mitral regurgitation [I34.0]   . PAF (paroxysmal atrial fibrillation) ([place]) [I48.0]   . Malnutrition ([place]) [E46]   . S/P gastric bypass [Z98.84]   . Fibromyalgia [M79.7]   . Peripheral neuropathy [G62.9]   . NICM (nonischemic cardiomyopathy) ([place]) [I42.8]   . Chronic systolic CHF (congestive heart failure) ([place]) [I50.22]   . Recurrent major depressive disorder, in partial remission ([place]) [F33.41]   . Major depression [F32.9] 08/26/2015  . Weakness of both legs [R29.898]   . Absolute anemia [D64.9]   . Pain in the chest [R07.9]   . Orthostatic hypotension [I95.1]   . Paroxysmal atrial fibrillation ([facility]) [I48.0]   . Syncope and collapse [R55]   . Paroxysmal A-fib ([place]) [I48.0]   . Atrial fibrillation ([place]) [I48.91] 05/24/2015  . Abdominal pain [R10.9] 05/23/2015  . C. difficile colitis [A04.72] 12/12/2011  . Chest pain [786.5] 11/11/2011  . Syncope [R55] 11/11/2011  . Hypotension [458] 11/11/2011  . Cardiomyopathy [425] 11/11/2011  . AF (atrial fibrillation) ([place]) [I48.91] 11/11/2011    Total Time spent with patient: 1 hour  Subjective:   Lindsay [name] is a 61 y.o. female patient admitted with "I feel pretty bad".  HPI:  Patient interviewed chart reviewed. Patient known to me from a couple of previous encounters. 61 year old woman with multiple chronic medical problems currently in the hospital for multiple issues but precipitated by a fall at home. Treatment team notes that the patient remains very depressed looking. On interview today the patient says that she is sad down and depressed most of the time. Can't think of anything positive in her life that she enjoys except possibly being with her dogs. Sleep is chronically poor. Energy level is chronically poor. Admits that she rarely gets out of bed at home and less her home health aide assists her. Patient claims that she has been eating okay but it looks like she's gained some weight since coming into the hospital. Possibly could've been not eating well and not drinking well before admission. Claims that she is compliant with her medicine. Patient has chronic suicidal thoughts without any intention to do anything to hurt her self. Denies hallucinations but says that recently she's been noticing that she talks to herself more. She is on 15 mg of mirtazapine at night and 150 mg of sertraline in the day which has been her medicine for over a year.  Social history: Lives by herself in an apartment. Has home health pretty much daily. Patient complains about her mother's chronic illness. She makes it sound like this is an acute change although looking back this is the same concern she had 2 years ago.  Medical history: Multiple medical problems with probably not very good self-care much of the time. History of  recovery from cancer in the past. Has had several hospitalizations for falls.  Substance abuse history: Denies alcohol or drug use  Past Psychiatric History: Long-standing noted depression that has been treated with medicine as an outpatient with not very much clear improvement. I noticed that  on talking to her today this is probably about the same presentation she had a couple years ago. Nevertheless she has never seen a psychiatrist never been in a psychiatric hospital and has no history of suicide attempts or psychosis. Antidepressants in the past including Cymbalta.  Risk to Self: Is patient at risk for suicide?: No (Denies at this time. ) Risk to Others:   Prior Inpatient Therapy:   Prior Outpatient Therapy:    Past Medical History:  Past Medical History:  Diagnosis Date  . Anemia of chronic disease   . C. difficile diarrhea 10/2011  . Cancer ([place])   . Chronic cough   . Chronic systolic CHF (congestive heart failure) ([place])    a. echo 05/2015: EF 45-50%, nl wall motion, mod MR, nl PASP  . Depression   . Depression   . Fibromyalgia   . History of DVT (deep vein thrombosis)    a. s/p IVC filter 2014  . HX: breast cancer    a. right-side; b. s/p lumpectomy & chemo w/ Docetaxol, cytoxan, adriamycin  . Kidney stones 2013  . Malnutrition ([place])    a. chronic diarrhea s/p most meals   . Moderate mitral regurgitation   . Muscle spasm   . NICM (nonischemic cardiomyopathy) ([place])    a. Lexiscan 08/2015: no sig ischemia, no ECG changes concerning for ischemia (baseline abnormal T wave along anterolateral leads), EF 61%, low risk scan  . Orthostatic hypotension   . PAF (paroxysmal atrial fibrillation) ([place])    a. not on long term, full-dose anticoagulation since 2011, had high INR at that time 2/2 possible malnutition, has been off anticoagulation since; b. CHADS2VASc at least 2 (CHF, female); c. 48 hr Holter 09/2015 NSR w/ rare PACs, no significant PVCs, periods of sinus tachycarida with average heart rate typically in the 60-100 range. No sig arrhythmia. No Afib seen.   . 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 Postural hypotension   . S/P gastric bypass   . Vitamin D deficiency     Past Surgical History:  Procedure Laterality Date  . ABDOMINAL HYSTERECTOMY    .  BREAST LUMPECTOMY     right @ [facility]  . ESOPHAGOGASTRODUODENOSCOPY (EGD) WITH PROPOFOL N/A 05/25/2015   Procedure: ESOPHAGOGASTRODUODENOSCOPY (EGD) WITH PROPOFOL;  Surgeon: Manya Silvas, MD;  Location: [facility] ENDOSCOPY;  Service: Endoscopy;  Laterality: N/A;  . LITHOTRIPSY  jan 2013   Family History:  Family History  Problem Relation Age of Onset  . CAD Father        MI  . Diabetes Mellitus II Father   . Hypertension Father   . Diabetes Mellitus II Sister    Family Psychiatric  History: None known Social History:  History  Alcohol Use No     History  Drug Use No    Social History   Social History  . Marital status: Widowed    Spouse name: N/A  . Number of children: N/A  . Years of education: N/A   Social History Main Topics  . Smoking status: Former Smoker    Packs/day: 0.25    Years: 1.00    Types: Cigarettes    Quit date: 11/10/1985  . Smokeless tobacco:  Never Used  . Alcohol use No  . Drug use: No  . Sexual activity: Not Asked   Other Topics Concern  . None   Social History Narrative  . None   Additional Social History:    Allergies:   Allergies  Allergen Reactions  . Shrimp [Shellfish Allergy] Anaphylaxis    Labs:  Results for orders placed or performed during the hospital encounter of 03/22/17 (from the past 48 hour(s))  Basic metabolic panel     Status: Abnormal   Collection Time: 03/26/17  5:36 AM  Result Value Ref Range   Sodium 144 135 - 145 mmol/L    Comment: ELECTROLYTES REPEATED [facility]    Potassium 4.0 3.5 - 5.1 mmol/L   Chloride 122 (H) 101 - 111 mmol/L   CO2 20 (L) 22 - 32 mmol/L   Glucose, Bld 95 65 - 99 mg/dL   BUN 19 6 - 20 mg/dL   Creatinine, Ser 1.01 (H) 0.44 - 1.00 mg/dL   Calcium 8.3 (L) 8.9 - 10.3 mg/dL   GFR calc non Af Amer >60 >60 mL/min   GFR calc Af Amer >60 >60 mL/min    Comment: (NOTE) The eGFR has been calculated using the CKD EPI equation. This calculation has not been validated in all clinical situations. eGFR's  persistently <60 mL/min signify possible Chronic Kidney Disease.    Anion gap 2 (L) 5 - 15  Glucose, capillary     Status: Abnormal   Collection Time: 03/26/17  7:52 AM  Result Value Ref Range   Glucose-Capillary 134 (H) 65 - 99 mg/dL  Glucose, capillary     Status: None   Collection Time: 03/27/17  7:47 AM  Result Value Ref Range   Glucose-Capillary 82 65 - 99 mg/dL  CBC     Status: Abnormal   Collection Time: 03/27/17  9:05 AM  Result Value Ref Range   WBC 6.1 3.6 - 11.0 K/uL   RBC 2.78 (L) 3.80 - 5.20 MIL/uL   Hemoglobin 9.0 (L) 12.0 - 16.0 g/dL   HCT 26.6 (L) 35.0 - 47.0 %   MCV 95.6 80.0 - 100.0 fL   MCH 32.3 26.0 - 34.0 pg   MCHC 33.8 32.0 - 36.0 g/dL   RDW 16.5 (H) 11.5 - 14.5 %   Platelets 209 150 - 440 K/uL  Hemoglobin     Status: Abnormal   Collection Time: 03/27/17 10:25 AM  Result Value Ref Range   Hemoglobin 8.9 (L) 12.0 - 16.0 g/dL  Folate     Status: None   Collection Time: 03/27/17 10:25 AM  Result Value Ref Range   Folate 12.5 >5.9 ng/mL  Iron and TIBC     Status: None   Collection Time: 03/27/17 10:25 AM  Result Value Ref Range   Iron 69 28 - 170 ug/dL   TIBC NOT CALCULATED 250 - 450 ug/dL   Saturation Ratios NOT CALCULATED 10.4 - 31.8 %   UIBC NOT CALCULATED ug/dL  Ferritin     Status: None   Collection Time: 03/27/17 10:25 AM  Result Value Ref Range   Ferritin 201 11 - 307 ng/mL  Reticulocytes     Status: Abnormal   Collection Time: 03/27/17 10:25 AM  Result Value Ref Range   Retic Ct Pct 3.5 (H) 0.4 - 3.1 %   RBC. 2.84 (L) 3.80 - 5.20 MIL/uL   Retic Count, Manual 99.4 19.0 - 183.0 K/uL    Current Facility-Administered Medications  Medication Dose Route Frequency Provider  Last Rate Last Dose  . acetaminophen (TYLENOL) tablet 650 mg  650 mg Oral Q6H PRN Epifanio Lesches, MD   650 mg at 03/23/17 4268   Or  . acetaminophen (TYLENOL) suppository 650 mg  650 mg Rectal Q6H PRN Epifanio Lesches, MD      . anastrozole (ARIMIDEX) tablet 1 mg   1 mg Oral Daily Epifanio Lesches, MD   1 mg at 03/27/17 0931  . bisacodyl (DULCOLAX) EC tablet 5 mg  5 mg Oral Daily PRN Epifanio Lesches, MD      . cefTRIAXone (ROCEPHIN) 1 g in dextrose 5 % 50 mL IVPB  1 g Intravenous Q24H Loletha Grayer, MD   Stopped at 03/27/17 1002  . clonazePAM (KLONOPIN) tablet 0.5 mg  0.5 mg Oral QHS PRN Epifanio Lesches, MD      . enoxaparin (LOVENOX) injection 40 mg  40 mg Subcutaneous Q24H Loletha Grayer, MD   40 mg at 03/27/17 1737  . flecainide (TAMBOCOR) tablet 50 mg  50 mg Oral Q12H Epifanio Lesches, MD   50 mg at 03/27/17 0932  . fludrocortisone (FLORINEF) tablet 0.2 mg  0.2 mg Oral BID Loletha Grayer, MD   0.2 mg at 03/27/17 1737  . fluticasone (FLONASE) 50 MCG/ACT nasal spray 1 spray  1 spray Each Nare Daily Epifanio Lesches, MD   1 spray at 03/27/17 0931  . hydrocortisone (ANUSOL-HC) suppository 25 mg  25 mg Rectal BID PRN Piscoya, Jose, MD      . loperamide (IMODIUM) capsule 2 mg  2 mg Oral QID PRN Epifanio Lesches, MD      . loratadine (CLARITIN) tablet 10 mg  10 mg Oral Daily PRN Epifanio Lesches, MD      . midodrine (PROAMATINE) tablet 10 mg  10 mg Oral TID WC Loletha Grayer, MD   10 mg at 03/27/17 1737  . mirtazapine (REMERON) tablet 30 mg  30 mg Oral QHS Clapacs, John T, MD      . morphine (MS CONTIN) 12 hr tablet 15 mg  15 mg Oral Q8H Wieting, Richard, MD   15 mg at 03/27/17 1344  . ondansetron (ZOFRAN) tablet 4 mg  4 mg Oral Q6H PRN Epifanio Lesches, MD       Or  . ondansetron (ZOFRAN) injection 4 mg  4 mg Intravenous Q6H PRN Epifanio Lesches, MD   4 mg at 03/25/17 1859  . oxyCODONE (Oxy IR/ROXICODONE) immediate release tablet 15 mg  15 mg Oral Q6H PRN Epifanio Lesches, MD   15 mg at 03/27/17 1737  . pantoprazole (PROTONIX) injection 40 mg  40 mg Intravenous Q12H Gladstone Lighter, MD   40 mg at 03/27/17 0931  . potassium chloride SA (K-DUR,KLOR-CON) CR tablet 20 mEq  20 mEq Oral BID Loletha Grayer, MD    20 mEq at 03/27/17 0931  . pregabalin (LYRICA) capsule 150 mg  150 mg Oral BID Gladstone Lighter, MD   150 mg at 03/27/17 0931  . sertraline (ZOLOFT) tablet 150 mg  150 mg Oral QHS Epifanio Lesches, MD   150 mg at 03/26/17 2307  . tiZANidine (ZANAFLEX) tablet 2 mg  2 mg Oral TID Gladstone Lighter, MD   2 mg at 03/27/17 1547  . traZODone (DESYREL) tablet 25 mg  25 mg Oral QHS PRN Epifanio Lesches, MD        Musculoskeletal: Strength & Muscle Tone: decreased Gait & Station: unable to stand Patient leans: N/A  Psychiatric Specialty Exam: Physical Exam  Nursing note and vitals reviewed. Constitutional: She appears  well-developed. She appears distressed.  HENT:  Head: Normocephalic and atraumatic.  Eyes: Conjunctivae are normal. Pupils are equal, round, and reactive to light.  Neck: Normal range of motion.  Cardiovascular: Regular rhythm and normal heart sounds.   Respiratory: Effort normal.  GI: Soft.  Musculoskeletal: Normal range of motion.       Arms: Neurological: She is alert.  Skin: Skin is warm and dry.  Psychiatric: Her speech is delayed. She is slowed. Cognition and memory are normal. She expresses inappropriate judgment. She exhibits a depressed mood. She expresses suicidal ideation. She expresses no suicidal plans.    Review of Systems  Constitutional: Positive for malaise/fatigue and weight loss.  HENT: Negative.   Eyes: Negative.   Respiratory: Negative.   Cardiovascular: Negative.   Gastrointestinal: Positive for diarrhea.  Musculoskeletal: Negative.   Skin: Negative.   Neurological: Positive for weakness.  Psychiatric/Behavioral: Positive for depression and suicidal ideas. Negative for hallucinations, memory loss and substance abuse. The patient is nervous/anxious and has insomnia.     Blood pressure 140/83, pulse 63, temperature 98.1 F (36.7 C), temperature source Oral, resp. rate 18, height 5' 6"  (1.676 m), weight 52.7 kg (116 lb 3.2 oz), SpO2 99  %.Body mass index is 18.76 kg/m.  General Appearance: Disheveled  Eye Contact:  Minimal  Speech:  Slow  Volume:  Decreased  Mood:  Depressed  Affect:  Constricted and Depressed  Thought Process:  Coherent  Orientation:  Full (Time, Place, and Person)  Thought Content:  Rumination  Suicidal Thoughts:  Yes.  without intent/plan  Homicidal Thoughts:  No  Memory:  Immediate;   Good Recent;   Fair Remote;   Fair  Judgement:  Fair  Insight:  Shallow  Psychomotor Activity:  Decreased  Concentration:  Concentration: Fair  Recall:  [employer] of Knowledge:  Fair  Language:  Fair  Akathisia:  No  Handed:  Right  AIMS (if indicated):     Assets:  Financial Resources/Insurance Housing  ADL's:  Impaired  Cognition:  Impaired,  Mild  Sleep:        Treatment Plan Summary: Daily contact with patient to assess and evaluate symptoms and progress in treatment, Medication management and Plan 61 year old woman with chronic depression. Chronic medical problems. Suicidal thoughts that have been long-standing but with no intention to act on them. Does not need inpatient psychiatric treatment. I do recommend that we can make some changes to her medicine. Increase the nighttime dose of mirtazapine to 30 mg. Patient informed of this and is agreeable. Encouraged her to try and keep her spirits up and communicate with her treatment providers. I will follow-up as needed.  Disposition: Patient does not meet criteria for psychiatric inpatient admission. Supportive therapy provided about ongoing stressors.  Alethia Berthold, MD 03/27/2017 7:24 PM

## 2017-03-27 NOTE — Progress Notes (Signed)
Rn observed the prolapsed rectum to normal.Will continue to monitor

## 2017-03-27 NOTE — Consult Note (Signed)
Consultation Note Date: 03/27/2017   Patient Name: Lindsay [name]  DOB: 09/29/1956  MRN: [id number]  Age / Sex: 61 y.o., female  PCP: System, Pcp Not In Referring Physician: Gladstone Lighter, MD  Reason for Consultation: Establishing goals of care  HPI/Patient Profile: 61 y.o. female  with past medical history of breast cancer, gastric bypass, systolic CHF, orthostatic hypotension, nonischemic cardiomyopathy, atrial fibrillation, avascular necrosis of right hip, DVT s/p IVC filter, right wrist drop, malnutrition, chronic pain syndrome, fibromyalgia, depression, and peripheral neuropathy admitted on 03/22/2017 with weakness and recurrent falls. Followed by many doctors at [facility]. On 5/20 evening, patient had rectal prolapse. Surgical consult pending. Patient with orthostatic hypotension on midodrine and florinef. Neurology following for right wrist drop. MRI c-spine negative for acute cardiac compression. Pending non-urgent surgery for avascular necrosis at [facility]. Palliative medicine consultation for goals of care.          Clinical Assessment and Goals of Care: I have reviewed medical records, discussed with care team, and met with patient at bedside to discuss diagnosis, GOC, EOL wishes, disposition and options. Patient alert, oriented, and able to participate in the conversation.   Introduced Palliative Medicine as specialized medical care for people living with serious illness. It focuses on providing relief from the symptoms and stress of a serious illness. The goal is to improve quality of life for both the patient and the family.  We discussed a brief life review of the patient. Lives alone with her two dogs who are her "children." She has an 36 yo mother who suffers from Alzheimer's. Also has support from sister who lives in [place]. She uses a walker or wheelchair on a daily basis. She has a good appetite but  speaks of losing weight since her gastric bypass surgery and breast cancer. She worked for the state for many years.   Discussed hospital diagnoses, interventions, and underlying co-morbidities.   The patient tells me she is "frustrated and depressed" with her current health status. She is always in pain but states relief from current medications. Ms. Riga has a pain management MD at [facility].  I attempted to elicit values and goals of care important to the patient. She wants to "get well enough to go home with the dogs." She understands it may be best to go to rehab prior to returning home. She shares memories from when "they almost lost me to breast cancer." At that time, she was on hospice and family was called in from out of town. She feels "blessed" that God has given her 8 additional years of life and states "he is in control."    Advanced directives, concepts specific to code status, and artifical feeding and hydration were discussed. Ms. Labella tells me she WOULD want resuscitation attempted and to be placed on life support but only short term. She would NOT want to live "in a vegetative state." Educated on my medical recommendation for DNR/DNI with multiple co-morbidities. Encouraged she consider EOL wishes as chronic diseases progress or current health status continues  to decline--what is it she would want for herself? Encouraged she consider designating a trusted decision maker (such as her sister) as HCPOA.     Caregiver arrived at the end of our conversation. Patient requested I come back tomorrow. Answered questions and concerns.    SUMMARY OF RECOMMENDATIONS    FULL code. Educated on my recommendation for DNR/DNI with multiple chronic co-morbidities.  Continue current interventions. Goal is to return home with her dogs. Likely will need rehab first.   Long term, patient would NOT want to be kept alive by heroic means if she did not have quality of life.   Encouraged her to consider  completing advanced directives and designating a HCPOA.   May benefit from psych consult to evaluate depression.   May benefit from palliative services to continue to follow on discharge.   PMT will continue to support patient/family through hospitalization.   Code Status/Advance Care Planning:  Full code  Symptom Management:   Continue current medications for pain--patient sees pain specialist at [facility].   Palliative Prophylaxis:   Aspiration, Delirium Protocol, Frequent Pain Assessment and Oral Care  Additional Recommendations (Limitations, Scope, Preferences):  Full Scope Treatment  Psycho-social/Spiritual:   Desire for further Chaplaincy support:yes  Additional Recommendations: Caregiving  Support/Resources  Prognosis:   Unable to determine  Discharge Planning: To Be Determined      Primary Diagnoses: Present on Admission: . Syncope . Anemia   I have reviewed the medical record, interviewed the patient and family, and examined the patient. The following aspects are pertinent.  Past Medical History:  Diagnosis Date  . Anemia of chronic disease   . C. difficile diarrhea 10/2011  . Cancer ([place])   . Chronic cough   . Chronic systolic CHF (congestive heart failure) ([place])    a. echo 05/2015: EF 45-50%, nl wall motion, mod MR, nl PASP  . Depression   . Depression   . Fibromyalgia   . History of DVT (deep vein thrombosis)    a. s/p IVC filter 2014  . HX: breast cancer    a. right-side; b. s/p lumpectomy & chemo w/ Docetaxol, cytoxan, adriamycin  . Kidney stones 2013  . Malnutrition ([place])    a. chronic diarrhea s/p most meals   . Moderate mitral regurgitation   . Muscle spasm   . NICM (nonischemic cardiomyopathy) ([place])    a. Lexiscan 08/2015: no sig ischemia, no ECG changes concerning for ischemia (baseline abnormal T wave along anterolateral leads), EF 61%, low risk scan  . Orthostatic hypotension   . PAF (paroxysmal atrial fibrillation) ([place])    a. not  on long term, full-dose anticoagulation since 2011, had high INR at that time 2/2 possible malnutition, has been off anticoagulation since; b. CHADS2VASc at least 2 (CHF, female); c. 48 hr Holter 09/2015 NSR w/ rare PACs, no significant PVCs, periods of sinus tachycarida with average heart rate typically in the 60-100 range. No sig arrhythmia. No Afib seen.   . 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 Postural hypotension   . S/P gastric bypass   . Vitamin D deficiency    Social History   Social History  . Marital status: Widowed    Spouse name: N/A  . Number of children: N/A  . Years of education: N/A   Social History Main Topics  . Smoking status: Former Smoker    Packs/day: 0.25    Years: 1.00    Types: Cigarettes    Quit date: 11/10/1985  .  Smokeless tobacco: Never Used  . Alcohol use No  . Drug use: No  . Sexual activity: Not Asked   Other Topics Concern  . None   Social History Narrative  . None   Family History  Problem Relation Age of Onset  . CAD Father        MI  . Diabetes Mellitus II Father   . Hypertension Father   . Diabetes Mellitus II Sister    Scheduled Meds: . anastrozole  1 mg Oral Daily  . enoxaparin (LOVENOX) injection  40 mg Subcutaneous Q24H  . flecainide  50 mg Oral Q12H  . fludrocortisone  0.2 mg Oral BID  . fluticasone  1 spray Each Nare Daily  . midodrine  10 mg Oral TID WC  . mirtazapine  15 mg Oral QHS  . morphine  15 mg Oral Q8H  . pantoprazole (PROTONIX) IV  40 mg Intravenous Q12H  . potassium chloride  20 mEq Oral BID  . pregabalin  150 mg Oral BID  . sertraline  150 mg Oral QHS  . tiZANidine  2 mg Oral TID   Continuous Infusions: . cefTRIAXone (ROCEPHIN)  IV Stopped (03/27/17 1002)   PRN Meds:.acetaminophen **OR** acetaminophen, bisacodyl, clonazePAM, loperamide, loratadine, ondansetron **OR** ondansetron (ZOFRAN) IV, oxyCODONE, traZODone Medications Prior to Admission:  Prior to Admission medications   Medication  Sig Start Date End Date Taking? Authorizing Provider  anastrozole (ARIMIDEX) 1 MG tablet Take 1 mg by mouth daily.     Yes [provider]  aspirin EC 81 MG tablet Take 81 mg by mouth daily.   Yes [provider]  Cholecalciferol (VITAMIN D3) 1000 units CAPS Take 1,000 Units by mouth daily.   Yes [provider]  clonazePAM (KLONOPIN) 0.5 MG tablet Take 0.5 mg by mouth at bedtime as needed for anxiety.    Yes [provider]  flecainide (TAMBOCOR) 100 MG tablet Take 0.5 tablets (50 mg total) by mouth every 12 (twelve) hours. 08/27/15  Yes Fritzi Mandes, MD  fludrocortisone (FLORINEF) 0.1 MG tablet Take 0.2 mg by mouth daily.   Yes [provider]  fluticasone (FLONASE) 50 MCG/ACT nasal spray Place 1 spray into the nose daily.    Yes [provider]  lidocaine (LIDODERM) 5 % Place 1-3 patches onto the skin daily. Remove & Discard patch within 12 hours or as directed by MD    Yes [provider]  loperamide (IMODIUM) 2 MG capsule Take 2 mg by mouth 4 (four) times daily as needed for diarrhea or loose stools.   Yes [provider]  loratadine (CLARITIN) 10 MG tablet Take 10 mg by mouth daily as needed for allergies.    Yes [provider]  midodrine (PROAMATINE) 5 MG tablet Take 5-10 mg by mouth 3 (three) times daily with meals. Take 10 mg in the morning, 5 mg mid day and 5 mg at night.   Yes [provider]  mirtazapine (REMERON) 15 MG tablet Take 15 mg by mouth at bedtime.     Yes [provider]  morphine (MS CONTIN) 15 MG 12 hr tablet Take 1 tablet (15 mg total) by mouth every 8 (eight) hours. 05/30/16  Yes Vaughan Basta, MD  omeprazole (PRILOSEC) 40 MG capsule Take 40 mg by mouth daily.   Yes [provider]  oxyCODONE (ROXICODONE) 15 MG immediate release tablet Take 15 mg by mouth every 6 (six) hours as needed for pain.   Yes [provider]  potassium  chloride (K-DUR) 10 MEQ  tablet Take 20 mEq by mouth 2 (two) times daily.   Yes [provider]  pregabalin (LYRICA) 150 MG capsule Take 150 mg by mouth 3 (three) times daily.   Yes [provider]  sertraline (ZOLOFT) 100 MG tablet Take 150 mg by mouth daily.    Yes [provider]  spironolactone (ALDACTONE) 25 MG tablet Take 25 mg by mouth daily.   Yes [provider]  tiZANidine (ZANAFLEX) 4 MG tablet Take 4 mg by mouth 3 (three) times daily.    Yes [provider]  triamcinolone cream (KENALOG) 0.1 % Apply 1 application topically 2 (two) times daily.   Yes [provider]  vitamin B-12 (CYANOCOBALAMIN) 1000 MCG tablet Take 1,000 mcg by mouth daily.    Yes [provider]  vitamin C (ASCORBIC ACID) 500 MG tablet Take 500 mg by mouth daily.   Yes [provider]  cyclobenzaprine (FLEXERIL) 5 MG tablet Take 1 tablet (5 mg total) by mouth 3 (three) times daily as needed for muscle spasms. 03/25/17   Gladstone Lighter, MD  docusate sodium (COLACE) 100 MG capsule Take 1 capsule (100 mg total) by mouth 2 (two) times daily. 03/25/17   Gladstone Lighter, MD  midodrine (PROAMATINE) 10 MG tablet Take 1 tablet (10 mg total) by mouth 3 (three) times daily with meals. 03/25/17   Gladstone Lighter, MD   Allergies  Allergen Reactions  . Shrimp [Shellfish Allergy] Anaphylaxis   Review of Systems  Constitutional: Positive for activity change and fatigue.  Musculoskeletal:       Fall  Neurological: Positive for weakness.   Physical Exam  Constitutional: She is oriented to person, place, and time. She is cooperative. She appears ill.  HENT:  Head: Normocephalic and atraumatic.  Cardiovascular: Regular rhythm.   Pulmonary/Chest: Effort normal and breath sounds normal.  Abdominal: Normal appearance. There is no tenderness.  Neurological: She is alert and oriented to person, place, and time.  Skin: Skin is warm and dry.  Psychiatric: Her speech is normal.  She is withdrawn. Cognition and memory are normal. She exhibits a depressed mood.  Nursing note and vitals reviewed.  Vital Signs: BP (!) 96/58   Pulse 61   Temp 98.2 F (36.8 C) (Oral)   Resp 14   Ht 5' 6"  (1.676 m)   Wt 52.7 kg (116 lb 3.2 oz)   SpO2 100%   BMI 18.76 kg/m  Pain Assessment: 0-10 POSS *See Group Information*: 1-Acceptable,Awake and alert Pain Score: 7   SpO2: SpO2: 100 % O2 Device:SpO2: 100 % O2 Flow Rate: .   IO: Intake/output summary:   Intake/Output Summary (Last 24 hours) at 03/27/17 1114 Last data filed at 03/27/17 1004  Gross per 24 hour  Intake              222 ml  Output                0 ml  Net              222 ml    LBM: Last BM Date: 03/27/17 Baseline Weight: Weight: 40.8 kg (90 lb) Most recent weight: Weight: 52.7 kg (116 lb 3.2 oz)     Palliative Assessment/Data: PPS 50%   Flowsheet Rows     Most Recent Value  Intake Tab  Referral Department  Hospitalist  Unit at Time of Referral  Cardiac/Telemetry Unit  Palliative Care Primary Diagnosis  Pain  Date Notified  03/23/17  Palliative Care Type  New Palliative care  Reason for referral  Clarify Goals of Care, Pain  Date of Admission  03/22/17  Date first seen by Palliative Care  03/27/17  # of days Palliative referral response time  4 Day(s)  # of days IP prior to Palliative referral  1  Clinical Assessment  Palliative Performance Scale Score  50%  Psychosocial & Spiritual Assessment  Palliative Care Outcomes  Patient/Family meeting held?  Yes  Who was at the meeting?  patient  Palliative Care Outcomes  Clarified goals of care, ACP counseling assistance, Provided psychosocial or spiritual support, Improved pain interventions      Time In: 1000 Time Out: 1110 Time Total: 63mn Greater than 50%  of this time was spent counseling and coordinating care related to the above assessment and plan.  Signed by:  MIhor Dow FNP-C Palliative Medicine Team  Phone: [phone]:  [phone]  Please contact Palliative Medicine Team phone at [phone] questions and concerns.  For individual provider: See AShea Evans

## 2017-03-27 NOTE — Progress Notes (Signed)
Physical Therapy Treatment Patient Details Name: Lindsay [name] MRN: [id number] DOB: May 06, 1956 Today's Date: 03/27/2017    History of Present Illness 61 y.o. female with a painful R hip after passing out in wc at home was cleared for mobility by orthopedist, no fracture.  Transfused, now no longer orthostatic.  R wrist drop, splinted.  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 Pt is somewhat anxious/upset about concerns at home that she needs to attend to. Pt demonstrating improved bed mobility and stand transfers. Has no complaints initially of lightheadedness in sit or stand. Requires increased time to gain balance in stand, but eventually able to let go and maintain balance. Tolerates stand balance and slight weight shift without support. Pt able to ambulate along edge of bed for 15 ft with Min guard, but then has complaints of lightheadedness; seated rest with BP of 83/52 after 5 min of sitting. 3 min stand with BP decreasing to 63/39. Pt returned to bed with head lowered closer to supine and lower extremities elevated. Nursing notified. continue PT to progress strength, endurance, stand tolerance and balance to improve functional mobility. Recommendation remain skilled nursing facility at this time.    Follow Up Recommendations  SNF     Equipment Recommendations  None recommended by PT    Recommendations for Other Services       Precautions / Restrictions Precautions Precautions: Fall;Other (comment) (low BP) Restrictions Weight Bearing Restrictions: No    Mobility  Bed Mobility Overal bed mobility: Modified Independent       Supine to sit: Modified independent (Device/Increase time) Sit to supine: Modified independent (Device/Increase time)   General bed  mobility comments: Min use of rail; mild increased time  Transfers Overall transfer level: Needs assistance Equipment used: None Transfers: Sit to/from Stand Sit to Stand: Min guard         General transfer comment: Holds rail for increased time to steady self. Able to let go after several minutes and maintain balance with Min guard  Ambulation/Gait Ambulation/Gait assistance: Min guard Ambulation Distance (Feet): 15 Feet Assistive device: 1 person hand held assist Gait Pattern/deviations: Step-through pattern;Decreased stride length;Narrow base of support     General Gait Details: Mildly unsteady. Ambulated along edge of bed. Becomes mildly light headed with stand tolerance/ambulation with BP reading 63/39   Stairs            Wheelchair Mobility    Modified Rankin (Stroke Patients Only)       Balance Overall balance assessment: History of Falls;Needs assistance Sitting-balance support: Feet supported Sitting balance-Leahy Scale: Good     Standing balance support: Bilateral upper extremity supported;During functional activity Standing balance-Leahy Scale: Fair                              Cognition Arousal/Alertness: Awake/alert Behavior During Therapy: WFL for tasks assessed/performed Overall Cognitive Status: Within Functional Limits for tasks assessed                                        Exercises      General Comments  Pertinent Vitals/Pain Pain Assessment:  (generalized chronic pain; no too bad currently)    Home Living                      Prior Function            PT Goals (current goals can now be found in the care plan section) Progress towards PT goals: Progressing toward goals    Frequency    Min 2X/week      PT Plan Current plan remains appropriate    Co-evaluation              AM-PAC PT "6 Clicks" Daily Activity  Outcome Measure  Difficulty turning over in bed  (including adjusting bedclothes, sheets and blankets)?: None Difficulty moving from lying on back to sitting on the side of the bed? : A Little Difficulty sitting down on and standing up from a chair with arms (e.g., wheelchair, bedside commode, etc,.)?: A Little Help needed moving to and from a bed to chair (including a wheelchair)?: A Little Help needed walking in hospital room?: A Little Help needed climbing 3-5 steps with a railing? : A Lot 6 Click Score: 18    End of Session Equipment Utilized During Treatment: Gait belt Activity Tolerance: Other (comment) (low BP) Patient left: in bed;with call bell/phone within reach;with bed alarm set;with nursing/sitter in room (feet elevated; head lowered ) Nurse Communication: Other (comment) (BP readings) PT Visit Diagnosis: Unsteadiness on feet (R26.81)     Time: 3254-9826 PT Time Calculation (min) (ACUTE ONLY): 28 min  Charges:  $Gait Training: 8-22 mins $Therapeutic Activity: 8-22 mins                    G CodesLarae Grooms, PTA 03/27/2017, 4:05 PM

## 2017-03-27 NOTE — Consult Note (Signed)
Date of Consultation:  03/27/2017  Requesting Physician:  Gladstone Lighter, MD  Reason for Consultation:  Rectal prolapse  History of Present Illness: Lindsay [name] is a 61 y.o. female admitted on 03/22/17 after a series of falls. Patient reports that more recently she has been falling multiple times a day due to dizziness and lightheadedness. She reports that she's been passing out multiple times and she thinks is due to her blood pressure. She does have orthostatic hypotension here in the hospital has been treated since for this. During this hospital stay, multiple other issues have been addressed including a right wrist drop, right hip avascular necrosis, atrial fibrillation which is paroxysmal in nature, peripheral neuropathy, and congestive heart failure.  Patient reports that a month ago she felt that she had tissue protruding through her rectum after having a bowel movement. This was associated with bleeding that she reports was significant. She mentions that nothing else happen and that the tissue went back inside on its own. She did not try to reduce it on her own. And then last night while in the hospital this happen again and tissue protruded and she reports that this is painful in nature. She currently feels like this tissue is protruding still and is causing irritation. She denies having any constipation and reports that her bowel movements are soft. She has had multiple surgeries including a hysterectomy. She does not think that she is currently bleeding. She has never tried to reduce the tissue on her own. Not sought any surgical consultations for this in the past.  Past Medical History: Past Medical History:  Diagnosis Date  . Anemia of chronic disease   . C. difficile diarrhea 10/2011  . Cancer ([place])   . Chronic cough   . Chronic systolic CHF (congestive heart failure) ([place])    a. echo 05/2015: EF 45-50%, nl wall motion, mod MR, nl PASP  . Depression   . Depression   .  Fibromyalgia   . History of DVT (deep vein thrombosis)    a. s/p IVC filter 2014  . HX: breast cancer    a. right-side; b. s/p lumpectomy & chemo w/ Docetaxol, cytoxan, adriamycin  . Kidney stones 2013  . Malnutrition ([place])    a. chronic diarrhea s/p most meals   . Moderate mitral regurgitation   . Muscle spasm   . NICM (nonischemic cardiomyopathy) ([place])    a. Lexiscan 08/2015: no sig ischemia, no ECG changes concerning for ischemia (baseline abnormal T wave along anterolateral leads), EF 61%, low risk scan  . Orthostatic hypotension   . PAF (paroxysmal atrial fibrillation) ([place])    a. not on long term, full-dose anticoagulation since 2011, had high INR at that time 2/2 possible malnutition, has been off anticoagulation since; b. CHADS2VASc at least 2 (CHF, female); c. 48 hr Holter 09/2015 NSR w/ rare PACs, no significant PVCs, periods of sinus tachycarida with average heart rate typically in the 60-100 range. No sig arrhythmia. No Afib seen.   . 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 Postural hypotension   . S/P gastric bypass   . Vitamin D deficiency      Past Surgical History: Past Surgical History:  Procedure Laterality Date  . ABDOMINAL HYSTERECTOMY    . BREAST LUMPECTOMY     right @ [facility]  . ESOPHAGOGASTRODUODENOSCOPY (EGD) WITH PROPOFOL N/A 05/25/2015   Procedure: ESOPHAGOGASTRODUODENOSCOPY (EGD) WITH PROPOFOL;  Surgeon: Manya Silvas, MD;  Location: [facility] ENDOSCOPY;  Service: Endoscopy;  Laterality: N/A;  . LITHOTRIPSY  jan 2013    Home Medications: Prior to Admission medications   Medication Sig Start Date End Date Taking? Authorizing Provider  anastrozole (ARIMIDEX) 1 MG tablet Take 1 mg by mouth daily.     Yes [provider]  aspirin EC 81 MG tablet Take 81 mg by mouth daily.   Yes [provider]  Cholecalciferol (VITAMIN D3) 1000 units CAPS Take 1,000 Units by mouth daily.   Yes [provider]  clonazePAM (KLONOPIN) 0.5 MG  tablet Take 0.5 mg by mouth at bedtime as needed for anxiety.    Yes [provider]  flecainide (TAMBOCOR) 100 MG tablet Take 0.5 tablets (50 mg total) by mouth every 12 (twelve) hours. 08/27/15  Yes Fritzi Mandes, MD  fludrocortisone (FLORINEF) 0.1 MG tablet Take 0.2 mg by mouth daily.   Yes [provider]  fluticasone (FLONASE) 50 MCG/ACT nasal spray Place 1 spray into the nose daily.    Yes [provider]  lidocaine (LIDODERM) 5 % Place 1-3 patches onto the skin daily. Remove & Discard patch within 12 hours or as directed by MD    Yes [provider]  loperamide (IMODIUM) 2 MG capsule Take 2 mg by mouth 4 (four) times daily as needed for diarrhea or loose stools.   Yes [provider]  loratadine (CLARITIN) 10 MG tablet Take 10 mg by mouth daily as needed for allergies.    Yes [provider]  midodrine (PROAMATINE) 5 MG tablet Take 5-10 mg by mouth 3 (three) times daily with meals. Take 10 mg in the morning, 5 mg mid day and 5 mg at night.   Yes [provider]  mirtazapine (REMERON) 15 MG tablet Take 15 mg by mouth at bedtime.     Yes [provider]  morphine (MS CONTIN) 15 MG 12 hr tablet Take 1 tablet (15 mg total) by mouth every 8 (eight) hours. 05/30/16  Yes Vaughan Basta, MD  omeprazole (PRILOSEC) 40 MG capsule Take 40 mg by mouth daily.   Yes [provider]  oxyCODONE (ROXICODONE) 15 MG immediate release tablet Take 15 mg by mouth every 6 (six) hours as needed for pain.   Yes [provider]  potassium chloride (K-DUR) 10 MEQ tablet Take 20 mEq by mouth 2 (two) times daily.   Yes [provider]  pregabalin (LYRICA) 150 MG capsule Take 150 mg by mouth 3 (three) times daily.   Yes [provider]  sertraline (ZOLOFT) 100 MG tablet Take 150 mg by mouth daily.    Yes [provider]  spironolactone (ALDACTONE) 25 MG tablet Take 25 mg by mouth daily.   Yes [provider]  tiZANidine (ZANAFLEX) 4 MG tablet Take 4 mg by mouth 3 (three) times daily.    Yes [provider]  triamcinolone cream (KENALOG) 0.1 % Apply 1 application topically 2 (two) times daily.   Yes [provider]  vitamin B-12 (CYANOCOBALAMIN) 1000 MCG tablet Take 1,000 mcg by mouth daily.    Yes [provider]  vitamin C (ASCORBIC ACID) 500 MG tablet Take 500 mg by mouth daily.   Yes [provider]  cyclobenzaprine (FLEXERIL) 5 MG tablet Take 1 tablet (5 mg total) by mouth 3 (three) times daily as needed for muscle spasms. 03/25/17   Gladstone Lighter, MD  docusate sodium (COLACE) 100 MG capsule Take 1 capsule (100 mg total) by mouth 2 (two) times daily. 03/25/17  Gladstone Lighter, MD  midodrine (PROAMATINE) 10 MG tablet Take 1 tablet (10 mg total) by mouth 3 (three) times daily with meals. 03/25/17   Gladstone Lighter, MD    Allergies: Allergies  Allergen Reactions  . Shrimp [Shellfish Allergy] Anaphylaxis    Social History:  reports that she quit smoking about 31 years ago. Her smoking use included Cigarettes. She has a 0.25 pack-year smoking history. She has never used smokeless tobacco. She reports that she does not drink alcohol or use drugs.   Family History: Family History  Problem Relation Age of Onset  . CAD Father        MI  . Diabetes Mellitus II Father   . Hypertension Father   . Diabetes Mellitus II Sister     Review of Systems: Review of Systems  Constitutional: Negative for chills and fever.  Cardiovascular: Negative for chest pain.  Gastrointestinal: Positive for blood in stool. Negative for abdominal pain, constipation, nausea and vomiting.  Neurological: Positive for dizziness, loss of consciousness and weakness.  All other systems reviewed and are negative.   Physical Exam BP 140/83 (BP Location: Left Arm)   Pulse 63   Temp 98.1 F (36.7 C) (Oral)   Resp 18   Ht 5\' 6"  (1.676 m)   Wt 52.7 kg (116 lb  3.2 oz)   SpO2 99%   BMI 18.76 kg/m  CONSTITUTIONAL: No acute distress HEENT:  Normocephalic, atraumatic, extraocular motion intact. RESPIRATORY:  Lungs are clear, and breath sounds are equal bilaterally. Normal respiratory effort without pathologic use of accessory muscles. CARDIOVASCULAR: Heart is regular with 2/6 systolic murmur. GI: The abdomen is soft, nondistended, nontender to palpation.  RECTAL: No protruding tissue on external exam. On digital rectal exam, patient does have enlarged hemorrhoidal tissue but otherwise no lesions or masses. Normal rectal tone. No blood in glove. MUSCULOSKELETAL:  No cyanosis or clubbing. Patient does have bilateral lower extremity edema. SKIN: Skin turgor is normal. There are no pathologic skin lesions.  NEUROLOGIC:  Motor and sensation is grossly normal.  Cranial nerves are grossly intact. PSYCH:  Alert and oriented to person, place and time. Affect is normal.  Laboratory Analysis: Results for orders placed or performed during the hospital encounter of 03/22/17 (from the past 24 hour(s))  Glucose, capillary     Status: None   Collection Time: 03/27/17  7:47 AM  Result Value Ref Range   Glucose-Capillary 82 65 - 99 mg/dL  CBC     Status: Abnormal   Collection Time: 03/27/17  9:05 AM  Result Value Ref Range   WBC 6.1 3.6 - 11.0 K/uL   RBC 2.78 (L) 3.80 - 5.20 MIL/uL   Hemoglobin 9.0 (L) 12.0 - 16.0 g/dL   HCT 26.6 (L) 35.0 - 47.0 %   MCV 95.6 80.0 - 100.0 fL   MCH 32.3 26.0 - 34.0 pg   MCHC 33.8 32.0 - 36.0 g/dL   RDW 16.5 (H) 11.5 - 14.5 %   Platelets 209 150 - 440 K/uL  Hemoglobin     Status: Abnormal   Collection Time: 03/27/17 10:25 AM  Result Value Ref Range   Hemoglobin 8.9 (L) 12.0 - 16.0 g/dL  Folate     Status: None   Collection Time: 03/27/17 10:25 AM  Result Value Ref Range   Folate 12.5 >5.9 ng/mL  Iron and TIBC     Status: None   Collection Time: 03/27/17 10:25 AM  Result Value Ref Range   Iron 69 28 - 170  ug/dL   TIBC  NOT CALCULATED 250 - 450 ug/dL   Saturation Ratios NOT CALCULATED 10.4 - 31.8 %   UIBC NOT CALCULATED ug/dL  Ferritin     Status: None   Collection Time: 03/27/17 10:25 AM  Result Value Ref Range   Ferritin 201 11 - 307 ng/mL  Reticulocytes     Status: Abnormal   Collection Time: 03/27/17 10:25 AM  Result Value Ref Range   Retic Ct Pct 3.5 (H) 0.4 - 3.1 %   RBC. 2.84 (L) 3.80 - 5.20 MIL/uL   Retic Count, Manual 99.4 19.0 - 183.0 K/uL    Imaging: No results found.  Assessment and Plan: This is a 61 y.o. female mated after multiple falls with orthostatic hypotension, now with complaint of rectal prolapse.  On my exam there was no tissue protruding and my suspicion would be that this is more likely hemorrhoidal tissue that is prolapsing. Unfortunately unable to perform any more thorough examination at bedside including anoscopy. However this point there is no urgent surgical needs and no need for any surgical intervention at this point. Suggested that she could use Anusol suppositories for hemorrhoidal discomfort. This has been ordered. Currently there is no constipation but if needed she could take MiraLAX daily to keep her stool soft. She could also perform sitz baths at home as well though this may be hard given her orthopedic issues.  Given that the majority of her medical care is at [facility] and she has multiple doctors there, she could be referred to colorectal surgery there for further evaluation of her hemorrhoids versus the possibility of rectal prolapse and for any potential surgical management that is required. Otherwise no other needs while in hospital from our standpoint.  As for free to call or consult again if any questions or concerns.   Melvyn Neth, [place] 7a-7p: [place] 7p-7a: [phone]

## 2017-03-27 NOTE — Progress Notes (Signed)
[place] at [place] NAME: Lindsay [name]    MR#:  [id number]  DATE OF BIRTH:  [birth year]  SUBJECTIVE:  CHIEF COMPLAINT:   Chief Complaint  Patient presents with  . Fall   -upset about rectal prolapse that happened last night and complains of significant pain and pain. -Hypotensive again today while sitting and was symptomatic.  REVIEW OF SYSTEMS:  Review of Systems  Constitutional: Negative for chills, fever and malaise/fatigue.  HENT: Negative for congestion, ear discharge, hearing loss and nosebleeds.   Eyes: Negative for blurred vision.  Respiratory: Negative for cough, shortness of breath and wheezing.   Cardiovascular: Positive for leg swelling. Negative for chest pain and palpitations.  Gastrointestinal: Positive for abdominal pain. Negative for constipation, diarrhea, nausea and vomiting.  Genitourinary: Negative for dysuria.  Musculoskeletal: Positive for joint pain and myalgias.  Neurological: Negative for dizziness, sensory change, speech change, focal weakness, seizures and headaches.    DRUG ALLERGIES:   Allergies  Allergen Reactions  . Shrimp [Shellfish Allergy] Anaphylaxis    VITALS:  Blood pressure (!) 96/58, pulse 61, temperature 98.2 F (36.8 C), temperature source Oral, resp. rate 14, height 5\' 6"  (1.676 m), weight 52.7 kg (116 lb 3.2 oz), SpO2 100 %.  PHYSICAL EXAMINATION:  Physical Exam  GENERAL:  61 y.o.-year-old Chronically ill appearing patient lying in the bed with no acute distress.  EYES: Pupils equal, round, reactive to light and accommodation. No scleral icterus. Extraocular muscles intact.  HEENT: Head atraumatic, normocephalic. Oropharynx and nasopharynx clear.  NECK:  Supple, no jugular venous distention. No thyroid enlargement, no tenderness.  LUNGS: Normal breath sounds bilaterally, no wheezing, rales,rhonchi or crepitation. No use of accessory muscles of respiration.  CARDIOVASCULAR: S1, S2  normal. No rubs, or gallops. 2/6 systolic murmur is present ABDOMEN: Soft, nontender, nondistended. Bowel sounds present. No organomegaly or mass.  No prolapse on my exam- reduced last night. Dry skin noted. EXTREMITIES: No  cyanosis, or clubbing. 1+ pedal edema noted NEUROLOGIC: Cranial nerves II through XII are intact. Muscle strength 5/5 in both upper extremities, 3/5 in lower extremities. Sensation intact. Gait not checked.  PSYCHIATRIC: The patient is alert and oriented x 2-3,  poor judgment.  SKIN: No obvious rash, lesion, or ulcer.    LABORATORY PANEL:   CBC  Recent Labs Lab 03/27/17 0905  WBC 6.1  HGB 9.0*  HCT 26.6*  PLT 209   ------------------------------------------------------------------------------------------------------------------  Chemistries   Recent Labs Lab 03/22/17 1421  03/26/17 0536  NA 145  < > 144  K 3.5  < > 4.0  CL 115*  < > 122*  CO2 23  < > 20*  GLUCOSE 78  < > 95  BUN 31*  < > 19  CREATININE 1.25*  < > 1.01*  CALCIUM 8.1*  < > 8.3*  AST 27  --   --   ALT 16  --   --   ALKPHOS 114  --   --   BILITOT 0.8  --   --   < > = values in this interval not displayed. ------------------------------------------------------------------------------------------------------------------  Cardiac Enzymes  Recent Labs Lab 03/22/17 1421  TROPONINI <0.03   ------------------------------------------------------------------------------------------------------------------  RADIOLOGY:  No results found.  EKG:   Orders placed or performed during the hospital encounter of 03/22/17  . ED EKG  . ED EKG    ASSESSMENT AND PLAN:   61 year old female with multiple medical problems including chronic systolic CHF with last EF of  45-50%, anemia of chronic disease, recent history of C. difficile colitis, history of DVT status post IVC, fibromyalgia, right hip avascular necrosis, right wrist drop, non-ischemic cardiomyopathy, malnutrition  presents to the  hospital secondary to fall and syncope.  #1 falls and weakness-secondary to significant orthostatic hypotension. -on midodrine, also on Florinef.  -Continue checking orthostatics. Improved yesterday and low again today- decrease lyrica dose and give a fluid bolus -check hemoglobin today as patient had anemia on presentation and required transfusion this admission -Physical therapy and OT consult recommended rehabilitation at discharge.  #2 right wrist drop-MRI of C-spine did not show any acute cardiac compression. Appreciate neurology consult. -Currently in a splint. Outpatient NCS/EMG study is recommended.  #3 right hip avascular necrosis-appreciate orthopedic consult. Weightbearing as tolerated recommended. -Non-urgent surgery as outpatient.  #4 history of paroxysmal atrial fibrillation-rate is controlled. Continue flecainide. Not a candidate for anticoagulation.  Aspirin held due to potential GI bleed.  #5 peripheral neuropathy has been on Lyrica- decrease dose due to hypotension. Has muscle spasms. Zanaflex is restarted at a lower dose.  #6 congestive heart failure-chronic systolic dysfunction. Not in any acute exacerbation. If needed IV fluids will be given.  monitor carefully.  #7 history of breast cancer-continue anastrozole  #8.Rectal prolpase- reducible, visible on straining this morning. Not aware of any bleeding or ulcerations. Surgical consult due to patient's request.  #9 DVT prophylaxis-on lovenox  palliative care has been consulted to address goals of treatment   All the records are reviewed and case discussed with Care Management/Social Workerr. Management plans discussed with the patient, family and they are in agreement.  CODE STATUS: Full code  TOTAL CRITICAL CARE TIME TAKING CARE OF THIS PATIENT: 41 minutes.   POSSIBLE D/C in 2 days, DEPENDING ON CLINICAL CONDITION.   Gladstone Lighter M.D on 03/27/2017 at 10:05 AM  Between 7am to 6pm - Pager -  [phone]  After 6pm go to [email or web address] - password [email or web address] [facility]  Office  [phone]  CC: Primary care physician; System, Pcp Not In

## 2017-03-27 NOTE — Care Management (Signed)
Informed that patient had agreed to go to short term rehab and when met with social worker, again declined and stated she want to go home.  Referral for home health nurse, physical therapy, occupational therapy and social work called to [facility] for consideration. patient informed CM today she "really does not have an agency preference."

## 2017-03-28 LAB — CBC
HCT: 26.8 % — ABNORMAL LOW (ref 35.0–47.0)
Hemoglobin: 8.8 g/dL — ABNORMAL LOW (ref 12.0–16.0)
MCH: 31.5 pg (ref 26.0–34.0)
MCHC: 33 g/dL (ref 32.0–36.0)
MCV: 95.6 fL (ref 80.0–100.0)
PLATELETS: 224 10*3/uL (ref 150–440)
RBC: 2.8 MIL/uL — ABNORMAL LOW (ref 3.80–5.20)
RDW: 16.7 % — AB (ref 11.5–14.5)
WBC: 7.9 10*3/uL (ref 3.6–11.0)

## 2017-03-28 LAB — GLUCOSE, CAPILLARY
GLUCOSE-CAPILLARY: 98 mg/dL (ref 65–99)
GLUCOSE-CAPILLARY: 163 mg/dL — AB (ref 65–99)

## 2017-03-28 LAB — TROPONIN I: Troponin I: <0.03 ng/mL (ref <0.03)

## 2017-03-28 LAB — COPPER, SERUM: COPPER: 53 ug/dL — AB (ref 72–166)

## 2017-03-28 MED ORDER — EPOETIN ALFA 10000 UNIT/ML IJ SOLN
4000.0000 [IU] | INTRAMUSCULAR | Status: DC
  Administered 2017-03-28: 4000 [IU] via SUBCUTANEOUS
  Filled 2017-03-28: qty 1

## 2017-03-28 MED ORDER — ADULT MULTIVITAMIN W/MINERALS CH
1.0000 | ORAL_TABLET | Freq: Every day | ORAL | Status: DC
  Administered 2017-03-28 – 2017-04-02 (×5): 1 via ORAL
  Filled 2017-03-28 (×6): qty 1

## 2017-03-28 MED ORDER — EPOETIN ALFA 10000 UNIT/ML IJ SOLN: 4000.0000 [IU] | INTRAMUSCULAR | Status: DC

## 2017-03-28 MED ORDER — PANTOPRAZOLE SODIUM 40 MG PO TBEC
40.0000 mg | DELAYED_RELEASE_TABLET | Freq: Two times a day (BID) | ORAL | Status: DC
  Administered 2017-03-28 – 2017-04-02 (×8): 40 mg via ORAL
  Filled 2017-03-28 (×10): qty 1

## 2017-03-28 MED ORDER — BOOST / RESOURCE BREEZE PO LIQD
1.0000 | Freq: Three times a day (TID) | ORAL | Status: DC
  Administered 2017-03-28 – 2017-03-30 (×2): 1 via ORAL

## 2017-03-28 NOTE — Progress Notes (Signed)
[place] at [place] NAME: Lindsay [name]    MR#:  [id number]  DATE OF BIRTH:  1956/10/29  SUBJECTIVE:  CHIEF COMPLAINT:   Chief Complaint  Patient presents with  . Fall   -Upset again today due to non availability of notary services for personal deeds -Still orthostatically hypotensive  REVIEW OF SYSTEMS:  Review of Systems  Constitutional: Negative for chills, fever and malaise/fatigue.  HENT: Negative for congestion, ear discharge, hearing loss and nosebleeds.   Eyes: Negative for blurred vision.  Respiratory: Negative for cough, shortness of breath and wheezing.   Cardiovascular: Positive for leg swelling. Negative for chest pain and palpitations.  Gastrointestinal: Positive for abdominal pain. Negative for constipation, diarrhea, nausea and vomiting.  Genitourinary: Negative for dysuria.  Musculoskeletal: Positive for joint pain and myalgias.  Neurological: Negative for dizziness, sensory change, speech change, focal weakness, seizures and headaches.    DRUG ALLERGIES:   Allergies  Allergen Reactions  . Shrimp [Shellfish Allergy] Anaphylaxis    VITALS:  Blood pressure 116/79, pulse (!) 59, temperature 98 F (36.7 C), temperature source Oral, resp. rate 18, height 5\' 6"  (1.676 m), weight 52.7 kg (116 lb 3.2 oz), SpO2 96 %.  PHYSICAL EXAMINATION:  Physical Exam  GENERAL:  61 y.o.-year-old Chronically ill appearing patient lying in the bed with no acute distress.  EYES: Pupils equal, round, reactive to light and accommodation. No scleral icterus. Extraocular muscles intact.  HEENT: Head atraumatic, normocephalic. Oropharynx and nasopharynx clear.  NECK:  Supple, no jugular venous distention. No thyroid enlargement, no tenderness.  LUNGS: Normal breath sounds bilaterally, no wheezing, rales,rhonchi or crepitation. No use of accessory muscles of respiration.  CARDIOVASCULAR: S1, S2 normal. No rubs, or gallops. 2/6 systolic  murmur is present ABDOMEN: Soft, nontender, nondistended. Bowel sounds present. No organomegaly or mass.  No prolapse on my exam- reduced last night. Dry skin noted. EXTREMITIES: No  cyanosis, or clubbing. 1+ pedal edema noted NEUROLOGIC: Cranial nerves II through XII are intact. Muscle strength 5/5 in both upper extremities, 3/5 in lower extremities. Sensation intact. Gait not checked.  PSYCHIATRIC: The patient is alert and oriented x 2-3,  poor judgment.  SKIN: No obvious rash, lesion, or ulcer.    LABORATORY PANEL:   CBC  Recent Labs Lab 03/28/17 0430  WBC 7.9  HGB 8.8*  HCT 26.8*  PLT 224   ------------------------------------------------------------------------------------------------------------------  Chemistries   Recent Labs Lab 03/22/17 1421  03/26/17 0536  NA 145  < > 144  K 3.5  < > 4.0  CL 115*  < > 122*  CO2 23  < > 20*  GLUCOSE 78  < > 95  BUN 31*  < > 19  CREATININE 1.25*  < > 1.01*  CALCIUM 8.1*  < > 8.3*  AST 27  --   --   ALT 16  --   --   ALKPHOS 114  --   --   BILITOT 0.8  --   --   < > = values in this interval not displayed. ------------------------------------------------------------------------------------------------------------------  Cardiac Enzymes  Recent Labs Lab 03/22/17 1421  TROPONINI <0.03   ------------------------------------------------------------------------------------------------------------------  RADIOLOGY:  No results found.  EKG:   Orders placed or performed during the hospital encounter of 03/22/17  . ED EKG  . ED EKG    ASSESSMENT AND PLAN:   61 year old female with multiple medical problems including chronic systolic CHF with last EF of 45-50%, anemia of chronic disease, recent history  of C. difficile colitis, history of DVT status post IVC, fibromyalgia, right hip avascular necrosis, right wrist drop, non-ischemic cardiomyopathy, malnutrition  presents to the hospital secondary to fall and  syncope.  #1 falls and weakness-secondary to significant orthostatic hypotension. -on midodrine, also on Florinef.  -Continue checking orthostatics. No improvement with fluid bolus either- will start epogen or pyridostigmine as they have helped in some studies -Physical therapy and OT consult recommended rehabilitation at discharge.  #2 right wrist drop-MRI of C-spine did not show any acute cardiac compression. Appreciate neurology consult. -Currently in a splint. Outpatient NCS/EMG study is recommended.  #3 right hip avascular necrosis-appreciate orthopedic consult. Weightbearing as tolerated recommended. -Non-urgent surgery as outpatient.  #4 history of paroxysmal atrial fibrillation-rate is controlled. Continue flecainide. Not a candidate for anticoagulation.  Aspirin held due to potential GI bleed.  #5 peripheral neuropathy has been on Lyrica- decrease dose due to hypotension. Has muscle spasms. Zanaflex is restarted at a lower dose.  #6 congestive heart failure-chronic systolic dysfunction. Not in any acute exacerbation. If needed IV fluids will be given.  monitor carefully.  #7 history of breast cancer-continue anastrozole  #8.Rectal prolapse vs internal hemorrhoids, none on exam today - appreciate surgical consult, no acute indication for surgery, f/u at [facility] - anusol suppositories for now  #9 DVT prophylaxis-on lovenox  #10 Depression- appreciate psych consult, increased remeron dose.  palliative care has been consulted to address goals of treatment   All the records are reviewed and case discussed with Care Management/Social Workerr. Management plans discussed with the patient, family and they are in agreement.  CODE STATUS: Full code  TOTAL  TIME TAKING CARE OF THIS PATIENT: 42 minutes.   POSSIBLE D/C in 2 days, DEPENDING ON CLINICAL CONDITION.   Gladstone Lighter M.D on 03/28/2017 at 1:33 PM  Between 7am to 6pm - Pager - [phone]  After 6pm go to  [email or web address] - password [email or web address] [facility]  Office  [phone]  CC: Primary care physician; System, Pcp Not In

## 2017-03-28 NOTE — Clinical Social Work Note (Signed)
Patient transferred to 210 from 56, this social worker signing off, handoff given to unit Education officer, museum.  Jones Broom. [place], MSW, [place]  03/28/2017 8:30 AM

## 2017-03-28 NOTE — Care Management Important Message (Signed)
Important Message  Patient Details  Name: Lindsay [name] MRN: [id number] Date of Birth: [birth year]   Medicare Important Message Given:  Yes    Beverly Sessions, RN 03/28/2017, 3:44 PM

## 2017-03-28 NOTE — Consult Note (Signed)
[facility] Consult   Reason for Consult:  Consult for 61 year old woman with multiple chronic medical problems regarding her depression Referring Physician:  Tressia Miners Patient Identification: Lindsay [name] MRN:  [id number] Principal Diagnosis: Severe recurrent major depression without psychotic features [facility]) Diagnosis:   Patient Active Problem List   Diagnosis Date Noted  . Severe recurrent major depression without psychotic features ([place]) [F33.2] 03/27/2017  . Palliative care by specialist [Z51.5]   . Goals of care, counseling/discussion [Z71.89]   . Hemorrhoid prolapse [K64.8]   . Anemia [D64.9] 03/23/2017  . Right arm weakness [R29.898] 01/05/2017  . Chest pain [R07.9] 05/29/2016  . Elevated troponin [R74.8] 05/29/2016  . Atrial flutter ([place]) [I48.92] 05/29/2016  . Postural hypotension [I95.1]   . Moderate mitral regurgitation [I34.0]   . PAF (paroxysmal atrial fibrillation) ([place]) [I48.0]   . Malnutrition ([place]) [E46]   . S/P gastric bypass [Z98.84]   . Fibromyalgia [M79.7]   . Peripheral neuropathy [G62.9]   . NICM (nonischemic cardiomyopathy) ([place]) [I42.8]   . Chronic systolic CHF (congestive heart failure) ([place]) [I50.22]   . Recurrent major depressive disorder, in partial remission ([place]) [F33.41]   . Major depression [F32.9] 08/26/2015  . Weakness of both legs [R29.898]   . Absolute anemia [D64.9]   . Pain in the chest [R07.9]   . Orthostatic hypotension [I95.1]   . Paroxysmal atrial fibrillation (HCC) [I48.0]   . Syncope and collapse [R55]   . Paroxysmal A-fib ([place]) [I48.0]   . Atrial fibrillation ([place]) [I48.91] 05/24/2015  . Abdominal pain [R10.9] 05/23/2015  . C. difficile colitis [A04.72] 12/12/2011  . Chest pain [786.5] 11/11/2011  . Syncope [R55] 11/11/2011  . Hypotension [458] 11/11/2011  . Cardiomyopathy [425] 11/11/2011  . AF (atrial fibrillation) ([place]) [I48.91] 11/11/2011    Total Time spent with patient: 15 minutes  Subjective:     Lindsay [name] is a 61 y.o. female patient admitted with "I feel pretty bad".  Follow-up for 61 year old woman with chronic depression. Patient is still complaining of feeling dizzy and feels that her blood pressure is not under good control. Mood is about the same as it was. Seems more reactive and alert today. Not reporting any psychotic symptoms or suicidality.  HPI:  Patient interviewed chart reviewed. Patient known to me from a couple of previous encounters. 61 year old woman with multiple chronic medical problems currently in the hospital for multiple issues but precipitated by a fall at home. Treatment team notes that the patient remains very depressed looking. On interview today the patient says that she is sad down and depressed most of the time. Can't think of anything positive in her life that she enjoys except possibly being with her dogs. Sleep is chronically poor. Energy level is chronically poor. Admits that she rarely gets out of bed at home and less her home health aide assists her. Patient claims that she has been eating okay but it looks like she's gained some weight since coming into the hospital. Possibly could've been not eating well and not drinking well before admission. Claims that she is compliant with her medicine. Patient has chronic suicidal thoughts without any intention to do anything to hurt her self. Denies hallucinations but says that recently she's been noticing that she talks to herself more. She is on 15 mg of mirtazapine at night and 150 mg of sertraline in the day which has been her medicine for over a year.  Social history: Lives by herself in an apartment. Has home health pretty much daily.  Patient complains about her mother's chronic illness. She makes it sound like this is an acute change although looking back this is the same concern she had 2 years ago.  Medical history: Multiple medical problems with probably not very good self-care much of the time. History of  recovery from cancer in the past. Has had several hospitalizations for falls.  Substance abuse history: Denies alcohol or drug use  Past Psychiatric History: Long-standing noted depression that has been treated with medicine as an outpatient with not very much clear improvement. I noticed that on talking to her today this is probably about the same presentation she had a couple years ago. Nevertheless she has never seen a psychiatrist never been in a psychiatric hospital and has no history of suicide attempts or psychosis. Antidepressants in the past including Cymbalta.  Risk to Self: Is patient at risk for suicide?: No (Denies at this time. ) Risk to Others:   Prior Inpatient Therapy:   Prior Outpatient Therapy:    Past Medical History:  Past Medical History:  Diagnosis Date  . Anemia of chronic disease   . C. difficile diarrhea 10/2011  . Cancer ([place])   . Chronic cough   . Chronic systolic CHF (congestive heart failure) ([place])    a. echo 05/2015: EF 45-50%, nl wall motion, mod MR, nl PASP  . Depression   . Depression   . Fibromyalgia   . History of DVT (deep vein thrombosis)    a. s/p IVC filter 2014  . HX: breast cancer    a. right-side; b. s/p lumpectomy & chemo w/ Docetaxol, cytoxan, adriamycin  . Kidney stones 2013  . Malnutrition ([place])    a. chronic diarrhea s/p most meals   . Moderate mitral regurgitation   . Muscle spasm   . NICM (nonischemic cardiomyopathy) ([place])    a. Lexiscan 08/2015: no sig ischemia, no ECG changes concerning for ischemia (baseline abnormal T wave along anterolateral leads), EF 61%, low risk scan  . Orthostatic hypotension   . PAF (paroxysmal atrial fibrillation) ([place])    a. not on long term, full-dose anticoagulation since 2011, had high INR at that time 2/2 possible malnutition, has been off anticoagulation since; b. CHADS2VASc at least 2 (CHF, female); c. 48 hr Holter 09/2015 NSR w/ rare PACs, no significant PVCs, periods of sinus tachycarida with  average heart rate typically in the 60-100 range. No sig arrhythmia. No Afib seen.   . 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 Postural hypotension   . S/P gastric bypass   . Vitamin D deficiency     Past Surgical History:  Procedure Laterality Date  . ABDOMINAL HYSTERECTOMY    . BREAST LUMPECTOMY     right @ [facility]  . ESOPHAGOGASTRODUODENOSCOPY (EGD) WITH PROPOFOL N/A 05/25/2015   Procedure: ESOPHAGOGASTRODUODENOSCOPY (EGD) WITH PROPOFOL;  Surgeon: Manya Silvas, MD;  [facility] END[facility]COPY;  Service: Endoscopy;  Laterality: N/A;  . LITHOTRIPSY  jan 2013   Family History:  Family History  Problem Relation Age of Onset  . CAD Father        MI  . Diabetes Mellitus II Father   . Hypertension Father   . Diabetes Mellitus II Sister    Family Psychiatric  History: None known Social History:  History  Alcohol Use No     History  Drug Use No    Social History   Social History  . Marital status: Widowed    Spouse name: N/A  .  Number of children: N/A  . Years of education: N/A   Social History Main Topics  . Smoking status: Former Smoker    Packs/day: 0.25    Years: 1.00    Types: Cigarettes    Quit date: 11/10/1985  . Smokeless tobacco: Never Used  . Alcohol use No  . Drug use: No  . Sexual activity: Not Asked   Other Topics Concern  . None   Social History Narrative  . None   Additional Social History:    Allergies:   Allergies  Allergen Reactions  . Shrimp [Shellfish Allergy] Anaphylaxis    Labs:  Results for orders placed or performed during the hospital encounter of 03/22/17 (from the past 48 hour(s))  Glucose, capillary     Status: None   Collection Time: 03/27/17  7:47 AM  Result Value Ref Range   Glucose-Capillary 82 65 - 99 mg/dL  CBC     Status: Abnormal   Collection Time: 03/27/17  9:05 AM  Result Value Ref Range   WBC 6.1 3.6 - 11.0 K/uL   RBC 2.78 (L) 3.80 - 5.20 MIL/uL   Hemoglobin 9.0 (L) 12.0 - 16.0 g/dL   HCT 26.6 (L)  35.0 - 47.0 %   MCV 95.6 80.0 - 100.0 fL   MCH 32.3 26.0 - 34.0 pg   MCHC 33.8 32.0 - 36.0 g/dL   RDW 16.5 (H) 11.5 - 14.5 %   Platelets 209 150 - 440 K/uL  Hemoglobin     Status: Abnormal   Collection Time: 03/27/17 10:25 AM  Result Value Ref Range   Hemoglobin 8.9 (L) 12.0 - 16.0 g/dL  Folate     Status: None   Collection Time: 03/27/17 10:25 AM  Result Value Ref Range   Folate 12.5 >5.9 ng/mL  Iron and TIBC     Status: None   Collection Time: 03/27/17 10:25 AM  Result Value Ref Range   Iron 69 28 - 170 ug/dL   TIBC NOT CALCULATED 250 - 450 ug/dL   Saturation Ratios NOT CALCULATED 10.4 - 31.8 %   UIBC NOT CALCULATED ug/dL  Ferritin     Status: None   Collection Time: 03/27/17 10:25 AM  Result Value Ref Range   Ferritin 201 11 - 307 ng/mL  Reticulocytes     Status: Abnormal   Collection Time: 03/27/17 10:25 AM  Result Value Ref Range   Retic Ct Pct 3.5 (H) 0.4 - 3.1 %   RBC. 2.84 (L) 3.80 - 5.20 MIL/uL   Retic Count, Manual 99.4 19.0 - 183.0 K/uL  CBC     Status: Abnormal   Collection Time: 03/28/17  4:30 AM  Result Value Ref Range   WBC 7.9 3.6 - 11.0 K/uL   RBC 2.80 (L) 3.80 - 5.20 MIL/uL   Hemoglobin 8.8 (L) 12.0 - 16.0 g/dL   HCT 26.8 (L) 35.0 - 47.0 %   MCV 95.6 80.0 - 100.0 fL   MCH 31.5 26.0 - 34.0 pg   MCHC 33.0 32.0 - 36.0 g/dL   RDW 16.7 (H) 11.5 - 14.5 %   Platelets 224 150 - 440 K/uL  Glucose, capillary     Status: None   Collection Time: 03/28/17  7:50 AM  Result Value Ref Range   Glucose-Capillary 98 65 - 99 mg/dL    Current Facility-Administered Medications  Medication Dose Route Frequency Provider Last Rate Last Dose  . acetaminophen (TYLENOL) tablet 650 mg  650 mg Oral Q6H PRN Epifanio Lesches, MD  650 mg at 03/23/17 1324   Or  . acetaminophen (TYLENOL) suppository 650 mg  650 mg Rectal Q6H PRN Epifanio Lesches, MD      . anastrozole (ARIMIDEX) tablet 1 mg  1 mg Oral Daily Epifanio Lesches, MD   1 mg at 03/28/17 1118  . bisacodyl  (DULCOLAX) EC tablet 5 mg  5 mg Oral Daily PRN Epifanio Lesches, MD   5 mg at 03/28/17 1127  . clonazePAM (KLONOPIN) tablet 0.5 mg  0.5 mg Oral QHS PRN Epifanio Lesches, MD      . enoxaparin (LOVENOX) injection 40 mg  40 mg Subcutaneous Q24H Loletha Grayer, MD   40 mg at 03/27/17 1737  . epoetin alfa (EPOGEN,PROCRIT) injection 4,000 Units  4,000 Units Subcutaneous Once per day on Tue Thu Sat Gladstone Lighter, MD   4,000 Units at 03/28/17 1424  . feeding supplement (BOOST / RESOURCE BREEZE) liquid 1 Container  1 Container Oral TID BM Gladstone Lighter, MD   1 Container at 03/28/17 1400  . flecainide (TAMBOCOR) tablet 50 mg  50 mg Oral Q12H Epifanio Lesches, MD   50 mg at 03/28/17 4010  . fludrocortisone (FLORINEF) tablet 0.2 mg  0.2 mg Oral BID Loletha Grayer, MD   0.2 mg at 03/28/17 1119  . fluticasone (FLONASE) 50 MCG/ACT nasal spray 1 spray  1 spray Each Nare Daily Epifanio Lesches, MD   1 spray at 03/28/17 1118  . hydrocortisone (ANUSOL-HC) suppository 25 mg  25 mg Rectal BID PRN Piscoya, Jose, MD      . loperamide (IMODIUM) capsule 2 mg  2 mg Oral QID PRN Epifanio Lesches, MD      . loratadine (CLARITIN) tablet 10 mg  10 mg Oral Daily PRN Epifanio Lesches, MD      . midodrine (PROAMATINE) tablet 10 mg  10 mg Oral TID WC Loletha Grayer, MD   10 mg at 03/28/17 1119  . mirtazapine (REMERON) tablet 30 mg  30 mg Oral QHS Clapacs, Madie Reno, MD   30 mg at 03/27/17 2152  . morphine (MS CONTIN) 12 hr tablet 15 mg  15 mg Oral Q8H Wieting, Richard, MD   15 mg at 03/28/17 1423  . multivitamin with minerals tablet 1 tablet  1 tablet Oral Daily Gladstone Lighter, MD   1 tablet at 03/28/17 1423  . ondansetron (ZOFRAN) tablet 4 mg  4 mg Oral Q6H PRN Epifanio Lesches, MD       Or  . ondansetron (ZOFRAN) injection 4 mg  4 mg Intravenous Q6H PRN Epifanio Lesches, MD   4 mg at 03/25/17 1859  . oxyCODONE (Oxy IR/ROXICODONE) immediate release tablet 15 mg  15 mg Oral Q6H PRN  Epifanio Lesches, MD   15 mg at 03/28/17 1127  . pantoprazole (PROTONIX) EC tablet 40 mg  40 mg Oral BID AC Kalisetti, Radhika, MD      . potassium chloride SA (K-DUR,KLOR-CON) CR tablet 20 mEq  20 mEq Oral BID Loletha Grayer, MD   20 mEq at 03/28/17 1119  . pregabalin (LYRICA) capsule 150 mg  150 mg Oral BID Gladstone Lighter, MD   150 mg at 03/28/17 1118  . sertraline (ZOLOFT) tablet 150 mg  150 mg Oral QHS Epifanio Lesches, MD   150 mg at 03/27/17 2152  . tiZANidine (ZANAFLEX) tablet 2 mg  2 mg Oral TID Gladstone Lighter, MD   2 mg at 03/28/17 1118  . traZODone (DESYREL) tablet 25 mg  25 mg Oral QHS PRN Epifanio Lesches, MD  Musculoskeletal: Strength & Muscle Tone: decreased Gait & Station: unable to stand Patient leans: N/A  Psychiatric Specialty Exam: Physical Exam  Nursing note and vitals reviewed. Constitutional: She appears well-developed. She appears distressed.  HENT:  Head: Normocephalic and atraumatic.  Eyes: Conjunctivae are normal. Pupils are equal, round, and reactive to light.  Neck: Normal range of motion.  Cardiovascular: Regular rhythm and normal heart sounds.   Respiratory: Effort normal.  GI: Soft.  Musculoskeletal: Normal range of motion.       Arms: Neurological: She is alert.  Skin: Skin is warm and dry.  Psychiatric: Her speech is delayed. She is slowed. Cognition and memory are normal. She expresses inappropriate judgment. She exhibits a depressed mood. She expresses suicidal ideation. She expresses no suicidal plans.    Review of Systems  Constitutional: Positive for malaise/fatigue and weight loss.  HENT: Negative.   Eyes: Negative.   Respiratory: Negative.   Cardiovascular: Negative.   Gastrointestinal: Positive for diarrhea.  Musculoskeletal: Negative.   Skin: Negative.   Neurological: Positive for weakness.  Psychiatric/Behavioral: Positive for depression and suicidal ideas. Negative for hallucinations, memory loss and  substance abuse. The patient is nervous/anxious and has insomnia.     Blood pressure 136/82, pulse (!) 50, temperature 98.2 F (36.8 C), temperature source Oral, resp. rate 20, height 5\' 6"  (1.676 m), weight 52.7 kg (116 lb 3.2 oz), SpO2 100 %.Body mass index is 18.76 kg/m.  General Appearance: Disheveled  Eye Contact:  Minimal  Speech:  Slow  Volume:  Decreased  Mood:  Depressed  Affect:  Constricted and Depressed  Thought Process:  Coherent  Orientation:  Full (Time, Place, and Person)  Thought Content:  Rumination  Suicidal Thoughts:  Yes.  without intent/plan  Homicidal Thoughts:  No  Memory:  Immediate;   Good Recent;   Fair Remote;   Fair  Judgement:  Fair  Insight:  Shallow  Psychomotor Activity:  Decreased  Concentration:  Concentration: Fair  Recall:  [employer] of Knowledge:  Fair  Language:  Fair  Akathisia:  No  Handed:  Right  AIMS (if indicated):     Assets:  Financial Resources/Insurance Housing  ADL's:  Impaired  Cognition:  Impaired,  Mild  Sleep:        Treatment Plan Summary: Daily contact with patient to assess and evaluate symptoms and progress in treatment, Medication management and Plan Tolerated increase in mirtazapine. No further changes to medicine. Reviewed medicine plan with the patient and did supportive therapy and encouragement. I will follow up while she is in the hospital as needed.  Disposition: Patient does not meet criteria for psychiatric inpatient admission. Supportive therapy provided about ongoing stressors.  Alethia Berthold, MD 03/28/2017 4:47 PM

## 2017-03-28 NOTE — Progress Notes (Signed)
Patient transferred to [facility] room 210. Report called prior to transport to [facility] and all questions answered. All of patient's belongings sent with patient upon transfer. Earleen Reaper, RN

## 2017-03-28 NOTE — Progress Notes (Signed)
Physical Therapy Treatment Patient Details Name: Lindsay [name] MRN: [id number] DOB: 10-13-1956 Today's Date: 03/28/2017    History of Present Illness 61 y.o. female with a painful R hip after passing out in wc at home was cleared for mobility by orthopedist, no fracture.  Transfused, now no longer orthostatic.  R wrist drop, splinted.  PMHx:  atrial fibrillation, fibromyalgia, chronic pain syndrome, falls x last 2 weeks, no use of her right arm from before, disc herniation at C6-7, neuropathy and CHF    PT Comments    Pt agreeable to PT, but continues frustrated due to blood pressure issues and "severe" R sided headache. Pt orthostatic blood pressure taken supine: 117/73, seated: 104/63, stand: 66/42. Deferred ambulation. Pt noted to hold on during stand. Pt returned to bed for supine exercises, and participates well. Continue PT to progress strength, endurance and upright position tolerance to improve all functional mobility safely.    Follow Up Recommendations  SNF     Equipment Recommendations  None recommended by PT    Recommendations for Other Services       Precautions / Restrictions Precautions Precautions: Fall;Other (comment) Restrictions Weight Bearing Restrictions: No    Mobility  Bed Mobility Overal bed mobility: Modified Independent Bed Mobility: Supine to Sit;Sit to Supine     Supine to sit: Modified independent (Device/Increase time) Sit to supine: Modified independent (Device/Increase time)   General bed mobility comments: Min use of rail; mild increased time  Transfers Overall transfer level: Needs assistance Equipment used: None Transfers: Sit to/from Stand Sit to Stand: Min guard         General transfer comment: Holds rail to stand and during stand  Ambulation/Gait             General Gait Details: Not tested due to BP   Stairs            Wheelchair Mobility    Modified Rankin (Stroke Patients Only)       Balance                                            Cognition Arousal/Alertness: Awake/alert Behavior During Therapy: WFL for tasks assessed/performed Overall Cognitive Status: Within Functional Limits for tasks assessed                                        Exercises General Exercises - Lower Extremity Ankle Circles/Pumps: AROM;Both;Supine;20 reps Quad Sets: Strengthening;Both;10 reps;Supine Gluteal Sets: Strengthening;Both;10 reps;Supine Short Arc Quad: AROM;Both;15 reps;Supine Heel Slides: AROM;Both;15 reps;Supine Hip ABduction/ADduction: AROM;Both;15 reps;Other reps (comment) Straight Leg Raises: AAROM;Both;15 reps;Supine    General Comments        Pertinent Vitals/Pain Pain Assessment: No/denies pain    Home Living                      Prior Function            PT Goals (current goals can now be found in the care plan section) Progress towards PT goals: Progressing toward goals (slowly due to orthostatic BP)    Frequency    Min 2X/week      PT Plan Current plan remains appropriate    Co-evaluation              AM-PAC PT "  6 Clicks" Daily Activity  Outcome Measure  Difficulty turning over in bed (including adjusting bedclothes, sheets and blankets)?: None Difficulty moving from lying on back to sitting on the side of the bed? : None Difficulty sitting down on and standing up from a chair with arms (e.g., wheelchair, bedside commode, etc,.)?: A Little Help needed moving to and from a bed to chair (including a wheelchair)?: A Little Help needed walking in hospital room?: A Little Help needed climbing 3-5 steps with a railing? : A Lot 6 Click Score: 19    End of Session Equipment Utilized During Treatment: Gait belt Activity Tolerance: Other (comment) (limited by orthostatic BP) Patient left: in bed;with call bell/phone within reach;with bed alarm set   PT Visit Diagnosis: Unsteadiness on feet (R26.81)     Time:  4128-2081 PT Time Calculation (min) (ACUTE ONLY): 30 min  Charges:  $Therapeutic Exercise: 8-22 mins $Therapeutic Activity: 8-22 mins                    G CodesLarae Grooms, PTA 03/28/2017, 1:11 PM

## 2017-03-28 NOTE — Progress Notes (Signed)
Nutrition Follow-up  DOCUMENTATION CODES:   Severe malnutrition in context of chronic illness  INTERVENTION:  Provide Boost Breeze po TID, each supplement provides 250 kcal and 9 grams of protein.   Encouraged adequate intake of calories and protein at meals.   In addition to vitamins/minerals on home medication list, patient would benefit from bariatric multivitamin with iron daily (or two regular multivitamins), and 1500 mg calcium citrate due to history of gastric bypass.  NUTRITION DIAGNOSIS:   Malnutrition (Severe) related to chronic illness as evidenced by severe depletion of body fat, severe depletion of muscle mass.  Ongoing.  GOAL:   Patient will meet greater than or equal to 90% of their needs  Progressing.  MONITOR:   PO intake, I & O's, Labs, Supplement acceptance  REASON FOR ASSESSMENT:   Malnutrition Screening Tool    ASSESSMENT:   Lindsay [name]  is a 61 y.o. female with a known history ofPossible medical problems of chronic atrial fibrillation, fibromyalgia, chronic pain syndrome had a syncopal episode in the bathroom today.she called 911 and EMS  Brought her.. She requested to go to [facility] ,Patient has been accepted at [facility], but they don't have beds for next 24 hours.  -Patient was found to have right wrist drop. No acute findings on MRI. In splint. -Surgery was consulted for complaint of rectal prolapse. Per note found no tissue protruding and suspects may be hemorrhoidal tissue prolapsing. No surgical intervention needed.  Spoke with patient at bedside. She reports her appetite remains poor. She denies any N/V or abdominal pain. Reports she is having pain with her bowel movements and she is afraid to eat because of that. Also reports food is not coming as she likes it (noted multiple orders of trays at each meal) and then it will get cold and she cannot finish it. Unable to provide exact details of intake. Per chart has varied between 0-100%. She was eating  bacon and french toast at time of assessment. Right hand currently in splint (for wrist drop) and patient reports she eats with her left hand. During assessment she was able to cut french toast well with fork in left hand. She is not receiving Magic Cup on trays, but reports she does not like any milk-like supplement, anyway. Amenable to trying Boost Breeze.   Discussed more regarding patient's history of Roux-en-Y gastric bypass. She reports it was a "long time ago" but unable to provide year. She lost weight down to 145 lbs after that, which she reports she stayed at until she got breast cancer. Reports she still takes vitamins and minerals as prescribed and it should be on her home medication list.   Medications reviewed and include: Remeron, morphine Q8hrs, pantoprazole, potassium chloride 20 mEq BID.  Vitamins/Minerals on home meds: vitamin D3 1000 units daily, potassium chloride 20 mEq BID, vitamin B12 1000 micrograms daily (does not specify if it is sublingual), vitamin C 500 mg daily.  Labs reviewed: CBG 98.   Weight trend: 52.7 kg (+1.9 kg from weight on 5/17)  Discussed with RN. Patient able to have stool softener PRN.  Diet Order:  Diet regular Room service appropriate? Yes; Fluid consistency: Thin  Skin:  Reviewed, no issues  Last BM:  03/27/2017 - type 6 per chart  Height:   Ht Readings from Last 1 Encounters:  03/22/17 5\' 6"  (1.676 m)    Weight:   Wt Readings from Last 1 Encounters:  03/27/17 116 lb 3.2 oz (52.7 kg)    Ideal  Body Weight:  59.09 kg  BMI:  Body mass index is 18.76 kg/m.  Estimated Nutritional Needs:   Kcal:  1500-1800 calories  Protein:  61-76 gm  Fluid:  >/= 1.5L  EDUCATION NEEDS:   No education needs identified at this time  Willey Blade, MS, RD, LDN Pager: [phone] After Hours Pager: [phone]

## 2017-03-28 NOTE — Care Management (Signed)
[facility] accepted home health referral

## 2017-03-29 DIAGNOSIS — R52 Pain, unspecified: Secondary | ICD-10-CM

## 2017-03-29 LAB — BASIC METABOLIC PANEL
Anion gap: 4 — ABNORMAL LOW (ref 5–15)
BUN: 13 mg/dL (ref 6–20)
CO2: 23 mmol/L (ref 22–32)
CREATININE: 0.74 mg/dL (ref 0.44–1.00)
Calcium: 8.4 mg/dL — ABNORMAL LOW (ref 8.9–10.3)
Chloride: 114 mmol/L — ABNORMAL HIGH (ref 101–111)
GFR calc non Af Amer: >60 mL/min (ref >60)
GLUCOSE: 92 mg/dL (ref 65–99)
Potassium: 3.5 mmol/L (ref 3.5–5.1)
Sodium: 141 mmol/L (ref 135–145)

## 2017-03-29 LAB — GLUCOSE, CAPILLARY: Glucose-Capillary: 79 mg/dL (ref 65–99)

## 2017-03-29 MED ORDER — IOPAMIDOL (ISOVUE-300) INJECTION 61%: 15.0000 mL | INTRAVENOUS | Status: AC

## 2017-03-29 MED ORDER — PYRIDOSTIGMINE BROMIDE 60 MG PO TABS
30.0000 mg | ORAL_TABLET | Freq: Three times a day (TID) | ORAL | Status: DC
  Administered 2017-03-30 – 2017-04-02 (×10): 30 mg via ORAL
  Filled 2017-03-29 (×5): qty 1
  Filled 2017-03-29: qty 0.5
  Filled 2017-03-29 (×3): qty 1
  Filled 2017-03-29: qty 0.5
  Filled 2017-03-29: qty 1
  Filled 2017-03-29: qty 0.5
  Filled 2017-03-29: qty 1

## 2017-03-29 NOTE — Progress Notes (Signed)
OT Cancellation Note  Patient Details Name: Lindsay [name] MRN: [id number] DOB: 1956/06/06   Cancelled Treatment:    Reason Eval/Treat Not Completed: Patient declined, no reason specified. Pt curled in fetal position on L side with emesis bag in her arms by her head, grimacing and reporting significant pain and nausea and requesting nursing to assist her with getting cleaned up and requesting medications for her pain and nausea. Notified CNA of pt's request and let pt know this was done. Pt requested OT come back tomorrow to re-attempt OT treatment, as pt is not well enough to participate this afternoon. Will continue to monitor and re-attempt next day.  Jeni Salles, MPH, MS, OTR/L ascom [phone] 03/29/17, 2:49 PM

## 2017-03-29 NOTE — Progress Notes (Signed)
PT Cancellation Note  Patient Details Name: Lindsay [name] MRN: [id number] DOB: 1956-01-14   Cancelled Treatment:    Reason Eval/Treat Not Completed: Patient declined, no reason specified;Other (comment). Treatment attempted; pt refuses PT noting she is sick. Pt currently writhing in bed in fetal position, tearful and complaining of stomach pain/nausea. Pt requesting the nurse. Although nursing notes reveal awareness of situation this morning, nursing called and updated on pt current state. Re attempt treatment at a later time/date.    Larae Grooms, PTA 03/29/2017, 12:10 PM

## 2017-03-29 NOTE — Progress Notes (Signed)
[facility] [place] NAME: Lindsay [name]    MR#:  [id number]  DATE OF BIRTH:  25-Apr-1956  SUBJECTIVE:  CHIEF COMPLAINT:   Chief Complaint  Patient presents with  . Fall   -nauseous today, crying this morning - had an episode of chest pain yesterday  REVIEW OF SYSTEMS:  Review of Systems  Constitutional: Negative for chills, fever and malaise/fatigue.  HENT: Negative for congestion, ear discharge, hearing loss and nosebleeds.   Eyes: Negative for blurred vision.  Respiratory: Negative for cough, shortness of breath and wheezing.   Cardiovascular: Positive for leg swelling. Negative for chest pain and palpitations.  Gastrointestinal: Positive for abdominal pain and nausea. Negative for constipation, diarrhea and vomiting.  Genitourinary: Negative for dysuria.  Musculoskeletal: Positive for joint pain and myalgias.  Neurological: Negative for dizziness, sensory change, speech change, focal weakness, seizures and headaches.    DRUG ALLERGIES:   Allergies  Allergen Reactions  . Shrimp [Shellfish Allergy] Anaphylaxis    VITALS:  Blood pressure (!) 148/112, pulse 96, temperature 97.9 F (36.6 C), temperature source Oral, resp. rate 16, height 5\' 6"  (1.676 m), weight 53.8 kg (118 lb 11.2 oz), SpO2 100 %.  PHYSICAL EXAMINATION:  Physical Exam  GENERAL:  61 y.o.-year-old Chronically ill appearing patient lying in the bed with no acute distress.  EYES: Pupils equal, round, reactive to light and accommodation. No scleral icterus. Extraocular muscles intact.  HEENT: Head atraumatic, normocephalic. Oropharynx and nasopharynx clear.  NECK:  Supple, no jugular venous distention. No thyroid enlargement, no tenderness.  LUNGS: Normal breath sounds bilaterally, no wheezing, rales,rhonchi or crepitation. No use of accessory muscles of respiration.  CARDIOVASCULAR: S1, S2 normal. No rubs, or gallops. 2/6 systolic murmur is present ABDOMEN:  Soft, nontender, nondistended. Bowel sounds present. No organomegaly or mass.  No prolapse on my exam- reduced last night. Dry skin noted. EXTREMITIES: No  cyanosis, or clubbing. 1+ pedal edema noted NEUROLOGIC: Cranial nerves II through XII are intact. Muscle strength 5/5 in both upper extremities, 3/5 in lower extremities. Sensation intact. Gait not checked.  PSYCHIATRIC: The patient is alert and oriented x 2-3,  poor judgment. Very tearful today SKIN: No obvious rash, lesion, or ulcer.    LABORATORY PANEL:   CBC  Recent Labs Lab 03/28/17 0430  WBC 7.9  HGB 8.8*  HCT 26.8*  PLT 224   ------------------------------------------------------------------------------------------------------------------  Chemistries   Recent Labs Lab 03/22/17 1421  03/29/17 0440  NA 145  < > 141  K 3.5  < > 3.5  CL 115*  < > 114*  CO2 23  < > 23  GLUCOSE 78  < > 92  BUN 31*  < > 13  CREATININE 1.25*  < > 0.74  CALCIUM 8.1*  < > 8.4*  AST 27  --   --   ALT 16  --   --   ALKPHOS 114  --   --   BILITOT 0.8  --   --   < > = values in this interval not displayed. ------------------------------------------------------------------------------------------------------------------  Cardiac Enzymes  Recent Labs Lab 03/28/17 2029  TROPONINI <0.03   ------------------------------------------------------------------------------------------------------------------  RADIOLOGY:  No results found.  EKG:   Orders placed or performed during the hospital encounter of 03/22/17  . ED EKG  . ED EKG  . EKG 12-Lead  . EKG 12-Lead    ASSESSMENT AND PLAN:   61 year old female with multiple medical problems including chronic systolic CHF with last EF  of 45-50%, anemia of chronic disease, recent history of C. difficile colitis, history of DVT status post IVC, fibromyalgia, right hip avascular necrosis, right wrist drop, non-ischemic cardiomyopathy, malnutrition  presents to the hospital secondary to  fall and syncope.  #1 falls and weakness-secondary to significant orthostatic hypotension. -on midodrine, also on Florinef.  -Continue checking orthostatics. No improvement with fluid bolus either- received epogen yesterday but with new onset chest pain- being discontinued. start pyridostigmine as they have helped in some studies - will need endocrinology f/u and ACTH stimulation tests as outpt -Physical therapy and OT consult recommended rehabilitation at discharge.  #2 right wrist drop-MRI of C-spine did not show any acute cardiac compression. Appreciate neurology consult. -Currently in a splint. Outpatient NCS/EMG study is recommended.  #3 right hip avascular necrosis-appreciate orthopedic consult. Weightbearing as tolerated recommended. -Non-urgent surgery as outpatient.  #4 history of paroxysmal atrial fibrillation-rate is controlled. Continue flecainide. Not a candidate for anticoagulation.  Aspirin held due to potential GI bleed.  #5 peripheral neuropathy has been on Lyrica- decrease dose due to hypotension. Has muscle spasms. Zanaflex is restarted at a lower dose.  #6 congestive heart failure-chronic systolic dysfunction. Not in any acute exacerbation. If needed IV fluids will be given.  monitor carefully.  #7 history of breast cancer-continue anastrozole  #8.Rectal prolapse vs internal hemorrhoids, none on exam today - appreciate surgical consult, no acute indication for surgery, f/u at [facility] - anusol suppositories for now  #9 DVT prophylaxis-on lovenox  #10 Depression- appreciate psych consult, increased remeron dose.  palliative care has been consulted to address goals of treatment   All the records are reviewed and case discussed with Care Management/Social Workerr. Management plans discussed with the patient, family and they are in agreement.  CODE STATUS: Full code  TOTAL  TIME TAKING CARE OF THIS PATIENT: 42 minutes.   POSSIBLE D/C in 2 days, DEPENDING ON  CLINICAL CONDITION.   Gladstone Lighter M.D on 03/29/2017 at 12:17 PM  Between 7am to 6pm - Pager - [phone]  After 6pm go to [email or web address] - password [email or web address] [facility]  Office  [phone]  CC: Primary care physician; System, Pcp Not In

## 2017-03-29 NOTE — Progress Notes (Signed)
Daily Progress Note   Patient Name: Lindsay [name]       Date: 03/29/2017 DOB: 1955-11-21  Age: 61 y.o. MRN#: [id number] Attending Physician: Gladstone Lighter, MD Primary Care Physician: System, Pcp Not In Admit Date: 03/22/2017  Reason for Consultation/Follow-up: Establishing goals of care  Subjective: Upon arrival to room, patient resting. Wakes easily to voice. Complains of pain and nausea this morning. Notified RN to give prn oxycodone and zofran.   Patient wants to go home when discharged. She misses her dogs. She is withdrawn. "Do not feel like talking." Reassured my continued support throughout hospitalization.   Length of Stay: 6  Current Medications: Scheduled Meds:  . anastrozole  1 mg Oral Daily  . enoxaparin (LOVENOX) injection  40 mg Subcutaneous Q24H  . epoetin (EPOGEN/PROCRIT) injection  4,000 Units Subcutaneous Once per day on Tue Thu Sat  . feeding supplement  1 Container Oral TID BM  . flecainide  50 mg Oral Q12H  . fludrocortisone  0.2 mg Oral BID  . fluticasone  1 spray Each Nare Daily  . midodrine  10 mg Oral TID WC  . mirtazapine  30 mg Oral QHS  . morphine  15 mg Oral Q8H  . multivitamin with minerals  1 tablet Oral Daily  . pantoprazole  40 mg Oral BID AC  . potassium chloride  20 mEq Oral BID  . pregabalin  150 mg Oral BID  . sertraline  150 mg Oral QHS  . tiZANidine  2 mg Oral TID    Continuous Infusions:  PRN Meds: acetaminophen **OR** acetaminophen, bisacodyl, clonazePAM, hydrocortisone, loperamide, loratadine, ondansetron **OR** ondansetron (ZOFRAN) IV, oxyCODONE, traZODone  Physical Exam  Constitutional: She is oriented to person, place, and time. She is cooperative. She appears ill.  HENT:  Head: Normocephalic and atraumatic.    Pulmonary/Chest: Effort normal.  Abdominal: Normal appearance.  Neurological: She is alert and oriented to person, place, and time.  Skin: Skin is warm and dry.  Psychiatric: Her speech is delayed. She is withdrawn. She exhibits a depressed mood.  Nursing note and vitals reviewed.          Vital Signs: BP 116/73   Pulse 61   Temp 97.9 F (36.6 C) (Oral)   Resp 16   Ht 5\' 6"  (1.676 m)   Wt 53.8 kg (118  lb 11.2 oz)   SpO2 100%   BMI 19.16 kg/m  SpO2: SpO2: 100 % O2 Device: O2 Device: Not Delivered O2 Flow Rate:    Intake/output summary:   Intake/Output Summary (Last 24 hours) at 03/29/17 1048 Last data filed at 03/29/17 0505  Gross per 24 hour  Intake                0 ml  Output                0 ml  Net                0 ml   LBM: Last BM Date: 03/27/17 Baseline Weight: Weight: 40.8 kg (90 lb) Most recent weight: Weight: 53.8 kg (118 lb 11.2 oz)   Palliative Assessment/Data: PPS 50%   Flowsheet Rows     Most Recent Value  Intake Tab  Referral Department  Hospitalist  Unit at Time of Referral  Cardiac/Telemetry Unit  Palliative Care Primary Diagnosis  Pain  Date Notified  03/23/17  Palliative Care Type  New Palliative care  Reason for referral  Clarify Goals of Care, Pain  Date of Admission  03/22/17  Date first seen by Palliative Care  03/27/17  # of days Palliative referral response time  4 Day(s)  # of days IP prior to Palliative referral  1  Clinical Assessment  Palliative Performance Scale Score  50%  Psychosocial & Spiritual Assessment  Palliative Care Outcomes  Patient/Family meeting held?  Yes  Who was at the meeting?  patient  Palliative Care Outcomes  Clarified goals of care, ACP counseling assistance, Provided psychosocial or spiritual support, Improved pain interventions      Patient Active Problem List   Diagnosis Date Noted  . Severe recurrent major depression without psychotic features ([place]) 03/27/2017  . Palliative care by specialist   .  Goals of care, counseling/discussion   . Hemorrhoid prolapse   . Anemia 03/23/2017  . Right arm weakness 01/05/2017  . Chest pain 05/29/2016  . Elevated troponin 05/29/2016  . Atrial flutter ([place]) 05/29/2016  . Postural hypotension   . Moderate mitral regurgitation   . PAF (paroxysmal atrial fibrillation) ([place])   . Malnutrition ([place])   . S/P gastric bypass   . Fibromyalgia   . Peripheral neuropathy   . NICM (nonischemic cardiomyopathy) ([place])   . Chronic systolic CHF (congestive heart failure) ([place])   . Recurrent major depressive disorder, in partial remission ([place])   . Major depression 08/26/2015  . Weakness of both legs   . Absolute anemia   . Pain in the chest   . Orthostatic hypotension   . Paroxysmal atrial fibrillation ([facility])   . Syncope and collapse   . Paroxysmal A-fib ([place])   . Atrial fibrillation ([place]) 05/24/2015  . Abdominal pain 05/23/2015  . C. difficile colitis 12/12/2011  . Chest pain 11/11/2011  . Syncope 11/11/2011  . Hypotension 11/11/2011  . Cardiomyopathy 11/11/2011  . AF (atrial fibrillation) ([place]) 11/11/2011    Palliative Care Assessment & Plan   Patient Profile: 61 y.o. female  with past medical history of breast cancer, gastric bypass, systolic CHF, orthostatic hypotension, nonischemic cardiomyopathy, atrial fibrillation, avascular necrosis of right hip, DVT s/p IVC filter, right wrist drop, malnutrition, chronic pain syndrome, fibromyalgia, depression, and peripheral neuropathy admitted on 03/22/2017 with weakness and recurrent falls. Followed by [facility] doctors at [facility]. On 5/20 evening, patient had rectal prolapse. Surgical consult pending. Patient with orthostatic hypotension on midodrine and florinef.  Neurology following for right wrist drop. MRI c-spine negative for acute cardiac compression. Pending non-urgent surgery for avascular necrosis at [facility]. Palliative medicine consultation for goals of care.      Assessment: Orthostatic  hypotension Depression Fibromyalgia Right wrist drop Right hip avascular necrosis Paroxysmal atrial fibrillation Peripheral neuropathy Congestive heart failure Rectal prolapse vs. Internal hemorrhoids  Recommendations/Plan:  Patient requests to remain a FULL code--intubation/mechanical ventilation for a time trial. Long term, she would not want her life prolonged by heroic measures.   Continue medications as needed for pain/nausea/anxiety/sleep.  Encouraged her to consider completing advanced directives and designating a HCPOA.   May benefit from palliative services to follow on discharge.  Goals of Care and Additional Recommendations:  Limitations on Scope of Treatment: Full Scope Treatment  Code Status: FULL   Code Status Orders        Start     Ordered   03/22/17 1611  Full code  Continuous     03/22/17 1612    Code Status History    Date Active Date Inactive Code Status Order ID Comments User Context   01/05/2017  4:48 PM 01/06/2017  9:37 PM Full Code [id number]  Demetrios Loll, MD Inpatient   05/29/2016  4:24 PM 05/30/2016  8:01 AM Full Code [id number]  Idelle Crouch, MD Inpatient   08/23/2015  4:14 PM 08/27/2015  7:54 PM Full Code [id number]  Fritzi Mandes, MD ED   05/23/2015 11:00 AM 05/26/2015  3:23 PM Full Code [id number]  Henreitta Leber, MD Inpatient       Prognosis:   Unable to determine  Discharge Planning:  To Be Determined likely home with home health  Care plan was discussed with patient and RN  Thank you for allowing the Palliative Medicine Team to assist in the care of this patient.   Time In: 1010 Time Out: 1035 Total Time 25 Prolonged Time Billed  no      Greater than 50%  of this time was spent counseling and coordinating care related to the above assessment and plan.  Ihor Dow, FNP-C Palliative Medicine Team  Phone: [phone] Fax: [phone]  Please contact Palliative Medicine Team phone at [phone] for questions and concerns.

## 2017-03-29 NOTE — Progress Notes (Signed)
Patient c/o nausea. NT and Nurse Student at bedside obtaining orthostatic vital signs. PRN Zofran given per order. ELQ

## 2017-03-29 NOTE — Care Management (Signed)
Confirmed with patient that she is still agreeable to home health services with [facility] at discharge.

## 2017-03-29 NOTE — Significant Event (Signed)
Rapid Response Event Note  Overview: Time Called: 2014 Arrival Time: 2015 Event Type: Other (Comment)  Initial Focused Assessment: RR called for patient experiencing periodic episodes of crying while stating her chest hurts.  Patient lying in bed in no acute distress at time of RR team arrival.  Patient's VSS; BP 120/80 HR 55; O2 100% on room air.  Interventions: Stat troponin level and EKG ordered by MD. MD at bedside to assess patient.    Plan of Care (if not transferred): MD to assess patient and place orders. Bedside RN to call if further assistance from RR team is needed.  Event Summary: Name of Physician Notified: Dr. Jannifer Franklin at 2015  Outcome: Stayed in room and stabalized  Event End Time: 2031  Zettie Pho

## 2017-03-29 NOTE — Significant Event (Signed)
Rapid Response called by covering nurse at 2010 due to patient becoming unresponsive after tech attempted to take vitals.  Patient was found holding her chest, crying  And complaining with pain.   Response team arrived @ 2015 RN responded to team questions and informed Dr. Jannifer Franklin of status prior to event.  No measurable distress, MD ordered Troponin levels to be drawn stat.  Patient was placed on telemetry per MD order. RN instructed to call with any further changes in status. RR team left room @20 :35 Patient verbalized anxiety over her perceived belief that staff would drop her.  RN reiterated team response to needs and reassured patient.

## 2017-03-29 NOTE — Progress Notes (Signed)
CCMD notified nurse of pulse rate of 96 decreasing to 50's. Confirmed not new for patient. Patient with previously noted low heart ratre. Patient alert and oriented to self. Patient with no signs of distress. No difficulty breathing. ELQ

## 2017-03-30 ENCOUNTER — Inpatient Hospital Stay: Payer: Medicare Other

## 2017-03-30 DIAGNOSIS — R55 Syncope and collapse: Secondary | ICD-10-CM

## 2017-03-30 DIAGNOSIS — M8700 Idiopathic aseptic necrosis of unspecified bone: Secondary | ICD-10-CM

## 2017-03-30 DIAGNOSIS — R627 Adult failure to thrive: Secondary | ICD-10-CM

## 2017-03-30 DIAGNOSIS — I951 Orthostatic hypotension: Principal | ICD-10-CM

## 2017-03-30 DIAGNOSIS — D530 Protein deficiency anemia: Secondary | ICD-10-CM

## 2017-03-30 DIAGNOSIS — I480 Paroxysmal atrial fibrillation: Secondary | ICD-10-CM

## 2017-03-30 DIAGNOSIS — M25559 Pain in unspecified hip: Secondary | ICD-10-CM

## 2017-03-30 LAB — GLUCOSE, CAPILLARY: GLUCOSE-CAPILLARY: 91 mg/dL (ref 65–99)

## 2017-03-30 MED ORDER — LOPERAMIDE HCL 2 MG PO CAPS
2.0000 mg | ORAL_CAPSULE | Freq: Two times a day (BID) | ORAL | Status: DC
  Administered 2017-03-30 – 2017-03-31 (×2): 2 mg via ORAL
  Filled 2017-03-30 (×5): qty 1

## 2017-03-30 MED ORDER — ONDANSETRON HCL 4 MG/2ML IJ SOLN
4.0000 mg | Freq: Four times a day (QID) | INTRAMUSCULAR | Status: DC | PRN
  Administered 2017-03-30 – 2017-04-02 (×8): 4 mg via INTRAVENOUS
  Filled 2017-03-30 (×8): qty 2

## 2017-03-30 MED ORDER — TIZANIDINE HCL 4 MG PO TABS
2.0000 mg | ORAL_TABLET | Freq: Three times a day (TID) | ORAL | Status: DC | PRN
  Administered 2017-03-30: 2 mg via ORAL
  Filled 2017-03-30: qty 1

## 2017-03-30 MED ORDER — ALUM & MAG HYDROXIDE-SIMETH 200-200-20 MG/5ML PO SUSP
30.0000 mL | ORAL | Status: DC | PRN
  Administered 2017-03-30 – 2017-04-02 (×3): 30 mL via ORAL
  Filled 2017-03-30 (×3): qty 30

## 2017-03-30 MED ORDER — DEXTROSE-NACL 5-0.45 % IV SOLN
INTRAVENOUS | Status: DC
  Administered 2017-03-30 – 2017-04-02 (×6): via INTRAVENOUS

## 2017-03-30 MED ORDER — IOPAMIDOL (ISOVUE-300) INJECTION 61%
75.0000 mL | Freq: Once | INTRAVENOUS | Status: AC | PRN
  Administered 2017-03-30: 75 mL via INTRAVENOUS

## 2017-03-30 NOTE — Consult Note (Signed)
Cardiology Consultation Note  Patient ID: Lindsay [name], MRN: [id number], DOB/AGE: [birth year] 59 y.o. Admit date: 03/22/2017   Date of Consult: 03/30/2017 Primary Physician: System, Pcp Not In Primary Cardiologist: [place] Requesting Physician: Dr. Harriet Masson, MD  Chief Complaint: LOC Reason for Consult: Refractory orthostatic hypotension  HPI: Lindsay [name] is a 61 y.o. female who is being seen today for the evaluation of refractory orthostatic hypotension at the request of Dr. Tressia Miners, MD. Patient has a h/o NICM with prior EF of 303-5% now improved to 60% by TTE in 01/2017, PAF diagnosed in 2012 not on full dose anticoagulation by patient's choice on flecainide, chronic refractory orthostatic hypotension on midodrine and Florinef with multiple prior episodes of syncope felt to be 2/2 orthostasis, DVT in 07/2013 s/p IV filter, chronic diarrhea with prior C diff, breast cancer s/p chemotherapy with Docetaxol, cytoxan, and adriamycin in 2010, AVN of the right hip followed by [facility], chronic pain disorder on narcotics, anemia of chronic disease, fibromyalgia, CKD stage III, s/p bariatric surgery, chemotherapy-induced neuropathy who was admitted to [facility] on 5/16 with recurrent syncope felt to be in the setting of orthostasis.   She previously had several admissions for syncope in the setting of orthostasis with cardiac workup being unrevealing including inpatient and outpatient cardiac monitoring, nuclear stress testing, and echocardiography. Has also been admitted for seizure and opioid withdrawal. She was previously followed by Dr. Rockey Situ though transferred her care to [facility] in an effort to have all of her records localized.   She was again admitted 5/16 after suffering a syncopal episode in the bathroom in the setting of increased weakness for approximately 2 weeks prior. Work up thus far has been nondiagnostic. CT head and neck negative for acute process. MRI right hip given her AVN with severe surrounding  marrow edema. CBC upon admission with HGB 7.0 with a baseline of approximately 10-11. HGB has remained in the 8-9 range. Troponin negative x 2. K+ 3.5 upon admission-->3.5 at time of cardiology consult. Albumin 2.1. CK 80. BP has been stable supine though she becomes severely orthostatic when sitting and standing with associated dizziness. Has been treated with midodrine and florinef and now Mestinon as of today. Cardiology asked for further evaluation. She denies any chest pain, palpations, SOB, or current dizziness. Her only complaint at this time is an occipital headache and she is requesting more pain medication (last received morphine 30 minutes prior to cardiology seeing her).    Past Medical History:  Diagnosis Date  . Anemia of chronic disease   . C. difficile diarrhea 10/2011  . Cancer ([place])   . Chronic cough   . Chronic systolic CHF (congestive heart failure) ([place])    a. echo 05/2015: EF 45-50%, nl wall motion, mod MR, nl PASP  . Depression   . Depression   . Fibromyalgia   . History of DVT (deep vein thrombosis)    a. s/p IVC filter 2014  . HX: breast cancer    a. right-side; b. s/p lumpectomy & chemo w/ Docetaxol, cytoxan, adriamycin  . Kidney stones 2013  . Malnutrition ([place])    a. chronic diarrhea s/p most meals   . Moderate mitral regurgitation   . Muscle spasm   . NICM (nonischemic cardiomyopathy) ([place])    a. Lexiscan 08/2015: no sig ischemia, no ECG changes concerning for ischemia (baseline abnormal T wave along anterolateral leads), EF 61%, low risk scan  . Orthostatic hypotension   . PAF (paroxysmal atrial fibrillation) ([place])    a. not  on long term, full-dose anticoagulation since 2011, had high INR at that time 2/2 possible malnutition, has been off anticoagulation since; b. CHADS2VASc at least 2 (CHF, female); c. 48 hr Holter 09/2015 NSR w/ rare PACs, no significant PVCs, periods of sinus tachycarida with average heart rate typically in the 60-100 range. No sig  arrhythmia. No Afib seen.   . 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 Postural hypotension   . S/P gastric bypass   . Vitamin D deficiency       Most Recent Cardiac Studies: TTE 01/2017: Study Conclusions  - Left ventricle: The cavity size was normal. Systolic function was   normal. The estimated ejection fraction was in the range of 55%   to 65%. - Aortic valve: Valve area (Vmax): 2.34 cm^2.  Holter 09/2015: Study Highlights   48 hour Holter monitor Normal sinus rhythm, rare APCs, no significant PVCs,  periods of sinus tachycardia, Average heart rate typically in the 60 to 100 range No other significant arrhythmia noted No atrial fibrillation recorded     Surgical History:  Past Surgical History:  Procedure Laterality Date  . ABDOMINAL HYSTERECTOMY    . BREAST LUMPECTOMY     right @ [facility]  . ESOPHAGOGASTRODUODENOSCOPY (EGD) WITH PROPOFOL N/A 05/25/2015   Procedure: ESOPHAGOGASTRODUODENOSCOPY (EGD) WITH PROPOFOL;  Surgeon: Manya Silvas, MD;  Location: [facility] ENDOSCOPY;  Service: Endoscopy;  Laterality: N/A;  . LITHOTRIPSY  jan 2013     Home Meds: Prior to Admission medications   Medication Sig Start Date End Date Taking? Authorizing Provider  anastrozole (ARIMIDEX) 1 MG tablet Take 1 mg by mouth daily.     Yes [provider]  aspirin EC 81 MG tablet Take 81 mg by mouth daily.   Yes [provider]  Cholecalciferol (VITAMIN D3) 1000 units CAPS Take 1,000 Units by mouth daily.   Yes [provider]  clonazePAM (KLONOPIN) 0.5 MG tablet Take 0.5 mg by mouth at bedtime as needed for anxiety.    Yes [provider]  flecainide (TAMBOCOR) 100 MG tablet Take 0.5 tablets (50 mg total) by mouth every 12 (twelve) hours. 08/27/15  Yes Fritzi Mandes, MD  fludrocortisone (FLORINEF) 0.1 MG tablet Take 0.2 mg by mouth daily.   Yes [provider]  fluticasone (FLONASE) 50 MCG/ACT nasal spray Place 1 spray into the nose daily.     Yes [provider]  lidocaine (LIDODERM) 5 % Place 1-3 patches onto the skin daily. Remove & Discard patch within 12 hours or as directed by MD    Yes [provider]  loperamide (IMODIUM) 2 MG capsule Take 2 mg by mouth 4 (four) times daily as needed for diarrhea or loose stools.   Yes [provider]  loratadine (CLARITIN) 10 MG tablet Take 10 mg by mouth daily as needed for allergies.    Yes [provider]  midodrine (PROAMATINE) 5 MG tablet Take 5-10 mg by mouth 3 (three) times daily with meals. Take 10 mg in the morning, 5 mg mid day and 5 mg at night.   Yes [provider]  mirtazapine (REMERON) 15 MG tablet Take 15 mg by mouth at bedtime.     Yes [provider]  morphine (MS CONTIN) 15 MG 12 hr tablet Take 1 tablet (15 mg total) by mouth every 8 (eight) hours. 05/30/16  Yes Vaughan Basta, MD  omeprazole (PRILOSEC) 40 MG capsule Take 40 mg by mouth daily.   Yes [provider]  oxyCODONE (ROXICODONE) 15 MG immediate release tablet Take 15 mg by mouth every 6 (six) hours as needed for pain.   Yes [provider]  potassium chloride (K-DUR) 10 MEQ tablet Take 20 mEq by mouth 2 (two) times daily.   Yes [provider]  pregabalin (LYRICA) 150 MG capsule Take 150 mg by mouth 3 (three) times daily.   Yes [provider]  sertraline (ZOLOFT) 100 MG tablet Take 150 mg by mouth daily.    Yes [provider]  spironolactone (ALDACTONE) 25 MG tablet Take 25 mg by mouth daily.   Yes [provider]  tiZANidine (ZANAFLEX) 4 MG tablet Take 4 mg by mouth 3 (three) times daily.    Yes [provider]  triamcinolone cream (KENALOG) 0.1 % Apply 1 application topically 2 (two) times daily.   Yes [provider]  vitamin B-12 (CYANOCOBALAMIN) 1000 MCG tablet Take 1,000 mcg by mouth daily.    Yes [provider]  vitamin C (ASCORBIC ACID) 500 MG tablet Take 500 mg by  mouth daily.   Yes [provider]  cyclobenzaprine (FLEXERIL) 5 MG tablet Take 1 tablet (5 mg total) by mouth 3 (three) times daily as needed for muscle spasms. 03/25/17   Gladstone Lighter, MD  docusate sodium (COLACE) 100 MG capsule Take 1 capsule (100 mg total) by mouth 2 (two) times daily. 03/25/17   Gladstone Lighter, MD  midodrine (PROAMATINE) 10 MG tablet Take 1 tablet (10 mg total) by mouth 3 (three) times daily with meals. 03/25/17   Gladstone Lighter, MD    Inpatient Medications:  . anastrozole  1 mg Oral Daily  . enoxaparin (LOVENOX) injection  40 mg Subcutaneous Q24H  . feeding supplement  1 Container Oral TID BM  . flecainide  50 mg Oral Q12H  . fludrocortisone  0.2 mg Oral BID  . fluticasone  1 spray Each Nare Daily  . midodrine  10 mg Oral TID WC  . mirtazapine  30 mg Oral QHS  . morphine  15 mg Oral Q8H  . multivitamin with minerals  1 tablet Oral Daily  . pantoprazole  40 mg Oral BID AC  . potassium chloride  20 mEq Oral BID  . pregabalin  150 mg Oral BID  . pyridostigmine  30 mg Oral TID  . sertraline  150 mg Oral QHS     Allergies:  Allergies  Allergen Reactions  . Shrimp [Shellfish Allergy] Anaphylaxis    Social History   Social History  . Marital status: Widowed    Spouse name: N/A  . Number of children: N/A  . Years of education: N/A   Occupational History  . Not on file.   Social History Main Topics  . Smoking status: Former Smoker    Packs/day: 0.25    Years: 1.00    Types: Cigarettes    Quit date: 11/10/1985  . Smokeless tobacco: Never Used  . Alcohol use No  . Drug use: No  . Sexual activity: Not on file   Other Topics Concern  . Not on file   Social History Narrative  . No narrative on file     Family History  Problem Relation Age of Onset  . CAD Father        MI  . Diabetes Mellitus II Father   . Hypertension Father   . Diabetes Mellitus II Sister      Review of Systems: Review of Systems  Constitutional:  Positive for malaise/fatigue.  Negative for chills, diaphoresis, fever and weight loss.  HENT: Negative for congestion.   Eyes: Negative for discharge and redness.  Respiratory: Negative for cough, hemoptysis, sputum production, shortness of breath and wheezing.   Cardiovascular: Negative for chest pain, palpitations, orthopnea, claudication, leg swelling and PND.  Gastrointestinal: Negative for abdominal pain, blood in stool, heartburn, melena, nausea and vomiting.  Genitourinary: Negative for hematuria.  Musculoskeletal: Positive for falls. Negative for myalgias.  Skin: Negative for rash.  Neurological: Positive for dizziness, loss of consciousness, weakness and headaches. Negative for tingling, tremors, sensory change, speech change, focal weakness and seizures.  Endo/Heme/Allergies: Does not bruise/bleed easily.  Psychiatric/Behavioral: Negative for substance abuse. The patient is not nervous/anxious.   All other systems reviewed and are negative.   Labs:  Recent Labs  03/28/17 2029  TROPONINI <0.03   Lab Results  Component Value Date   WBC 7.9 03/28/2017   HGB 8.8 (L) 03/28/2017   HCT 26.8 (L) 03/28/2017   MCV 95.6 03/28/2017   PLT 224 03/28/2017     Recent Labs Lab 03/29/17 0440  NA 141  K 3.5  CL 114*  CO2 23  BUN 13  CREATININE 0.74  CALCIUM 8.4*  GLUCOSE 92   Lab Results  Component Value Date   CHOL 168 01/06/2017   HDL 69 01/06/2017   LDLCALC 84 01/06/2017   TRIG 75 01/06/2017   No results found for: DDIMER  Radiology/Studies:  Dg Chest 1 View  Result Date: 03/22/2017 IMPRESSION: No active disease. Electronically Signed   By: Monte Fantasia M.D.   On: 03/22/2017 15:25   Ct Head Wo Contrast  Result Date: 03/22/2017 IMPRESSION: 1. No acute intracranial abnormality. No acute traumatic injury identified. 2. Stable non contrast CT appearance of the brain. Little of the chronic cerebral white matter and pontine abnormality seen by MR is evident by CT.  Electronically Signed   By: Genevie Ann M.D.   On: 03/22/2017 15:26   Ct Cervical Spine Wo Contrast  Result Date: 03/22/2017 IMPRESSION: No fracture or spondylolisthesis. Osteoarthritic change at C6-7. No disc extrusion or stenosis evident. There are foci of carotid artery calcification bilaterally. Electronically Signed   By: Lowella Grip III M.D.   On: 03/22/2017 15:07   Mr Cervical Spine Wo Contrast  Result Date: 03/24/2017 IMPRESSION: 1. Unexpected dorsal cord signal throughout the cervical levels. The appearance is not typical for subacute combined degeneration, but would consider T12 and copper deficiency in this patient with history of malnutrition and gastric bypass surgery. Please see further discussion above. 2. C6-7 disc protrusion with mild spinal stenosis. No foraminal impingement to explain right arm symptoms. Electronically Signed   By: Monte Fantasia M.D.   On: 03/24/2017 13:29   Mr Hip Right Wo Contrast  Result Date: 03/24/2017 IMPRESSION: 1. Avascular necrosis of the right femoral head with severe surrounding marrow edema involving the femoral head and neck and a large joint effusion. No articular surface collapse. 2. 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 3. Mild osteoarthritis of the left hip. 4. No hip fracture or dislocation. Electronically Signed   By: Kathreen Devoid   On: 03/24/2017 13:14   Dg Hip Unilat With Pelvis 2-3 Views Right  Result Date: 03/23/2017 IMPRESSION: Degenerative changes with loss of joint space. No fracture or malalignment. Electronically Signed   By: Dorise Bullion III M.D   On: 03/23/2017 18:20    EKG: Interpreted by me showed: sinus bradycardia, 51 bpm, lateral infarct Telemetry: Interpreted by me  showed: sinus rhythm to sinus tachycardia   Weights: Filed Weights   03/27/17 0643 03/29/17 0508 03/30/17 0500  Weight: 116 lb 3.2 oz (52.7 kg) 118 lb 11.2 oz (53.8 kg) 113 lb 1.6 oz (51.3 kg)     Physical Exam: Blood pressure (!)  150/90, pulse 90, temperature 98.6 F (37 C), temperature source Oral, resp. rate 20, height 5\' 6"  (1.676 m), weight 113 lb 1.6 oz (51.3 kg), SpO2 100 %. Body mass index is 18.25 kg/m. General: Frail appearing, in no acute distress. Complains of occipital headache.  Head: Normocephalic, atraumatic, sclera non-icteric, no xanthomas, nares are without discharge.  Neck: Negative for carotid bruits. JVD not elevated. Lungs: Clear bilaterally to auscultation without wheezes, rales, or rhonchi. Breathing is unlabored. Heart: RRR with S1 S2. No murmurs, rubs, or gallops appreciated. Abdomen: Soft, non-tender, non-distended with normoactive bowel sounds. No hepatomegaly. No rebound/guarding. No obvious abdominal masses. Msk:  Strength and tone appear normal for age. Extremities: No clubbing or cyanosis. No edema. Distal pedal pulses are 2+ and equal bilaterally. Neuro: Alert and oriented X 3. No facial asymmetry. No focal deficit. Moves all extremities spontaneously. Psych:  Responds to questions appropriately with a normal affect.    Assessment and Plan:  Principal Problem:   Syncope Active Problems:   Hypotension   Paroxysmal A-fib (HCC)   Absolute anemia   NICM (nonischemic cardiomyopathy) (HCC)   Malnutrition (HCC)   S/P gastric bypass   Fibromyalgia   Anemia   Palliative care by specialist   Goals of care, counseling/discussion   Hemorrhoid prolapse   Severe recurrent major depression without psychotic features ([place])   Generalized pain   Failure to thrive in adult   AVN (avascular necrosis of bone) ([place])    1. Refractory orthostatic hypotension: -has been present for many years leading to several syncopal episodes -Previously managed on both midodrine and Florinef  -Now on Mestinon as well as of 5/24 -Consider droxidopa for possible neurogenic hypotension  -Consider abdominal binder and compression stockings  -IM has asked Neurology to evaluate as well -Possible refractory  nature she is currently presenting with at this time certainly may be multifactorial including the above as well as acute on chronic anemia with hgb of 8.8 currently, continued pain medication usage with patient asking for more medications and refusing to taper her narcotics  -Would sit patient up as much as possible, if possible in an effort to avoid supine hypertension  -Given Mestinon was just started today, could wait and see how she responds to this -Will need endocrinology workup if able to come off Florinef long enough  -Overall, this is a difficult situation  2. Failure to thrive/dehydration/diarrhea: -Albumin of 2.1, consider IV repletion to evaluate if this will help lessen some possible 3rd spacing and assist in her maintaining improved positional BP -Notes diarrhea, has a history of C diff  3. PAF: -Currently in sinus rhythm -She has previously refused full dose anticoagulation and has been managed with ASA 81 mg daily  -On flecainide   4. NICM: -She does not appear to be volume overloaded at this time -Not on BB/ACEi/ARB/Entresto given hypotension    -Arlyce Harman on hold given the above  5. Chronic hypokalemia: -Previously on spiro, on hold as above  6. Acute on chronic anemia: -As above  7. Occipital headache: -Asked RN to check BP to evaluate for possible supine hypertension given midodrine  -Cannot rule out adverse effect from Florinef at this time, consider holding to evaluate for improvement  in headache   8. Remaining per IM   Signed, Christell Faith, PA-C [facility] Pager: [phone] 03/30/2017, 3:43 PM

## 2017-03-30 NOTE — Progress Notes (Signed)
[place] [facility] [place] NAME: Lindsay [name]    MR#:  [id number]  DATE OF BIRTH:  03-Jul-1956  SUBJECTIVE:  CHIEF COMPLAINT:   Chief Complaint  Patient presents with  . Fall   -feels some better today- sitting and lying BP stable now- but standing BP drops significantly- refused meds yesterday due to nausea- monitor today  REVIEW OF SYSTEMS:  Review of Systems  Constitutional: Negative for chills, fever and malaise/fatigue.  HENT: Negative for congestion, ear discharge, hearing loss and nosebleeds.   Eyes: Negative for blurred vision.  Respiratory: Negative for cough, shortness of breath and wheezing.   Cardiovascular: Positive for leg swelling. Negative for chest pain and palpitations.  Gastrointestinal: Positive for abdominal pain and nausea. Negative for constipation, diarrhea and vomiting.  Genitourinary: Negative for dysuria.  Musculoskeletal: Positive for joint pain and myalgias.  Neurological: Negative for dizziness, sensory change, speech change, focal weakness, seizures and headaches.    DRUG ALLERGIES:   Allergies  Allergen Reactions  . Shrimp [Shellfish Allergy] Anaphylaxis    VITALS:  Blood pressure (!) 66/45, pulse 90, temperature 98.6 F (37 C), temperature source Oral, resp. rate 20, height 5\' 6"  (1.676 m), weight 51.3 kg (113 lb 1.6 oz), SpO2 100 %.  PHYSICAL EXAMINATION:  Physical Exam  GENERAL:  61 y.o.-year-old Chronically ill appearing patient lying in the bed with no acute distress.  EYES: Pupils equal, round, reactive to light and accommodation. No scleral icterus. Extraocular muscles intact.  HEENT: Head atraumatic, normocephalic. Oropharynx and nasopharynx clear.  NECK:  Supple, no jugular venous distention. No thyroid enlargement, no tenderness.  LUNGS: Normal breath sounds bilaterally, no wheezing, rales,rhonchi or crepitation. No use of accessory muscles of respiration.  CARDIOVASCULAR: S1, S2 normal. No  rubs, or gallops. 2/6 systolic murmur is present ABDOMEN: Soft, nontender, nondistended. Bowel sounds present. No organomegaly or mass.  No prolapse on my exam- reduced last night. Dry skin noted. EXTREMITIES: No  cyanosis, or clubbing. 1+ pedal edema noted NEUROLOGIC: Cranial nerves II through XII are intact. Muscle strength 5/5 in both upper extremities, 3/5 in lower extremities. Sensation intact. Gait not checked.  PSYCHIATRIC: The patient is alert and oriented x 3,  poor judgment. SKIN: No obvious rash, lesion, or ulcer.    LABORATORY PANEL:   CBC  Recent Labs Lab 03/28/17 0430  WBC 7.9  HGB 8.8*  HCT 26.8*  PLT 224   ------------------------------------------------------------------------------------------------------------------  Chemistries   Recent Labs Lab 03/29/17 0440  NA 141  K 3.5  CL 114*  CO2 23  GLUCOSE 92  BUN 13  CREATININE 0.74  CALCIUM 8.4*   ------------------------------------------------------------------------------------------------------------------  Cardiac Enzymes  Recent Labs Lab 03/28/17 2029  TROPONINI <0.03   ------------------------------------------------------------------------------------------------------------------  RADIOLOGY:  No results found.  EKG:   Orders placed or performed during the hospital encounter of 03/22/17  . ED EKG  . ED EKG  . EKG 12-Lead  . EKG 12-Lead    ASSESSMENT AND PLAN:   61 year old female with multiple medical problems including chronic systolic CHF with last EF of 45-50%, anemia of chronic disease, recent history of C. difficile colitis, history of DVT status post IVC, fibromyalgia, right hip avascular necrosis, right wrist drop, non-ischemic cardiomyopathy, malnutrition  presents to the hospital secondary to fall and syncope.  #1 Refractory orthostatic hypotension. -on midodrine, also on Florinef. Standing BP still drops with elevated HR last night, none today -No improvement with  fluid bolus either- received epogen but with new  onset chest pain- so, being discontinued. started pyridostigmine as they have helped in some studies - will need endocrinology f/u and ACTH stimulation tests as outpt- cant do it now that she is on florinef and cortisone - cardiology consult and neurology consult today for any other suggestions- ? autonomic dysfunction - chronically on morphine- refuses to DC that Agreed for TEDs today after repeated discussion -Physical therapy and OT consult recommended rehabilitation at discharge.  #2 right wrist drop-MRI of C-spine did not show any acute cord compression. Appreciate neurology consult. -Currently in a splint. Outpatient NCS/EMG study is recommended.  #3 right hip avascular necrosis-appreciate orthopedic consult. Weightbearing as tolerated recommended. -Non-urgent surgery as outpatient.  #4 history of paroxysmal atrial fibrillation-rate is controlled. Continue flecainide. Not a candidate for anticoagulation.  Aspirin held due to potential GI bleed.  #5 peripheral neuropathy has been on Lyrica- decrease dose due to hypotension. Has muscle spasms. Zanaflex is restarted at a lower dose.  #6 congestive heart failure-chronic systolic dysfunction. Not in any acute exacerbation. If needed IV fluids will be given.  monitor carefully.  #7 history of breast cancer-continue anastrozole  #8.Rectal prolapse vs internal hemorrhoids, none on exam - appreciate surgical consult, no acute indication for surgery, f/u at [facility] - anusol suppositories for now  #9 DVT prophylaxis-on lovenox  #10 Depression- appreciate psych consult, increased remeron dose.  palliative care has been consulted to address goals of treatment   All the records are reviewed and case discussed with Care Management/Social Workerr. Management plans discussed with the patient, family and they are in agreement.  CODE STATUS: Full code  TOTAL  TIME TAKING CARE OF THIS PATIENT: 42  minutes.   POSSIBLE D/C in 2 days, DEPENDING ON CLINICAL CONDITION.   Kasaundra Fahrney M.D on 03/30/2017 at 1:25 PM  Between 7am to 6pm - Pager - [phone]  After 6pm go to [email or web address] - password [email or web address] [place]  Office  [phone]  CC: Primary care physician; System, Pcp Not In

## 2017-03-30 NOTE — Progress Notes (Signed)
PT Cancellation Note  Patient Details Name: Lindsay [name] MRN: [id number] DOB: 04-14-1956   Cancelled Treatment:    Reason Eval/Treat Not Completed: Other (comment). Treatment attempted; pt with other medical staff. Re attempt at a later time/date as the schedule allows.    Larae Grooms, PTA 03/30/2017, 3:19 PM

## 2017-03-30 NOTE — Plan of Care (Signed)
Problem: Safety: Goal: Ability to remain free from injury will improve Outcome: Progressing Re-enforced education on calling for assistance when needing to get out of bed

## 2017-03-31 DIAGNOSIS — D649 Anemia, unspecified: Secondary | ICD-10-CM

## 2017-03-31 DIAGNOSIS — E46 Unspecified protein-calorie malnutrition: Secondary | ICD-10-CM

## 2017-03-31 LAB — CBC
HCT: 27.4 % — ABNORMAL LOW (ref 35.0–47.0)
HEMOGLOBIN: 9.3 g/dL — AB (ref 12.0–16.0)
MCH: 32.6 pg (ref 26.0–34.0)
MCHC: 33.9 g/dL (ref 32.0–36.0)
MCV: 96.1 fL (ref 80.0–100.0)
PLATELETS: 366 10*3/uL (ref 150–440)
RBC: 2.85 MIL/uL — AB (ref 3.80–5.20)
RDW: 16 % — ABNORMAL HIGH (ref 11.5–14.5)
WBC: 10 10*3/uL (ref 3.6–11.0)

## 2017-03-31 NOTE — Progress Notes (Signed)
Progress Note  Patient Name: Lindsay [name] Date of Encounter: 03/31/2017  Primary Cardiologist: New to [facility], followed at [facility]   Denies any significant diarrhea overnight Head nausea last night with heartburn, "gas", unable to drink much soup or eat significant dinner. Poor intake over the past several days. Started on IV fluids last night, 75 c/hr She's willing to try getting out of bed today and sitting in a recliner Yesterday had difficulty secondary to hypotension when standing  Inpatient Medications    Scheduled Meds: . anastrozole  1 mg Oral Daily  . enoxaparin (LOVENOX) injection  40 mg Subcutaneous Q24H  . feeding supplement  1 Container Oral TID BM  . flecainide  50 mg Oral Q12H  . fludrocortisone  0.2 mg Oral BID  . fluticasone  1 spray Each Nare Daily  . loperamide  2 mg Oral BID  . midodrine  10 mg Oral TID WC  . mirtazapine  30 mg Oral QHS  . morphine  15 mg Oral Q8H  . multivitamin with minerals  1 tablet Oral Daily  . pantoprazole  40 mg Oral BID AC  . potassium chloride  20 mEq Oral BID  . pregabalin  150 mg Oral BID  . pyridostigmine  30 mg Oral TID  . sertraline  150 mg Oral QHS   Continuous Infusions: . dextrose 5 % and 0.45% NaCl 75 mL/hr at 03/31/17 0719   PRN Meds: acetaminophen **OR** acetaminophen, alum & mag hydroxide-simeth, clonazePAM, hydrocortisone, loratadine, ondansetron (ZOFRAN) IV, ondansetron **OR** [DISCONTINUED] ondansetron (ZOFRAN) IV, oxyCODONE, tiZANidine, traZODone   Vital Signs    Vitals:   03/30/17 1511 03/30/17 2009 03/31/17 0414 03/31/17 0500  BP: (!) 150/90 140/87 133/81   Pulse:  69 69   Resp:  18 17   Temp:  98.2 F (36.8 C) 98.3 F (36.8 C)   TempSrc:  Oral Oral   SpO2:  100% 98%   Weight:    113 lb 9.6 oz (51.5 kg)  Height:        Intake/Output Summary (Last 24 hours) at 03/31/17 1113 Last data filed at 03/31/17 0719  Gross per 24 hour  Intake             1160 ml  Output                0 ml    Net             1160 ml   Filed Weights   03/29/17 0508 03/30/17 0500 03/31/17 0500  Weight: 118 lb 11.2 oz (53.8 kg) 113 lb 1.6 oz (51.3 kg) 113 lb 9.6 oz (51.5 kg)    Telemetry      ECG      Physical Exam    GEN: Thin, NAD Neck: No JVD  Cardiac: RRR, no murmurs, rubs, or gallops.  Radials/DP/PT 2+ and equal bilaterally.  Respiratory:  Clear to auscultation bilaterally. GI: Soft, nontender, non-distended  MS: no deformity; no edema Neuro:  Alert and oriented x 3  Labs    Chemistry Recent Labs Lab 03/25/17 0631 03/26/17 0536 03/29/17 0440  NA 147* 144 141  K 3.5 4.0 3.5  CL 118* 122* 114*  CO2 23 20* 23  GLUCOSE 74 95 92  BUN 21* 19 13  CREATININE 1.13* 1.01* 0.74  CALCIUM 8.5* 8.3* 8.4*  GFRNONAA 53* >60 >60  GFRAA >60 >60 >60  ANIONGAP 6 2* 4*     Hematology Recent Labs Lab 03/27/17 438 545 4205  03/27/17 1025 03/28/17 0430 03/31/17 0450  WBC 6.1  --  7.9 10.0  RBC 2.78* 2.84* 2.80* 2.85*  HGB 9.0* 8.9* 8.8* 9.3*  HCT 26.6*  --  26.8* 27.4*  MCV 95.6  --  95.6 96.1  MCH 32.3  --  31.5 32.6  MCHC 33.8  --  33.0 33.9  RDW 16.5*  --  16.7* 16.0*  PLT 209  --  224 366    Cardiac Enzymes Recent Labs Lab 03/28/17 2029  TROPONINI <0.03   No results for input(s): TROPIPOC in the last 168 hours.   BNPNo results for input(s): BNP, PROBNP in the last 168 hours.   DDimer No results for input(s): DDIMER in the last 168 hours.   Radiology    Ct Abdomen W Contrast  Result Date: 03/30/2017  IMPRESSION: 1. Normal adrenal glands with no adrenal nodules. 2. No acute abnormality. 3. Cholelithiasis, with no evidence of acute cholecystitis. Stable chronic common bile duct dilation without intrahepatic biliary ductal dilatation. Stable mild diffuse pancreatic duct dilation. No evidence of an obstructing mass. No radiopaque choledocholithiasis. Recommend correlation with serum bilirubin levels. MRI abdomen with MRCP without and with IV contrast may be performed as  clinically warranted. 4. Stable expected postsurgical changes from gastric bypass surgery. 5. Aortic atherosclerosis. Electronically Signed   By: Ilona Sorrel M.D.   On: 03/30/2017 16:29     Cardiac Studies   Echocardiogram March 2018 with normal LV function  Patient Profile     61 y.o. female with history of chronic orthostatic hypotension on florinef, felt secondary to diarrhea, chemotherapy, who presents to the hospital after fall, syncope, hip injury, possible AVN,  also with weakness right hand,  inability to extend her fingers   Assessment & Plan    1)Orthostatic hypotension combination of factors: Chronic diarrhea,  Nausea/decreased PO intake, Anemia, Previous chemotherapy, known orthostasis Pain medication possibly contributing to her symptoms? -On Imodium, nausea medication, IV fluids, H2 blocker -On several medications for blood pressure support including midodrine, Florinef Would encourage her to sit in a chair even recliner as much as her hip will tolerate --For weight loss, she will likely need assistance with eating as she is right-handed, unable to feed herself. Consider nutritional shakes  2) chronic diarrhea Dating back many years Previously had C. Difficile. Consider repeat check if clinically indicated  3) anemia Possibly secondary to anemia of chronic disease, poor food intake albumin 2.1 Likely contributing to her orthostasis Consider guaiac stools Iron studies appear stable Encourage nutrition/weight gain, shakes  4) paroxysmal atrial fibrillation We will need to watch her heart rates, bradycardia on EKG If bradycardia persists we may need to decrease her flecainide    Total encounter time more than 25 minutes  Greater than 50% was spent in counseling and coordination of care with the patient  Signed, Ida Rogue, MD  03/31/2017, 11:13 AM

## 2017-03-31 NOTE — Progress Notes (Addendum)
Occupational Therapy Treatment Patient Details Name: Lindsay [name] MRN: [id number] DOB: February 10, 1956 Today's Date: 03/31/2017    History of present illness 61 y.o. female with a painful R hip after passing out in wc at home was cleared for mobility by orthopedist, no fracture.  Transfused, now no longer orthostatic.  R wrist drop, splinted.  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 performed AROM for shoulder flexion, abduction, elbow flexion, and extension. PROM/AAROM for wrist extension, MP extension, thumb radial, and palmar abduction. Pt. could benefit from a radial nerve splint for the right hand. Pt. currently has a wrist cock-up brace in place. Pt. education was provided about brace checks, application, and removal.   Follow Up Recommendations  SNF    Equipment Recommendations       Recommendations for Other Services      Precautions / Restrictions Restrictions Weight Bearing Restrictions: No                                                     ADL either performed or assessed with clinical judgement   ADL Overall ADL's : Needs assistance/impaired                                             Vision       Perception     Praxis      Cognition Arousal/Alertness: Lethargic Behavior During Therapy: Flat affect Overall Cognitive Status: Difficult to assess                                           Shoulder Instructions       General Comments      Pertinent Vitals/ Pain          Home Living                                          Prior Functioning/Environment              Frequency  Min 1X/week        Progress Toward Goals  OT Goals(current goals can now be found in the care plan section)        Plan Discharge plan remains  appropriate    Co-evaluation                 AM-PAC PT "6 Clicks" Daily Activity     Outcome Measure   Help from another person eating meals?: A Lot Help from another person taking care of personal grooming?: A Little Help from another person toileting, which includes using toliet, bedpan, or urinal?: Total Help from another person bathing (including washing, rinsing, drying)?: A Lot Help from another person to put on and taking off regular upper body clothing?: A Little Help from another person to put on and taking off regular lower body clothing?: A Lot 6 Click  Score: 13    End of Session    OT Visit Diagnosis: Muscle weakness (generalized) (M62.81)   Activity Tolerance Patient limited by fatigue   Patient Left in bed;with call bell/phone within reach;with bed alarm set   Nurse Communication          Time: 5189-8421 OT Time Calculation (min): 15 min  Charges: OT General Charges $OT Visit: 1 Procedure OT Treatments $Self Care/Home Management : 8-22 mins  Harrel Carina, MS, OTR/L  Harrel Carina, MS, OTR/L 03/31/2017, 2:50 PM

## 2017-03-31 NOTE — Progress Notes (Signed)
PT Cancellation Note  Patient Details Name: Lindsay [name] MRN: [id number] DOB: [birth year]   Cancelled Treatment:    Reason Eval/Treat Not Completed: Other (comment).  Pt currently working with OT.  Of note, pt with positive orthostatics on 03/30/17.  Will continue to follow acutely.   Collie Siad PT, DPT 03/31/2017, 1:50 PM

## 2017-03-31 NOTE — Progress Notes (Signed)
PT Cancellation Note  Patient Details Name: Lindsay [name] MRN: [id number] DOB: Dec 29, 1955   Cancelled Treatment:    Reason Eval/Treat Not Completed: Medical issues which prohibited therapy.  Pt reports experiencing heartburn that is 8/10 and is tearful about this pain and about not having family that are near to visit her.  She was still agreeable to therapy; however, BP taken in sitting reading 141/100. Taken again after several minutes resting, reading 165/94.  RN notified. Will continue to follow pt acutely.   Collie Siad PT, DPT 03/31/2017, 3:11 PM

## 2017-03-31 NOTE — Care Management Important Message (Signed)
Important Message  Patient Details  Name: Lindsay [name] MRN: [id number] Date of Birth: 1956-06-11   Medicare Important Message Given:  Yes    Beverly Sessions, RN 03/31/2017, 3:54 PM

## 2017-03-31 NOTE — Progress Notes (Signed)
[facility] at [place] NAME: Lindsay [name]    MR#:  [id number]  DATE OF BIRTH:  Mar 05, 1956  SUBJECTIVE:  CHIEF COMPLAINT:   Chief Complaint  Patient presents with  . Fall   -feels some better today- sitting and lying BP stable now- but standing BP drops significantly- refused meds yesterday due to nausea- monitor today  REVIEW OF SYSTEMS:  Review of Systems  Constitutional: Negative for chills, fever and malaise/fatigue.  HENT: Negative for congestion, ear discharge, hearing loss and nosebleeds.   Eyes: Negative for blurred vision.  Respiratory: Negative for cough, shortness of breath and wheezing.   Cardiovascular: Positive for leg swelling. Negative for chest pain and palpitations.  Gastrointestinal: Positive for abdominal pain and nausea. Negative for constipation, diarrhea and vomiting.  Genitourinary: Negative for dysuria.  Musculoskeletal: Positive for joint pain and myalgias.  Neurological: Negative for dizziness, sensory change, speech change, focal weakness, seizures and headaches.    DRUG ALLERGIES:   Allergies  Allergen Reactions  . Shrimp [Shellfish Allergy] Anaphylaxis    VITALS:  Blood pressure 135/86, pulse 71, temperature 98.7 F (37.1 C), temperature source Oral, resp. rate 18, height 5\' 6"  (1.676 m), weight 51.5 kg (113 lb 9.6 oz), SpO2 100 %.  PHYSICAL EXAMINATION:  Physical Exam  GENERAL:  61 y.o.-year-old Chronically ill appearing patient lying in the bed with no acute distress.  EYES: Pupils equal, round, reactive to light and accommodation. No scleral icterus. Extraocular muscles intact.  HEENT: Head atraumatic, normocephalic. Oropharynx and nasopharynx clear.  NECK:  Supple, no jugular venous distention. No thyroid enlargement, no tenderness.  LUNGS: Normal breath sounds bilaterally, no wheezing, rales,rhonchi or crepitation. No use of accessory muscles of respiration.  CARDIOVASCULAR: S1, S2 normal. No  rubs, or gallops. 2/6 systolic murmur is present ABDOMEN: Soft, nontender, nondistended. Bowel sounds present. No organomegaly or mass.  No prolapse on my exam- reduced last night. Dry skin noted. EXTREMITIES: No  cyanosis, or clubbing. 1+ pedal edema noted NEUROLOGIC: Cranial nerves II through XII are intact. Muscle strength 5/5 in both upper extremities, 3/5 in lower extremities. Sensation intact. Gait not checked.  PSYCHIATRIC: The patient is alert and oriented x 3,  poor judgment. SKIN: No obvious rash, lesion, or ulcer.    LABORATORY PANEL:   CBC  Recent Labs Lab 03/31/17 0450  WBC 10.0  HGB 9.3*  HCT 27.4*  PLT 366   ------------------------------------------------------------------------------------------------------------------  Chemistries   Recent Labs Lab 03/29/17 0440  NA 141  K 3.5  CL 114*  CO2 23  GLUCOSE 92  BUN 13  CREATININE 0.74  CALCIUM 8.4*   ------------------------------------------------------------------------------------------------------------------  Cardiac Enzymes  Recent Labs Lab 03/28/17 2029  TROPONINI <0.03   ------------------------------------------------------------------------------------------------------------------  RADIOLOGY:  Ct Abdomen W Contrast  Result Date: 03/30/2017 CLINICAL DATA:  Refractory orthostatic hypotension. Evaluate for adrenal mass. History of right breast cancer. EXAM: CT ABDOMEN WITH CONTRAST TECHNIQUE: Multidetector CT imaging of the abdomen was performed using the standard protocol following bolus administration of intravenous contrast. CONTRAST:  63mL ISOVUE-300 IOPAMIDOL (ISOVUE-300) INJECTION 61% COMPARISON:  02/03/2016 unenhanced CT abdomen/ pelvis. FINDINGS: Lower chest: No significant pulmonary nodules or acute consolidative airspace disease. Hepatobiliary: Normal liver size. Two subcentimeter hypodense inferior right liver lobe lesions are too small to characterize and not appreciably changed  since 05/23/2015, suggesting benign lesions. No new liver lesions. Cholelithiasis, with no gallbladder wall thickening or pericholecystic fluid. No intrahepatic biliary ductal dilatation. Dilated common bile duct (8 mm diameter, not  convincingly changed back to 05/23/2015 . No radiopaque choledocholithiasis. Pancreas: Mild diffuse pancreatic duct dilation, most prominent in the pancreatic head at 4 mm diameter, not appreciably changed back to 05/23/2015. No pancreatic mass. Spleen: Normal size. No mass. Adrenals/Urinary Tract: Normal adrenal glands, with no discrete adrenal nodules. No hydronephrosis. Subcentimeter hypodense renal cortical lesions in both kidneys are too small to characterize and stable back to 05/23/2015, compatible with benign renal cysts. No new renal lesions. Stomach/Bowel: Stable expected postsurgical changes from gastric bypass surgery with collapsed excluded distal stomach and intact appearing gastrojejunostomy. Mild diverticulosis in the visualized distal colon. Intact appearing enteroenterostomy in the right abdomen. No unexpected bowel dilatation. No bowel wall thickening. Vascular/Lymphatic: Atherosclerotic nonaneurysmal abdominal aorta. Patent portal, splenic, hepatic and renal veins. IVC filter is in place below the level of the renal veins. No pathologically enlarged lymph nodes in the abdomen. Other: No pneumoperitoneum, ascites or focal fluid collection. Musculoskeletal: No aggressive appearing focal osseous lesions. Mild thoracolumbar spondylosis. IMPRESSION: 1. Normal adrenal glands with no adrenal nodules. 2. No acute abnormality. 3. Cholelithiasis, with no evidence of acute cholecystitis. Stable chronic common bile duct dilation without intrahepatic biliary ductal dilatation. Stable mild diffuse pancreatic duct dilation. No evidence of an obstructing mass. No radiopaque choledocholithiasis. Recommend correlation with serum bilirubin levels. MRI abdomen with MRCP without and with  IV contrast may be performed as clinically warranted. 4. Stable expected postsurgical changes from gastric bypass surgery. 5. Aortic atherosclerosis. Electronically Signed   By: Ilona Sorrel M.D.   On: 03/30/2017 16:29    EKG:   Orders placed or performed during the hospital encounter of 03/22/17  . ED EKG  . ED EKG  . EKG 12-Lead  . EKG 12-Lead    ASSESSMENT AND PLAN:   61 year old female with multiple medical problems including chronic systolic CHF with last EF of 45-50%, anemia of chronic disease, recent history of C. difficile colitis, history of DVT status post IVC, fibromyalgia, right hip avascular necrosis, right wrist drop, non-ischemic cardiomyopathy, malnutrition  presents to the hospital secondary to fall and syncope.  #1 Refractory orthostatic hypotension. -on midodrine, also on Florinef. Standing BP still drops - added mestinon- some research with improvement- - cardiology consult appreciated, IV fluids added. Encouraged to eat. Dietary consult - will need endocrinology f/u and ACTH stimulation tests as outpt- cant do it now that she is on florinef and cortisone. CT abd with normal adrenals - ? autonomic dysfunction Agreed for TEDs today after repeated discussion - advised to sit in recliner -Physical therapy and OT consult recommended rehabilitation at discharge.  #2 right wrist drop-MRI of C-spine did not show any acute cord compression. Appreciate neurology consult. -Currently in a splint. Outpatient NCS/EMG study is recommended.  #3 right hip avascular necrosis-appreciate orthopedic consult. Weightbearing as tolerated recommended. -Non-urgent surgery as outpatient.  #4 history of paroxysmal atrial fibrillation-rate is controlled. Continue flecainide. Not a candidate for anticoagulation.  Aspirin held due to potential GI bleed.  #5 peripheral neuropathy has been on Lyrica- decrease dose due to hypotension. Has muscle spasms.  #6 congestive heart failure-chronic  systolic dysfunction. Not in any acute exacerbation. On IV fluids.  monitor carefully.  #7 history of breast cancer-continue anastrozole  #8.Rectal prolapse vs internal hemorrhoids, none on exam - appreciate surgical consult, no acute indication for surgery, f/u at [facility] - anusol suppositories for now  #9 DVT prophylaxis-on lovenox  #10 Depression- appreciate psych consult, increased remeron dose.  palliative care has been consulted to address goals of treatment  All the records are reviewed and case discussed with Care Management/Social Workerr. Management plans discussed with the patient, family and they are in agreement.  CODE STATUS: Full code  TOTAL  TIME TAKING CARE OF THIS PATIENT: 33 minutes.   POSSIBLE D/C in 2 days, DEPENDING ON CLINICAL CONDITION.   Gladstone Lighter M.D on 03/31/2017 at 12:36 PM  Between 7am to 6pm - Pager - [phone]  After 6pm go to [email or web address] - password [email or web address] [facility]  Office  [phone]  CC: Primary care physician; System, Pcp Not In

## 2017-03-31 NOTE — Plan of Care (Signed)
Problem: Pain Managment: Goal: General experience of comfort will improve Outcome: Not Progressing Continues to c/o generalized "all over" diffuse pain, requiring prn meds.

## 2017-04-01 LAB — GLUCOSE, CAPILLARY
Glucose-Capillary: 85 mg/dL (ref 65–99)
Glucose-Capillary: 92 mg/dL (ref 65–99)

## 2017-04-01 MED ORDER — HYDROCORTISONE ACETATE 25 MG RE SUPP: 25.0000 mg | Freq: Two times a day (BID) | RECTAL | 0 refills | Status: DC | PRN

## 2017-04-01 MED ORDER — BOOST / RESOURCE BREEZE PO LIQD: 1.0000 | Freq: Three times a day (TID) | ORAL | 0 refills | Status: DC

## 2017-04-01 MED ORDER — PREGABALIN 150 MG PO CAPS: 150.0000 mg | ORAL_CAPSULE | Freq: Two times a day (BID) | ORAL | 0 refills | Status: AC

## 2017-04-01 MED ORDER — PYRIDOSTIGMINE BROMIDE 60 MG PO TABS: 30.0000 mg | ORAL_TABLET | Freq: Three times a day (TID) | ORAL | 0 refills | Status: DC

## 2017-04-01 MED ORDER — MIRTAZAPINE 30 MG PO TABS: 30.0000 mg | ORAL_TABLET | Freq: Every day | ORAL | 2 refills | Status: AC

## 2017-04-01 NOTE — Plan of Care (Signed)
Problem: Education: Goal: Knowledge of [facility] Education information/materials will improve Outcome: Not Progressing Patient needs assistance.  Problem: Health Behavior/Discharge Planning: Goal: Ability to manage health-related needs will improve Outcome: Not Progressing Patient needs assistance.  Problem: Activity: Goal: Risk for activity intolerance will decrease Outcome: Not Progressing Patient has weakness.  Problem: Fluid Volume: Goal: Ability to maintain a balanced intake and output will improve Outcome: Not Progressing Patient has a poor appetite.  Problem: Nutrition: Goal: Adequate nutrition will be maintained Outcome: Not Progressing Poor appetite.

## 2017-04-01 NOTE — Discharge Summary (Signed)
[place] [facility] [place] NAME: Lindsay [name]    MR#:  [id number]  DATE OF BIRTH:  [birth year]  DATE OF ADMISSION:  03/22/2017   ADMITTING PHYSICIAN: Epifanio Lesches, MD  DATE OF DISCHARGE: 04/01/2017  PRIMARY CARE PHYSICIAN: System, Pcp Not In   ADMISSION DIAGNOSIS:   Syncope, unspecified syncope type [R55] Anemia, unspecified type [D64.9]  DISCHARGE DIAGNOSIS:   Principal Problem:   Syncope Active Problems:   Hypotension   Paroxysmal A-fib (HCC)   Absolute anemia   NICM (nonischemic cardiomyopathy) (HCC)   Malnutrition (HCC)   S/P gastric bypass   Fibromyalgia   Anemia   Palliative care by specialist   Goals of care, counseling/discussion   Hemorrhoid prolapse   Severe recurrent major depression without psychotic features ([place])   Generalized pain   Failure to thrive in adult   AVN (avascular necrosis of bone) ([place])   SECONDARY DIAGNOSIS:   Past Medical History:  Diagnosis Date  . Anemia of chronic disease   . C. difficile diarrhea 10/2011  . Cancer ([place])   . Chronic cough   . Chronic systolic CHF (congestive heart failure) ([place])    a. echo 05/2015: EF 45-50%, nl wall motion, mod MR, nl PASP  . Depression   . Depression   . Fibromyalgia   . History of DVT (deep vein thrombosis)    a. s/p IVC filter 2014  . HX: breast cancer    a. right-side; b. s/p lumpectomy & chemo w/ Docetaxol, cytoxan, adriamycin  . Kidney stones 2013  . Malnutrition ([place])    a. chronic diarrhea s/p most meals   . Moderate mitral regurgitation   . Muscle spasm   . NICM (nonischemic cardiomyopathy) ([place])    a. Lexiscan 08/2015: no sig ischemia, no ECG changes concerning for ischemia (baseline abnormal T wave along anterolateral leads), EF 61%, low risk scan  . Orthostatic hypotension   . PAF (paroxysmal atrial fibrillation) ([place])    a. not on long term, full-dose anticoagulation since 2011, had high INR at that time 2/2 possible  malnutition, has been off anticoagulation since; b. CHADS2VASc at least 2 (CHF, female); c. 48 hr Holter 09/2015 NSR w/ rare PACs, no significant PVCs, periods of sinus tachycarida with average heart rate typically in the 60-100 range. No sig arrhythmia. No Afib seen.   . 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 Postural hypotension   . S/P gastric bypass   . Vitamin D deficiency     HOSPITAL COURSE:   61 year old female with multiple medical problems including chronic systolic CHF with last EF of 45-50%, anemia of chronic disease, recent history of C. difficile colitis, history of DVT status post IVC, fibromyalgia, right hip avascular necrosis, right wrist drop, non-ischemic cardiomyopathy, malnutrition  presents to the hospital secondary to fall and syncope.  #1 Refractory orthostatic hypotension. -on midodrine, also on Florinef. Standing BP still drops - added mestinon- some research with improvement- -Couldn't tolerate Epogen as it caused chest pain. - cardiology consult appreciated, IV fluids added. Encouraged to eat. Dietary consult - will need endocrinology f/u and ACTH stimulation tests as outpt- cant do it now that she is on florinef and cortisone. CT abd with normal adrenals - ? autonomic dysfunction - on TEDs today after repeated discussion - advised to sit in recliner -Physical therapy and OT consult recommended rehabilitation at discharge.  #2 right wrist drop-MRI of C-spine did not show any acute cord compression.  Appreciate neurology consult. -Currently in a splint. Outpatient NCS/EMG study is recommended.  #3 right hip avascular necrosis-appreciate orthopedic consult. Weightbearing as tolerated recommended. -Non-urgent surgery as outpatient. -Patient complaining of significant hip pain and unable to work with physical therapy due to that. Requesting transfer to do  #4 history of paroxysmal atrial fibrillation-rate is controlled.  -Flecainide held this  morning as bradycardic - Not a candidate for anticoagulation.  Aspirin held due to potential GI bleed.  #5 peripheral neuropathy has been on Lyrica- decrease dose due to hypotension. Has muscle spasms.  #6 congestive heart failure-chronic systolic dysfunction. Not in any acute exacerbation. On IV fluids.  monitor carefully.  #7 history of breast cancer-continue anastrozole  #8.Rectal prolapse vs internal hemorrhoids, none on exam - appreciate surgical consult, no acute indication for surgery, f/u at [facility] - anusol suppositories for now  #9 Depression- appreciate psych consult, increased remeron dose.  palliative care has been consulted to address goals of treatment Requested transfer to [place], transfer when bed available  DISCHARGE CONDITIONS:   Guarded  CONSULTS OBTAINED:   Treatment Team:  Thornton Park, MD Clapacs, Madie Reno, MD Minna Merritts, MD  DRUG ALLERGIES:   Allergies  Allergen Reactions  . Shrimp [Shellfish Allergy] Anaphylaxis   DISCHARGE MEDICATIONS:   Allergies as of 04/01/2017      Reactions   Shrimp [shellfish Allergy] Anaphylaxis      Medication List    STOP taking these medications   aspirin EC 81 MG tablet   spironolactone 25 MG tablet Commonly known as:  ALDACTONE   tiZANidine 4 MG tablet Commonly known as:  ZANAFLEX     TAKE these medications   anastrozole 1 MG tablet Commonly known as:  ARIMIDEX Take 1 mg by mouth daily.   clonazePAM 0.5 MG tablet Commonly known as:  KLONOPIN Take 0.5 mg by mouth at bedtime as needed for anxiety.   cyclobenzaprine 5 MG tablet Commonly known as:  FLEXERIL Take 1 tablet (5 mg total) by mouth 3 (three) times daily as needed for muscle spasms.   docusate sodium 100 MG capsule Commonly known as:  COLACE Take 1 capsule (100 mg total) by mouth 2 (two) times daily.   feeding supplement Liqd Take 1 Container by mouth 3 (three) times daily between meals.   flecainide 100 MG tablet Commonly  known as:  TAMBOCOR Take 0.5 tablets (50 mg total) by mouth every 12 (twelve) hours.   fludrocortisone 0.1 MG tablet Commonly known as:  FLORINEF Take 0.2 mg by mouth daily.   fluticasone 50 MCG/ACT nasal spray Commonly known as:  FLONASE Place 1 spray into the nose daily.   hydrocortisone 25 MG suppository Commonly known as:  ANUSOL-HC Place 1 suppository (25 mg total) rectally 2 (two) times daily as needed for hemorrhoids or itching.   lidocaine 5 % Commonly known as:  LIDODERM Place 1-3 patches onto the skin daily. Remove & Discard patch within 12 hours or as directed by MD   loperamide 2 MG capsule Commonly known as:  IMODIUM Take 2 mg by mouth 4 (four) times daily as needed for diarrhea or loose stools.   loratadine 10 MG tablet Commonly known as:  CLARITIN Take 10 mg by mouth daily as needed for allergies.   midodrine 10 MG tablet Commonly known as:  PROAMATINE Take 1 tablet (10 mg total) by mouth 3 (three) times daily with meals. What changed:  medication strength  how much to take  additional instructions   mirtazapine 30  MG tablet Commonly known as:  REMERON Take 1 tablet (30 mg total) by mouth at bedtime. What changed:  medication strength  how much to take   morphine 15 MG 12 hr tablet Commonly known as:  MS CONTIN Take 1 tablet (15 mg total) by mouth every 8 (eight) hours.   omeprazole 40 MG capsule Commonly known as:  PRILOSEC Take 40 mg by mouth daily.   oxyCODONE 15 MG immediate release tablet Commonly known as:  ROXICODONE Take 15 mg by mouth every 6 (six) hours as needed for pain.   potassium chloride 10 MEQ tablet Commonly known as:  K-DUR Take 20 mEq by mouth 2 (two) times daily.   pregabalin 150 MG capsule Commonly known as:  LYRICA Take 1 capsule (150 mg total) by mouth 2 (two) times daily. What changed:  when to take this   pyridostigmine 60 MG tablet Commonly known as:  MESTINON Take 0.5 tablets (30 mg total) by mouth 3  (three) times daily.   sertraline 100 MG tablet Commonly known as:  ZOLOFT Take 150 mg by mouth daily.   triamcinolone cream 0.1 % Commonly known as:  KENALOG Apply 1 application topically 2 (two) times daily.   vitamin B-12 1000 MCG tablet Commonly known as:  CYANOCOBALAMIN Take 1,000 mcg by mouth daily.   vitamin C 500 MG tablet Commonly known as:  ASCORBIC ACID Take 500 mg by mouth daily.   Vitamin D3 1000 units Caps Take 1,000 Units by mouth daily.        DISCHARGE INSTRUCTIONS:   PATIENT WILL BE TRANSFERRED TO [facility]  DIET:   Regular diet  ACTIVITY:   Activity as tolerated  OXYGEN:   Home Oxygen: No.  Oxygen Delivery: room air  DISCHARGE LOCATION:   [facility]   If you experience worsening of your admission symptoms, develop shortness of breath, life threatening emergency, suicidal or homicidal thoughts you must seek medical attention immediately by calling 911 or calling your MD immediately  if symptoms less severe.  You Must read complete instructions/literature along with all the possible adverse reactions/side effects for all the Medicines you take and that have been prescribed to you. Take any new Medicines after you have completely understood and accpet all the possible adverse reactions/side effects.   Please note  You were cared for by a hospitalist during your hospital stay. If you have any questions about your discharge medications or the care you received while you were in the hospital after you are discharged, you can call the unit and asked to speak with the hospitalist on call if the hospitalist that took care of you is not available. Once you are discharged, your primary care physician will handle any further medical issues. Please note that NO REFILLS for any discharge medications will be authorized once you are discharged, as it is imperative that you return to your primary care physician (or establish a relationship with a primary care  physician if you do not have one) for your aftercare needs so that they can reassess your need for medications and monitor your lab values.    On the day of Discharge:  VITAL SIGNS:   Blood pressure (!) 79/53, pulse 73, temperature 98.5 F (36.9 C), temperature source Oral, resp. rate 18, height 5\' 6"  (1.676 m), weight 52.6 kg (115 lb 14.4 oz), SpO2 100 %.  PHYSICAL EXAMINATION:    GENERAL:  61 y.o.-year-old Chronically ill appearing patient lying in the bed with no acute distress.  EYES:  Pupils equal, round, reactive to light and accommodation. No scleral icterus. Extraocular muscles intact.  HEENT: Head atraumatic, normocephalic. Oropharynx and nasopharynx clear.  NECK:  Supple, no jugular venous distention. No thyroid enlargement, no tenderness.  LUNGS: Normal breath sounds bilaterally, no wheezing, rales,rhonchi or crepitation. No use of accessory muscles of respiration.  CARDIOVASCULAR: S1, S2 normal. No rubs, or gallops. 2/6 systolic murmur is present ABDOMEN: Soft, nontender, nondistended. Bowel sounds present. No organomegaly or mass.  No prolapse on my exam- reduced last night. Dry skin noted. EXTREMITIES: No  cyanosis, or clubbing. 1+ pedal edema noted NEUROLOGIC: Cranial nerves II through XII are intact. Muscle strength 5/5 in both upper extremities, 3/5 in lower extremities. Sensation intact. Gait not checked.  PSYCHIATRIC: The patient is alert and oriented x 3, feels miserable. SKIN: No obvious rash, lesion, or ulcer.    DATA REVIEW:   CBC  Recent Labs Lab 03/31/17 0450  WBC 10.0  HGB 9.3*  HCT 27.4*  PLT 366    Chemistries   Recent Labs Lab 03/29/17 0440  NA 141  K 3.5  CL 114*  CO2 23  GLUCOSE 92  BUN 13  CREATININE 0.74  CALCIUM 8.4*     Microbiology Results  Results for orders placed or performed during the hospital encounter of 03/22/17  Urine culture     Status: Abnormal   Collection Time: 03/23/17  8:38 PM  Result Value Ref Range Status     Specimen Description URINE, RANDOM  Final   Special Requests NONE  Final   Culture MULTIPLE SPECIES PRESENT, SUGGEST RECOLLECTION (A)  Final   Report Status 03/25/2017 FINAL  Final    RADIOLOGY:  No results found.   Management plans discussed with the patient, family and they are in agreement.  CODE STATUS:     Code Status Orders        Start     Ordered   03/22/17 1611  Full code  Continuous     03/22/17 1612    Code Status History    Date Active Date Inactive Code Status Order ID Comments User Context   01/05/2017  4:48 PM 01/06/2017  9:37 PM Full Code [id number]  Demetrios Loll, MD Inpatient   05/29/2016  4:24 PM 05/30/2016  8:01 AM Full Code [id number]  Idelle Crouch, MD Inpatient   08/23/2015  4:14 PM 08/27/2015  7:54 PM Full Code [id number]  Fritzi Mandes, MD ED   05/23/2015 11:00 AM 05/26/2015  3:23 PM Full Code [id number]  Henreitta Leber, MD Inpatient      TOTAL TIME TAKING CARE OF THIS PATIENT: 38 minutes.    Gladstone Lighter M.D on 04/01/2017 at 12:03 PM  Between 7am to 6pm - Pager - [phone]  After 6pm go to [email or web address] - Proofreader  [facility]  Office  [phone]  CC: Primary care physician; System, Pcp Not In   Note: This dictation was prepared with Dragon dictation along with smaller phrase technology. Any transcriptional errors that result from this process are unintentional.

## 2017-04-01 NOTE — Progress Notes (Signed)
Lindsay [name] NAME: Lindsay [name]    MR#:  [id number]  DATE OF BIRTH:  03/11/1956  SUBJECTIVE:  CHIEF COMPLAINT:   Chief Complaint  Patient presents with  . Fall   -Still has orthostatic hypotension present. -Patient requesting transfer to [facility].  REVIEW OF SYSTEMS:  Review of Systems  Constitutional: Negative for chills, fever and malaise/fatigue.  HENT: Negative for congestion, ear discharge, hearing loss and nosebleeds.   Eyes: Negative for blurred vision.  Respiratory: Negative for cough, shortness of breath and wheezing.   Cardiovascular: Positive for leg swelling. Negative for chest pain and palpitations.  Gastrointestinal: Positive for abdominal pain and nausea. Negative for constipation, diarrhea and vomiting.  Genitourinary: Negative for dysuria.  Musculoskeletal: Positive for joint pain and myalgias.  Neurological: Negative for dizziness, sensory change, speech change, focal weakness, seizures and headaches.    DRUG ALLERGIES:   Allergies  Allergen Reactions  . Shrimp [Shellfish Allergy] Anaphylaxis    VITALS:  Blood pressure (!) 79/53, pulse 73, temperature 98.5 F (36.9 C), temperature source Oral, resp. rate 18, height 5\' 6"  (1.676 m), weight 52.6 kg (115 lb 14.4 oz), SpO2 100 %.  PHYSICAL EXAMINATION:  Physical Exam  GENERAL:  61 y.o.-year-old Chronically ill appearing patient lying in the bed with no acute distress.  EYES: Pupils equal, round, reactive to light and accommodation. No scleral icterus. Extraocular muscles intact.  HEENT: Head atraumatic, normocephalic. Oropharynx and nasopharynx clear.  NECK:  Supple, no jugular venous distention. No thyroid enlargement, no tenderness.  LUNGS: Normal breath sounds bilaterally, no wheezing, rales,rhonchi or crepitation. No use of accessory muscles of respiration.  CARDIOVASCULAR: S1, S2 normal. No rubs, or gallops. 2/6 systolic murmur is present ABDOMEN:  Soft, nontender, nondistended. Bowel sounds present. No organomegaly or mass.  No prolapse on my exam- reduced last night. Dry skin noted. EXTREMITIES: No  cyanosis, or clubbing. 1+ pedal edema noted NEUROLOGIC: Cranial nerves II through XII are intact. Muscle strength 5/5 in both upper extremities, 3/5 in lower extremities. Sensation intact. Gait not checked.  PSYCHIATRIC: The patient is alert and oriented x 3, feels miserable. SKIN: No obvious rash, lesion, or ulcer.    LABORATORY PANEL:   CBC  Recent Labs Lab 03/31/17 0450  WBC 10.0  HGB 9.3*  HCT 27.4*  PLT 366   ------------------------------------------------------------------------------------------------------------------  Chemistries   Recent Labs Lab 03/29/17 0440  NA 141  K 3.5  CL 114*  CO2 23  GLUCOSE 92  BUN 13  CREATININE 0.74  CALCIUM 8.4*   ------------------------------------------------------------------------------------------------------------------  Cardiac Enzymes  Recent Labs Lab 03/28/17 2029  TROPONINI <0.03   ------------------------------------------------------------------------------------------------------------------  RADIOLOGY:  Ct Abdomen W Contrast  Result Date: 03/30/2017 CLINICAL DATA:  Refractory orthostatic hypotension. Evaluate for adrenal mass. History of right breast cancer. EXAM: CT ABDOMEN WITH CONTRAST TECHNIQUE: Multidetector CT imaging of the abdomen was performed using the standard protocol following bolus administration of intravenous contrast. CONTRAST:  68mL ISOVUE-300 IOPAMIDOL (ISOVUE-300) INJECTION 61% COMPARISON:  02/03/2016 unenhanced CT abdomen/ pelvis. FINDINGS: Lower chest: No significant pulmonary nodules or acute consolidative airspace disease. Hepatobiliary: Normal liver size. Two subcentimeter hypodense inferior right liver lobe lesions are too small to characterize and not appreciably changed since 05/23/2015, suggesting benign lesions. No new liver  lesions. Cholelithiasis, with no gallbladder wall thickening or pericholecystic fluid. No intrahepatic biliary ductal dilatation. Dilated common bile duct (8 mm diameter, not convincingly changed back to 05/23/2015 . No radiopaque choledocholithiasis. Pancreas: Mild diffuse pancreatic  duct dilation, most prominent in the pancreatic head at 4 mm diameter, not appreciably changed back to 05/23/2015. No pancreatic mass. Spleen: Normal size. No mass. Adrenals/Urinary Tract: Normal adrenal glands, with no discrete adrenal nodules. No hydronephrosis. Subcentimeter hypodense renal cortical lesions in both kidneys are too small to characterize and stable back to 05/23/2015, compatible with benign renal cysts. No new renal lesions. Stomach/Bowel: Stable expected postsurgical changes from gastric bypass surgery with collapsed excluded distal stomach and intact appearing gastrojejunostomy. Mild diverticulosis in the visualized distal colon. Intact appearing enteroenterostomy in the right abdomen. No unexpected bowel dilatation. No bowel wall thickening. Vascular/Lymphatic: Atherosclerotic nonaneurysmal abdominal aorta. Patent portal, splenic, hepatic and renal veins. IVC filter is in place below the level of the renal veins. No pathologically enlarged lymph nodes in the abdomen. Other: No pneumoperitoneum, ascites or focal fluid collection. Musculoskeletal: No aggressive appearing focal osseous lesions. Mild thoracolumbar spondylosis. IMPRESSION: 1. Normal adrenal glands with no adrenal nodules. 2. No acute abnormality. 3. Cholelithiasis, with no evidence of acute cholecystitis. Stable chronic common bile duct dilation without intrahepatic biliary ductal dilatation. Stable mild diffuse pancreatic duct dilation. No evidence of an obstructing mass. No radiopaque choledocholithiasis. Recommend correlation with serum bilirubin levels. MRI abdomen with MRCP without and with IV contrast may be performed as clinically warranted. 4.  Stable expected postsurgical changes from gastric bypass surgery. 5. Aortic atherosclerosis. Electronically Signed   By: Ilona Sorrel M.D.   On: 03/30/2017 16:29    EKG:   Orders placed or performed during the hospital encounter of 03/22/17  . ED EKG  . ED EKG  . EKG 12-Lead  . EKG 12-Lead    ASSESSMENT AND PLAN:   61 year old female with multiple medical problems including chronic systolic CHF with last EF of 45-50%, anemia of chronic disease, recent history of C. difficile colitis, history of DVT status post IVC, fibromyalgia, right hip avascular necrosis, right wrist drop, non-ischemic cardiomyopathy, malnutrition  presents to the hospital secondary to fall and syncope.  #1 Refractory orthostatic hypotension. -on midodrine, also on Florinef. Standing BP still drops - added mestinon- some research with improvement- -Couldn't tolerate Epogen as it caused chest pain. - cardiology consult appreciated, IV fluids added. Encouraged to eat. Dietary consult - will need endocrinology f/u and ACTH stimulation tests as outpt- cant do it now that she is on florinef and cortisone. CT abd with normal adrenals - ? autonomic dysfunction - on TEDs today after repeated discussion - advised to sit in recliner -Physical therapy and OT consult recommended rehabilitation at discharge.  #2 right wrist drop-MRI of C-spine did not show any acute cord compression. Appreciate neurology consult. -Currently in a splint. Outpatient NCS/EMG study is recommended.  #3 right hip avascular necrosis-appreciate orthopedic consult. Weightbearing as tolerated recommended. -Non-urgent surgery as outpatient. -Patient complaining of significant hip pain and unable to work with physical therapy due to that. Requesting transfer to do  #4 history of paroxysmal atrial fibrillation-rate is controlled.  -Flecainide held this morning as bradycardic - Not a candidate for anticoagulation.  Aspirin held due to potential GI  bleed.  #5 peripheral neuropathy has been on Lyrica- decrease dose due to hypotension. Has muscle spasms.  #6 congestive heart failure-chronic systolic dysfunction. Not in any acute exacerbation. On IV fluids.  monitor carefully.  #7 history of breast cancer-continue anastrozole  #8.Rectal prolapse vs internal hemorrhoids, none on exam - appreciate surgical consult, no acute indication for surgery, f/u at [facility] - anusol suppositories for now  #9 DVT prophylaxis-on  lovenox  #10 Depression- appreciate psych consult, increased remeron dose.  palliative care has been consulted to address goals of treatment Requested transfer to [facility], waiting to hear back   All the records are reviewed and case discussed with Care Management/Social Workerr. Management plans discussed with the patient, family and they are in agreement.  CODE STATUS: Full code  TOTAL  TIME TAKING CARE OF THIS PATIENT: 33 minutes.   POSSIBLE D/C to [facility] when bed available, DEPENDING ON CLINICAL CONDITION.   Gladstone Lighter M.D on 04/01/2017 at 10:14 AM  Between 7am to 6pm - Pager - [phone]  After 6pm go to [email or web address] - password [email or web address] [place] [facility]  Office  [phone]  CC: Primary care physician; System, Pcp Not In

## 2017-04-02 LAB — CREATININE, SERUM: CREATININE: 0.87 mg/dL (ref 0.44–1.00)

## 2017-04-02 LAB — GLUCOSE, CAPILLARY: Glucose-Capillary: 82 mg/dL (ref 65–99)

## 2017-04-02 NOTE — Progress Notes (Signed)
[facility] [place] NAME: Lindsay [name]    MR#:  [id number]  DATE OF BIRTH:  1956-07-23  SUBJECTIVE:  CHIEF COMPLAINT:   Chief Complaint  Patient presents with  . Fall   -No changes from yesterday. Still orthostatically positive. Complains of nausea and indigestion  REVIEW OF SYSTEMS:  Review of Systems  Constitutional: Negative for chills, fever and malaise/fatigue.  HENT: Negative for congestion, ear discharge, hearing loss and nosebleeds.   Eyes: Negative for blurred vision.  Respiratory: Negative for cough, shortness of breath and wheezing.   Cardiovascular: Positive for leg swelling. Negative for chest pain and palpitations.  Gastrointestinal: Positive for abdominal pain, heartburn and nausea. Negative for constipation, diarrhea and vomiting.  Genitourinary: Negative for dysuria.  Musculoskeletal: Positive for joint pain and myalgias.  Neurological: Negative for dizziness, sensory change, speech change, focal weakness, seizures and headaches.    DRUG ALLERGIES:   Allergies  Allergen Reactions  . Shrimp [Shellfish Allergy] Anaphylaxis    VITALS:  Blood pressure 119/76, pulse (!) 53, temperature 98.3 F (36.8 C), temperature source Oral, resp. rate 18, height 5\' 6"  (1.676 m), weight 52.3 kg (115 lb 6.4 oz), SpO2 100 %.  PHYSICAL EXAMINATION:  Physical Exam  GENERAL:  61 y.o.-year-old Chronically ill appearing patient lying in the bed with no acute distress.  EYES: Pupils equal, round, reactive to light and accommodation. No scleral icterus. Extraocular muscles intact.  HEENT: Head atraumatic, normocephalic. Oropharynx and nasopharynx clear.  NECK:  Supple, no jugular venous distention. No thyroid enlargement, no tenderness.  LUNGS: Normal breath sounds bilaterally, no wheezing, rales,rhonchi or crepitation. No use of accessory muscles of respiration.  CARDIOVASCULAR: S1, S2 normal. No rubs, or gallops. 2/6 systolic murmur  is present ABDOMEN: Soft, nontender, nondistended. Bowel sounds present. No organomegaly or mass.  No prolapse on my exam- reduced last night. Dry skin noted. EXTREMITIES: No  cyanosis, or clubbing. 1+ pedal edema noted NEUROLOGIC: Cranial nerves II through XII are intact. Muscle strength 5/5 in both upper extremities, 3/5 in lower extremities. Sensation intact. Gait not checked.  PSYCHIATRIC: The patient is alert and oriented x 3, feels miserable. SKIN: No obvious rash, lesion, or ulcer.    LABORATORY PANEL:   CBC  Recent Labs Lab 03/31/17 0450  WBC 10.0  HGB 9.3*  HCT 27.4*  PLT 366   ------------------------------------------------------------------------------------------------------------------  Chemistries   Recent Labs Lab 03/29/17 0440 04/02/17 0510  NA 141  --   K 3.5  --   CL 114*  --   CO2 23  --   GLUCOSE 92  --   BUN 13  --   CREATININE 0.74 0.87  CALCIUM 8.4*  --    ------------------------------------------------------------------------------------------------------------------  Cardiac Enzymes  Recent Labs Lab 03/28/17 2029  TROPONINI <0.03   ------------------------------------------------------------------------------------------------------------------  RADIOLOGY:  No results found.  EKG:   Orders placed or performed during the hospital encounter of 03/22/17  . ED EKG  . ED EKG  . EKG 12-Lead  . EKG 12-Lead    ASSESSMENT AND PLAN:   61 year old female with multiple medical problems including chronic systolic CHF with last EF of 45-50%, anemia of chronic disease, recent history of C. difficile colitis, history of DVT status post IVC, fibromyalgia, right hip avascular necrosis, right wrist drop, non-ischemic cardiomyopathy, malnutrition  presents to the hospital secondary to fall and syncope.  #1 Refractory orthostatic hypotension. -on midodrine, also on Florinef. Standing BP still drops - added mestinon- some research with  improvement- -Couldn't tolerate Epogen as it caused chest pain. - cardiology consult appreciated, IV fluids added. Encouraged to eat. Dietary consult - will need endocrinology f/u and ACTH stimulation tests as outpt- cant do it now that she is on florinef and cortisone. CT abd with normal adrenals - ? autonomic dysfunction - on TEDs today after repeated discussion - advised to sit in recliner -Physical therapy and OT consult recommended rehabilitation at discharge.  #2 right wrist drop-MRI of C-spine did not show any acute cord compression. Appreciate neurology consult. -Currently in a splint. Outpatient NCS/EMG study is recommended.  #3 right hip avascular necrosis-appreciate orthopedic consult. Weightbearing as tolerated recommended. -Non-urgent surgery as outpatient. -Patient complaining of significant hip pain and unable to work with physical therapy due to that. Requesting transfer to do  #4 history of paroxysmal atrial fibrillation-rate is controlled.  -Flecainide held this morning as bradycardic - Not a candidate for anticoagulation.  Aspirin held due to potential GI bleed.  #5 peripheral neuropathy has been on Lyrica- decrease dose due to hypotension. Has muscle spasms.  #6 congestive heart failure-chronic systolic dysfunction. Not in any acute exacerbation. On IV fluids.  monitor carefully.  #7 history of breast cancer-continue anastrozole  #8.Rectal prolapse vs internal hemorrhoids, none on exam - appreciate surgical consult, no acute indication for surgery, f/u at [facility] - anusol suppositories for now  #9 DVT prophylaxis-on lovenox  #10 Depression- appreciate psych consult, increased remeron dose.  palliative care has been consulted to address goals of treatment Requested transfer to [facility], awaiting transfer   All the records are reviewed and case discussed with Care Management/Social Workerr. Management plans discussed with the patient, family and they are in  agreement.  CODE STATUS: Full code  TOTAL  TIME TAKING CARE OF THIS PATIENT: 32 minutes.   POSSIBLE D/C to [facility] when bed available, DEPENDING ON CLINICAL CONDITION.   Gladstone Lighter M.D on 04/02/2017 at 8:47 AM  Between 7am to 6pm - Pager - [phone]  After 6pm go to [email or web address] - password [email or web address] [facility]  Office  [phone]  CC: Primary care physician; System, Pcp Not In

## 2017-04-02 NOTE — Clinical Social Work Note (Signed)
CSW saw in chart review that the patient will most likely transfer to [facility]. The patient has continued to voice that she is not willing to go to a SNF for STR. CSW is signing off.  Santiago Bumpers, MSW, Latanya Presser [phone]

## 2017-05-31 ENCOUNTER — Other Ambulatory Visit: Payer: Self-pay

## 2017-05-31 ENCOUNTER — Inpatient Hospital Stay
Admission: EM | Admit: 2017-05-31 | Discharge: 2017-06-05 | DRG: 299 | Disposition: A | Discharge status: Skilled Nursing Facility | Payer: Medicare Other | Attending: Internal Medicine | Admitting: Internal Medicine

## 2017-05-31 DIAGNOSIS — R079 Chest pain, unspecified: Secondary | ICD-10-CM | POA: Diagnosis present

## 2017-05-31 DIAGNOSIS — Z87442 Personal history of urinary calculi: Secondary | ICD-10-CM

## 2017-05-31 DIAGNOSIS — I82412 Acute embolism and thrombosis of left femoral vein: Principal | ICD-10-CM | POA: Diagnosis present

## 2017-05-31 DIAGNOSIS — D649 Anemia, unspecified: Secondary | ICD-10-CM

## 2017-05-31 DIAGNOSIS — Y92003 Bedroom of unspecified non-institutional (private) residence as the place of occurrence of the external cause: Secondary | ICD-10-CM

## 2017-05-31 DIAGNOSIS — E43 Unspecified severe protein-calorie malnutrition: Secondary | ICD-10-CM | POA: Diagnosis present

## 2017-05-31 DIAGNOSIS — Z9221 Personal history of antineoplastic chemotherapy: Secondary | ICD-10-CM

## 2017-05-31 DIAGNOSIS — R109 Unspecified abdominal pain: Secondary | ICD-10-CM

## 2017-05-31 DIAGNOSIS — Z86718 Personal history of other venous thrombosis and embolism: Secondary | ICD-10-CM

## 2017-05-31 DIAGNOSIS — Z681 Body mass index (BMI) 19 or less, adult: Secondary | ICD-10-CM

## 2017-05-31 DIAGNOSIS — M25559 Pain in unspecified hip: Secondary | ICD-10-CM

## 2017-05-31 DIAGNOSIS — R296 Repeated falls: Secondary | ICD-10-CM | POA: Diagnosis present

## 2017-05-31 DIAGNOSIS — W1812XA Fall from or off toilet with subsequent striking against object, initial encounter: Secondary | ICD-10-CM | POA: Diagnosis present

## 2017-05-31 DIAGNOSIS — Z79899 Other long term (current) drug therapy: Secondary | ICD-10-CM

## 2017-05-31 DIAGNOSIS — Z9884 Bariatric surgery status: Secondary | ICD-10-CM

## 2017-05-31 DIAGNOSIS — I5022 Chronic systolic (congestive) heart failure: Secondary | ICD-10-CM | POA: Diagnosis present

## 2017-05-31 DIAGNOSIS — M7989 Other specified soft tissue disorders: Secondary | ICD-10-CM | POA: Diagnosis present

## 2017-05-31 DIAGNOSIS — G894 Chronic pain syndrome: Secondary | ICD-10-CM | POA: Diagnosis present

## 2017-05-31 DIAGNOSIS — M542 Cervicalgia: Secondary | ICD-10-CM | POA: Diagnosis present

## 2017-05-31 DIAGNOSIS — D638 Anemia in other chronic diseases classified elsewhere: Secondary | ICD-10-CM | POA: Diagnosis present

## 2017-05-31 DIAGNOSIS — Z95828 Presence of other vascular implants and grafts: Secondary | ICD-10-CM

## 2017-05-31 DIAGNOSIS — Z91013 Allergy to seafood: Secondary | ICD-10-CM

## 2017-05-31 DIAGNOSIS — L8990 Pressure ulcer of unspecified site, unspecified stage: Secondary | ICD-10-CM | POA: Insufficient documentation

## 2017-05-31 DIAGNOSIS — Z87891 Personal history of nicotine dependence: Secondary | ICD-10-CM

## 2017-05-31 DIAGNOSIS — N179 Acute kidney failure, unspecified: Secondary | ICD-10-CM | POA: Diagnosis present

## 2017-05-31 DIAGNOSIS — R197 Diarrhea, unspecified: Secondary | ICD-10-CM | POA: Diagnosis present

## 2017-05-31 DIAGNOSIS — F329 Major depressive disorder, single episode, unspecified: Secondary | ICD-10-CM | POA: Diagnosis present

## 2017-05-31 DIAGNOSIS — Z853 Personal history of malignant neoplasm of breast: Secondary | ICD-10-CM

## 2017-05-31 DIAGNOSIS — W19XXXA Unspecified fall, initial encounter: Secondary | ICD-10-CM

## 2017-05-31 NOTE — ED Triage Notes (Signed)
Per EMS, pt from home post fall from bedside commode to floor. Pt states she did hit head on bed. Pt denies LOC. Pt reports head, neck pain and "whole left side pain." Pt states hx of chronic pain and left leg pain. EMS also reports increased swelling to bilateral lower extremities. Pt states she has had an increase in falls over the last month and states increased weakness. Pt denies fever. EMS VS: 102cbg, 106/74bp. Pt alert at this time and able to answer questions.

## 2017-06-01 ENCOUNTER — Emergency Department: Payer: Medicare Other

## 2017-06-01 DIAGNOSIS — R600 Localized edema: Secondary | ICD-10-CM | POA: Diagnosis not present

## 2017-06-01 DIAGNOSIS — Y92003 Bedroom of unspecified non-institutional (private) residence as the place of occurrence of the external cause: Secondary | ICD-10-CM | POA: Diagnosis not present

## 2017-06-01 DIAGNOSIS — M7989 Other specified soft tissue disorders: Secondary | ICD-10-CM | POA: Diagnosis present

## 2017-06-01 DIAGNOSIS — N179 Acute kidney failure, unspecified: Secondary | ICD-10-CM | POA: Diagnosis present

## 2017-06-01 DIAGNOSIS — G894 Chronic pain syndrome: Secondary | ICD-10-CM | POA: Diagnosis present

## 2017-06-01 DIAGNOSIS — M25559 Pain in unspecified hip: Secondary | ICD-10-CM | POA: Diagnosis present

## 2017-06-01 DIAGNOSIS — I5032 Chronic diastolic (congestive) heart failure: Secondary | ICD-10-CM

## 2017-06-01 DIAGNOSIS — E43 Unspecified severe protein-calorie malnutrition: Secondary | ICD-10-CM | POA: Diagnosis present

## 2017-06-01 DIAGNOSIS — W1812XA Fall from or off toilet with subsequent striking against object, initial encounter: Secondary | ICD-10-CM | POA: Diagnosis present

## 2017-06-01 DIAGNOSIS — Z681 Body mass index (BMI) 19 or less, adult: Secondary | ICD-10-CM | POA: Diagnosis not present

## 2017-06-01 DIAGNOSIS — D649 Anemia, unspecified: Secondary | ICD-10-CM

## 2017-06-01 DIAGNOSIS — Z91013 Allergy to seafood: Secondary | ICD-10-CM | POA: Diagnosis not present

## 2017-06-01 DIAGNOSIS — Z853 Personal history of malignant neoplasm of breast: Secondary | ICD-10-CM | POA: Diagnosis not present

## 2017-06-01 DIAGNOSIS — Z87891 Personal history of nicotine dependence: Secondary | ICD-10-CM | POA: Diagnosis not present

## 2017-06-01 DIAGNOSIS — R296 Repeated falls: Secondary | ICD-10-CM | POA: Diagnosis present

## 2017-06-01 DIAGNOSIS — L8990 Pressure ulcer of unspecified site, unspecified stage: Secondary | ICD-10-CM | POA: Insufficient documentation

## 2017-06-01 DIAGNOSIS — Z86718 Personal history of other venous thrombosis and embolism: Secondary | ICD-10-CM | POA: Diagnosis not present

## 2017-06-01 DIAGNOSIS — D638 Anemia in other chronic diseases classified elsewhere: Secondary | ICD-10-CM | POA: Diagnosis present

## 2017-06-01 DIAGNOSIS — Z9221 Personal history of antineoplastic chemotherapy: Secondary | ICD-10-CM | POA: Diagnosis not present

## 2017-06-01 DIAGNOSIS — Z87442 Personal history of urinary calculi: Secondary | ICD-10-CM | POA: Diagnosis not present

## 2017-06-01 DIAGNOSIS — I5022 Chronic systolic (congestive) heart failure: Secondary | ICD-10-CM | POA: Diagnosis present

## 2017-06-01 DIAGNOSIS — R197 Diarrhea, unspecified: Secondary | ICD-10-CM | POA: Diagnosis present

## 2017-06-01 DIAGNOSIS — Z9884 Bariatric surgery status: Secondary | ICD-10-CM | POA: Diagnosis not present

## 2017-06-01 DIAGNOSIS — F329 Major depressive disorder, single episode, unspecified: Secondary | ICD-10-CM | POA: Diagnosis present

## 2017-06-01 DIAGNOSIS — I82402 Acute embolism and thrombosis of unspecified deep veins of left lower extremity: Secondary | ICD-10-CM | POA: Diagnosis not present

## 2017-06-01 DIAGNOSIS — I82412 Acute embolism and thrombosis of left femoral vein: Secondary | ICD-10-CM | POA: Diagnosis present

## 2017-06-01 DIAGNOSIS — R079 Chest pain, unspecified: Secondary | ICD-10-CM | POA: Diagnosis present

## 2017-06-01 DIAGNOSIS — M542 Cervicalgia: Secondary | ICD-10-CM | POA: Diagnosis present

## 2017-06-01 DIAGNOSIS — Z79899 Other long term (current) drug therapy: Secondary | ICD-10-CM | POA: Diagnosis not present

## 2017-06-01 LAB — COMPREHENSIVE METABOLIC PANEL
ALK PHOS: 235 U/L — AB (ref 38–126)
ALT: 27 U/L (ref 14–54)
AST: 45 U/L — AB (ref 15–41)
Albumin: 2.2 g/dL — ABNORMAL LOW (ref 3.5–5.0)
Anion gap: 6 (ref 5–15)
BUN: 21 mg/dL — AB (ref 6–20)
CALCIUM: 8.5 mg/dL — AB (ref 8.9–10.3)
CO2: 18 mmol/L — AB (ref 22–32)
Chloride: 117 mmol/L — ABNORMAL HIGH (ref 101–111)
Creatinine, Ser: 1.22 mg/dL — ABNORMAL HIGH (ref 0.44–1.00)
GFR, EST AFRICAN AMERICAN: 55 mL/min — AB (ref >60)
GFR, EST NON AFRICAN AMERICAN: 48 mL/min — AB (ref >60)
Glucose, Bld: 83 mg/dL (ref 65–99)
Potassium: 4.7 mmol/L (ref 3.5–5.1)
Sodium: 141 mmol/L (ref 135–145)
TOTAL PROTEIN: 4.9 g/dL — AB (ref 6.5–8.1)
Total Bilirubin: 1.6 mg/dL — ABNORMAL HIGH (ref 0.3–1.2)

## 2017-06-01 LAB — CBC WITH DIFFERENTIAL/PLATELET
Basophils Absolute: 0 10*3/uL (ref 0–0.1)
Basophils Relative: 0 %
Eosinophils Absolute: 0 10*3/uL (ref 0–0.7)
Eosinophils Relative: 0 %
HCT: 25.2 % — ABNORMAL LOW (ref 35.0–47.0)
HEMOGLOBIN: 7.8 g/dL — AB (ref 12.0–16.0)
LYMPHS ABS: 1.3 10*3/uL (ref 1.0–3.6)
LYMPHS PCT: 9 %
MCH: 30 pg (ref 26.0–34.0)
MCHC: 31.1 g/dL — ABNORMAL LOW (ref 32.0–36.0)
MCV: 96.4 fL (ref 80.0–100.0)
Monocytes Absolute: 0.5 10*3/uL (ref 0.2–0.9)
Monocytes Relative: 4 %
NEUTROS ABS: 12.4 10*3/uL — AB (ref 1.4–6.5)
NEUTROS PCT: 87 %
Platelets: 243 10*3/uL (ref 150–440)
RBC: 2.61 MIL/uL — AB (ref 3.80–5.20)
RDW: 20.3 % — ABNORMAL HIGH (ref 11.5–14.5)
WBC: 14.3 10*3/uL — ABNORMAL HIGH (ref 3.6–11.0)

## 2017-06-01 LAB — C DIFFICILE QUICK SCREEN W PCR REFLEX
C Diff antigen: POSITIVE — AB
C Diff toxin: NEGATIVE

## 2017-06-01 LAB — TSH: TSH: 1.455 u[IU]/mL (ref 0.350–4.500)

## 2017-06-01 LAB — LIPASE, BLOOD: Lipase: 15 U/L (ref 11–51)

## 2017-06-01 LAB — LACTIC ACID, PLASMA: Lactic Acid, Venous: 0.9 mmol/L (ref 0.5–1.9)

## 2017-06-01 LAB — CLOSTRIDIUM DIFFICILE BY PCR: Toxigenic C. Difficile by PCR: POSITIVE — AB

## 2017-06-01 LAB — TROPONIN I: Troponin I: 0.03 ng/mL (ref <0.03)

## 2017-06-01 MED ORDER — SERTRALINE HCL 50 MG PO TABS
150.0000 mg | ORAL_TABLET | Freq: Every day | ORAL | Status: DC
  Administered 2017-06-01 – 2017-06-05 (×5): 150 mg via ORAL
  Filled 2017-06-01 (×6): qty 3

## 2017-06-01 MED ORDER — MORPHINE SULFATE ER 15 MG PO TBCR
15.0000 mg | EXTENDED_RELEASE_TABLET | Freq: Three times a day (TID) | ORAL | Status: DC
  Administered 2017-06-01 – 2017-06-05 (×13): 15 mg via ORAL
  Filled 2017-06-01 (×14): qty 1

## 2017-06-01 MED ORDER — ONDANSETRON HCL 4 MG/2ML IJ SOLN: 4.0000 mg | Freq: Four times a day (QID) | INTRAMUSCULAR | Status: DC | PRN

## 2017-06-01 MED ORDER — TRIAMCINOLONE ACETONIDE 0.1 % EX CREA
1.0000 "application " | TOPICAL_CREAM | Freq: Two times a day (BID) | CUTANEOUS | Status: DC
  Administered 2017-06-01 – 2017-06-05 (×3): 1 via TOPICAL
  Filled 2017-06-01: qty 15

## 2017-06-01 MED ORDER — FLUDROCORTISONE ACETATE 0.1 MG PO TABS
0.2000 mg | ORAL_TABLET | Freq: Every day | ORAL | Status: DC
  Administered 2017-06-01 – 2017-06-05 (×5): 0.2 mg via ORAL
  Filled 2017-06-01 (×5): qty 2

## 2017-06-01 MED ORDER — MIDODRINE HCL 5 MG PO TABS: 5.0000 mg | ORAL_TABLET | Freq: Three times a day (TID) | ORAL | Status: DC

## 2017-06-01 MED ORDER — MIDODRINE HCL 5 MG PO TABS
10.0000 mg | ORAL_TABLET | Freq: Every day | ORAL | Status: DC
  Administered 2017-06-01 – 2017-06-05 (×5): 10 mg via ORAL
  Filled 2017-06-01 (×5): qty 2

## 2017-06-01 MED ORDER — POTASSIUM CHLORIDE CRYS ER 10 MEQ PO TBCR
20.0000 meq | EXTENDED_RELEASE_TABLET | Freq: Two times a day (BID) | ORAL | Status: DC
  Administered 2017-06-01 – 2017-06-05 (×9): 20 meq via ORAL
  Filled 2017-06-01 (×9): qty 2

## 2017-06-01 MED ORDER — LORATADINE 10 MG PO TABS: 10.0000 mg | ORAL_TABLET | Freq: Every day | ORAL | Status: DC | PRN

## 2017-06-01 MED ORDER — PANTOPRAZOLE SODIUM 40 MG PO TBEC
40.0000 mg | DELAYED_RELEASE_TABLET | Freq: Every day | ORAL | Status: DC
  Administered 2017-06-01 – 2017-06-05 (×5): 40 mg via ORAL
  Filled 2017-06-01 (×5): qty 1

## 2017-06-01 MED ORDER — ANASTROZOLE 1 MG PO TABS
1.0000 mg | ORAL_TABLET | Freq: Every day | ORAL | Status: DC
  Administered 2017-06-01 – 2017-06-05 (×5): 1 mg via ORAL
  Filled 2017-06-01 (×8): qty 1

## 2017-06-01 MED ORDER — DOCUSATE SODIUM 100 MG PO CAPS
100.0000 mg | ORAL_CAPSULE | Freq: Two times a day (BID) | ORAL | Status: DC
  Administered 2017-06-01 – 2017-06-05 (×7): 100 mg via ORAL
  Filled 2017-06-01 (×8): qty 1

## 2017-06-01 MED ORDER — ACETAMINOPHEN 650 MG RE SUPP: 650.0000 mg | Freq: Four times a day (QID) | RECTAL | Status: DC | PRN

## 2017-06-01 MED ORDER — FLUTICASONE PROPIONATE 50 MCG/ACT NA SUSP
1.0000 | Freq: Every day | NASAL | Status: DC
  Administered 2017-06-01 – 2017-06-05 (×5): 1 via NASAL
  Filled 2017-06-01: qty 16

## 2017-06-01 MED ORDER — APIXABAN 5 MG PO TABS: 5.0000 mg | ORAL_TABLET | Freq: Two times a day (BID) | ORAL | Status: DC

## 2017-06-01 MED ORDER — LOPERAMIDE HCL 2 MG PO CAPS: 2.0000 mg | ORAL_CAPSULE | Freq: Four times a day (QID) | ORAL | Status: DC | PRN

## 2017-06-01 MED ORDER — OXYCODONE HCL 5 MG PO TABS: 10.0000 mg | ORAL_TABLET | Freq: Four times a day (QID) | ORAL | Status: DC | PRN

## 2017-06-01 MED ORDER — APIXABAN 5 MG PO TABS
10.0000 mg | ORAL_TABLET | Freq: Two times a day (BID) | ORAL | Status: DC
  Administered 2017-06-01 – 2017-06-05 (×9): 10 mg via ORAL
  Filled 2017-06-01 (×9): qty 2

## 2017-06-01 MED ORDER — OXYCODONE HCL 5 MG PO TABS
15.0000 mg | ORAL_TABLET | Freq: Four times a day (QID) | ORAL | Status: DC | PRN
Start: 2017-06-01 — End: 2017-06-01
  Administered 2017-06-01: 15 mg via ORAL
  Filled 2017-06-01: qty 3

## 2017-06-01 MED ORDER — FLECAINIDE ACETATE 50 MG PO TABS
50.0000 mg | ORAL_TABLET | Freq: Two times a day (BID) | ORAL | Status: DC
  Administered 2017-06-01 – 2017-06-05 (×9): 50 mg via ORAL
  Filled 2017-06-01 (×10): qty 1

## 2017-06-01 MED ORDER — ACETAMINOPHEN 325 MG PO TABS: 325.0000 mg | ORAL_TABLET | Freq: Four times a day (QID) | ORAL | Status: DC | PRN

## 2017-06-01 MED ORDER — SODIUM CHLORIDE 0.9 % IV SOLN
INTRAVENOUS | Status: DC
  Administered 2017-06-02 – 2017-06-03 (×2): via INTRAVENOUS

## 2017-06-01 MED ORDER — LIDOCAINE 5 % EX PTCH
2.0000 | MEDICATED_PATCH | CUTANEOUS | Status: DC
  Administered 2017-06-01 – 2017-06-05 (×5): 2 via TRANSDERMAL
  Filled 2017-06-01 (×5): qty 2

## 2017-06-01 MED ORDER — VANCOMYCIN 50 MG/ML ORAL SOLUTION
125.0000 mg | Freq: Four times a day (QID) | ORAL | Status: DC
  Administered 2017-06-01 – 2017-06-05 (×16): 125 mg via ORAL
  Filled 2017-06-01 (×18): qty 2.5

## 2017-06-01 MED ORDER — ENSURE ENLIVE PO LIQD
237.0000 mL | Freq: Two times a day (BID) | ORAL | Status: DC
  Administered 2017-06-01 – 2017-06-02 (×2): 237 mL via ORAL

## 2017-06-01 MED ORDER — PREGABALIN 75 MG PO CAPS
150.0000 mg | ORAL_CAPSULE | Freq: Two times a day (BID) | ORAL | Status: DC
Start: 2017-06-01 — End: 2017-06-05
  Administered 2017-06-01 – 2017-06-05 (×9): 150 mg via ORAL
  Filled 2017-06-01 (×9): qty 2

## 2017-06-01 MED ORDER — VITAMIN D 1000 UNITS PO TABS
1000.0000 [IU] | ORAL_TABLET | Freq: Every day | ORAL | Status: DC
  Administered 2017-06-01 – 2017-06-05 (×5): 1000 [IU] via ORAL
  Filled 2017-06-01 (×5): qty 1

## 2017-06-01 MED ORDER — MIRTAZAPINE 15 MG PO TABS
30.0000 mg | ORAL_TABLET | Freq: Every day | ORAL | Status: DC
  Administered 2017-06-01 – 2017-06-04 (×4): 30 mg via ORAL
  Filled 2017-06-01 (×4): qty 2

## 2017-06-01 MED ORDER — OXYCODONE HCL 5 MG PO TABS
10.0000 mg | ORAL_TABLET | Freq: Once | ORAL | Status: AC
  Administered 2017-06-01: 10 mg via ORAL
  Filled 2017-06-01: qty 2

## 2017-06-01 MED ORDER — VITAMIN B-12 1000 MCG PO TABS
1000.0000 ug | ORAL_TABLET | Freq: Every day | ORAL | Status: DC
  Administered 2017-06-01 – 2017-06-05 (×5): 1000 ug via ORAL
  Filled 2017-06-01 (×5): qty 1

## 2017-06-01 MED ORDER — CLONAZEPAM 0.5 MG PO TABS
0.5000 mg | ORAL_TABLET | Freq: Every evening | ORAL | Status: DC | PRN
  Administered 2017-06-01 – 2017-06-02 (×2): 0.5 mg via ORAL
  Filled 2017-06-01 (×2): qty 1

## 2017-06-01 MED ORDER — ONDANSETRON HCL 4 MG PO TABS
4.0000 mg | ORAL_TABLET | Freq: Four times a day (QID) | ORAL | Status: DC | PRN
Start: 2017-06-01 — End: 2017-06-05

## 2017-06-01 MED ORDER — VITAMIN C 500 MG PO TABS
500.0000 mg | ORAL_TABLET | Freq: Every day | ORAL | Status: DC
  Administered 2017-06-01 – 2017-06-05 (×5): 500 mg via ORAL
  Filled 2017-06-01 (×5): qty 1

## 2017-06-01 MED ORDER — MIDODRINE HCL 5 MG PO TABS
5.0000 mg | ORAL_TABLET | Freq: Two times a day (BID) | ORAL | Status: DC
  Administered 2017-06-01 – 2017-06-05 (×9): 5 mg via ORAL
  Filled 2017-06-01 (×11): qty 1

## 2017-06-01 NOTE — Progress Notes (Signed)
Stool for C. difficile is positive. We'll start on vancomycin orally

## 2017-06-01 NOTE — Progress Notes (Signed)
Pt complaining of pain in her leg and not time for morphine or scheduled oxycodone. Per MD okay to place 1 time dose of oxycodone 10mg .

## 2017-06-01 NOTE — Progress Notes (Signed)
[facility] [place] NAME: Lindsay [name]    MR#:  [id number]  DATE OF BIRTH:  08/05/1956  SUBJECTIVE:  CHIEF COMPLAINT:   Chief Complaint  Patient presents with  . Fall  . Weakness   -Complaining of diarrhea. Has left lower extremity swelling and pain. Also has chronic pain.   REVIEW OF SYSTEMS:  Review of Systems  Constitutional: Positive for malaise/fatigue. Negative for chills and fever.  HENT: Negative for ear discharge, ear pain, hearing loss and nosebleeds.   Eyes: Negative for blurred vision and double vision.  Respiratory: Negative for cough, shortness of breath and wheezing.   Cardiovascular: Positive for leg swelling. Negative for chest pain and palpitations.  Gastrointestinal: Negative for abdominal pain, constipation, diarrhea, nausea and vomiting.  Genitourinary: Negative for dysuria.  Musculoskeletal: Positive for back pain, joint pain, myalgias and neck pain.  Neurological: Negative for dizziness, speech change, focal weakness, seizures and headaches.  Psychiatric/Behavioral: Negative for depression.    DRUG ALLERGIES:   Allergies  Allergen Reactions  . Shrimp [Shellfish Allergy] Anaphylaxis    VITALS:  Blood pressure 90/70, pulse 93, temperature 98.2 F (36.8 C), temperature source Oral, resp. rate (!) 22, height 5\' 6"  (1.676 m), weight 49.9 kg (110 lb), SpO2 98 %.  PHYSICAL EXAMINATION:  Physical Exam  GENERAL:  61 y.o.-year-old Thin built and well-nourished patient lying in the bed and complains of pain all over. EYES: Pupils equal, round, reactive to light and accommodation. No scleral icterus. Extraocular muscles intact.  HEENT: Head atraumatic, normocephalic. Oropharynx and nasopharynx clear.  NECK:  Supple, no jugular venous distention. No thyroid enlargement, no tenderness.  LUNGS: Normal breath sounds bilaterally, no wheezing, rales,rhonchi or crepitation. No use of accessory muscles of respiration.    CARDIOVASCULAR: S1, S2 normal. No murmurs, rubs, or gallops.  ABDOMEN: Soft, nontender, nondistended. Bowel sounds present. No organomegaly or mass.  EXTREMITIES: No  cyanosis, or clubbing. No edema of right leg, has 2+ edema of the left leg from foot to the thigh NEUROLOGIC: Cranial nerves II through XII are intact. Muscle strength 5/5 in all extremities. Sensation intact. Gait not checked. Overall weakness noted PSYCHIATRIC: The patient is alert and oriented x 3.  SKIN: No obvious rash, lesion, or ulcer.    LABORATORY PANEL:   CBC  Recent Labs Lab 05/31/17 2359  WBC 14.3*  HGB 7.8*  HCT 25.2*  PLT 243   ------------------------------------------------------------------------------------------------------------------  Chemistries   Recent Labs Lab 05/31/17 2359  NA 141  K 4.7  CL 117*  CO2 18*  GLUCOSE 83  BUN 21*  CREATININE 1.22*  CALCIUM 8.5*  AST 45*  ALT 27  ALKPHOS 235*  BILITOT 1.6*   ------------------------------------------------------------------------------------------------------------------  Cardiac Enzymes  Recent Labs Lab 05/31/17 2359  TROPONINI 0.03*   ------------------------------------------------------------------------------------------------------------------  RADIOLOGY:  Dg Chest 2 View  Result Date: 06/01/2017 CLINICAL DATA:  Left-sided pain after a fall. Multiple falls this week. EXAM: CHEST  2 VIEW COMPARISON:  03/22/2017 FINDINGS: Hyperinflation suggesting emphysema. Normal heart size and pulmonary vascularity. No focal airspace disease or consolidation in the lungs. No blunting of costophrenic angles. No pneumothorax. Mediastinal contours appear intact. Visualize ribs appear intact. Surgical clips in the right axilla and left upper quadrant. Degenerative changes in the spine. IMPRESSION: Pulmonary hyperinflation.  No evidence of active pulmonary disease. Electronically Signed   By: Lucienne Capers M.D.   On: 06/01/2017 01:13    Ct Head Wo Contrast  Result Date: 06/01/2017 CLINICAL DATA:  Fell from bedside commode to floor tonight. EXAM: CT HEAD WITHOUT CONTRAST CT CERVICAL SPINE WITHOUT CONTRAST TECHNIQUE: Multidetector CT imaging of the head and cervical spine was performed following the standard protocol without intravenous contrast. Multiplanar CT image reconstructions of the cervical spine were also generated. COMPARISON:  01/06/2017, 03/24/2017. FINDINGS: CT HEAD FINDINGS Brain: There is no intracranial hemorrhage, mass or evidence of acute infarction. There is moderate generalized atrophy. There is moderate chronic microvascular ischemic change. There is no significant extra-axial fluid collection. No acute intracranial findings are evident. Vascular: No hyperdense vessel or unexpected calcification. Skull: Normal. Negative for fracture or focal lesion. Sinuses/Orbits: No acute finding. Other: None. CT CERVICAL SPINE FINDINGS Alignment: Normal. Skull base and vertebrae: No acute fracture. No primary bone lesion or focal pathologic process. Soft tissues and spinal canal: No prevertebral fluid or swelling. No visible canal hematoma. Disc levels: Mild cervical degenerative disc changes at C6-7. Facet articulations are well-preserved and intact. Upper chest: Negative. Other: None IMPRESSION: 1. No acute intracranial findings. There is moderate generalized atrophy and chronic appearing white matter hypodensities which likely represent small vessel ischemic disease. 2. Negative for acute cervical spine fracture. Electronically Signed   By: Andreas Newport M.D.   On: 06/01/2017 01:31   Ct Cervical Spine Wo Contrast  Result Date: 06/01/2017 CLINICAL DATA:  [place] from bedside commode to floor tonight. EXAM: CT HEAD WITHOUT CONTRAST CT CERVICAL SPINE WITHOUT CONTRAST TECHNIQUE: Multidetector CT imaging of the head and cervical spine was performed following the standard protocol without intravenous contrast. Multiplanar CT image  reconstructions of the cervical spine were also generated. COMPARISON:  01/06/2017, 03/24/2017. FINDINGS: CT HEAD FINDINGS Brain: There is no intracranial hemorrhage, mass or evidence of acute infarction. There is moderate generalized atrophy. There is moderate chronic microvascular ischemic change. There is no significant extra-axial fluid collection. No acute intracranial findings are evident. Vascular: No hyperdense vessel or unexpected calcification. Skull: Normal. Negative for fracture or focal lesion. Sinuses/Orbits: No acute finding. Other: None. CT CERVICAL SPINE FINDINGS Alignment: Normal. Skull base and vertebrae: No acute fracture. No primary bone lesion or focal pathologic process. Soft tissues and spinal canal: No prevertebral fluid or swelling. No visible canal hematoma. Disc levels: Mild cervical degenerative disc changes at C6-7. Facet articulations are well-preserved and intact. Upper chest: Negative. Other: None IMPRESSION: 1. No acute intracranial findings. There is moderate generalized atrophy and chronic appearing white matter hypodensities which likely represent small vessel ischemic disease. 2. Negative for acute cervical spine fracture. Electronically Signed   By: Andreas Newport M.D.   On: 06/01/2017 01:31   US Venous Img Lower Unilateral Left  Result Date: 06/01/2017 CLINICAL DATA:  Left leg swelling. EXAM: Left LOWER EXTREMITY VENOUS DOPPLER ULTRASOUND TECHNIQUE: Gray-scale sonography with graded compression, as well as color Doppler and duplex ultrasound were performed to evaluate the lower extremity deep venous systems from the level of the common femoral vein and including the common femoral, femoral, profunda femoral, popliteal and calf veins including the posterior tibial, peroneal and gastrocnemius veins when visible. The superficial great saphenous vein was also interrogated. Spectral Doppler was utilized to evaluate flow at rest and with distal augmentation maneuvers in the  common femoral, femoral and popliteal veins. COMPARISON:  None. FINDINGS: Contralateral Common Femoral Vein: Respiratory phasicity is normal and symmetric with the symptomatic side. No evidence of thrombus. Normal compressibility. Common Femoral Vein: No evidence of thrombus. Normal compressibility, respiratory phasicity and response to augmentation. Saphenofemoral Junction: No evidence of thrombus. Normal compressibility and flow  on color Doppler imaging. Profunda Femoral Vein: Nonocclusive thrombus is demonstrated. Incomplete compression and flow signal. Femoral Vein: Occlusive thrombus demonstrated without compression or flow signal. Popliteal Vein: Nonocclusive thrombus is demonstrated. Incomplete compression and flow signal. Calf Veins: Occlusive thrombus is demonstrated. Non compression and no flow signal identified. Superficial Great Saphenous Vein: No evidence of thrombus. Normal compressibility and flow on color Doppler imaging. Venous Reflux:  None. Other Findings:  None. IMPRESSION: Examination is positive for deep venous thrombosis involving the left femoral, popliteal, calf, and profunda femoral veins. Critical results were called by telephone at the time of interpretation on 06/01/2017 at 3:06 am to Dr. Charlesetta Ivory , who verbally acknowledged these results. Electronically Signed   By: Lucienne Capers M.D.   On: 06/01/2017 03:08   Dg Hip Unilat With Pelvis 2-3 Views Left  Result Date: 06/01/2017 CLINICAL DATA:  Left-sided pain after a fall. EXAM: DG HIP (WITH OR WITHOUT PELVIS) 2-3V LEFT COMPARISON:  None. FINDINGS: Degenerative changes in the hips. Left hip demonstrates degenerative sclerosis and joint space narrowing in the superior acetabulum with small osteophytes on both joint surfaces. No evidence of acute fracture or dislocation. No focal bone lesion or bone destruction. Pelvis appears intact. SI joints and symphysis pubis are not displaced. IMPRESSION: Mild degenerative changes in the  left hip. No acute fracture or dislocation. Electronically Signed   By: Lucienne Capers M.D.   On: 06/01/2017 01:15   US Abdomen Limited Ruq  Result Date: 06/01/2017 CLINICAL DATA:  61 y/o  F; generalized abdominal pain for 1 day. EXAM: ULTRASOUND ABDOMEN LIMITED RIGHT UPPER QUADRANT COMPARISON:  03/30/2017 CT abdomen and pelvis. FINDINGS: Gallbladder: No gallbladder wall thickening or pericholecystic fluid. Numerous shadowing gallstones. Negative sonographic Murphy's sign. Common bile duct: Diameter: 8.7 mm Liver: Normal liver echogenicity. No focal liver lesion identified. Increased echogenicity of the right kidney and atrophy of compatible with medical renal disease. IMPRESSION: 1. Numerous gallstones.  No secondary signs of acute cholecystitis. 2. Echogenic and atrophic right kidney compatible with medical renal disease. 3. Stable mildly enlarged common bile duct up to 9 mm. Electronically Signed   By: Kristine Garbe M.D.   On: 06/01/2017 03:06    EKG:   Orders placed or performed during the hospital encounter of 05/31/17  . ED EKG  . ED EKG    ASSESSMENT AND PLAN:   61 year old female with multiple medical problems including chronic systolic CHF with last EF of 45-50%, anemia of chronic disease, recent history of C. difficile colitis, history of DVT status post IVC, fibromyalgia, right hip avascular necrosis, right wrist drop, non-ischemic cardiomyopathy, malnutrition presents to hospital secondary to weakness and left leg swelling.  #1 left lower extremity acute DVT-known history of DVT in the past status post IVC filter -Now with worsening swelling of left leg. Has been at hospitals for the last 2 months and decreased mobility being the risk factor. -Was Not on anticoagulation due to history of GI bleed in the past. -Now started on eliquis. Vascular consult for possible thrombectomy since patient is symptomatic. -Currently started on eliquis -Monitor hemoglobin  #2 C.  difficile colitis-presented with diarrhea, C. difficile antigen and toxin positive -We'll start oral vancomycin.  #3 acute on chronic anemia-Baseline hemoglobin around 8. -History of GI bleed requiring transfusions in the past. -Monitor closely while on eliquis. Transfuse only if hemoglobin less than 7 or if Symptomatic  #4 Acute renal insufficiency-secondary to prerenal and also ATN. -Gentle hydration given. Avoid nephrotoxins.  #5 chronic hypotension-continue  midodrine and florinef  #6 Chronic pain syndrome- continue pain meds   PT consulted Likely will need rehab   All the records are reviewed and case discussed with Care Management/Social Workerr. Management plans discussed with the patient, family and they are in agreement.  CODE STATUS: Full code  TOTAL TIME TAKING CARE OF THIS PATIENT: 38 minutes.   POSSIBLE D/C IN 2-3 DAYS, DEPENDING ON CLINICAL CONDITION.   Urania Pearlman M.D on 06/01/2017 at 1:18 PM  Between 7am to 6pm - Pager - [phone]  After 6pm go to [email or web address] - password [email or web address] [facility]  Office  [phone]  CC: Primary care physician; System, Pcp Not In

## 2017-06-01 NOTE — Progress Notes (Signed)
Per Dr. Tressia Miners okay to discontinue order for SCDs as pt has DVT.

## 2017-06-01 NOTE — ED Provider Notes (Signed)
[facility] Emergency Department Provider Note   ____________________________________________   First MD Initiated Contact with Patient 06/01/17 0002     (approximate)  I have reviewed the triage vital signs and the nursing notes.   HISTORY  Chief Complaint Fall and Weakness    HPI Lindsay [name] is a 61 y.o. female who comes into the hospital tonight with a fall. The patient has had increasing falls over the last month. Tonight she fell off her bedside commode and hit her head on the bed. She's been having some increased weakness as well over the last month. The patient states that she hit her left side and she has pain along her entire left side. The patient reports that she does not remember if she passed out. She has pain in her neck and chest but has no shortness of breath. She has some mild abdominal pain with some nausea and no vomiting. The patient states that she's been having chronic diarrhea and she's had some dizziness with no loss of consciousness. The patient came into the hospital today for evaluation. According to EMS the patient is also been having some swelling in her leg. She reports that every time she complains about it she gets a shot and no one has evaluated the swelling. She is here today for evaluation.   Past Medical History:  Diagnosis Date  . Anemia of chronic disease   . C. difficile diarrhea 10/2011  . Cancer ([place])   . Chronic cough   . Chronic systolic CHF (congestive heart failure) ([place])    a. echo 05/2015: EF 45-50%, nl wall motion, mod MR, nl PASP  . Depression   . Depression   . Fibromyalgia   . History of DVT (deep vein thrombosis)    a. s/p IVC filter 2014  . HX: breast cancer    a. right-side; b. s/p lumpectomy & chemo w/ Docetaxol, cytoxan, adriamycin  . Kidney stones 2013  . Malnutrition ([place])    a. chronic diarrhea s/p most meals   . Moderate mitral regurgitation   . Muscle spasm   . NICM (nonischemic  cardiomyopathy) ([place])    a. Lexiscan 08/2015: no sig ischemia, no ECG changes concerning for ischemia (baseline abnormal T wave along anterolateral leads), EF 61%, low risk scan  . Orthostatic hypotension   . PAF (paroxysmal atrial fibrillation) ([place])    a. not on long term, full-dose anticoagulation since 2011, had high INR at that time 2/2 possible malnutition, has been off anticoagulation since; b. CHADS2VASc at least 2 (CHF, female); c. 48 hr Holter 09/2015 NSR w/ rare PACs, no significant PVCs, periods of sinus tachycarida with average heart rate typically in the 60-100 range. No sig arrhythmia. No Afib seen.   . 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 Postural hypotension   . S/P gastric bypass   . Vitamin D deficiency     Patient Active Problem List   Diagnosis Date Noted  . AKI (acute kidney injury) ([place]) 06/01/2017  . Failure to thrive in adult 03/30/2017  . AVN (avascular necrosis of bone) ([place]) 03/30/2017  . Generalized pain   . Severe recurrent major depression without psychotic features ([place]) 03/27/2017  . Palliative care by specialist   . Goals of care, counseling/discussion   . Hemorrhoid prolapse   . Anemia 03/23/2017  . Right arm weakness 01/05/2017  . Chest pain 05/29/2016  . Elevated troponin 05/29/2016  . Atrial flutter ([place]) 05/29/2016  . Postural  hypotension   . Moderate mitral regurgitation   . PAF (paroxysmal atrial fibrillation) ([place])   . Malnutrition ([place])   . S/P gastric bypass   . Fibromyalgia   . Peripheral neuropathy   . NICM (nonischemic cardiomyopathy) ([place])   . Chronic systolic CHF (congestive heart failure) ([place])   . Recurrent major depressive disorder, in partial remission ([place])   . Major depression 08/26/2015  . Weakness of both legs   . Absolute anemia   . Pain in the chest   . Orthostatic hypotension   . Paroxysmal atrial fibrillation (HCC)   . Syncope and collapse   . Paroxysmal A-fib ([place])   . Atrial fibrillation ([place])  05/24/2015  . Abdominal pain 05/23/2015  . C. difficile colitis 12/12/2011  . Chest pain 11/11/2011  . Syncope 11/11/2011  . Hypotension 11/11/2011  . Cardiomyopathy 11/11/2011  . AF (atrial fibrillation) ([place]) 11/11/2011    Past Surgical History:  Procedure Laterality Date  . ABDOMINAL HYSTERECTOMY    . BREAST LUMPECTOMY     right @ [facility]  . ESOPHAGOGASTRODUODENOSCOPY (EGD) WITH PROPOFOL N/A 05/25/2015   Procedure: ESOPHAGOGASTRODUODENOSCOPY (EGD) WITH PROPOFOL;  Surgeon: Manya Silvas, MD;  Location[facility]COPY;  Service: Endoscopy;  Laterality: N/A;  . LITHOTRIPSY  jan 2013    Prior to Admission medications   Medication Sig Start Date End Date Taking? Authorizing Provider  anastrozole (ARIMIDEX) 1 MG tablet Take 1 mg by mouth daily.     Yes [provider]  Cholecalciferol (VITAMIN D3) 1000 units CAPS Take 1,000 Units by mouth daily.   Yes [provider]  clonazePAM (KLONOPIN) 0.5 MG tablet Take 0.5 mg by mouth at bedtime as needed for anxiety.    Yes [provider]  flecainide (TAMBOCOR) 100 MG tablet Take 0.5 tablets (50 mg total) by mouth every 12 (twelve) hours. 08/27/15  Yes Fritzi Mandes, MD  fludrocortisone (FLORINEF) 0.1 MG tablet Take 0.2 mg by mouth daily.   Yes [provider]  fluticasone (FLONASE) 50 MCG/ACT nasal spray Place 1 spray into the nose daily.    Yes [provider]  lidocaine (LIDODERM) 5 % Place 1-3 patches onto the skin daily. Remove & Discard patch within 12 hours or as directed by MD    Yes [provider]  loperamide (IMODIUM) 2 MG capsule Take 2 mg by mouth 4 (four) times daily as needed for diarrhea or loose stools.   Yes [provider]  loratadine (CLARITIN) 10 MG tablet Take 10 mg by mouth daily as needed for allergies.    Yes [provider]  midodrine (PROAMATINE) 10 MG tablet Take 1 tablet (10 mg total) by mouth 3 (three) times daily with meals. Patient taking  differently: Take 5-10 mg by mouth 3 (three) times daily with meals. Take 10 mg in the morning and 5 mg at noon and supper 03/25/17  Yes Gladstone Lighter, MD  mirtazapine (REMERON) 30 MG tablet Take 1 tablet (30 mg total) by mouth at bedtime. Patient taking differently: Take 15 mg by mouth at bedtime.  04/01/17  Yes Gladstone Lighter, MD  morphine (MS CONTIN) 15 MG 12 hr tablet Take 1 tablet (15 mg total) by mouth every 8 (eight) hours. 05/30/16  Yes Vaughan Basta, MD  omeprazole (PRILOSEC) 40 MG capsule Take 1 capsule by mouth daily. 07/07/16 07/07/17 Yes [provider]  oxyCODONE (ROXICODONE) 15 MG immediate release tablet Take 15 mg by mouth every 6 (six) hours as needed for pain.   Yes  [provider]  potassium chloride (K-DUR) 10 MEQ tablet Take 20 mEq by mouth 2 (two) times daily.   Yes [provider]  pregabalin (LYRICA) 150 MG capsule Take 1 capsule (150 mg total) by mouth 2 (two) times daily. 04/01/17  Yes Gladstone Lighter, MD  sertraline (ZOLOFT) 100 MG tablet Take 150 mg by mouth daily.    Yes [provider]  triamcinolone cream (KENALOG) 0.1 % Apply 1 application topically 2 (two) times daily.   Yes [provider]  vitamin B-12 (CYANOCOBALAMIN) 1000 MCG tablet Take 1,000 mcg by mouth daily.    Yes [provider]  vitamin C (ASCORBIC ACID) 500 MG tablet Take 500 mg by mouth daily.   Yes [provider]    Allergies Shrimp [shellfish allergy]  Family History  Problem Relation Age of Onset  . CAD Father        MI  . Diabetes Mellitus II Father   . Hypertension Father   . Diabetes Mellitus II Sister     Social History Social History  Substance Use Topics  . Smoking status: Former Smoker    Packs/day: 0.25    Years: 1.00    Types: Cigarettes    Quit date: 11/10/1985  . Smokeless tobacco: Never Used  . Alcohol use No    Review of Systems  Constitutional: No fever/chills Eyes: No visual  changes. ENT: No sore throat. Cardiovascular: chest pain. Respiratory: Denies shortness of breath. Gastrointestinal:  abdominal pain,  nausea, no vomiting.  No diarrhea.  No constipation. Genitourinary: Negative for dysuria. Musculoskeletal: Left Sided body pain Skin: Negative for rash. Neurological: Generalized weakness   ____________________________________________   PHYSICAL EXAM:  VITAL SIGNS: ED Triage Vitals  Enc Vitals Group     BP 05/31/17 2348 100/74     Pulse Rate 05/31/17 2348 88     Resp 05/31/17 2348 16     Temp 05/31/17 2348 98.8 F (37.1 C)     Temp Source 05/31/17 2348 Oral     SpO2 05/31/17 2348 99 %     Weight 05/31/17 2350 110 lb (49.9 kg)     Height 05/31/17 2350 5\' 6"  (1.676 m)     Head Circumference --      Peak Flow --      Pain Score 05/31/17 2348 8     Pain Loc --      Pain Edu? --      Excl. in [place]? --     Constitutional: Alert But somnolent and oriented. Patient is frail appearing and turned to the right and some moderate distress Eyes: Conjunctivae are normal. PERRL. EOMI. Head: Atraumatic. Nose: No congestion/rhinnorhea. Mouth/Throat: Mucous membranes are moist.  Oropharynx non-erythematous. Neck: cervical spine tenderness to palpation. Cardiovascular: Normal rate, regular rhythm. Grossly normal heart sounds.  Good peripheral circulation. Respiratory: Normal respiratory effort.  No retractions. Lungs CTAB. Gastrointestinal: Soft with some mild mid abdomen tenderness to palpation. No distention. Positive bowel sounds Musculoskeletal: Left lower extremity with some swelling and edema, patient also has some ulcers to her left lower extremity. Patient is cachectic appearing Neurologic:  Normal speech and language patient speaks very quietly. Cranial nerves II through XII are grossly intact. Patient has some generalized weakness but approximately a 3 out of 5 in strength diffusely. Skin:  Skin is warm, dry multiple ulcers noted to patient's legs  and arms. Patient has a bruise to her left hip. Psychiatric: Mood and affect are normal.   ____________________________________________   LABS (all  labs ordered are listed, but only abnormal results are displayed)  Labs Reviewed  CBC WITH DIFFERENTIAL/PLATELET - Abnormal; Notable for the following:       Result Value   WBC 14.3 (*)    RBC 2.61 (*)    Hemoglobin 7.8 (*)    HCT 25.2 (*)    MCHC 31.1 (*)    RDW 20.3 (*)    Neutro Abs 12.4 (*)    All other components within normal limits  COMPREHENSIVE METABOLIC PANEL - Abnormal; Notable for the following:    Chloride 117 (*)    CO2 18 (*)    BUN 21 (*)    Creatinine, Ser 1.22 (*)    Calcium 8.5 (*)    Total Protein 4.9 (*)    Albumin 2.2 (*)    AST 45 (*)    Alkaline Phosphatase 235 (*)    Total Bilirubin 1.6 (*)    GFR calc non Af Amer 48 (*)    GFR calc Af Amer 55 (*)    All other components within normal limits  TROPONIN I - Abnormal; Notable for the following:    Troponin I 0.03 (*)    All other components within normal limits  LACTIC ACID, PLASMA  LIPASE, BLOOD  URINALYSIS, COMPLETE (UACMP) WITH MICROSCOPIC   ____________________________________________  EKG  ED ECG REPORT I, Loney Hering, the attending physician, personally viewed and interpreted this ECG.   Date: 05/31/2017  EKG Time: 2313  Rate: 86  Rhythm: atrial fibrillation  Axis: normal  Intervals:none  ST&T Change: none  ____________________________________________  RADIOLOGY  Dg Chest 2 View  Result Date: 06/01/2017 CLINICAL DATA:  Left-sided pain after a fall. Multiple falls this week. EXAM: CHEST  2 VIEW COMPARISON:  03/22/2017 FINDINGS: Hyperinflation suggesting emphysema. Normal heart size and pulmonary vascularity. No focal airspace disease or consolidation in the lungs. No blunting of costophrenic angles. No pneumothorax. Mediastinal contours appear intact. Visualize ribs appear intact. Surgical clips in the right axilla and left  upper quadrant. Degenerative changes in the spine. IMPRESSION: Pulmonary hyperinflation.  No evidence of active pulmonary disease. Electronically Signed   By: Lucienne Capers M.D.   On: 06/01/2017 01:13   Ct Head Wo Contrast  Result Date: 06/01/2017 CLINICAL DATA:  [place] from bedside commode to floor tonight. EXAM: CT HEAD WITHOUT CONTRAST CT CERVICAL SPINE WITHOUT CONTRAST TECHNIQUE: Multidetector CT imaging of the head and cervical spine was performed following the standard protocol without intravenous contrast. Multiplanar CT image reconstructions of the cervical spine were also generated. COMPARISON:  01/06/2017, 03/24/2017. FINDINGS: CT HEAD FINDINGS Brain: There is no intracranial hemorrhage, mass or evidence of acute infarction. There is moderate generalized atrophy. There is moderate chronic microvascular ischemic change. There is no significant extra-axial fluid collection. No acute intracranial findings are evident. Vascular: No hyperdense vessel or unexpected calcification. Skull: Normal. Negative for fracture or focal lesion. Sinuses/Orbits: No acute finding. Other: None. CT CERVICAL SPINE FINDINGS Alignment: Normal. Skull base and vertebrae: No acute fracture. No primary bone lesion or focal pathologic process. Soft tissues and spinal canal: No prevertebral fluid or swelling. No visible canal hematoma. Disc levels: Mild cervical degenerative disc changes at C6-7. Facet articulations are well-preserved and intact. Upper chest: Negative. Other: None IMPRESSION: 1. No acute intracranial findings. There is moderate generalized atrophy and chronic appearing white matter hypodensities which likely represent small vessel ischemic disease. 2. Negative for acute cervical spine fracture. Electronically Signed   By: Andreas Newport M.D.   On:  06/01/2017 01:31   Ct Cervical Spine Wo Contrast  Result Date: 06/01/2017 CLINICAL DATA:  [place] from bedside commode to floor tonight. EXAM: CT HEAD WITHOUT CONTRAST  CT CERVICAL SPINE WITHOUT CONTRAST TECHNIQUE: Multidetector CT imaging of the head and cervical spine was performed following the standard protocol without intravenous contrast. Multiplanar CT image reconstructions of the cervical spine were also generated. COMPARISON:  01/06/2017, 03/24/2017. FINDINGS: CT HEAD FINDINGS Brain: There is no intracranial hemorrhage, mass or evidence of acute infarction. There is moderate generalized atrophy. There is moderate chronic microvascular ischemic change. There is no significant extra-axial fluid collection. No acute intracranial findings are evident. Vascular: No hyperdense vessel or unexpected calcification. Skull: Normal. Negative for fracture or focal lesion. Sinuses/Orbits: No acute finding. Other: None. CT CERVICAL SPINE FINDINGS Alignment: Normal. Skull base and vertebrae: No acute fracture. No primary bone lesion or focal pathologic process. Soft tissues and spinal canal: No prevertebral fluid or swelling. No visible canal hematoma. Disc levels: Mild cervical degenerative disc changes at C6-7. Facet articulations are well-preserved and intact. Upper chest: Negative. Other: None IMPRESSION: 1. No acute intracranial findings. There is moderate generalized atrophy and chronic appearing white matter hypodensities which likely represent small vessel ischemic disease. 2. Negative for acute cervical spine fracture. Electronically Signed   By: Andreas Newport M.D.   On: 06/01/2017 01:31   US Venous Img Lower Unilateral Left  Result Date: 06/01/2017 CLINICAL DATA:  Left leg swelling. EXAM: Left LOWER EXTREMITY VENOUS DOPPLER ULTRASOUND TECHNIQUE: Gray-scale sonography with graded compression, as well as color Doppler and duplex ultrasound were performed to evaluate the lower extremity deep venous systems from the level of the common femoral vein and including the common femoral, femoral, profunda femoral, popliteal and calf veins including the posterior tibial, peroneal  and gastrocnemius veins when visible. The superficial great saphenous vein was also interrogated. Spectral Doppler was utilized to evaluate flow at rest and with distal augmentation maneuvers in the common femoral, femoral and popliteal veins. COMPARISON:  None. FINDINGS: Contralateral Common Femoral Vein: Respiratory phasicity is normal and symmetric with the symptomatic side. No evidence of thrombus. Normal compressibility. Common Femoral Vein: No evidence of thrombus. Normal compressibility, respiratory phasicity and response to augmentation. Saphenofemoral Junction: No evidence of thrombus. Normal compressibility and flow on color Doppler imaging. Profunda Femoral Vein: Nonocclusive thrombus is demonstrated. Incomplete compression and flow signal. Femoral Vein: Occlusive thrombus demonstrated without compression or flow signal. Popliteal Vein: Nonocclusive thrombus is demonstrated. Incomplete compression and flow signal. Calf Veins: Occlusive thrombus is demonstrated. Non compression and no flow signal identified. Superficial Great Saphenous Vein: No evidence of thrombus. Normal compressibility and flow on color Doppler imaging. Venous Reflux:  None. Other Findings:  None. IMPRESSION: Examination is positive for deep venous thrombosis involving the left femoral, popliteal, calf, and profunda femoral veins. Critical results were called by telephone at the time of interpretation on 06/01/2017 at 3:06 am to Dr. Charlesetta Ivory , who verbally acknowledged these results. Electronically Signed   By: Lucienne Capers M.D.   On: 06/01/2017 03:08   Dg Hip Unilat With Pelvis 2-3 Views Left  Result Date: 06/01/2017 CLINICAL DATA:  Left-sided pain after a fall. EXAM: DG HIP (WITH OR WITHOUT PELVIS) 2-3V LEFT COMPARISON:  None. FINDINGS: Degenerative changes in the hips. Left hip demonstrates degenerative sclerosis and joint space narrowing in the superior acetabulum with small osteophytes on both joint surfaces. No  evidence of acute fracture or dislocation. No focal bone lesion or bone destruction. Pelvis appears intact. SI  joints and symphysis pubis are not displaced. IMPRESSION: Mild degenerative changes in the left hip. No acute fracture or dislocation. Electronically Signed   By: Lucienne Capers M.D.   On: 06/01/2017 01:15   US Abdomen Limited Ruq  Result Date: 06/01/2017 CLINICAL DATA:  61 y/o  F; generalized abdominal pain for 1 day. EXAM: ULTRASOUND ABDOMEN LIMITED RIGHT UPPER QUADRANT COMPARISON:  03/30/2017 CT abdomen and pelvis. FINDINGS: Gallbladder: No gallbladder wall thickening or pericholecystic fluid. Numerous shadowing gallstones. Negative sonographic Murphy's sign. Common bile duct: Diameter: 8.7 mm Liver: Normal liver echogenicity. No focal liver lesion identified. Increased echogenicity of the right kidney and atrophy of compatible with medical renal disease. IMPRESSION: 1. Numerous gallstones.  No secondary signs of acute cholecystitis. 2. Echogenic and atrophic right kidney compatible with medical renal disease. 3. Stable mildly enlarged common bile duct up to 9 mm. Electronically Signed   By: Kristine Garbe M.D.   On: 06/01/2017 03:06    ____________________________________________   PROCEDURES  Procedure(s) performed: None  Procedures  Critical Care performed: No  ____________________________________________   INITIAL IMPRESSION / ASSESSMENT AND PLAN / ED COURSE  Pertinent labs & imaging results that were available during my care of the patient were reviewed by me and considered in my medical decision making (see chart for details).  This is a 62 year old female who comes into the hospital today after a fall at home. She's been having some weakness as well as multiple falls and swelling in her legs. I did check some blood work and was found that the patient is anemic. The patient's hemoglobin is 7.8 and 25.2. The patient's last hemoglobin was 9.3 and 27.4 and May.  The patient also had some mild transaminitis with elevated bilirubin and alkaline phosphatase. The patient's creatinine is also slightly elevated. I did send the patient for an ultrasound and was found that the patient has an extensive DVT in her left lower extremity. Her CT scan did not show any acute intracranial hemorrhage, fractures and neither do her x-rays. I did go back to reevaluate the patient and she reports that she has had some blood in her stool but she does have chronic diarrhea. The patient's stool is yellow in appearance but it was heme positive. Although the patient has a DVT given her anemia I do not feel that giving her anticoagulation and sending her home as an appropriate course of therapy. I will admit the patient to the hospitalist service as she does have frequent falls and weakness. We'll also have the patient further evaluated given her anemia and her DVT. The patient will be admitted to the hospitalist service.      ____________________________________________   FINAL CLINICAL IMPRESSION(S) / ED DIAGNOSES  Final diagnoses:  Hip pain  Abdominal pain  Fall, initial encounter  Acute deep vein thrombosis (DVT) of femoral vein of left lower extremity (HCC)      NEW MEDICATIONS STARTED DURING THIS VISIT:  New Prescriptions   No medications on file     Note:  This document was prepared using Dragon voice recognition software and may include unintentional dictation errors.    Loney Hering, MD 06/01/17 [phone]

## 2017-06-01 NOTE — Progress Notes (Signed)
Pt has multiple abrasions on the left knee unsure how to document. Pt stated they occurred when she fell and was trying to get off the ground.

## 2017-06-01 NOTE — Progress Notes (Signed)
ANTICOAGULATION CONSULT NOTE - Initial Consult  Pharmacy Consult for apixaban dosing Indication: DVT  Allergies  Allergen Reactions  . Shrimp [Shellfish Allergy] Anaphylaxis    Patient Measurements: Height: 5\' 6"  (167.6 cm) Weight: 110 lb (49.9 kg) IBW/kg (Calculated) : 59.3 Heparin Dosing Weight: n/a  Vital Signs: Temp: 98.8 F (37.1 C) (07/25 2348) Temp Source: Oral (07/25 2348) BP: 92/70 (07/26 0600) Pulse Rate: 98 (07/26 0600)  Labs:  Recent Labs  05/31/17 2359  HGB 7.8*  HCT 25.2*  PLT 243  CREATININE 1.22*  TROPONINI 0.03*    Estimated Creatinine Clearance: 39.6 mL/min (A) (by C-G formula based on SCr of 1.22 mg/dL (H)).   Medical History: Past Medical History:  Diagnosis Date  . Anemia of chronic disease   . C. difficile diarrhea 10/2011  . Cancer ([place])   . Chronic cough   . Chronic systolic CHF (congestive heart failure) ([place])    a. echo 05/2015: EF 45-50%, nl wall motion, mod MR, nl PASP  . Depression   . Depression   . Fibromyalgia   . History of DVT (deep vein thrombosis)    a. s/p IVC filter 2014  . HX: breast cancer    a. right-side; b. s/p lumpectomy & chemo w/ Docetaxol, cytoxan, adriamycin  . Kidney stones 2013  . Malnutrition ([place])    a. chronic diarrhea s/p most meals   . Moderate mitral regurgitation   . Muscle spasm   . NICM (nonischemic cardiomyopathy) ([place])    a. Lexiscan 08/2015: no sig ischemia, no ECG changes concerning for ischemia (baseline abnormal T wave along anterolateral leads), EF 61%, low risk scan  . Orthostatic hypotension   . PAF (paroxysmal atrial fibrillation) ([place])    a. not on long term, full-dose anticoagulation since 2011, had high INR at that time 2/2 possible malnutition, has been off anticoagulation since; b. CHADS2VASc at least 2 (CHF, female); c. 48 hr Holter 09/2015 NSR w/ rare PACs, no significant PVCs, periods of sinus tachycarida with average heart rate typically in the 60-100 range. No sig arrhythmia.  No Afib seen.   . 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 Postural hypotension   . S/P gastric bypass   . Vitamin D deficiency     Medications:  No anticoagulation in PTA meds  Assessment:  Goal of Therapy:      Plan:  Apixaban 10 mg BID x 7 days and 5 mg BID thereafter ordered.  Reene Harlacher S 06/01/2017,6:49 AM

## 2017-06-01 NOTE — Consult Note (Signed)
[facility] Vascular Consult Note  MRN : [id number]  Lindsay [name] is a 61 y.o. (07-11-1956) female who presents with chief complaint of  Chief Complaint  Patient presents with  . Fall  . Weakness  .  History of Present Illness: I am asked by Dr. Tressia Miners to see the patient regarding need for IVC filter placement.  She has had multiple falls and is very frail and weak at this point. She had noticed some pain and swelling in her left leg and a recent duplex suggested extensive left lower extremity DVT. Her hemoglobin is in the 7 range and given her falls, anemia, and poor functional status she was not felt to be an anticoagulation candidate. For this reason we are consult for IVC filter placement. In discussion with the patient, she had a DVT some years ago and had a filter placed at that time. She does not think it was ever retrieved. She denies fever or chills. She denies chest pain or new shortness of breath.  Current Facility-Administered Medications  Medication Dose Route Frequency Provider Last Rate Last Dose  . 0.9 %  sodium chloride infusion   Intravenous Continuous Gladstone Lighter, MD      . acetaminophen (TYLENOL) tablet 325-650 mg  325-650 mg Oral Q6H PRN Harrie Foreman, MD       Or  . acetaminophen (TYLENOL) suppository 650 mg  650 mg Rectal Q6H PRN Harrie Foreman, MD      . anastrozole (ARIMIDEX) tablet 1 mg  1 mg Oral Daily Harrie Foreman, MD   1 mg at 06/01/17 1027  . apixaban (ELIQUIS) tablet 10 mg  10 mg Oral BID Harrie Foreman, MD   10 mg at 06/01/17 1026  . [START ON 2017/06/29] apixaban (ELIQUIS) tablet 5 mg  5 mg Oral BID Harrie Foreman, MD      . cholecalciferol (VITAMIN D) tablet 1,000 Units  1,000 Units Oral Daily Harrie Foreman, MD   1,000 Units at 06/01/17 1029  . clonazePAM (KLONOPIN) tablet 0.5 mg  0.5 mg Oral QHS PRN Harrie Foreman, MD      . docusate sodium (COLACE) capsule 100 mg  100 mg Oral BID Harrie Foreman, MD   100 mg at 06/01/17 1029  . feeding supplement (ENSURE ENLIVE) (ENSURE ENLIVE) liquid 237 mL  237 mL Oral BID BM Gladstone Lighter, MD   237 mL at 06/01/17 1454  . flecainide (TAMBOCOR) tablet 50 mg  50 mg Oral Q12H Harrie Foreman, MD   50 mg at 06/01/17 1027  . fludrocortisone (FLORINEF) tablet 0.2 mg  0.2 mg Oral Daily Harrie Foreman, MD   0.2 mg at 06/01/17 1026  . fluticasone (FLONASE) 50 MCG/ACT nasal spray 1 spray  1 spray Each Nare Daily Harrie Foreman, MD   1 spray at 06/01/17 1030  . lidocaine (LIDODERM) 5 % 2 patch  2 patch Transdermal Q24H Harrie Foreman, MD   2 patch at 06/01/17 0802  . loperamide (IMODIUM) capsule 2 mg  2 mg Oral QID PRN Harrie Foreman, MD      . loratadine (CLARITIN) tablet 10 mg  10 mg Oral Daily PRN Harrie Foreman, MD      . midodrine (PROAMATINE) tablet 10 mg  10 mg Oral Q breakfast Harrie Foreman, MD   10 mg at 06/01/17 0802  . midodrine (PROAMATINE) tablet 5 mg  5 mg Oral BID WC Harrie Foreman, MD  5 mg at 06/01/17 1157  . mirtazapine (REMERON) tablet 30 mg  30 mg Oral QHS Harrie Foreman, MD      . morphine (MS CONTIN) 12 hr tablet 15 mg  15 mg Oral Q8H Harrie Foreman, MD   15 mg at 06/01/17 1623  . ondansetron (ZOFRAN) tablet 4 mg  4 mg Oral Q6H PRN Harrie Foreman, MD       Or  . ondansetron [facility]) injection 4 mg  4 mg Intravenous Q6H PRN Harrie Foreman, MD      . oxyCODONE (Oxy IR/ROXICODONE) immediate release tablet 10-15 mg  10-15 mg Oral Q6H PRN Gladstone Lighter, MD      . pantoprazole (PROTONIX) EC tablet 40 mg  40 mg Oral Daily Harrie Foreman, MD   40 mg at 06/01/17 1029  . potassium chloride (K-DUR,KLOR-CON) CR tablet 20 mEq  20 mEq Oral BID Harrie Foreman, MD   20 mEq at 06/01/17 1029  . pregabalin (LYRICA) capsule 150 mg  150 mg Oral BID Harrie Foreman, MD   150 mg at 06/01/17 1025  . sertraline (ZOLOFT) tablet 150 mg  150 mg Oral Daily Harrie Foreman, MD   150 mg at  06/01/17 1025  . triamcinolone cream (KENALOG) 0.1 % 1 application  1 application Topical BID Harrie Foreman, MD      . vancomycin (VANCOCIN) 50 mg/mL oral solution 125 mg  125 mg Oral Q6H Gladstone Lighter, MD   125 mg at 06/01/17 1454  . vitamin B-12 (CYANOCOBALAMIN) tablet 1,000 mcg  1,000 mcg Oral Daily Harrie Foreman, MD   1,000 mcg at 06/01/17 1029  . vitamin C (ASCORBIC ACID) tablet 500 mg  500 mg Oral Daily Harrie Foreman, MD   500 mg at 06/01/17 1029    Past Medical History:  Diagnosis Date  . Anemia of chronic disease   . C. difficile diarrhea 10/2011  . Cancer ([place])   . Chronic cough   . Chronic systolic CHF (congestive heart failure) ([place])    a. echo 05/2015: EF 45-50%, nl wall motion, mod MR, nl PASP  . Depression   . Depression   . Fibromyalgia   . History of DVT (deep vein thrombosis)    a. s/p IVC filter 2014  . HX: breast cancer    a. right-side; b. s/p lumpectomy & chemo w/ Docetaxol, cytoxan, adriamycin  . Kidney stones 2013  . Malnutrition ([place])    a. chronic diarrhea s/p most meals   . Moderate mitral regurgitation   . Muscle spasm   . NICM (nonischemic cardiomyopathy) ([place])    a. Lexiscan 08/2015: no sig ischemia, no ECG changes concerning for ischemia (baseline abnormal T wave along anterolateral leads), EF 61%, low risk scan  . Orthostatic hypotension   . PAF (paroxysmal atrial fibrillation) ([place])    a. not on long term, full-dose anticoagulation since 2011, had high INR at that time 2/2 possible malnutition, has been off anticoagulation since; b. CHADS2VASc at least 2 (CHF, female); c. 48 hr Holter 09/2015 NSR w/ rare PACs, no significant PVCs, periods of sinus tachycarida with average heart rate typically in the 60-100 range. No sig arrhythmia. No Afib seen.   . 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 Postural hypotension   . S/P gastric bypass   . Vitamin D deficiency     Past Surgical History:  Procedure Laterality Date  .  ABDOMINAL HYSTERECTOMY    .  BREAST LUMPECTOMY     right @ [facility]  . ESOPHAGOGASTRODUODENOSCOPY (EGD) WITH PROPOFOL N/A 05/25/2015   Procedure: ESOPHAGOGASTRODUODENOSCOPY (EGD) WITH PROPOFOL;  Surgeon: Manya Silvas, MD;  Location: [facility] ENDOSCOPY;  Service: Endoscopy;  Laterality: N/A;  . LITHOTRIPSY  jan 2013    Social History Social History  Substance Use Topics  . Smoking status: Former Smoker    Packs/day: 0.25    Years: 1.00    Types: Cigarettes    Quit date: 11/10/1985  . Smokeless tobacco: Never Used  . Alcohol use No    Family History Family History  Problem Relation Age of Onset  . CAD Father        MI  . Diabetes Mellitus II Father   . Hypertension Father   . Diabetes Mellitus II Sister     Allergies  Allergen Reactions  . Shrimp [Shellfish Allergy] Anaphylaxis     REVIEW OF SYSTEMS (Negative unless checked)  Constitutional: [x] Weight loss  [] Fever  [] Chills Cardiac: [] Chest pain   [] Chest pressure   [x] Palpitations   [] Shortness of breath when laying flat   [] Shortness of breath at rest   [] Shortness of breath with exertion. Vascular:  [] Pain in legs with walking   [] Pain in legs at rest   [] Pain in legs when laying flat   [] Claudication   [] Pain in feet when walking  [] Pain in feet at rest  [] Pain in feet when laying flat   [x] History of DVT   [x] Phlebitis   [x] Swelling in legs   [] Varicose veins   [] Non-healing ulcers Pulmonary:   [] Uses home oxygen   [] Productive cough   [] Hemoptysis   [] Wheeze  [] COPD   [] Asthma Neurologic:  [] Dizziness  [] Blackouts   [] Seizures   [] History of stroke   [] History of TIA  [] Aphasia   [] Temporary blindness   [] Dysphagia   [] Weakness or numbness in arms   [] Weakness or numbness in legs Musculoskeletal:  [x] Arthritis   [] Joint swelling   [] Joint pain   [] Low back pain Hematologic:  [] Easy bruising  [x] Easy bleeding   [] Hypercoagulable state   [x] Anemic  [] Hepatitis Gastrointestinal:  [x] Blood in stool   [] Vomiting blood   [] Gastroesophageal reflux/heartburn   [] Difficulty swallowing. Genitourinary:  [] Chronic kidney disease   [] Difficult urination  [] Frequent urination  [] Burning with urination   [] Blood in urine Skin:  [] Rashes   [] Ulcers   [] Wounds Psychological:  [] History of anxiety   []  History of major depression.  Physical Examination  Vitals:   06/01/17 0648 06/01/17 0801 06/01/17 1209 06/01/17 1640  BP: 93/64 (!) 92/55 90/70 96/65   Pulse: 93   83  Resp: (!) 22     Temp: 98.2 F (36.8 C)   98.1 F (36.7 C)  TempSrc: Oral   Oral  SpO2: 98%   100%  Weight:      Height:       Body mass index is 17.75 kg/m. Gen:  Cachectic and frail appearing, NAD Head: [place]/AT, + temporalis wasting.  Ear/Nose/Throat: Hearing grossly intact, nares w/o erythema or drainage, oropharynx w/o Erythema/Exudate Eyes: Sclera non-icteric, conjunctiva clear Neck: Trachea midline.  No JVD.  Pulmonary:  Good air movement, respirations not labored, equal bilaterally.  Cardiac: irregularly irregular Vascular:  Vessel Right Left  Radial Palpable Palpable                          PT Palpable Palpable  DP Palpable Palpable    Musculoskeletal: M/S 5/5 throughout.  Extremities without ischemic changes.  No deformity or atrophy. Trace RLE edema, 1-2+ LLE edema. Neurologic: Sensation grossly intact in extremities.  Symmetrical.  Speech is fluent. Motor exam as listed above. Psychiatric: Judgment intact, Mood & affect appropriate for pt's clinical situation. Dermatologic: No rashes or ulcers noted.  No cellulitis or open wounds.       CBC Lab Results  Component Value Date   WBC 14.3 (H) 05/31/2017   HGB 7.8 (L) 05/31/2017   HCT 25.2 (L) 05/31/2017   MCV 96.4 05/31/2017   PLT 243 05/31/2017    BMET    Component Value Date/Time   NA 141 05/31/2017 2359   NA 145 11/25/2014 0123   K 4.7 05/31/2017 2359   K 3.8 11/25/2014 0123   CL 117 (H) 05/31/2017 2359   CL 114 (H) 11/25/2014 0123   CO2 18 (L)  05/31/2017 2359   CO2 25 11/25/2014 0123   GLUCOSE 83 05/31/2017 2359   GLUCOSE 93 11/25/2014 0123   BUN 21 (H) 05/31/2017 2359   BUN 13 11/25/2014 0123   CREATININE 1.22 (H) 05/31/2017 2359   CREATININE 0.85 11/25/2014 0123   CALCIUM 8.5 (L) 05/31/2017 2359   CALCIUM 8.5 11/25/2014 0123   GFRNONAA 48 (L) 05/31/2017 2359   GFRNONAA >60 11/25/2014 0123   GFRNONAA 52 (L) 07/02/2013 0505   GFRAA 55 (L) 05/31/2017 2359   GFRAA >60 11/25/2014 0123   GFRAA >60 07/02/2013 0505   Estimated Creatinine Clearance: 39.6 mL/min (A) (by C-G formula based on SCr of 1.22 mg/dL (H)).  COAG Lab Results  Component Value Date   INR 1.01 01/05/2017   INR 1.3 06/18/2013    Radiology Dg Chest 2 View  Result Date: 06/01/2017 CLINICAL DATA:  Left-sided pain after a fall. Multiple falls this week. EXAM: CHEST  2 VIEW COMPARISON:  03/22/2017 FINDINGS: Hyperinflation suggesting emphysema. Normal heart size and pulmonary vascularity. No focal airspace disease or consolidation in the lungs. No blunting of costophrenic angles. No pneumothorax. Mediastinal contours appear intact. Visualize ribs appear intact. Surgical clips in the right axilla and left upper quadrant. Degenerative changes in the spine. IMPRESSION: Pulmonary hyperinflation.  No evidence of active pulmonary disease. Electronically Signed   By: Lucienne Capers M.D.   On: 06/01/2017 01:13   Ct Head Wo Contrast  Result Date: 06/01/2017 CLINICAL DATA:  [place] from bedside commode to floor tonight. EXAM: CT HEAD WITHOUT CONTRAST CT CERVICAL SPINE WITHOUT CONTRAST TECHNIQUE: Multidetector CT imaging of the head and cervical spine was performed following the standard protocol without intravenous contrast. Multiplanar CT image reconstructions of the cervical spine were also generated. COMPARISON:  01/06/2017, 03/24/2017. FINDINGS: CT HEAD FINDINGS Brain: There is no intracranial hemorrhage, mass or evidence of acute infarction. There is moderate generalized  atrophy. There is moderate chronic microvascular ischemic change. There is no significant extra-axial fluid collection. No acute intracranial findings are evident. Vascular: No hyperdense vessel or unexpected calcification. Skull: Normal. Negative for fracture or focal lesion. Sinuses/Orbits: No acute finding. Other: None. CT CERVICAL SPINE FINDINGS Alignment: Normal. Skull base and vertebrae: No acute fracture. No primary bone lesion or focal pathologic process. Soft tissues and spinal canal: No prevertebral fluid or swelling. No visible canal hematoma. Disc levels: Mild cervical degenerative disc changes at C6-7. Facet articulations are well-preserved and intact. Upper chest: Negative. Other: None IMPRESSION: 1. No acute intracranial findings. There is moderate generalized atrophy and chronic appearing white matter hypodensities which likely represent small vessel ischemic disease. 2. Negative for acute cervical  spine fracture. Electronically Signed   By: Andreas Newport M.D.   On: 06/01/2017 01:31   Ct Cervical Spine Wo Contrast  Result Date: 06/01/2017 CLINICAL DATA:  [place] from bedside commode to floor tonight. EXAM: CT HEAD WITHOUT CONTRAST CT CERVICAL SPINE WITHOUT CONTRAST TECHNIQUE: Multidetector CT imaging of the head and cervical spine was performed following the standard protocol without intravenous contrast. Multiplanar CT image reconstructions of the cervical spine were also generated. COMPARISON:  01/06/2017, 03/24/2017. FINDINGS: CT HEAD FINDINGS Brain: There is no intracranial hemorrhage, mass or evidence of acute infarction. There is moderate generalized atrophy. There is moderate chronic microvascular ischemic change. There is no significant extra-axial fluid collection. No acute intracranial findings are evident. Vascular: No hyperdense vessel or unexpected calcification. Skull: Normal. Negative for fracture or focal lesion. Sinuses/Orbits: No acute finding. Other: None. CT CERVICAL SPINE  FINDINGS Alignment: Normal. Skull base and vertebrae: No acute fracture. No primary bone lesion or focal pathologic process. Soft tissues and spinal canal: No prevertebral fluid or swelling. No visible canal hematoma. Disc levels: Mild cervical degenerative disc changes at C6-7. Facet articulations are well-preserved and intact. Upper chest: Negative. Other: None IMPRESSION: 1. No acute intracranial findings. There is moderate generalized atrophy and chronic appearing white matter hypodensities which likely represent small vessel ischemic disease. 2. Negative for acute cervical spine fracture. Electronically Signed   By: Andreas Newport M.D.   On: 06/01/2017 01:31   US Venous Img Lower Unilateral Left  Result Date: 06/01/2017 CLINICAL DATA:  Left leg swelling. EXAM: Left LOWER EXTREMITY VENOUS DOPPLER ULTRASOUND TECHNIQUE: Gray-scale sonography with graded compression, as well as color Doppler and duplex ultrasound were performed to evaluate the lower extremity deep venous systems from the level of the common femoral vein and including the common femoral, femoral, profunda femoral, popliteal and calf veins including the posterior tibial, peroneal and gastrocnemius veins when visible. The superficial great saphenous vein was also interrogated. Spectral Doppler was utilized to evaluate flow at rest and with distal augmentation maneuvers in the common femoral, femoral and popliteal veins. COMPARISON:  None. FINDINGS: Contralateral Common Femoral Vein: Respiratory phasicity is normal and symmetric with the symptomatic side. No evidence of thrombus. Normal compressibility. Common Femoral Vein: No evidence of thrombus. Normal compressibility, respiratory phasicity and response to augmentation. Saphenofemoral Junction: No evidence of thrombus. Normal compressibility and flow on color Doppler imaging. Profunda Femoral Vein: Nonocclusive thrombus is demonstrated. Incomplete compression and flow signal. Femoral Vein:  Occlusive thrombus demonstrated without compression or flow signal. Popliteal Vein: Nonocclusive thrombus is demonstrated. Incomplete compression and flow signal. Calf Veins: Occlusive thrombus is demonstrated. Non compression and no flow signal identified. Superficial Great Saphenous Vein: No evidence of thrombus. Normal compressibility and flow on color Doppler imaging. Venous Reflux:  None. Other Findings:  None. IMPRESSION: Examination is positive for deep venous thrombosis involving the left femoral, popliteal, calf, and profunda femoral veins. Critical results were called by telephone at the time of interpretation on 06/01/2017 at 3:06 am to Dr. Charlesetta Ivory , who verbally acknowledged these results. Electronically Signed   By: Lucienne Capers M.D.   On: 06/01/2017 03:08   Dg Hip Unilat With Pelvis 2-3 Views Left  Result Date: 06/01/2017 CLINICAL DATA:  Left-sided pain after a fall. EXAM: DG HIP (WITH OR WITHOUT PELVIS) 2-3V LEFT COMPARISON:  None. FINDINGS: Degenerative changes in the hips. Left hip demonstrates degenerative sclerosis and joint space narrowing in the superior acetabulum with small osteophytes on both joint surfaces. No evidence of acute fracture  or dislocation. No focal bone lesion or bone destruction. Pelvis appears intact. SI joints and symphysis pubis are not displaced. IMPRESSION: Mild degenerative changes in the left hip. No acute fracture or dislocation. Electronically Signed   By: Lucienne Capers M.D.   On: 06/01/2017 01:15   US Abdomen Limited Ruq  Result Date: 06/01/2017 CLINICAL DATA:  61 y/o  F; generalized abdominal pain for 1 day. EXAM: ULTRASOUND ABDOMEN LIMITED RIGHT UPPER QUADRANT COMPARISON:  03/30/2017 CT abdomen and pelvis. FINDINGS: Gallbladder: No gallbladder wall thickening or pericholecystic fluid. Numerous shadowing gallstones. Negative sonographic Murphy's sign. Common bile duct: Diameter: 8.7 mm Liver: Normal liver echogenicity. No focal liver lesion  identified. Increased echogenicity of the right kidney and atrophy of compatible with medical renal disease. IMPRESSION: 1. Numerous gallstones.  No secondary signs of acute cholecystitis. 2. Echogenic and atrophic right kidney compatible with medical renal disease. 3. Stable mildly enlarged common bile duct up to 9 mm. Electronically Signed   By: Kristine Garbe M.D.   On: 06/01/2017 03:06      Assessment/Plan 1. Left lower extremity DVT in a debilitated frail patient with severe anemia who has a contraindication to anticoagulation. She already has an old Cordis filter in place that cannot be removed based on her CT scan back in May of this year. There was actually some thrombus on the filter at that time that was not mentioned by radiology, but by and large the filter is in place and would protect her from pulmonary emboli. No new philtral need to be placed and no further vascular surgery recommendations would be made at this time other than elevation and compression of the leg with a DVT. 2. Cachexia. Poor functional status. Clearly a thrombotic risk. 3. C. difficile colitis. Starting on oral vancomycin. Would also increase her risk of GI bleeding with anticoagulation. 4. Ischemic cardiomyopathy/chronic congestive heart failure. Poor functional status. Not a candidate for anticoagulation.   Leotis Pain, MD  06/01/2017 5:19 PM    This note was created with Dragon medical transcription system.  Any error is purely unintentional

## 2017-06-01 NOTE — H&P (Signed)
Lindsay [name] is an 61 y.o. female.   Chief Complaint: Fall HPI: The patient with past medical history of chronic systolic heart failure, DVT, atrial fibrillation and history of breast cancer presents to the emergency department after a fall. The patient reports feeling weak and sliding off of her bedside commode this evening. She states that she hit her head on her bed frame. She has a history of falls. CT of the head was negative for acute intracranial process. She also reports a swollen painful leg. Ultrasound of the lower extremity revealed significant DVT. The patient denies fevers, chest pain or shortness of breath. Laboratory evaluation also significant for acute kidney injury and elevated troponin which prompted the emergency department staff to call the hospitalist service for admission.  Past Medical History:  Diagnosis Date  . Anemia of chronic disease   . C. difficile diarrhea 10/2011  . Cancer ([place])   . Chronic cough   . Chronic systolic CHF (congestive heart failure) ([place])    a. echo 05/2015: EF 45-50%, nl wall motion, mod MR, nl PASP  . Depression   . Depression   . Fibromyalgia   . History of DVT (deep vein thrombosis)    a. s/p IVC filter 2014  . HX: breast cancer    a. right-side; b. s/p lumpectomy & chemo w/ Docetaxol, cytoxan, adriamycin  . Kidney stones 2013  . Malnutrition ([place])    a. chronic diarrhea s/p most meals   . Moderate mitral regurgitation   . Muscle spasm   . NICM (nonischemic cardiomyopathy) ([place])    a. Lexiscan 08/2015: no sig ischemia, no ECG changes concerning for ischemia (baseline abnormal T wave along anterolateral leads), EF 61%, low risk scan  . Orthostatic hypotension   . PAF (paroxysmal atrial fibrillation) ([place])    a. not on long term, full-dose anticoagulation since 2011, had high INR at that time 2/2 possible malnutition, has been off anticoagulation since; b. CHADS2VASc at least 2 (CHF, female); c. 48 hr Holter 09/2015 NSR w/ rare PACs, no  significant PVCs, periods of sinus tachycarida with average heart rate typically in the 60-100 range. No sig arrhythmia. No Afib seen.   . 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 Postural hypotension   . S/P gastric bypass   . Vitamin D deficiency     Past Surgical History:  Procedure Laterality Date  . ABDOMINAL HYSTERECTOMY    . BREAST LUMPECTOMY     right @ [facility]  . ESOPHAGOGASTRODUODENOSCOPY (EGD) WITH PROPOFOL N/A 05/25/2015   Procedure: ESOPHAGOGASTRODUODENOSCOPY (EGD) WITH PROPOFOL;  Surgeon: Manya Silvas, MD;  Location: [facility] ENDOSCOPY;  Service: Endoscopy;  Laterality: N/A;  . LITHOTRIPSY  jan 2013    Family History  Problem Relation Age of Onset  . CAD Father        MI  . Diabetes Mellitus II Father   . Hypertension Father   . Diabetes Mellitus II Sister    Social History:  reports that she quit smoking about 31 years ago. Her smoking use included Cigarettes. She has a 0.25 pack-year smoking history. She has never used smokeless tobacco. She reports that she does not drink alcohol or use drugs.  Allergies:  Allergies  Allergen Reactions  . Shrimp [Shellfish Allergy] Anaphylaxis    Medications Prior to Admission  Medication Sig Dispense Refill  . anastrozole (ARIMIDEX) 1 MG tablet Take 1 mg by mouth daily.      . Cholecalciferol (VITAMIN D3) 1000 units CAPS Take  1,000 Units by mouth daily.    . clonazePAM (KLONOPIN) 0.5 MG tablet Take 0.5 mg by mouth at bedtime as needed for anxiety.     . flecainide (TAMBOCOR) 100 MG tablet Take 0.5 tablets (50 mg total) by mouth every 12 (twelve) hours. 60 tablet 0  . fludrocortisone (FLORINEF) 0.1 MG tablet Take 0.2 mg by mouth daily.    . fluticasone (FLONASE) 50 MCG/ACT nasal spray Place 1 spray into the nose daily.     Marland Kitchen lidocaine (LIDODERM) 5 % Place 1-3 patches onto the skin daily. Remove & Discard patch within 12 hours or as directed by MD     . loperamide (IMODIUM) 2 MG capsule Take 2 mg by mouth 4 (four) times  daily as needed for diarrhea or loose stools.    Marland Kitchen loratadine (CLARITIN) 10 MG tablet Take 10 mg by mouth daily as needed for allergies.     . midodrine (PROAMATINE) 10 MG tablet Take 1 tablet (10 mg total) by mouth 3 (three) times daily with meals. (Patient taking differently: Take 5-10 mg by mouth 3 (three) times daily with meals. Take 10 mg in the morning and 5 mg at noon and supper) 90 tablet 0  . mirtazapine (REMERON) 30 MG tablet Take 1 tablet (30 mg total) by mouth at bedtime. (Patient taking differently: Take 15 mg by mouth at bedtime. ) 30 tablet 2  . morphine (MS CONTIN) 15 MG 12 hr tablet Take 1 tablet (15 mg total) by mouth every 8 (eight) hours. 4 tablet 0  . omeprazole (PRILOSEC) 40 MG capsule Take 1 capsule by mouth daily.    Marland Kitchen oxyCODONE (ROXICODONE) 15 MG immediate release tablet Take 15 mg by mouth every 6 (six) hours as needed for pain.    . potassium chloride (K-DUR) 10 MEQ tablet Take 20 mEq by mouth 2 (two) times daily.    . pregabalin (LYRICA) 150 MG capsule Take 1 capsule (150 mg total) by mouth 2 (two) times daily. 60 capsule 0  . sertraline (ZOLOFT) 100 MG tablet Take 150 mg by mouth daily.     Marland Kitchen triamcinolone cream (KENALOG) 0.1 % Apply 1 application topically 2 (two) times daily.    . vitamin B-12 (CYANOCOBALAMIN) 1000 MCG tablet Take 1,000 mcg by mouth daily.     . vitamin C (ASCORBIC ACID) 500 MG tablet Take 500 mg by mouth daily.      Results for orders placed or performed during the hospital encounter of 05/31/17 (from the past 48 hour(s))  CBC with Differential     Status: Abnormal   Collection Time: 05/31/17 11:59 PM  Result Value Ref Range   WBC 14.3 (H) 3.6 - 11.0 K/uL   RBC 2.61 (L) 3.80 - 5.20 MIL/uL   Hemoglobin 7.8 (L) 12.0 - 16.0 g/dL   HCT 25.2 (L) 35.0 - 47.0 %   MCV 96.4 80.0 - 100.0 fL   MCH 30.0 26.0 - 34.0 pg   MCHC 31.1 (L) 32.0 - 36.0 g/dL   RDW 20.3 (H) 11.5 - 14.5 %   Platelets 243 150 - 440 K/uL   Neutrophils Relative % 87 %   Neutro Abs  12.4 (H) 1.4 - 6.5 K/uL   Lymphocytes Relative 9 %   Lymphs Abs 1.3 1.0 - 3.6 K/uL   Monocytes Relative 4 %   Monocytes Absolute 0.5 0.2 - 0.9 K/uL   Eosinophils Relative 0 %   Eosinophils Absolute 0.0 0 - 0.7 K/uL   Basophils Relative 0 %  Basophils Absolute 0.0 0 - 0.1 K/uL  Comprehensive metabolic panel     Status: Abnormal   Collection Time: 05/31/17 11:59 PM  Result Value Ref Range   Sodium 141 135 - 145 mmol/L   Potassium 4.7 3.5 - 5.1 mmol/L    Comment: HEMOLYSIS AT THIS LEVEL MAY AFFECT RESULT   Chloride 117 (H) 101 - 111 mmol/L   CO2 18 (L) 22 - 32 mmol/L   Glucose, Bld 83 65 - 99 mg/dL   BUN 21 (H) 6 - 20 mg/dL   Creatinine, Ser 1.22 (H) 0.44 - 1.00 mg/dL   Calcium 8.5 (L) 8.9 - 10.3 mg/dL   Total Protein 4.9 (L) 6.5 - 8.1 g/dL   Albumin 2.2 (L) 3.5 - 5.0 g/dL   AST 45 (H) 15 - 41 U/L   ALT 27 14 - 54 U/L   Alkaline Phosphatase 235 (H) 38 - 126 U/L   Total Bilirubin 1.6 (H) 0.3 - 1.2 mg/dL   GFR calc non Af Amer 48 (L) >60 mL/min   GFR calc Af Amer 55 (L) >60 mL/min    Comment: (NOTE) The eGFR has been calculated using the CKD EPI equation. This calculation has not been validated in all clinical situations. eGFR's persistently <60 mL/min signify possible Chronic Kidney Disease.    Anion gap 6 5 - 15  Troponin I     Status: Abnormal   Collection Time: 05/31/17 11:59 PM  Result Value Ref Range   Troponin I 0.03 (HH) <0.03 ng/mL    Comment: CRITICAL RESULT CALLED TO, READ BACK BY AND VERIFIED WITH KAILEY WALKER @ 1017 ON 06/01/2017 BY CAF   Lipase, blood     Status: None   Collection Time: 05/31/17 11:59 PM  Result Value Ref Range   Lipase 15 11 - 51 U/L  Lactic acid, plasma     Status: None   Collection Time: 06/01/17  1:21 AM  Result Value Ref Range   Lactic Acid, Venous 0.9 0.5 - 1.9 mmol/L   Dg Chest 2 View  Result Date: 06/01/2017 CLINICAL DATA:  Left-sided pain after a fall. Multiple falls this week. EXAM: CHEST  2 VIEW COMPARISON:  03/22/2017  FINDINGS: Hyperinflation suggesting emphysema. Normal heart size and pulmonary vascularity. No focal airspace disease or consolidation in the lungs. No blunting of costophrenic angles. No pneumothorax. Mediastinal contours appear intact. Visualize ribs appear intact. Surgical clips in the right axilla and left upper quadrant. Degenerative changes in the spine. IMPRESSION: Pulmonary hyperinflation.  No evidence of active pulmonary disease. Electronically Signed   By: Lucienne Capers M.D.   On: 06/01/2017 01:13   Ct Head Wo Contrast  Result Date: 06/01/2017 CLINICAL DATA:  [place] from bedside commode to floor tonight. EXAM: CT HEAD WITHOUT CONTRAST CT CERVICAL SPINE WITHOUT CONTRAST TECHNIQUE: Multidetector CT imaging of the head and cervical spine was performed following the standard protocol without intravenous contrast. Multiplanar CT image reconstructions of the cervical spine were also generated. COMPARISON:  01/06/2017, 03/24/2017. FINDINGS: CT HEAD FINDINGS Brain: There is no intracranial hemorrhage, mass or evidence of acute infarction. There is moderate generalized atrophy. There is moderate chronic microvascular ischemic change. There is no significant extra-axial fluid collection. No acute intracranial findings are evident. Vascular: No hyperdense vessel or unexpected calcification. Skull: Normal. Negative for fracture or focal lesion. Sinuses/Orbits: No acute finding. Other: None. CT CERVICAL SPINE FINDINGS Alignment: Normal. Skull base and vertebrae: No acute fracture. No primary bone lesion or focal pathologic process. Soft tissues and spinal  canal: No prevertebral fluid or swelling. No visible canal hematoma. Disc levels: Mild cervical degenerative disc changes at C6-7. Facet articulations are well-preserved and intact. Upper chest: Negative. Other: None IMPRESSION: 1. No acute intracranial findings. There is moderate generalized atrophy and chronic appearing white matter hypodensities which likely  represent small vessel ischemic disease. 2. Negative for acute cervical spine fracture. Electronically Signed   By: Andreas Newport M.D.   On: 06/01/2017 01:31   Ct Cervical Spine Wo Contrast  Result Date: 06/01/2017 CLINICAL DATA:  [place] from bedside commode to floor tonight. EXAM: CT HEAD WITHOUT CONTRAST CT CERVICAL SPINE WITHOUT CONTRAST TECHNIQUE: Multidetector CT imaging of the head and cervical spine was performed following the standard protocol without intravenous contrast. Multiplanar CT image reconstructions of the cervical spine were also generated. COMPARISON:  01/06/2017, 03/24/2017. FINDINGS: CT HEAD FINDINGS Brain: There is no intracranial hemorrhage, mass or evidence of acute infarction. There is moderate generalized atrophy. There is moderate chronic microvascular ischemic change. There is no significant extra-axial fluid collection. No acute intracranial findings are evident. Vascular: No hyperdense vessel or unexpected calcification. Skull: Normal. Negative for fracture or focal lesion. Sinuses/Orbits: No acute finding. Other: None. CT CERVICAL SPINE FINDINGS Alignment: Normal. Skull base and vertebrae: No acute fracture. No primary bone lesion or focal pathologic process. Soft tissues and spinal canal: No prevertebral fluid or swelling. No visible canal hematoma. Disc levels: Mild cervical degenerative disc changes at C6-7. Facet articulations are well-preserved and intact. Upper chest: Negative. Other: None IMPRESSION: 1. No acute intracranial findings. There is moderate generalized atrophy and chronic appearing white matter hypodensities which likely represent small vessel ischemic disease. 2. Negative for acute cervical spine fracture. Electronically Signed   By: Andreas Newport M.D.   On: 06/01/2017 01:31   US Venous Img Lower Unilateral Left  Result Date: 06/01/2017 CLINICAL DATA:  Left leg swelling. EXAM: Left LOWER EXTREMITY VENOUS DOPPLER ULTRASOUND TECHNIQUE: Gray-scale  sonography with graded compression, as well as color Doppler and duplex ultrasound were performed to evaluate the lower extremity deep venous systems from the level of the common femoral vein and including the common femoral, femoral, profunda femoral, popliteal and calf veins including the posterior tibial, peroneal and gastrocnemius veins when visible. The superficial great saphenous vein was also interrogated. Spectral Doppler was utilized to evaluate flow at rest and with distal augmentation maneuvers in the common femoral, femoral and popliteal veins. COMPARISON:  None. FINDINGS: Contralateral Common Femoral Vein: Respiratory phasicity is normal and symmetric with the symptomatic side. No evidence of thrombus. Normal compressibility. Common Femoral Vein: No evidence of thrombus. Normal compressibility, respiratory phasicity and response to augmentation. Saphenofemoral Junction: No evidence of thrombus. Normal compressibility and flow on color Doppler imaging. Profunda Femoral Vein: Nonocclusive thrombus is demonstrated. Incomplete compression and flow signal. Femoral Vein: Occlusive thrombus demonstrated without compression or flow signal. Popliteal Vein: Nonocclusive thrombus is demonstrated. Incomplete compression and flow signal. Calf Veins: Occlusive thrombus is demonstrated. Non compression and no flow signal identified. Superficial Great Saphenous Vein: No evidence of thrombus. Normal compressibility and flow on color Doppler imaging. Venous Reflux:  None. Other Findings:  None. IMPRESSION: Examination is positive for deep venous thrombosis involving the left femoral, popliteal, calf, and profunda femoral veins. Critical results were called by telephone at the time of interpretation on 06/01/2017 at 3:06 am to Dr. Charlesetta Ivory , who verbally acknowledged these results. Electronically Signed   By: Lucienne Capers M.D.   On: 06/01/2017 03:08   Dg Hip Unilat With  Pelvis 2-3 Views Left  Result Date:  06/01/2017 CLINICAL DATA:  Left-sided pain after a fall. EXAM: DG HIP (WITH OR WITHOUT PELVIS) 2-3V LEFT COMPARISON:  None. FINDINGS: Degenerative changes in the hips. Left hip demonstrates degenerative sclerosis and joint space narrowing in the superior acetabulum with small osteophytes on both joint surfaces. No evidence of acute fracture or dislocation. No focal bone lesion or bone destruction. Pelvis appears intact. SI joints and symphysis pubis are not displaced. IMPRESSION: Mild degenerative changes in the left hip. No acute fracture or dislocation. Electronically Signed   By: Lucienne Capers M.D.   On: 06/01/2017 01:15   US Abdomen Limited Ruq  Result Date: 06/01/2017 CLINICAL DATA:  61 y/o  F; generalized abdominal pain for 1 day. EXAM: ULTRASOUND ABDOMEN LIMITED RIGHT UPPER QUADRANT COMPARISON:  03/30/2017 CT abdomen and pelvis. FINDINGS: Gallbladder: No gallbladder wall thickening or pericholecystic fluid. Numerous shadowing gallstones. Negative sonographic Murphy's sign. Common bile duct: Diameter: 8.7 mm Liver: Normal liver echogenicity. No focal liver lesion identified. Increased echogenicity of the right kidney and atrophy of compatible with medical renal disease. IMPRESSION: 1. Numerous gallstones.  No secondary signs of acute cholecystitis. 2. Echogenic and atrophic right kidney compatible with medical renal disease. 3. Stable mildly enlarged common bile duct up to 9 mm. Electronically Signed   By: Kristine Garbe M.D.   On: 06/01/2017 03:06    Review of Systems  Constitutional: Negative for chills and fever.  HENT: Negative for sore throat and tinnitus.   Eyes: Negative for blurred vision and redness.  Respiratory: Negative for cough and shortness of breath.   Cardiovascular: Negative for chest pain, palpitations, orthopnea and PND.  Gastrointestinal: Positive for diarrhea. Negative for abdominal pain, nausea and vomiting.  Genitourinary: Negative for dysuria, frequency and  urgency.  Musculoskeletal: Positive for falls. Negative for joint pain and myalgias.  Skin: Negative for rash.       No lesions  Neurological: Positive for weakness. Negative for speech change and focal weakness.  Endo/Heme/Allergies: Does not bruise/bleed easily.       No temperature intolerance  Psychiatric/Behavioral: Negative for depression and suicidal ideas.    Blood pressure 93/64, pulse 93, temperature 98.2 F (36.8 C), temperature source Oral, resp. rate (!) 22, height 5' 6"  (1.676 m), weight 49.9 kg (110 lb), SpO2 98 %. Physical Exam  Vitals reviewed. Constitutional: She is oriented to person, place, and time. She appears well-developed and well-nourished.  HENT:  Head: Normocephalic and atraumatic.  Mouth/Throat: Oropharynx is clear and moist.  Eyes: Pupils are equal, round, and reactive to light. Conjunctivae and EOM are normal. No scleral icterus.  Neck: Normal range of motion. Neck supple. No JVD present. No tracheal deviation present. No thyromegaly present.  Cardiovascular: Normal rate, regular rhythm and normal heart sounds.  Exam reveals no gallop and no friction rub.   No murmur heard. Respiratory: Effort normal and breath sounds normal.  GI: Soft. Bowel sounds are normal. She exhibits no distension. There is no tenderness.  Genitourinary:  Genitourinary Comments: Deferred  Musculoskeletal: Normal range of motion. She exhibits no edema.  Lymphadenopathy:    She has no cervical adenopathy.  Neurological: She is alert and oriented to person, place, and time. No cranial nerve deficit. She exhibits normal muscle tone.  Skin: Skin is warm and dry. No rash noted. No erythema.  Psychiatric: She has a normal mood and affect. Her behavior is normal. Judgment and thought content normal.     Assessment/Plan This is a 60 year old  female admitted for acute kidney injury. 1. Acute kidney injury: Likely prerenal. Hydrate with intravenous fluid. Avoid nephrotoxic agents. 2.  DVT: Recurrent; associated leukocytosis. Start Eliquis 3. Anemia: Acute on chronic; stool is trace Hemoccult positive. Continue to monitor H&H 4. Diarrhea: Yellow and runny. Likely secondary to poor probiotic intake. Test for infectious diarrhea. 5. Weakness: Secondary to above 2 problems. Hydrate with intravenous fluid. Encourage by mouth intake. Continue Remeron as well as Midodrine and Florinef 6. Atrial fibrillation: Rate controlled; continue flecainide 7. History of breast cancer: Continue Arimidex 8. Depression: Continue Zoloft and Klonopin 9. Chronic pain syndrome: Continue long-acting oxycodone, Lyrica, Lidoderm patch and acute narcotic analgesia. Consider decreasing her current regimen. 10. DVT prophylaxis: Therapeutic anticoagulation 11. GI prophylaxis: PPI per home regimen The patient is a full code. Time spent on admission orders and patient care possibly 45 minutes  Harrie Foreman, MD 06/01/2017, 7:35 AM

## 2017-06-02 ENCOUNTER — Encounter
Admission: EM | Disposition: A | Discharge status: Skilled Nursing Facility | Payer: Self-pay | Source: Home / Self Care | Attending: Internal Medicine

## 2017-06-02 LAB — CBC
HCT: 22.8 % — ABNORMAL LOW (ref 35.0–47.0)
Hemoglobin: 7.1 g/dL — ABNORMAL LOW (ref 12.0–16.0)
MCH: 29.9 pg (ref 26.0–34.0)
MCHC: 31.1 g/dL — AB (ref 32.0–36.0)
MCV: 96.1 fL (ref 80.0–100.0)
PLATELETS: 243 10*3/uL (ref 150–440)
RBC: 2.38 MIL/uL — ABNORMAL LOW (ref 3.80–5.20)
RDW: 20.4 % — AB (ref 11.5–14.5)
WBC: 13 10*3/uL — ABNORMAL HIGH (ref 3.6–11.0)

## 2017-06-02 LAB — HEMOGLOBIN A1C
Hgb A1c MFr Bld: <4.2 % — ABNORMAL LOW (ref 4.8–5.6)
Mean Plasma Glucose: <74 mg/dL

## 2017-06-02 LAB — BASIC METABOLIC PANEL
Anion gap: 4 — ABNORMAL LOW (ref 5–15)
BUN: 17 mg/dL (ref 6–20)
CO2: 20 mmol/L — ABNORMAL LOW (ref 22–32)
CREATININE: 0.99 mg/dL (ref 0.44–1.00)
Calcium: 8 mg/dL — ABNORMAL LOW (ref 8.9–10.3)
Chloride: 116 mmol/L — ABNORMAL HIGH (ref 101–111)
GFR calc Af Amer: >60 mL/min (ref >60)
GFR calc non Af Amer: >60 mL/min (ref >60)
Glucose, Bld: 75 mg/dL (ref 65–99)
Potassium: 3.8 mmol/L (ref 3.5–5.1)
SODIUM: 140 mmol/L (ref 135–145)

## 2017-06-02 LAB — GLUCOSE, CAPILLARY: Glucose-Capillary: 76 mg/dL (ref 65–99)

## 2017-06-02 SURGERY — IVC FILTER INSERTION
Anesthesia: Moderate Sedation

## 2017-06-02 MED ORDER — OXYCODONE HCL 5 MG PO TABS
15.0000 mg | ORAL_TABLET | Freq: Four times a day (QID) | ORAL | Status: DC | PRN
  Administered 2017-06-04 – 2017-06-05 (×2): 15 mg via ORAL
  Filled 2017-06-02 (×2): qty 3

## 2017-06-02 MED ORDER — OXYCODONE HCL 5 MG PO TABS
10.0000 mg | ORAL_TABLET | Freq: Four times a day (QID) | ORAL | Status: DC | PRN
  Administered 2017-06-02 – 2017-06-04 (×2): 10 mg via ORAL
  Filled 2017-06-02 (×3): qty 2

## 2017-06-02 MED ORDER — RISAQUAD PO CAPS
1.0000 | ORAL_CAPSULE | Freq: Every day | ORAL | Status: DC
  Administered 2017-06-02 – 2017-06-05 (×4): 1 via ORAL
  Filled 2017-06-02 (×5): qty 1

## 2017-06-02 NOTE — Progress Notes (Deleted)
PT Cancellation Note  Patient Details Name: Lindsay [name] MRN: [id number] DOB: Mar 22, 1956   Cancelled Treatment:    Reason Eval/Treat Not Completed: Other (comment). Consult received and chart reviewed. Pt currently with L LE DVT with plans for IVC filter this date. Will hold current therapy at this time and re-attempt next date.   Zimal Weisensel 06/02/2017, 11:29 AM  Greggory Stallion, PT, DPT [phone]

## 2017-06-02 NOTE — Progress Notes (Signed)
[facility] [place] NAME: Lindsay [name]    MR#:  [id number]  DATE OF BIRTH:  21-Oct-1956  SUBJECTIVE:  CHIEF COMPLAINT:   Chief Complaint  Patient presents with  . Fall  . Weakness   -Diarrhea is improving. Complaints of pain all over -Hemoglobin dropped. No active bleeding  REVIEW OF SYSTEMS:  Review of Systems  Constitutional: Positive for malaise/fatigue. Negative for chills and fever.  HENT: Negative for ear discharge, ear pain, hearing loss and nosebleeds.   Eyes: Negative for blurred vision and double vision.  Respiratory: Negative for cough, shortness of breath and wheezing.   Cardiovascular: Positive for leg swelling. Negative for chest pain and palpitations.  Gastrointestinal: Positive for diarrhea. Negative for abdominal pain, constipation, nausea and vomiting.  Genitourinary: Negative for dysuria.  Musculoskeletal: Positive for back pain, myalgias and neck pain. Negative for joint pain.  Neurological: Negative for dizziness, speech change, focal weakness, seizures and headaches.  Psychiatric/Behavioral: Negative for depression.    DRUG ALLERGIES:   Allergies  Allergen Reactions  . Shrimp [Shellfish Allergy] Anaphylaxis    VITALS:  Blood pressure 90/61, pulse 87, temperature 98.4 F (36.9 C), temperature source Oral, resp. rate 20, height 5\' 6"  (1.676 m), weight 49.9 kg (110 lb), SpO2 99 %.  PHYSICAL EXAMINATION:  Physical Exam  GENERAL:  61 y.o.-year-old Thin built and well-nourished patient lying in the bed and complains of pain all over. EYES: Pupils equal, round, reactive to light and accommodation. No scleral icterus. Extraocular muscles intact.  HEENT: Head atraumatic, normocephalic. Oropharynx and nasopharynx clear.  NECK:  Supple, no jugular venous distention. No thyroid enlargement, no tenderness.  LUNGS: Normal breath sounds bilaterally, no wheezing, rales,rhonchi or crepitation. No use of accessory  muscles of respiration.  CARDIOVASCULAR: S1, S2 normal. No murmurs, rubs, or gallops.  ABDOMEN: Soft, nontender, nondistended. Bowel sounds present. No organomegaly or mass.  EXTREMITIES: No  cyanosis, or clubbing. No edema of right leg, has 2+ edema of the left leg from foot to the thigh NEUROLOGIC: Cranial nerves II through XII are intact. Muscle strength 5/5 in all extremities. Sensation intact. Gait not checked. Overall weakness noted PSYCHIATRIC: The patient is alert and oriented x 3.  SKIN: No obvious rash, lesion, or ulcer.    LABORATORY PANEL:   CBC  Recent Labs Lab 06/02/17 0456  WBC 13.0*  HGB 7.1*  HCT 22.8*  PLT 243   ------------------------------------------------------------------------------------------------------------------  Chemistries   Recent Labs Lab 05/31/17 2359 06/02/17 0456  NA 141 140  K 4.7 3.8  CL 117* 116*  CO2 18* 20*  GLUCOSE 83 75  BUN 21* 17  CREATININE 1.22* 0.99  CALCIUM 8.5* 8.0*  AST 45*  --   ALT 27  --   ALKPHOS 235*  --   BILITOT 1.6*  --    ------------------------------------------------------------------------------------------------------------------  Cardiac Enzymes  Recent Labs Lab 05/31/17 2359  TROPONINI 0.03*   ------------------------------------------------------------------------------------------------------------------  RADIOLOGY:  Dg Chest 2 View  Result Date: 06/01/2017 CLINICAL DATA:  Left-sided pain after a fall. Multiple falls this week. EXAM: CHEST  2 VIEW COMPARISON:  03/22/2017 FINDINGS: Hyperinflation suggesting emphysema. Normal heart size and pulmonary vascularity. No focal airspace disease or consolidation in the lungs. No blunting of costophrenic angles. No pneumothorax. Mediastinal contours appear intact. Visualize ribs appear intact. Surgical clips in the right axilla and left upper quadrant. Degenerative changes in the spine. IMPRESSION: Pulmonary hyperinflation.  No evidence of active  pulmonary disease. Electronically Signed  By: Lucienne Capers M.D.   On: 06/01/2017 01:13   Ct Head Wo Contrast  Result Date: 06/01/2017 CLINICAL DATA:  [place] from bedside commode to floor tonight. EXAM: CT HEAD WITHOUT CONTRAST CT CERVICAL SPINE WITHOUT CONTRAST TECHNIQUE: Multidetector CT imaging of the head and cervical spine was performed following the standard protocol without intravenous contrast. Multiplanar CT image reconstructions of the cervical spine were also generated. COMPARISON:  01/06/2017, 03/24/2017. FINDINGS: CT HEAD FINDINGS Brain: There is no intracranial hemorrhage, mass or evidence of acute infarction. There is moderate generalized atrophy. There is moderate chronic microvascular ischemic change. There is no significant extra-axial fluid collection. No acute intracranial findings are evident. Vascular: No hyperdense vessel or unexpected calcification. Skull: Normal. Negative for fracture or focal lesion. Sinuses/Orbits: No acute finding. Other: None. CT CERVICAL SPINE FINDINGS Alignment: Normal. Skull base and vertebrae: No acute fracture. No primary bone lesion or focal pathologic process. Soft tissues and spinal canal: No prevertebral fluid or swelling. No visible canal hematoma. Disc levels: Mild cervical degenerative disc changes at C6-7. Facet articulations are well-preserved and intact. Upper chest: Negative. Other: None IMPRESSION: 1. No acute intracranial findings. There is moderate generalized atrophy and chronic appearing white matter hypodensities which likely represent small vessel ischemic disease. 2. Negative for acute cervical spine fracture. Electronically Signed   By: Andreas Newport M.D.   On: 06/01/2017 01:31   Ct Cervical Spine Wo Contrast  Result Date: 06/01/2017 CLINICAL DATA:  [place] from bedside commode to floor tonight. EXAM: CT HEAD WITHOUT CONTRAST CT CERVICAL SPINE WITHOUT CONTRAST TECHNIQUE: Multidetector CT imaging of the head and cervical spine was  performed following the standard protocol without intravenous contrast. Multiplanar CT image reconstructions of the cervical spine were also generated. COMPARISON:  01/06/2017, 03/24/2017. FINDINGS: CT HEAD FINDINGS Brain: There is no intracranial hemorrhage, mass or evidence of acute infarction. There is moderate generalized atrophy. There is moderate chronic microvascular ischemic change. There is no significant extra-axial fluid collection. No acute intracranial findings are evident. Vascular: No hyperdense vessel or unexpected calcification. Skull: Normal. Negative for fracture or focal lesion. Sinuses/Orbits: No acute finding. Other: None. CT CERVICAL SPINE FINDINGS Alignment: Normal. Skull base and vertebrae: No acute fracture. No primary bone lesion or focal pathologic process. Soft tissues and spinal canal: No prevertebral fluid or swelling. No visible canal hematoma. Disc levels: Mild cervical degenerative disc changes at C6-7. Facet articulations are well-preserved and intact. Upper chest: Negative. Other: None IMPRESSION: 1. No acute intracranial findings. There is moderate generalized atrophy and chronic appearing white matter hypodensities which likely represent small vessel ischemic disease. 2. Negative for acute cervical spine fracture. Electronically Signed   By: Andreas Newport M.D.   On: 06/01/2017 01:31   US Venous Img Lower Unilateral Left  Result Date: 06/01/2017 CLINICAL DATA:  Left leg swelling. EXAM: Left LOWER EXTREMITY VENOUS DOPPLER ULTRASOUND TECHNIQUE: Gray-scale sonography with graded compression, as well as color Doppler and duplex ultrasound were performed to evaluate the lower extremity deep venous systems from the level of the common femoral vein and including the common femoral, femoral, profunda femoral, popliteal and calf veins including the posterior tibial, peroneal and gastrocnemius veins when visible. The superficial great saphenous vein was also interrogated. Spectral  Doppler was utilized to evaluate flow at rest and with distal augmentation maneuvers in the common femoral, femoral and popliteal veins. COMPARISON:  None. FINDINGS: Contralateral Common Femoral Vein: Respiratory phasicity is normal and symmetric with the symptomatic side. No evidence of thrombus. Normal compressibility. Common Femoral  Vein: No evidence of thrombus. Normal compressibility, respiratory phasicity and response to augmentation. Saphenofemoral Junction: No evidence of thrombus. Normal compressibility and flow on color Doppler imaging. Profunda Femoral Vein: Nonocclusive thrombus is demonstrated. Incomplete compression and flow signal. Femoral Vein: Occlusive thrombus demonstrated without compression or flow signal. Popliteal Vein: Nonocclusive thrombus is demonstrated. Incomplete compression and flow signal. Calf Veins: Occlusive thrombus is demonstrated. Non compression and no flow signal identified. Superficial Great Saphenous Vein: No evidence of thrombus. Normal compressibility and flow on color Doppler imaging. Venous Reflux:  None. Other Findings:  None. IMPRESSION: Examination is positive for deep venous thrombosis involving the left femoral, popliteal, calf, and profunda femoral veins. Critical results were called by telephone at the time of interpretation on 06/01/2017 at 3:06 am to Dr. Charlesetta Ivory , who verbally acknowledged these results. Electronically Signed   By: Lucienne Capers M.D.   On: 06/01/2017 03:08   Dg Hip Unilat With Pelvis 2-3 Views Left  Result Date: 06/01/2017 CLINICAL DATA:  Left-sided pain after a fall. EXAM: DG HIP (WITH OR WITHOUT PELVIS) 2-3V LEFT COMPARISON:  None. FINDINGS: Degenerative changes in the hips. Left hip demonstrates degenerative sclerosis and joint space narrowing in the superior acetabulum with small osteophytes on both joint surfaces. No evidence of acute fracture or dislocation. No focal bone lesion or bone destruction. Pelvis appears intact. SI  joints and symphysis pubis are not displaced. IMPRESSION: Mild degenerative changes in the left hip. No acute fracture or dislocation. Electronically Signed   By: Lucienne Capers M.D.   On: 06/01/2017 01:15   US Abdomen Limited Ruq  Result Date: 06/01/2017 CLINICAL DATA:  61 y/o  F; generalized abdominal pain for 1 day. EXAM: ULTRASOUND ABDOMEN LIMITED RIGHT UPPER QUADRANT COMPARISON:  03/30/2017 CT abdomen and pelvis. FINDINGS: Gallbladder: No gallbladder wall thickening or pericholecystic fluid. Numerous shadowing gallstones. Negative sonographic Murphy's sign. Common bile duct: Diameter: 8.7 mm Liver: Normal liver echogenicity. No focal liver lesion identified. Increased echogenicity of the right kidney and atrophy of compatible with medical renal disease. IMPRESSION: 1. Numerous gallstones.  No secondary signs of acute cholecystitis. 2. Echogenic and atrophic right kidney compatible with medical renal disease. 3. Stable mildly enlarged common bile duct up to 9 mm. Electronically Signed   By: Kristine Garbe M.D.   On: 06/01/2017 03:06    EKG:   Orders placed or performed during the hospital encounter of 05/31/17  . ED EKG  . ED EKG    ASSESSMENT AND PLAN:   61 year old female with multiple medical problems including chronic systolic CHF with last EF of 45-50%, anemia of chronic disease, recent history of C. difficile colitis, history of DVT status post IVC, fibromyalgia, right hip avascular necrosis, right wrist drop, non-ischemic cardiomyopathy, malnutrition presents to hospital secondary to weakness and left leg swelling.  #1 left lower extremity acute DVT-known history of DVT in the past status post IVC filter -Now with worsening swelling of left leg. Has been at hospitals for the last 2 months and decreased mobility being the risk factor. -Was Not on anticoagulation due to history of GI bleed in the past. -Now started on eliquis. Vascular consulted, patient already has an IVC  filter. Not a candidate for thrombectomy and thrombolysis due to overall poor condition and high risk of bleeding. -Keep the left leg elevated and compression wraps if needed -Monitor hemoglobin  #2 C. difficile colitis-presented with diarrhea, C. difficile antigen and toxin positive -on oral vancomycin. Add probiotics. Improving diarrhea  #3 acute on  chronic anemia-Baseline hemoglobin around 8. Steadily dropping again to 7.1 -Transfuse if hemoglobin is less than 7 -History of GI bleed requiring transfusions in the past. -Monitor closely while on eliquis.   #4 Acute renal insufficiency-secondary to prerenal and also ATN. -Gentle hydration given. Avoid nephrotoxins.  #5 chronic hypotension-continue midodrine and florinef  #6 Chronic pain syndrome- continue pain meds   PT consulted Likely will need rehab   All the records are reviewed and case discussed with Care Management/Social Workerr. Management plans discussed with the patient, family and they are in agreement.  CODE STATUS: Full code  TOTAL TIME TAKING CARE OF THIS PATIENT: 37 minutes.   POSSIBLE D/C IN 2-3 DAYS, DEPENDING ON CLINICAL CONDITION.   Gladstone Lighter M.D on 06/02/2017 at 10:02 AM  Between 7am to 6pm - Pager - [phone]  After 6pm go to [email or web address] - password [email or web address] [facility]  Office  [phone]  CC: Primary care physician; System, Pcp Not In

## 2017-06-02 NOTE — Clinical Social Work Note (Signed)
Clinical Social Work Assessment  Patient Details  Name: Lindsay [name] MRN: [id number] Date of Birth: 1956-04-26  Date of referral:  06/02/17               Reason for consult:  Facility Placement                Permission sought to share information with:  Facility Art therapist granted to share information::  Yes, Verbal Permission Granted  Name::        Agency::     Relationship::     Contact Information:     Housing/Transportation Living arrangements for the past 2 months:  Apartment Source of Information:  Patient Patient Interpreter Needed:  None Criminal Activity/Legal Involvement Pertinent to Current Situation/Hospitalization:  No - Comment as needed Significant Relationships:  Parents Lives with:  Self Do you feel safe going back to the place where you live?  Yes Need for family participation in patient care:  No (Coment)  Care giving concerns:  Patient lives alone with frequent falls.    Social Worker assessment / plan:  CSW informed by DSS APS, Nicki Reaper, that they have an open case on patient due to falls and inability to care for self very well. Patient is alert and oriented X4 here in the hospital. CSW met with patient this morning and introduced self and explained role. Patient explained that she is ready to consider going to a facility but is only willing to go for short term rehab so she does not have to give up her apartment. She is agreeable for a bed search. CSW to complete bed search.  Employment status:  Disabled (Comment on whether or not currently receiving Disability) Insurance information:  Medicare PT Recommendations:  [place] / Referral to community resources:     Patient/Family's Response to care:  Patient expressed appreciation for CSW assistance and visit.  Patient/Family's Understanding of and Emotional Response to Diagnosis, Current Treatment, and Prognosis:  Patient appears to realize that currently it  is not best for her to return home.  Emotional Assessment Appearance:  Appears older than stated age Attitude/Demeanor/Rapport:  Lethargic Affect (typically observed):  Calm, Accepting Orientation:  Oriented to Self, Oriented to Place, Oriented to  Time, Oriented to Situation Alcohol / Substance use:  Not Applicable Psych involvement (Current and /or in the community):  No (Comment)  Discharge Needs  Concerns to be addressed:  Care Coordination Readmission within the last 30 days:  No Current discharge risk:  None Barriers to Discharge:  No Barriers Identified   Shela Leff, LCSW 06/02/2017, 11:22 AM

## 2017-06-02 NOTE — NC FL2 (Signed)
[place] LEVEL OF CARE SCREENING TOOL     IDENTIFICATION  Patient Name: Lindsay [name] Birthdate: [birth year] Sex: female Admission Date (Current Location): 05/31/2017  [place] and [place] Number:  Engineering geologist and Address:  [facility], [address] [place], [place] [address]      Provider Number: [id number]  Attending Physician Name and Address:  Gladstone Lighter, MD  Relative Name and Phone Number:       Current Level of Care: Hospital Recommended Level of Care: [place] Prior Approval Number:    Date Approved/Denied:   PASRR Number: [id number]  Discharge Plan: SNF    Current Diagnoses: Patient Active Problem List   Diagnosis Date Noted  . AKI (acute kidney injury) ([place]) 06/01/2017  . Pressure injury of skin 06/01/2017  . Failure to thrive in adult 03/30/2017  . AVN (avascular necrosis of bone) ([name]) 03/30/2017  . Generalized pain   . Severe recurrent major depression without psychotic features ([place]) 03/27/2017  . Palliative care by specialist   . Goals of care, counseling/discussion   . Hemorrhoid prolapse   . Anemia 03/23/2017  . Right arm weakness 01/05/2017  . Chest pain 05/29/2016  . Elevated troponin 05/29/2016  . Atrial flutter ([place]) 05/29/2016  . Postural hypotension   . Moderate mitral regurgitation   . PAF (paroxysmal atrial fibrillation) ([place])   . Malnutrition ([place])   . S/P gastric bypass   . Fibromyalgia   . Peripheral neuropathy   . NICM (nonischemic cardiomyopathy) ([place])   . Chronic systolic CHF (congestive heart failure) ([place])   . Recurrent major depressive disorder, in partial remission ([place])   . Major depression 08/26/2015  . Weakness of both legs   . Absolute anemia   . Pain in the chest   . Orthostatic hypotension   . Paroxysmal atrial fibrillation ([facility])   . Syncope and collapse   . Paroxysmal A-fib ([place])   . Atrial fibrillation ([place]) 05/24/2015  . Abdominal pain  05/23/2015  . C. difficile colitis 12/12/2011  . Chest pain 11/11/2011  . Syncope 11/11/2011  . Hypotension 11/11/2011  . Cardiomyopathy 11/11/2011  . AF (atrial fibrillation) ([place]) 11/11/2011    Orientation RESPIRATION BLADDER Height & Weight     Self, Time, Situation, Place  Normal Incontinent Weight: 115 lb 8 oz (52.4 kg) (bed weight) Height:  5\' 6"  (167.6 cm)  BEHAVIORAL SYMPTOMS/MOOD NEUROLOGICAL BOWEL NUTRITION STATUS   (none)  (none) Incontinent Diet (regular)  AMBULATORY STATUS COMMUNICATION OF NEEDS Skin   Extensive Assist Verbally Normal                       Personal Care Assistance Level of Assistance  Bathing, Feeding, Dressing Bathing Assistance: Maximum assistance Feeding assistance: Maximum assistance Dressing Assistance: Maximum assistance     Functional Limitations Info   (no issues)          SPECIAL CARE FACTORS FREQUENCY  PT (By licensed PT)                    Contractures Contractures Info: Not present    Additional Factors Info  Code Status, Isolation Precautions Code Status Info: full       Isolation Precautions Info: cdiff     Current Medications (06/02/2017):  This is the current hospital active medication list Current Facility-Administered Medications  Medication Dose Route Frequency Provider Last Rate Last Dose  . 0.9 %  sodium chloride infusion   Intravenous  Continuous Gladstone Lighter, MD      . acetaminophen (TYLENOL) tablet 325-650 mg  325-650 mg Oral Q6H PRN Harrie Foreman, MD       Or  . acetaminophen (TYLENOL) suppository 650 mg  650 mg Rectal Q6H PRN Harrie Foreman, MD      . acidophilus (RISAQUAD) capsule 1 capsule  1 capsule Oral Daily Gladstone Lighter, MD      . anastrozole (ARIMIDEX) tablet 1 mg  1 mg Oral Daily Harrie Foreman, MD   1 mg at 06/02/17 1042  . apixaban (ELIQUIS) tablet 10 mg  10 mg Oral BID Harrie Foreman, MD   10 mg at 06/02/17 1042  . [START ON 2017-07-08] apixaban (ELIQUIS)  tablet 5 mg  5 mg Oral BID Harrie Foreman, MD      . cholecalciferol (VITAMIN D) tablet 1,000 Units  1,000 Units Oral Daily Harrie Foreman, MD   1,000 Units at 06/02/17 1013  . clonazePAM (KLONOPIN) tablet 0.5 mg  0.5 mg Oral QHS PRN Harrie Foreman, MD   0.5 mg at 06/01/17 2137  . docusate sodium (COLACE) capsule 100 mg  100 mg Oral BID Harrie Foreman, MD   100 mg at 06/01/17 2137  . feeding supplement (ENSURE ENLIVE) (ENSURE ENLIVE) liquid 237 mL  237 mL Oral BID BM Gladstone Lighter, MD   237 mL at 06/01/17 1454  . flecainide (TAMBOCOR) tablet 50 mg  50 mg Oral Q12H Harrie Foreman, MD   50 mg at 06/02/17 1013  . fludrocortisone (FLORINEF) tablet 0.2 mg  0.2 mg Oral Daily Harrie Foreman, MD   0.2 mg at 06/02/17 1014  . fluticasone (FLONASE) 50 MCG/ACT nasal spray 1 spray  1 spray Each Nare Daily Harrie Foreman, MD   1 spray at 06/02/17 1014  . lidocaine (LIDODERM) 5 % 2 patch  2 patch Transdermal Q24H Harrie Foreman, MD   2 patch at 06/02/17 [phone]  . loperamide (IMODIUM) capsule 2 mg  2 mg Oral QID PRN Harrie Foreman, MD      . loratadine (CLARITIN) tablet 10 mg  10 mg Oral Daily PRN Harrie Foreman, MD      . midodrine (PROAMATINE) tablet 10 mg  10 mg Oral Q breakfast Harrie Foreman, MD   10 mg at 06/02/17 0836  . midodrine (PROAMATINE) tablet 5 mg  5 mg Oral BID WC Harrie Foreman, MD   5 mg at 06/01/17 1833  . mirtazapine (REMERON) tablet 30 mg  30 mg Oral QHS Harrie Foreman, MD   30 mg at 06/01/17 2137  . morphine (MS CONTIN) 12 hr tablet 15 mg  15 mg Oral Q8H Harrie Foreman, MD   15 mg at 06/02/17 7517  . ondansetron (ZOFRAN) tablet 4 mg  4 mg Oral Q6H PRN Harrie Foreman, MD       Or  . ondansetron [facility]) injection 4 mg  4 mg Intravenous Q6H PRN Harrie Foreman, MD      . oxyCODONE (Oxy IR/ROXICODONE) immediate release tablet 10 mg  10 mg Oral Q6H PRN Gladstone Lighter, MD      . oxyCODONE (Oxy IR/ROXICODONE) immediate release  tablet 15 mg  15 mg Oral Q6H PRN Gladstone Lighter, MD      . pantoprazole (PROTONIX) EC tablet 40 mg  40 mg Oral Daily Harrie Foreman, MD   40 mg at 06/02/17 1013  . potassium chloride (K-DUR,KLOR-CON) CR tablet  20 mEq  20 mEq Oral BID Harrie Foreman, MD   20 mEq at 06/02/17 1012  . pregabalin (LYRICA) capsule 150 mg  150 mg Oral BID Harrie Foreman, MD   150 mg at 06/02/17 1012  . sertraline (ZOLOFT) tablet 150 mg  150 mg Oral Daily Harrie Foreman, MD   150 mg at 06/02/17 1012  . triamcinolone cream (KENALOG) 0.1 % 1 application  1 application Topical BID Harrie Foreman, MD   1 application at 44/31/54 2146  . vancomycin (VANCOCIN) 50 mg/mL oral solution 125 mg  125 mg Oral Q6H Gladstone Lighter, MD   125 mg at 06/02/17 0641  . vitamin B-12 (CYANOCOBALAMIN) tablet 1,000 mcg  1,000 mcg Oral Daily Harrie Foreman, MD   1,000 mcg at 06/02/17 1013  . vitamin C (ASCORBIC ACID) tablet 500 mg  500 mg Oral Daily Harrie Foreman, MD   500 mg at 06/02/17 1012     Discharge Medications: Please see discharge summary for a list of discharge medications.  Relevant Imaging Results:  Relevant Lab Results:   Additional Information ss: [id number]  Shela Leff, LCSW

## 2017-06-02 NOTE — Care Management (Signed)
Notified by Malachy Mood with [facility] that patient open with RN, PT, and SW.  PT consult pending. CSW following.  Patient has open APS case

## 2017-06-02 NOTE — Progress Notes (Signed)
ANTICOAGULATION CONSULT NOTE - Initial Consult  Pharmacy Consult for apixaban dosing Indication: DVT  Allergies  Allergen Reactions  . Shrimp [Shellfish Allergy] Anaphylaxis    Patient Measurements: Height: 5\' 6"  (167.6 cm) Weight: 110 lb (49.9 kg) IBW/kg (Calculated) : 59.3 Heparin Dosing Weight: n/a  Vital Signs: Temp: 98.4 F (36.9 C) (07/27 0348) Temp Source: Oral (07/27 0348) BP: 90/61 (07/27 0836) Pulse Rate: 87 (07/27 0348)  Labs:  Recent Labs  05/31/17 2359 06/02/17 0456  HGB 7.8* 7.1*  HCT 25.2* 22.8*  PLT 243 243  CREATININE 1.22* 0.99  TROPONINI 0.03*  --     Estimated Creatinine Clearance: 48.8 mL/min (by C-G formula based on SCr of 0.99 mg/dL).   Medical History: Past Medical History:  Diagnosis Date  . Anemia of chronic disease   . C. difficile diarrhea 10/2011  . Cancer ([place])   . Chronic cough   . Chronic systolic CHF (congestive heart failure) ([place])    a. echo 05/2015: EF 45-50%, nl wall motion, mod MR, nl PASP  . Depression   . Depression   . Fibromyalgia   . History of DVT (deep vein thrombosis)    a. s/p IVC filter 2014  . HX: breast cancer    a. right-side; b. s/p lumpectomy & chemo w/ Docetaxol, cytoxan, adriamycin  . Kidney stones 2013  . Malnutrition ([place])    a. chronic diarrhea s/p most meals   . Moderate mitral regurgitation   . Muscle spasm   . NICM (nonischemic cardiomyopathy) ([place])    a. Lexiscan 08/2015: no sig ischemia, no ECG changes concerning for ischemia (baseline abnormal T wave along anterolateral leads), EF 61%, low risk scan  . Orthostatic hypotension   . PAF (paroxysmal atrial fibrillation) ([place])    a. not on long term, full-dose anticoagulation since 2011, had high INR at that time 2/2 possible malnutition, has been off anticoagulation since; b. CHADS2VASc at least 2 (CHF, female); c. 48 hr Holter 09/2015 NSR w/ rare PACs, no significant PVCs, periods of sinus tachycarida with average heart rate typically in the  60-100 range. No sig arrhythmia. No Afib seen.   . 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 Postural hypotension   . S/P gastric bypass   . Vitamin D deficiency     Medications:  No anticoagulation in PTA meds  Assessment:  Goal of Therapy:       Plan:  Apixaban 10 mg BID x 7 days and 5 mg BID thereafter ordered.  Kevia Zaucha 06/02/2017,10:12 AM

## 2017-06-02 NOTE — Progress Notes (Signed)
Initial Nutrition Assessment  DOCUMENTATION CODES:   Severe malnutrition in context of chronic illness  INTERVENTION:   Ensure Enlive po BID, each supplement provides 350 kcal and 20 grams of protein  Magic cup TID with meals, each supplement provides 290 kcal and 9 grams of protein  MVI  Snacks  NUTRITION DIAGNOSIS:   Malnutrition (severe) related to chronic illness (CHF, DVT) as evidenced by severe depletion of body fat, severe depletion of muscle mass.  GOAL:   Patient will meet greater than or equal to 90% of their needs  MONITOR:   PO intake, Supplement acceptance, Labs, Weight trends, Skin  REASON FOR ASSESSMENT:   Other (Comment) (low BMI)    ASSESSMENT:   61 year old female with multiple medical problems including chronic systolic CHF with last EF of 45-50%, anemia of chronic disease, recent history of C. difficile colitis, history of DVT status post IVC, fibromyalgia, right hip avascular necrosis, right wrist drop, non-ischemic cardiomyopathy, malnutrition presents to hospital secondary to weakness and left leg swelling.   Met with pt in room today. Pt is a poor historian but reports poor appetite today and pta. Per chart, pt appears to be weight stable since May. Pt reports that she has not eaten anything today except "pills". Pt does not normally eat breakfast but prefers lunch and dinner only. RD will order late night snack as pt usually eats late at night. Pt is willing to drink Ensure. Pt tasted Ensure while RD was in room and she liked it but prefers it ice cold. Pt is lactose intolerant; Ensure is lactose free. Pt with diarrhea and positive for C-diff. Pt needs increased protein for wound healing and to preserve lean muscle mass. Please encourage intake of meals and supplements.   Medications reviewed and include: Vit D, colace, midodrine, mirtazapine, morphine, protonix, KCl, vancomycin, Vit B-12, Vit C  Labs reviewed: Ca 8.0(L) Wbc- 13.0(H), Hgb 7.1(L), Hct  22.8(L)  Nutrition-Focused physical exam completed. Findings are severe fat and muscle depletion over entire body, and moderate edema in BLE.   Diet Order:  Diet regular Room service appropriate? Yes; Fluid consistency: Thin  Skin:  Wound (see comment) (Stage II knee)  Last BM:  7/26- diarrhea (C-diff +)  Height:   Ht Readings from Last 1 Encounters:  05/31/17 _0  (1.676 m)    Weight:   Wt Readings from Last 1 Encounters:  06/02/17 115 lb 8 oz (52.4 kg)    Ideal Body Weight:  59 kg  BMI:  Body mass index is 18.64 kg/m.  Estimated Nutritional Needs:   Kcal:  1500-1800kcal/day   Protein:  80-90g/day   Fluid:  >1.5L/day   EDUCATION NEEDS:   Education needs addressed  Koleen Distance MS, RD, LDN Pager #[phone] After Hours Pager: [phone]

## 2017-06-02 NOTE — Evaluation (Signed)
Physical Therapy Evaluation Patient Details Name: Lindsay [name] MRN: [id number] DOB: 04/29/1956 Today's Date: 06/02/2017   History of Present Illness  Pt is a 61 yo F, admitted to acute care for AKI on 7/25, with dx of L LE DVT. Prior to admission, pt needing assist from personal care aid, amb with RW. PMH: anemia, c-diff, depression, fibromyalgia, orthostatic hypotension, and peripheral neuropathy.   Clinical Impression  Pt is pleasant and willing to participate for return to PLOF. Pt performs bed mobility with maxA +1 and max verbal/visual/tactile cues, due to impaired strength, endurance, and sequencing. Tranfers and ambulation not performed due to safety concerns. Pt with reports of dizziness upon sitting, reportedly seeing double. SPT lowered pt back to supine, and pt became unresponsive to verbal cues and began crying loudly; unable to verbalize reason for response. Nursing called; pt vitals stable and calm prior to end of session. Overall, pt responded well to today's treatment with no adverse affects. Pt would benefit from skilled PT to address the previously mentioned impairments and promote return to PLOF. Currently recommending SNF, pending d/c.      Follow Up Recommendations SNF    Equipment Recommendations  None recommended by PT    Recommendations for Other Services       Precautions / Restrictions Precautions Precautions: Fall;Other (comment) Precaution Comments: L LE DVT: take precaution with mobilizing L LE Restrictions Weight Bearing Restrictions: No      Mobility  Bed Mobility Overal bed mobility: Needs Assistance Bed Mobility: Supine to Sit     Supine to sit: Max assist     General bed mobility comments: MaxA with supine to sit for physical assist to B LE's and coming to erect sitting posture. Pt with max verbal/visual/tactile cues (one-step commands). Pt responds well to cues. Pt with dizziness upon sitting, reporting seeing 4 fingers when SPT held up 2.    Transfers                 General transfer comment: Not attempted due to safety concerns.   Ambulation/Gait             General Gait Details: Not attempted due to safety concerns.   Stairs            Wheelchair Mobility    Modified Rankin (Stroke Patients Only)       Balance Overall balance assessment: Needs assistance Sitting-balance support: Bilateral upper extremity supported;Feet supported Sitting balance-Leahy Scale: Fair Sitting balance - Comments: Pt fair sitting balance, requiring min-modA, with increased postural sway to the R.                                     Pertinent Vitals/Pain Pain Assessment: No/denies pain    Home Living Family/patient expects to be discharged to:: Private residence Living Arrangements: Alone Available Help at Discharge: Neighbor;Personal care attendant Type of Home: Apartment Home Access: Level entry     Home Layout: One level Home Equipment: Walker - 4 wheels;Walker - 2 wheels;Wheelchair - Education officer, community - power;Cane - single point      Prior Function Level of Independence: Needs assistance   Gait / Transfers Assistance Needed: Primarily utilizes RW with amb.  ADL's / Homemaking Assistance Needed: Pt. has daily personal care aides to assist with ADLs.   Comments: transportation by aides     Hand Dominance        Extremity/Trunk Assessment   Upper Extremity Assessment  Upper Extremity Assessment: Generalized weakness (MMT grossly 4-/5 to B UE's)    Lower Extremity Assessment Lower Extremity Assessment: Generalized weakness (MMT to B LE's grossly 3/5)       Communication   Communication: Other (comment) (Min verbal response)  Cognition Arousal/Alertness: Lethargic Behavior During Therapy: Flat affect Overall Cognitive Status: Difficult to assess                                 General Comments: Pt lethargic throughout session, specifically after sitting at EOB  for ~30 sec. Pt with min verbal response. Does follow one step commands. Pt with one instance of crying; unable to verbalize reasoning.       General Comments      Exercises Other Exercises Other Exercises: Supine therex performed to R LE with x10 reps: ankle pumps (supervision), quad sets (supervison), glute sets (supervision), SLR (modA), and hip abd (minA). Pt required min cues for mechanics, but followed cues well.    Assessment/Plan    PT Assessment Patient needs continued PT services  PT Problem List Decreased strength;Decreased range of motion;Decreased activity tolerance;Decreased balance;Decreased mobility;Decreased coordination;Decreased knowledge of use of DME;Decreased safety awareness       PT Treatment Interventions DME instruction;Gait training;Functional mobility training;Therapeutic activities;Therapeutic exercise;Balance training;Neuromuscular re-education;Patient/family education;Manual techniques    PT Goals (Current goals can be found in the Care Plan section)  Acute Rehab PT Goals Patient Stated Goal: to get better PT Goal Formulation: With patient Time For Goal Achievement: 06/16/17 Potential to Achieve Goals: Fair    Frequency Min 2X/week   Barriers to discharge        Co-evaluation               AM-PAC PT "6 Clicks" Daily Activity  Outcome Measure Difficulty turning over in bed (including adjusting bedclothes, sheets and blankets)?: Total Difficulty moving from lying on back to sitting on the side of the bed? : Total Difficulty sitting down on and standing up from a chair with arms (e.g., wheelchair, bedside commode, etc,.)?: Total Help needed moving to and from a bed to chair (including a wheelchair)?: A Lot Help needed walking in hospital room?: Total Help needed climbing 3-5 steps with a railing? : Total 6 Click Score: 7    End of Session Equipment Utilized During Treatment: Gait belt Activity Tolerance: Other (comment) (Pt became  emotional) Patient left: in bed;with call bell/phone within reach;with bed alarm set Nurse Communication: Mobility status PT Visit Diagnosis: Unsteadiness on feet (R26.81);Other abnormalities of gait and mobility (R26.89);Repeated falls (R29.6);Muscle weakness (generalized) (M62.81);History of falling (Z91.81)    Time: 1445-1510 PT Time Calculation (min) (ACUTE ONLY): 25 min   Charges:         PT G Codes:        Oran Rein PT, SPT   Bevelyn Ngo 06/02/2017, 3:49 PM

## 2017-06-03 DIAGNOSIS — E43 Unspecified severe protein-calorie malnutrition: Secondary | ICD-10-CM | POA: Insufficient documentation

## 2017-06-03 LAB — CBC
HCT: 23.2 % — ABNORMAL LOW (ref 35.0–47.0)
HEMOGLOBIN: 7.1 g/dL — AB (ref 12.0–16.0)
MCH: 29.9 pg (ref 26.0–34.0)
MCHC: 30.8 g/dL — AB (ref 32.0–36.0)
MCV: 97 fL (ref 80.0–100.0)
PLATELETS: 255 10*3/uL (ref 150–440)
RBC: 2.39 MIL/uL — AB (ref 3.80–5.20)
RDW: 20.9 % — ABNORMAL HIGH (ref 11.5–14.5)
WBC: 14.5 10*3/uL — ABNORMAL HIGH (ref 3.6–11.0)

## 2017-06-03 NOTE — Clinical Social Work Note (Signed)
CSW visited patient at bedside to discuss discharge planning and to give the patient bed offers. The patient has accepted the bed offer from [facility] in [place]. The CSW reiterated to the patient that his is a STR not LTC, and the patient became tearful and thanked the CSW after says: "I was so afraid you all were trying to get rid of me forever.". The CSW provided emotional support for the patient. CSW will continue to follow.  Santiago Bumpers, MSW, Latanya Presser [phone]

## 2017-06-03 NOTE — Progress Notes (Signed)
[facility] [place] NAME: Lindsay [name]    MR#:  [id number]  DATE OF BIRTH:  09-13-1956  SUBJECTIVE:  CHIEF COMPLAINT:   Chief Complaint  Patient presents with  . Fall  . Weakness   -Hemoglobin stable at 7.1 while on anticoagulation. -Diarrhea is better today. Complaints of pain all over  REVIEW OF SYSTEMS:  Review of Systems  Constitutional: Positive for malaise/fatigue. Negative for chills and fever.  HENT: Negative for ear discharge, ear pain, hearing loss and nosebleeds.   Eyes: Negative for blurred vision and double vision.  Respiratory: Negative for cough, shortness of breath and wheezing.   Cardiovascular: Positive for leg swelling. Negative for chest pain and palpitations.  Gastrointestinal: Positive for diarrhea. Negative for abdominal pain, constipation, nausea and vomiting.  Genitourinary: Negative for dysuria.  Musculoskeletal: Positive for back pain, myalgias and neck pain. Negative for joint pain.  Neurological: Negative for dizziness, speech change, focal weakness, seizures and headaches.  Psychiatric/Behavioral: Negative for depression.    DRUG ALLERGIES:   Allergies  Allergen Reactions  . Shrimp [Shellfish Allergy] Anaphylaxis    VITALS:  Blood pressure (!) 87/57, pulse 88, temperature 98.8 F (37.1 C), temperature source Oral, resp. rate 16, height 5\' 6"  (1.676 m), weight 52.2 kg (115 lb 1.6 oz), SpO2 100 %.  PHYSICAL EXAMINATION:  Physical Exam  GENERAL:  61 y.o.-year-old Thin built and well-nourished patient lying in the bed and complains of pain all over. EYES: Pupils equal, round, reactive to light and accommodation. No scleral icterus. Extraocular muscles intact.  HEENT: Head atraumatic, normocephalic. Oropharynx and nasopharynx clear.  NECK:  Supple, no jugular venous distention. No thyroid enlargement, no tenderness.  LUNGS: Normal breath sounds bilaterally, no wheezing, rales,rhonchi or  crepitation. No use of accessory muscles of respiration.  CARDIOVASCULAR: S1, S2 normal. No murmurs, rubs, or gallops.  ABDOMEN: Soft, nontender, nondistended. Bowel sounds present. No organomegaly or mass.  EXTREMITIES: No  cyanosis, or clubbing. No edema of right leg, has 2+ edema of the left leg from foot to the thigh NEUROLOGIC: Cranial nerves II through XII are intact. Muscle strength 5/5 in all extremities. Sensation intact. Gait not checked. Overall weakness noted PSYCHIATRIC: The patient is alert and oriented x 3.  SKIN: No obvious rash, lesion, or ulcer.    LABORATORY PANEL:   CBC  Recent Labs Lab 06/03/17 0355  WBC 14.5*  HGB 7.1*  HCT 23.2*  PLT 255   ------------------------------------------------------------------------------------------------------------------  Chemistries   Recent Labs Lab 05/31/17 2359 06/02/17 0456  NA 141 140  K 4.7 3.8  CL 117* 116*  CO2 18* 20*  GLUCOSE 83 75  BUN 21* 17  CREATININE 1.22* 0.99  CALCIUM 8.5* 8.0*  AST 45*  --   ALT 27  --   ALKPHOS 235*  --   BILITOT 1.6*  --    ------------------------------------------------------------------------------------------------------------------  Cardiac Enzymes  Recent Labs Lab 05/31/17 2359  TROPONINI 0.03*   ------------------------------------------------------------------------------------------------------------------  RADIOLOGY:  No results found.  EKG:   Orders placed or performed during the hospital encounter of 05/31/17  . ED EKG  . ED EKG    ASSESSMENT AND PLAN:   61 year old female with multiple medical problems including chronic systolic CHF with last EF of 45-50%, anemia of chronic disease, recent history of C. difficile colitis, history of DVT status post IVC, fibromyalgia, right hip avascular necrosis, right wrist drop, non-ischemic cardiomyopathy, malnutrition presents to hospital secondary to weakness and left leg swelling.  #  1 left lower extremity  acute DVT-known history of DVT in the past status post IVC filter and now with acute left leg DVT again -Now with worsening swelling of left leg. Has been at hospitals for the last 2 months and decreased mobility being the risk factor. -Was Not on anticoagulation due to history of GI bleed in the past. -Now started on eliquis. Vascular consulted, patient already has an IVC filter. Not a candidate for thrombectomy and thrombolysis due to overall poor condition and high risk of bleeding. -Keep the left leg elevated and compression wraps if needed -Monitor hemoglobin  #2 C. difficile colitis-presented with diarrhea, C. difficile antigen and toxin positive -on oral vancomycin. Add probiotics. Improving diarrhea  #3 acute on chronic anemia-Baseline hemoglobin around 8. Steadily dropping again to 7.1 -Transfuse if hemoglobin is less than 7 -History of GI bleed requiring transfusions in the past. -Monitor closely while on eliquis.   #4 Acute renal insufficiency-secondary to prerenal and also ATN. -Gentle hydration given. Avoid nephrotoxins.  #5 chronic hypotension-continue midodrine and florinef  #6 Chronic pain syndrome- continue pain meds   PT consulted will need rehab   All the records are reviewed and case discussed with Care Management/Social Workerr. Management plans discussed with the patient, family and they are in agreement.  CODE STATUS: Full code  TOTAL TIME TAKING CARE OF THIS PATIENT: 37 minutes.   POSSIBLE D/C Monday, DEPENDING ON CLINICAL CONDITION.   Gladstone Lighter M.D on 06/03/2017 at 11:12 AM  Between 7am to 6pm - Pager - [phone]  After 6pm go to [email or web address] - password [email or web address] [facility]  Office  [phone]  CC: Primary care physician; System, Pcp Not In

## 2017-06-04 LAB — CBC
HCT: 22.5 % — ABNORMAL LOW (ref 35.0–47.0)
HEMOGLOBIN: 7 g/dL — AB (ref 12.0–16.0)
MCH: 30.9 pg (ref 26.0–34.0)
MCHC: 31.3 g/dL — AB (ref 32.0–36.0)
MCV: 98.7 fL (ref 80.0–100.0)
Platelets: 265 10*3/uL (ref 150–440)
RBC: 2.28 MIL/uL — AB (ref 3.80–5.20)
RDW: 21.3 % — ABNORMAL HIGH (ref 11.5–14.5)
WBC: 13.1 10*3/uL — ABNORMAL HIGH (ref 3.6–11.0)

## 2017-06-04 NOTE — Progress Notes (Signed)
Was unable to restart IV. Dr. Tressia Miners was notified and gave order to leave out IV over night. Patient has been up to the chair this shift. Her appetite has improved and has been tolerating diet. No nausea or vomiting. One soft, brown stool was produced today.

## 2017-06-04 NOTE — Progress Notes (Signed)
[name] at [place] NAME: Lindsay [name]    MR#:  [id number]  DATE OF BIRTH:  1955/12/15  SUBJECTIVE:  CHIEF COMPLAINT:   Chief Complaint  Patient presents with  . Fall  . Weakness   -Hemoglobin stable at 7, Denies any complaints.  REVIEW OF SYSTEMS:  Review of Systems  Constitutional: Positive for malaise/fatigue. Negative for chills and fever.  HENT: Negative for ear discharge, ear pain, hearing loss and nosebleeds.   Eyes: Negative for blurred vision and double vision.  Respiratory: Negative for cough, shortness of breath and wheezing.   Cardiovascular: Positive for leg swelling. Negative for chest pain and palpitations.  Gastrointestinal: Positive for diarrhea. Negative for abdominal pain, constipation, nausea and vomiting.  Genitourinary: Negative for dysuria.  Musculoskeletal: Positive for back pain, myalgias and neck pain. Negative for joint pain.  Neurological: Negative for dizziness, speech change, focal weakness, seizures and headaches.  Psychiatric/Behavioral: Negative for depression.    DRUG ALLERGIES:   Allergies  Allergen Reactions  . Shrimp [Shellfish Allergy] Anaphylaxis    VITALS:  Blood pressure 110/70, pulse 89, temperature 98.3 F (36.8 C), temperature source Oral, resp. rate 18, height 5\' 6"  (1.676 m), weight 53.8 kg (118 lb 8 oz), SpO2 100 %.  PHYSICAL EXAMINATION:  Physical Exam  GENERAL:  61 y.o.-year-old Thin built and well-nourished patient lying in the bed and complains of pain all over. EYES: Pupils equal, round, reactive to light and accommodation. No scleral icterus. Extraocular muscles intact.  HEENT: Head atraumatic, normocephalic. Oropharynx and nasopharynx clear.  NECK:  Supple, no jugular venous distention. No thyroid enlargement, no tenderness.  LUNGS: Normal breath sounds bilaterally, no wheezing, rales,rhonchi or crepitation. No use of accessory muscles of respiration.  CARDIOVASCULAR:  S1, S2 normal. No murmurs, rubs, or gallops.  ABDOMEN: Soft, nontender, nondistended. Bowel sounds present. No organomegaly or mass.  EXTREMITIES: No  cyanosis, or clubbing. No edema of right leg, has 2+ edema of the left leg from foot to the thigh NEUROLOGIC: Cranial nerves II through XII are intact. Muscle strength 5/5 in all extremities. Sensation intact. Gait not checked. Overall weakness noted PSYCHIATRIC: The patient is alert and oriented x 3.  SKIN: No obvious rash, lesion, or ulcer.    LABORATORY PANEL:   CBC  Recent Labs Lab 06/04/17 0322  WBC 13.1*  HGB 7.0*  HCT 22.5*  PLT 265   ------------------------------------------------------------------------------------------------------------------  Chemistries   Recent Labs Lab 05/31/17 2359 06/02/17 0456  NA 141 140  K 4.7 3.8  CL 117* 116*  CO2 18* 20*  GLUCOSE 83 75  BUN 21* 17  CREATININE 1.22* 0.99  CALCIUM 8.5* 8.0*  AST 45*  --   ALT 27  --   ALKPHOS 235*  --   BILITOT 1.6*  --    ------------------------------------------------------------------------------------------------------------------  Cardiac Enzymes  Recent Labs Lab 05/31/17 2359  TROPONINI 0.03*   ------------------------------------------------------------------------------------------------------------------  RADIOLOGY:  No results found.  EKG:   Orders placed or performed during the hospital encounter of 05/31/17  . ED EKG  . ED EKG    ASSESSMENT AND PLAN:   61 year old female with multiple medical problems including chronic systolic CHF with last EF of 45-50%, anemia of chronic disease, recent history of C. difficile colitis, history of DVT status post IVC, fibromyalgia, right hip avascular necrosis, right wrist drop, non-ischemic cardiomyopathy, malnutrition presents to hospital secondary to weakness and left leg swelling.  #1 left lower extremity acute DVT-known history of DVT in  the past status post IVC filter and now  with acute left leg DVT again -Now with worsening swelling of left leg. Has been at hospitals for the last 2 months and decreased mobility being the risk factor. -Was Not on anticoagulation due to history of GI bleed in the past. -Now started on eliquis. Vascular consulted, patient already has an IVC filter. Not a candidate for thrombectomy and thrombolysis due to overall poor condition and high risk of bleeding. -Keep the left leg elevated and compression wraps if needed -Monitor hemoglobin  #2 C. difficile colitis-presented with diarrhea, C. difficile antigen and toxin positive -on oral vancomycin. Add probiotics. Improving diarrhea  #3 acute on chronic anemia-Baseline hemoglobin around 8. Steadily dropping to 7 -Transfuse if hemoglobin is less than 7 -History of GI bleed requiring transfusions in the past. -Monitor closely while on eliquis.   #4 Acute renal insufficiency-secondary to prerenal and also ATN. -Gentle hydration given. Avoid nephrotoxins.  #5 chronic hypotension-continue midodrine and florinef  #6 Chronic pain syndrome- continue pain meds   PT consulted will need rehab   All the records are reviewed and case discussed with Care Management/Social Workerr. Management plans discussed with the patient, family and they are in agreement.  CODE STATUS: Full code  TOTAL TIME TAKING CARE OF THIS PATIENT: 37 minutes.   POSSIBLE D/C Monday, DEPENDING ON CLINICAL CONDITION.   [name] M.D on 06/04/2017 at 12:44 PM  Between 7am to 6pm - Pager - [phone]  After 6pm go to [email or web address] - password [email or web address] [facility]  Office  [phone]  CC: Primary care physician; System, Pcp Not In

## 2017-06-05 LAB — BPAM RBC
Blood Product Expiration Date: 201808242359
UNIT TYPE AND RH: 5100

## 2017-06-05 LAB — CREATININE, SERUM
CREATININE: 1.11 mg/dL — AB (ref 0.44–1.00)
GFR calc Af Amer: >60 mL/min (ref >60)
GFR calc non Af Amer: 54 mL/min — ABNORMAL LOW (ref >60)

## 2017-06-05 LAB — TYPE AND SCREEN
ABO/RH(D): O POS
Antibody Screen: NEGATIVE
Unit division: 0

## 2017-06-05 LAB — CBC
HCT: 21.5 % — ABNORMAL LOW (ref 35.0–47.0)
Hemoglobin: 6.9 g/dL — ABNORMAL LOW (ref 12.0–16.0)
MCH: 32 pg (ref 26.0–34.0)
MCHC: 32 g/dL (ref 32.0–36.0)
MCV: 99.9 fL (ref 80.0–100.0)
PLATELETS: 251 10*3/uL (ref 150–440)
RBC: 2.15 MIL/uL — AB (ref 3.80–5.20)
RDW: 20.1 % — ABNORMAL HIGH (ref 11.5–14.5)
WBC: 15 10*3/uL — AB (ref 3.6–11.0)

## 2017-06-05 LAB — PREPARE RBC (CROSSMATCH)

## 2017-06-05 MED ORDER — APIXABAN 5 MG PO TABS: 5.0000 mg | ORAL_TABLET | Freq: Two times a day (BID) | ORAL | 2 refills | Status: AC

## 2017-06-05 MED ORDER — ENSURE ENLIVE PO LIQD: 237.0000 mL | Freq: Two times a day (BID) | ORAL | 12 refills | Status: AC

## 2017-06-05 MED ORDER — VANCOMYCIN 50 MG/ML ORAL SOLUTION: 125.0000 mg | Freq: Four times a day (QID) | ORAL | 0 refills | Status: AC

## 2017-06-05 MED ORDER — MORPHINE SULFATE ER 15 MG PO TBCR: 15.0000 mg | EXTENDED_RELEASE_TABLET | Freq: Three times a day (TID) | ORAL | 0 refills | Status: AC

## 2017-06-05 MED ORDER — SODIUM CHLORIDE 0.9 % IV SOLN: Freq: Once | INTRAVENOUS | Status: DC

## 2017-06-05 MED ORDER — OXYCODONE HCL 15 MG PO TABS: 15.0000 mg | ORAL_TABLET | Freq: Four times a day (QID) | ORAL | 0 refills | Status: AC | PRN

## 2017-06-05 MED ORDER — CLONAZEPAM 0.5 MG PO TABS: 0.5000 mg | ORAL_TABLET | Freq: Two times a day (BID) | ORAL | 0 refills | Status: AC | PRN

## 2017-06-05 MED ORDER — MIDODRINE HCL 10 MG PO TABS: 5.0000 mg | ORAL_TABLET | Freq: Three times a day (TID) | ORAL | 2 refills | Status: AC

## 2017-06-05 MED ORDER — APIXABAN 5 MG PO TABS: 10.0000 mg | ORAL_TABLET | Freq: Two times a day (BID) | ORAL | 0 refills | Status: AC

## 2017-06-05 MED ORDER — RISAQUAD PO CAPS: 1.0000 | ORAL_CAPSULE | Freq: Every day | ORAL | 0 refills | Status: AC

## 2017-06-05 NOTE — Discharge Summary (Signed)
Lindsay [name] at [facility] NAME: Lindsay [name]    MR#:  [id number]  DATE OF BIRTH:  07-06-1956  DATE OF ADMISSION:  05/31/2017   ADMITTING PHYSICIAN: Harrie Foreman, MD  DATE OF DISCHARGE: 06/05/17  PRIMARY CARE PHYSICIAN: System, Pcp Not In   ADMISSION DIAGNOSIS:   Hip pain [M25.559] Abdominal pain [R10.9] Fall, initial encounter [W19.XXXA] Acute deep vein thrombosis (DVT) of femoral vein of left lower extremity (HCC) [I82.412] Anemia, unspecified type [D64.9]  DISCHARGE DIAGNOSIS:   Active Problems:   AKI (acute kidney injury) ([place])   Pressure injury of skin   Protein-calorie malnutrition, severe   SECONDARY DIAGNOSIS:   Past Medical History:  Diagnosis Date  . Anemia of chronic disease   . C. difficile diarrhea 10/2011  . Cancer ([place])   . Chronic cough   . Chronic systolic CHF (congestive heart failure) ([place])    a. echo 05/2015: EF 45-50%, nl wall motion, mod MR, nl PASP  . Depression   . Depression   . Fibromyalgia   . History of DVT (deep vein thrombosis)    a. s/p IVC filter 2014  . HX: breast cancer    a. right-side; b. s/p lumpectomy & chemo w/ Docetaxol, cytoxan, adriamycin  . Kidney stones 2013  . Malnutrition ([place])    a. chronic diarrhea s/p most meals   . Moderate mitral regurgitation   . Muscle spasm   . NICM (nonischemic cardiomyopathy) ([place])    a. Lexiscan 08/2015: no sig ischemia, no ECG changes concerning for ischemia (baseline abnormal T wave along anterolateral leads), EF 61%, low risk scan  . Orthostatic hypotension   . PAF (paroxysmal atrial fibrillation) ([place])    a. not on long term, full-dose anticoagulation since 2011, had high INR at that time 2/2 possible malnutition, has been off anticoagulation since; b. CHADS2VASc at least 2 (CHF, female); c. 48 hr Holter 09/2015 NSR w/ rare PACs, no significant PVCs, periods of sinus tachycarida with average heart rate typically in the 60-100 range. No sig  arrhythmia. No Afib seen.   . 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 Postural hypotension   . S/P gastric bypass   . Vitamin D deficiency     HOSPITAL COURSE:   61 year old female with multiple medical problems including chronic systolic CHF with last EF of 45-50%, anemia of chronic disease, recent history of C. difficile colitis, history of DVT status post IVC, fibromyalgia, right hip avascular necrosis, right wrist drop, non-ischemic cardiomyopathy, malnutrition presents to hospital secondary to weakness and left leg swelling.  #1 left lower extremity acute DVT- known history of DVT in the past status post IVC filter and now with acute left leg DVT again -Now with worsening swelling of left leg. Has been at hospitals for the last 2 months and decreased mobility being the risk factor. -Was Not on anticoagulation due to history of GI bleed in the past. -Now started on eliquis. Vascular consulted, patient already has an IVC filter. Not a candidate for thrombectomy and thrombolysis due to overall poor condition and high risk of bleeding. -Keep the left leg elevated and compression wraps if needed -Monitor hemoglobin  #2 C. difficile colitis-presented with diarrhea, C. difficile antigen and toxin positive -on oral vancomycin. Added probiotics. Improving diarrhea  #3 acute on chronic anemia-Baseline hemoglobin around 7- 8. Steadily dropping and at 6.9 - No active symptoms. Refused blood transfusion - hematology f/u and recheck hb in 1  week. -Transfuse if hemoglobin is less than 6.5 or symptomatic -no active bleeding -Monitor closely while on eliquis.   #4 Acute renal insufficiency-secondary to prerenal and also ATN. -Gentle hydration given. Avoid nephrotoxins.  #5 chronic hypotension-continue midodrine and florinef  #6 Chronic pain syndrome- continue pain meds   PT consulted needs rehab. Possible discharge today  DISCHARGE CONDITIONS:   Guarded  CONSULTS  OBTAINED:   Treatment Team:  Algernon Huxley, MD  DRUG ALLERGIES:   Allergies  Allergen Reactions  . Shrimp [Shellfish Allergy] Anaphylaxis   DISCHARGE MEDICATIONS:   Allergies as of 06/05/2017      Reactions   Shrimp [shellfish Allergy] Anaphylaxis      Medication List    TAKE these medications   acidophilus Caps capsule Take 1 capsule by mouth daily.   anastrozole 1 MG tablet Commonly known as:  ARIMIDEX Take 1 mg by mouth daily.   apixaban 5 MG Tabs tablet Commonly known as:  ELIQUIS Take 2 tablets (10 mg total) by mouth 2 (two) times daily.   apixaban 5 MG Tabs tablet Commonly known as:  ELIQUIS Take 1 tablet (5 mg total) by mouth 2 (two) times daily. Please start after finishing the first week of 2 tablets twice a dose- that is from 06/09/17 Start taking on:  06/09/2017   clonazePAM 0.5 MG tablet Commonly known as:  KLONOPIN Take 1 tablet (0.5 mg total) by mouth 2 (two) times daily as needed for anxiety. What changed:  when to take this   feeding supplement (ENSURE ENLIVE) Liqd Take 237 mLs by mouth 2 (two) times daily between meals.   flecainide 100 MG tablet Commonly known as:  TAMBOCOR Take 0.5 tablets (50 mg total) by mouth every 12 (twelve) hours.   fludrocortisone 0.1 MG tablet Commonly known as:  FLORINEF Take 0.2 mg by mouth daily.   fluticasone 50 MCG/ACT nasal spray Commonly known as:  FLONASE Place 1 spray into the nose daily.   lidocaine 5 % Commonly known as:  LIDODERM Place 1-3 patches onto the skin daily. Remove & Discard patch within 12 hours or as directed by MD   loperamide 2 MG capsule Commonly known as:  IMODIUM Take 2 mg by mouth 4 (four) times daily as needed for diarrhea or loose stools.   loratadine 10 MG tablet Commonly known as:  CLARITIN Take 10 mg by mouth daily as needed for allergies.   midodrine 10 MG tablet Commonly known as:  PROAMATINE Take 0.5-1 tablets (5-10 mg total) by mouth 3 (three) times daily with meals.  Take 10 mg in the morning and 5 mg at noon and supper What changed:  how much to take  additional instructions   mirtazapine 30 MG tablet Commonly known as:  REMERON Take 1 tablet (30 mg total) by mouth at bedtime. What changed:  how much to take   morphine 15 MG 12 hr tablet Commonly known as:  MS CONTIN Take 1 tablet (15 mg total) by mouth every 8 (eight) hours.   omeprazole 40 MG capsule Commonly known as:  PRILOSEC Take 1 capsule by mouth daily.   oxyCODONE 15 MG immediate release tablet Commonly known as:  ROXICODONE Take 1 tablet (15 mg total) by mouth every 6 (six) hours as needed for pain (moderate and severe). What changed:  reasons to take this   potassium chloride 10 MEQ tablet Commonly known as:  K-DUR Take 20 mEq by mouth 2 (two) times daily.   pregabalin 150 MG capsule Commonly  known as:  LYRICA Take 1 capsule (150 mg total) by mouth 2 (two) times daily.   sertraline 100 MG tablet Commonly known as:  ZOLOFT Take 150 mg by mouth daily.   triamcinolone cream 0.1 % Commonly known as:  KENALOG Apply 1 application topically 2 (two) times daily.   vancomycin 50 mg/mL oral solution Commonly known as:  VANCOCIN Take 2.5 mLs (125 mg total) by mouth every 6 (six) hours. X 10 days   vitamin B-12 1000 MCG tablet Commonly known as:  CYANOCOBALAMIN Take 1,000 mcg by mouth daily.   vitamin C 500 MG tablet Commonly known as:  ASCORBIC ACID Take 500 mg by mouth daily.   Vitamin D3 1000 units Caps Take 1,000 Units by mouth daily.        DISCHARGE INSTRUCTIONS:   1. Hematology f/u in 1 week 2. PCP f/u in 1 week 3. CBC check in 3-4 days 4. Palliative care follow up  DIET:   Regular diet  ACTIVITY:   Activity as tolerated  OXYGEN:   Home Oxygen: Yes.    Oxygen Delivery: room air  DISCHARGE LOCATION:   nursing home   If you experience worsening of your admission symptoms, develop shortness of breath, life threatening emergency, suicidal or  homicidal thoughts you must seek medical attention immediately by calling 911 or calling your MD immediately  if symptoms less severe.  You Must read complete instructions/literature along with all the possible adverse reactions/side effects for all the Medicines you take and that have been prescribed to you. Take any new Medicines after you have completely understood and accpet all the possible adverse reactions/side effects.   Please note  You were cared for by a hospitalist during your hospital stay. If you have any questions about your discharge medications or the care you received while you were in the hospital after you are discharged, you can call the unit and asked to speak with the hospitalist on call if the hospitalist that took care of you is not available. Once you are discharged, your primary care physician will handle any further medical issues. Please note that NO REFILLS for any discharge medications will be authorized once you are discharged, as it is imperative that you return to your primary care physician (or establish a relationship with a primary care physician if you do not have one) for your aftercare needs so that they can reassess your need for medications and monitor your lab values.    On the day of Discharge:  VITAL SIGNS:   Blood pressure 99/62, pulse 96, temperature 99.3 F (37.4 C), temperature source Oral, resp. rate 20, height 5\' 6"  (1.676 m), weight 53.8 kg (118 lb 8 oz), SpO2 100 %.  PHYSICAL EXAMINATION:    GENERAL:  61 y.o.-year-old patient lying in the bed with no acute distress.  EYES: Pupils equal, round, reactive to light and accommodation. No scleral icterus. Extraocular muscles intact.  HEENT: Head atraumatic, normocephalic. Oropharynx and nasopharynx clear.  NECK:  Supple, no jugular venous distention. No thyroid enlargement, no tenderness.  LUNGS: Normal breath sounds bilaterally, no wheezing, rales,rhonchi or crepitation. No use of accessory  muscles of respiration.  CARDIOVASCULAR: S1, S2 normal. No murmurs, rubs, or gallops.  ABDOMEN: Soft, non-tender, non-distended. Bowel sounds present. No organomegaly or mass.  EXTREMITIES: No pedal edema, cyanosis, or clubbing.  NEUROLOGIC: Cranial nerves II through XII are intact. Muscle strength 5/5 in all extremities. Sensation intact. Gait not checked.  PSYCHIATRIC: The patient is alert and oriented  x 3. Mood changes significantly, unhappy today SKIN: No obvious rash, lesion, or ulcer.   DATA REVIEW:   CBC  Recent Labs Lab 06/05/17 0401  WBC 15.0*  HGB 6.9*  HCT 21.5*  PLT 251    Chemistries   Recent Labs Lab 05/31/17 2359 06/02/17 0456 06/05/17 0401  NA 141 140  --   K 4.7 3.8  --   CL 117* 116*  --   CO2 18* 20*  --   GLUCOSE 83 75  --   BUN 21* 17  --   CREATININE 1.22* 0.99 1.11*  CALCIUM 8.5* 8.0*  --   AST 45*  --   --   ALT 27  --   --   ALKPHOS 235*  --   --   BILITOT 1.6*  --   --      Microbiology Results  Results for orders placed or performed during the hospital encounter of 05/31/17  C difficile quick scan w PCR reflex     Status: Abnormal   Collection Time: 06/01/17 11:20 AM  Result Value Ref Range Status   C Diff antigen POSITIVE (A) NEGATIVE Final   C Diff toxin NEGATIVE NEGATIVE Final   C Diff interpretation Results are indeterminate. See PCR results.  Final  Clostridium Difficile by PCR     Status: Abnormal   Collection Time: 06/01/17 11:20 AM  Result Value Ref Range Status   Toxigenic C Difficile by pcr POSITIVE (A) NEGATIVE Final    Comment: Positive for toxigenic C. difficile with little to no toxin production. Only treat if clinical presentation suggests symptomatic illness.    RADIOLOGY:  No results found.   Management plans discussed with the patient, family and they are in agreement.  CODE STATUS:     Code Status Orders        Start     Ordered   06/01/17 0635  Full code  Continuous     06/01/17 0635    Code  Status History    Date Active Date Inactive Code Status Order ID Comments User Context   03/22/2017  4:12 PM 04/02/2017  1:37 PM Full Code [id number]  Epifanio Lesches, MD ED   01/05/2017  4:48 PM 01/06/2017  9:37 PM Full Code [id number]  Demetrios Loll, MD Inpatient   05/29/2016  4:24 PM 05/30/2016  8:01 AM Full Code [id number]  Idelle Crouch, MD Inpatient   08/23/2015  4:14 PM 08/27/2015  7:54 PM Full Code [id number]  Fritzi Mandes, MD ED   05/23/2015 11:00 AM 05/26/2015  3:23 PM Full Code [id number]  Henreitta Leber, MD Inpatient      TOTAL TIME TAKING CARE OF THIS PATIENT: 38 minutes.    Fatemah Pourciau M.D on 06/05/2017 at 12:19 PM  Between 7am to 6pm - Pager - [phone]  After 6pm go to [email or web address] - Proofreader  [facility]  Office  [phone]  CC: Primary care physician; System, Pcp Not In   Note: This dictation was prepared with Dragon dictation along with smaller phrase technology. Any transcriptional errors that result from this process are unintentional.

## 2017-06-05 NOTE — Progress Notes (Signed)
Pt prepared for d/c to SNF. IV d/c'd. Skin intact except as charted in most recent assessments. Vitals are stable. Report called to receiving facility. Pt to be transported by ambulance service.  Eann Cleland  

## 2017-06-05 NOTE — Progress Notes (Signed)
Hemoglobin is at 6.9 today. Very steady drop. Close to her baseline. -Recommended 1 unit transfusion prior to discharge, however patient has refused at this time. -We'll start an iron and have outpatient hematology follow-up. -Explained the risks of being on anticoagulation and having anemia. Patient aware and still refuses transfusion

## 2017-06-05 NOTE — Care Management Important Message (Signed)
Important Message  Patient Details  Name: Lindsay [name] MRN: [id number] Date of Birth: Jul 30, 1956   Medicare Important Message Given:  Yes    Beverly Sessions, RN 06/05/2017, 12:41 PM

## 2017-06-05 NOTE — Clinical Social Work Note (Signed)
Physician discharging patient today to [facility]. Discharge information sent. Nurse to call report then EMS. CSW notified Scott at [facility] of discharge. CSW asked patient if there was anyone she wanted me to call to notify them about her discharge and she did not want anyone contacted. Shela Leff MSW,LCSW [phone]

## 2017-06-05 NOTE — Progress Notes (Signed)
Primary nurse noted that pt HGB  was 6.9. Primary nurse notified Dr. Marcille Blanco of results.Pt  VSS; Pt asymptomatric No new orders. Day shift MD to assess. Primary RN to continue to monitor pt

## 2017-06-05 NOTE — Progress Notes (Signed)
Physical Therapy Treatment Patient Details Name: Lindsay [name] MRN: [id number] DOB: 01/30/1956 Today's Date: 06/05/2017    History of Present Illness Pt is a 61 yo F, admitted to acute care for AKI on 7/25, with dx of L LE DVT. Prior to admission, pt needing assist from personal care aid, amb with RW. PMH: anemia, c-diff, depression, fibromyalgia, orthostatic hypotension, and peripheral neuropathy.     PT Comments    Pt highly lethargic throughout session, and becoming increasingly agitated. Pt performs bed mobility with MinA and max cues. Pt required CGA-MinA to maintain sitting balance, with noted postural sway in all directions and poor postural reactions to correct. Tranfers and ambulation not performed, as pt refused task. Pt presents with the following deficits: strength, balance, endurance, and sequencing. Pt is progressing towards functional goals, as seen by reduced assistance required for bed mobility. Pt would benefit from skilled PT to address the previously mentioned impairments and promote return to PLOF. Currently recommending SNF, pending d/c.     Follow Up Recommendations  SNF     Equipment Recommendations  None recommended by PT    Recommendations for Other Services       Precautions / Restrictions Precautions Precautions: Fall;Other (comment) Precaution Comments: L LE DVT: take precaution with mobilizing L LE Restrictions Weight Bearing Restrictions: No    Mobility  Bed Mobility Overal bed mobility: Needs Assistance Bed Mobility: Supine to Sit     Supine to sit: Min assist     General bed mobility comments: MinA with Max verbal/visual/tactile cuesfor mechanics, hand placement, and safety. Pt demo much improvement, but requires increased time to perform task with [facility] elevated and use of R hand rail. No reports of dizziness upon sitting.   Transfers                 General transfer comment: Not performed as pt refused.   Ambulation/Gait              General Gait Details: Not performed as pt refused.    Stairs            Wheelchair Mobility    Modified Rankin (Stroke Patients Only)       Balance                                            Cognition Arousal/Alertness: Lethargic Behavior During Therapy: Agitated Overall Cognitive Status: Difficult to assess                                 General Comments: Pt lethargic throughout session, needing constant reminder to "sit up tall" and to stay awake. Pt became highly agitated after getting up to edge of bed.      Exercises Other Exercises Other Exercises: Supine therex performed with supervision-modA to B LE's x10 reps: ankle pumps, glute sets, quad sets, SLR (intermittent ModA, as pt highly lethargic). Pt supine BP at 110/76 following supine therex.     General Comments        Pertinent Vitals/Pain Pain Assessment: No/denies pain    Home Living                      Prior Function            PT Goals (current goals can now be  found in the care plan section) Acute Rehab PT Goals Patient Stated Goal: to get better PT Goal Formulation: With patient Time For Goal Achievement: 06/16/17 Potential to Achieve Goals: Good Progress towards PT goals: Progressing toward goals    Frequency    Min 2X/week      PT Plan Current plan remains appropriate    Co-evaluation              AM-PAC PT "6 Clicks" Daily Activity  Outcome Measure  Difficulty turning over in bed (including adjusting bedclothes, sheets and blankets)?: Total Difficulty moving from lying on back to sitting on the side of the bed? : Total Difficulty sitting down on and standing up from a chair with arms (e.g., wheelchair, bedside commode, etc,.)?: Total Help needed moving to and from a bed to chair (including a wheelchair)?: Total Help needed walking in hospital room?: Total Help needed climbing 3-5 steps with a railing? : Total 6 Click  Score: 6    End of Session   Activity Tolerance: Patient limited by lethargy;Treatment limited secondary to agitation Patient left: in bed;with call bell/phone within reach;with bed alarm set Nurse Communication: Mobility status PT Visit Diagnosis: Unsteadiness on feet (R26.81);Other abnormalities of gait and mobility (R26.89);Repeated falls (R29.6);Muscle weakness (generalized) (M62.81);History of falling (Z91.81)     Time: 8416-6063 PT Time Calculation (min) (ACUTE ONLY): 26 min  Charges:                       G Codes:       Oran Rein PT, SPT   Bevelyn Ngo 06/05/2017, 11:26 AM

## 2017-06-05 NOTE — Clinical Social Work Placement (Signed)
   CLINICAL SOCIAL WORK PLACEMENT  NOTE  Date:  06/05/2017  Patient Details  Name: Lindsay [name] MRN: [id number] Date of Birth: 1956/08/02  Clinical Social Work is seeking post-discharge placement for this patient at the [place] level of care (*CSW will initial, date and re-position this form in  chart as items are completed):  Yes   Patient/family provided with [facility] Work Department's list of facilities offering this level of care within the geographic area requested by the patient (or if unable, by the patient's family).  Yes   Patient/family informed of their freedom to choose among providers that offer the needed level of care, that participate in Medicare, Medicaid or managed care program needed by the patient, have an available bed and are willing to accept the patient.  Yes   Patient/family informed of [place] [facility]'s ownership interest in [facility] and [facility], as well as of the fact that they are under no obligation to receive care at these facilities.  PASRR submitted to EDS on       PASRR number received on       Existing PASRR number confirmed on 05/31/17     FL2 transmitted to all facilities in geographic area requested by pt/family on 05/31/17     FL2 transmitted to all facilities within larger geographic area on       Patient informed that his/her managed care company has contracts with or will negotiate with certain facilities, including the following:        Yes   Patient/family informed of bed offers received.  Patient chooses bed at  [facility])     Physician recommends and patient chooses bed at  [facility])    Patient to be transferred to  [facility]) on 06/05/17.  Patient to be transferred to facility by  (EMS)     Patient family notified on 06/05/17 of transfer.  Name of family member notified:   (none per request of patient)     PHYSICIAN       Additional Comment:     _______________________________________________ Shela Leff, LCSW 06/05/2017, 12:44 PM

## 2017-06-05 NOTE — Progress Notes (Signed)
ANTICOAGULATION CONSULT NOTE - Follow up [place] for apixaban dosing Indication: DVT  Allergies  Allergen Reactions  . Shrimp [Shellfish Allergy] Anaphylaxis    Patient Measurements: Height: 5\' 6"  (167.6 cm) Weight: 118 lb 8 oz (53.8 kg) IBW/kg (Calculated) : 59.3  Vital Signs: Temp: 99.3 F (37.4 C) (07/30 0614) Temp Source: Oral (07/30 0614) BP: 99/62 (07/30 0745) Pulse Rate: 96 (07/30 0452)  Labs:  Recent Labs  06/03/17 0355 06/04/17 0322 06/05/17 0401  HGB 7.1* 7.0* 6.9*  HCT 23.2* 22.5* 21.5*  PLT 255 265 251  CREATININE  --   --  1.11*    Estimated Creatinine Clearance: 46.9 mL/min (A) (by C-G formula based on SCr of 1.11 mg/dL (H)).  Assessment: 61 yo female with recurrent DVT starting Eliquis   Plan:  Apixaban 10 mg BID x 7 days and 5 mg BID thereafter ordered. CBC and SCr needed at least every 3 days per policy No s/sx of bleeding noted per RN.  Pharmacy will continue to follow.   Rayna Sexton L 06/05/2017,11:31 AM

## 2017-06-07 ENCOUNTER — Emergency Department: Payer: Medicare Other

## 2017-06-07 ENCOUNTER — Inpatient Hospital Stay
Admission: EM | Admit: 2017-06-07 | Discharge: 2017-07-08 | DRG: 871 | Disposition: E | Payer: Medicare Other | Attending: Internal Medicine | Admitting: Internal Medicine

## 2017-06-07 ENCOUNTER — Encounter: Payer: Self-pay | Admitting: Intensive Care

## 2017-06-07 ENCOUNTER — Inpatient Hospital Stay: Payer: Medicare Other

## 2017-06-07 DIAGNOSIS — Z66 Do not resuscitate: Secondary | ICD-10-CM | POA: Diagnosis not present

## 2017-06-07 DIAGNOSIS — I82413 Acute embolism and thrombosis of femoral vein, bilateral: Secondary | ICD-10-CM | POA: Diagnosis present

## 2017-06-07 DIAGNOSIS — E872 Acidosis, unspecified: Secondary | ICD-10-CM

## 2017-06-07 DIAGNOSIS — I5022 Chronic systolic (congestive) heart failure: Secondary | ICD-10-CM | POA: Diagnosis present

## 2017-06-07 DIAGNOSIS — R001 Bradycardia, unspecified: Secondary | ICD-10-CM | POA: Diagnosis not present

## 2017-06-07 DIAGNOSIS — R6521 Severe sepsis with septic shock: Secondary | ICD-10-CM | POA: Diagnosis present

## 2017-06-07 DIAGNOSIS — E559 Vitamin D deficiency, unspecified: Secondary | ICD-10-CM | POA: Diagnosis present

## 2017-06-07 DIAGNOSIS — I428 Other cardiomyopathies: Secondary | ICD-10-CM | POA: Diagnosis present

## 2017-06-07 DIAGNOSIS — Z8249 Family history of ischemic heart disease and other diseases of the circulatory system: Secondary | ICD-10-CM | POA: Diagnosis not present

## 2017-06-07 DIAGNOSIS — N179 Acute kidney failure, unspecified: Secondary | ICD-10-CM | POA: Diagnosis present

## 2017-06-07 DIAGNOSIS — D638 Anemia in other chronic diseases classified elsewhere: Secondary | ICD-10-CM | POA: Diagnosis present

## 2017-06-07 DIAGNOSIS — E43 Unspecified severe protein-calorie malnutrition: Secondary | ICD-10-CM | POA: Diagnosis present

## 2017-06-07 DIAGNOSIS — G9341 Metabolic encephalopathy: Secondary | ICD-10-CM | POA: Diagnosis present

## 2017-06-07 DIAGNOSIS — E610 Copper deficiency: Secondary | ICD-10-CM | POA: Diagnosis present

## 2017-06-07 DIAGNOSIS — A419 Sepsis, unspecified organism: Secondary | ICD-10-CM | POA: Diagnosis not present

## 2017-06-07 DIAGNOSIS — I468 Cardiac arrest due to other underlying condition: Secondary | ICD-10-CM | POA: Diagnosis not present

## 2017-06-07 DIAGNOSIS — G8929 Other chronic pain: Secondary | ICD-10-CM | POA: Diagnosis present

## 2017-06-07 DIAGNOSIS — Z681 Body mass index (BMI) 19 or less, adult: Secondary | ICD-10-CM | POA: Diagnosis not present

## 2017-06-07 DIAGNOSIS — J9600 Acute respiratory failure, unspecified whether with hypoxia or hypercapnia: Secondary | ICD-10-CM | POA: Diagnosis not present

## 2017-06-07 DIAGNOSIS — Z9221 Personal history of antineoplastic chemotherapy: Secondary | ICD-10-CM | POA: Diagnosis not present

## 2017-06-07 DIAGNOSIS — A0472 Enterocolitis due to Clostridium difficile, not specified as recurrent: Secondary | ICD-10-CM | POA: Diagnosis present

## 2017-06-07 DIAGNOSIS — R34 Anuria and oliguria: Secondary | ICD-10-CM | POA: Diagnosis present

## 2017-06-07 DIAGNOSIS — Z853 Personal history of malignant neoplasm of breast: Secondary | ICD-10-CM | POA: Diagnosis not present

## 2017-06-07 DIAGNOSIS — Z91013 Allergy to seafood: Secondary | ICD-10-CM

## 2017-06-07 DIAGNOSIS — A4151 Sepsis due to Escherichia coli [E. coli]: Principal | ICD-10-CM | POA: Diagnosis present

## 2017-06-07 DIAGNOSIS — M797 Fibromyalgia: Secondary | ICD-10-CM | POA: Diagnosis present

## 2017-06-07 DIAGNOSIS — Z87891 Personal history of nicotine dependence: Secondary | ICD-10-CM

## 2017-06-07 DIAGNOSIS — Z86718 Personal history of other venous thrombosis and embolism: Secondary | ICD-10-CM

## 2017-06-07 DIAGNOSIS — I951 Orthostatic hypotension: Secondary | ICD-10-CM | POA: Diagnosis present

## 2017-06-07 DIAGNOSIS — D72819 Decreased white blood cell count, unspecified: Secondary | ICD-10-CM | POA: Diagnosis present

## 2017-06-07 DIAGNOSIS — R7881 Bacteremia: Secondary | ICD-10-CM

## 2017-06-07 DIAGNOSIS — I82431 Acute embolism and thrombosis of right popliteal vein: Secondary | ICD-10-CM | POA: Diagnosis present

## 2017-06-07 DIAGNOSIS — Z79891 Long term (current) use of opiate analgesic: Secondary | ICD-10-CM

## 2017-06-07 DIAGNOSIS — D65 Disseminated intravascular coagulation [defibrination syndrome]: Secondary | ICD-10-CM | POA: Diagnosis not present

## 2017-06-07 DIAGNOSIS — R609 Edema, unspecified: Secondary | ICD-10-CM | POA: Diagnosis present

## 2017-06-07 DIAGNOSIS — Z87892 Personal history of anaphylaxis: Secondary | ICD-10-CM

## 2017-06-07 DIAGNOSIS — I959 Hypotension, unspecified: Secondary | ICD-10-CM

## 2017-06-07 DIAGNOSIS — Z87442 Personal history of urinary calculi: Secondary | ICD-10-CM

## 2017-06-07 DIAGNOSIS — Z9884 Bariatric surgery status: Secondary | ICD-10-CM

## 2017-06-07 DIAGNOSIS — I480 Paroxysmal atrial fibrillation: Secondary | ICD-10-CM | POA: Diagnosis present

## 2017-06-07 DIAGNOSIS — E162 Hypoglycemia, unspecified: Secondary | ICD-10-CM | POA: Diagnosis present

## 2017-06-07 DIAGNOSIS — N390 Urinary tract infection, site not specified: Secondary | ICD-10-CM | POA: Diagnosis present

## 2017-06-07 DIAGNOSIS — R579 Shock, unspecified: Secondary | ICD-10-CM | POA: Diagnosis present

## 2017-06-07 DIAGNOSIS — Z90710 Acquired absence of both cervix and uterus: Secondary | ICD-10-CM

## 2017-06-07 DIAGNOSIS — I340 Nonrheumatic mitral (valve) insufficiency: Secondary | ICD-10-CM | POA: Diagnosis present

## 2017-06-07 DIAGNOSIS — Z01818 Encounter for other preprocedural examination: Secondary | ICD-10-CM

## 2017-06-07 DIAGNOSIS — Z833 Family history of diabetes mellitus: Secondary | ICD-10-CM

## 2017-06-07 LAB — COMPREHENSIVE METABOLIC PANEL
ALK PHOS: 186 U/L — AB (ref 38–126)
ALT: 16 U/L (ref 14–54)
AST: 49 U/L — AB (ref 15–41)
Albumin: 1.4 g/dL — ABNORMAL LOW (ref 3.5–5.0)
Anion gap: 10 (ref 5–15)
BILIRUBIN TOTAL: 1 mg/dL (ref 0.3–1.2)
BUN: 21 mg/dL — AB (ref 6–20)
CALCIUM: 7.7 mg/dL — AB (ref 8.9–10.3)
CHLORIDE: 115 mmol/L — AB (ref 101–111)
CO2: 17 mmol/L — ABNORMAL LOW (ref 22–32)
CREATININE: 1.78 mg/dL — AB (ref 0.44–1.00)
GFR, EST AFRICAN AMERICAN: 35 mL/min — AB (ref >60)
GFR, EST NON AFRICAN AMERICAN: 30 mL/min — AB (ref >60)
Glucose, Bld: 223 mg/dL — ABNORMAL HIGH (ref 65–99)
Potassium: 3.6 mmol/L (ref 3.5–5.1)
Sodium: 142 mmol/L (ref 135–145)
TOTAL PROTEIN: 3.6 g/dL — AB (ref 6.5–8.1)

## 2017-06-07 LAB — GLUCOSE, CAPILLARY
GLUCOSE-CAPILLARY: 92 mg/dL (ref 65–99)
GLUCOSE-CAPILLARY: 90 mg/dL (ref 65–99)
GLUCOSE-CAPILLARY: 79 mg/dL (ref 65–99)
GLUCOSE-CAPILLARY: 97 mg/dL (ref 65–99)
Glucose-Capillary: 129 mg/dL — ABNORMAL HIGH (ref 65–99)
Glucose-Capillary: <10 mg/dL — CL (ref 65–99)
Glucose-Capillary: 97 mg/dL (ref 65–99)
Glucose-Capillary: 72 mg/dL (ref 65–99)

## 2017-06-07 LAB — CBC WITH DIFFERENTIAL/PLATELET
BASOS ABS: 0 10*3/uL (ref 0–0.1)
Band Neutrophils: 10 %
Basophils Relative: 0 %
Blasts: 0 %
EOS PCT: 0 %
Eosinophils Absolute: 0 10*3/uL (ref 0–0.7)
HCT: 21.4 % — ABNORMAL LOW (ref 35.0–47.0)
HEMOGLOBIN: 6.6 g/dL — AB (ref 12.0–16.0)
LYMPHS ABS: 0.4 10*3/uL — AB (ref 1.0–3.6)
Lymphocytes Relative: 18 %
MCH: 31.8 pg (ref 26.0–34.0)
MCHC: 30.7 g/dL — AB (ref 32.0–36.0)
MCV: 103.6 fL — ABNORMAL HIGH (ref 80.0–100.0)
METAMYELOCYTES PCT: 1 %
MYELOCYTES: 2 %
Monocytes Absolute: 0.1 10*3/uL — ABNORMAL LOW (ref 0.2–0.9)
Monocytes Relative: 4 %
NEUTROS PCT: 65 %
Neutro Abs: 1.6 10*3/uL (ref 1.4–6.5)
Other: 0 %
PROMYELOCYTES ABS: 0 %
Platelets: 257 10*3/uL (ref 150–440)
RBC: 2.07 MIL/uL — AB (ref 3.80–5.20)
RDW: 19.3 % — ABNORMAL HIGH (ref 11.5–14.5)
Smear Review: ADEQUATE
WBC: 2.1 10*3/uL — AB (ref 3.6–11.0)
nRBC: 12 /100 WBC — ABNORMAL HIGH

## 2017-06-07 LAB — LACTIC ACID, PLASMA
Lactic Acid, Venous: 5.7 mmol/L (ref 0.5–1.9)
Lactic Acid, Venous: 9.6 mmol/L (ref 0.5–1.9)

## 2017-06-07 LAB — APTT: APTT: 66 s — AB (ref 24–36)

## 2017-06-07 LAB — CBC
HEMATOCRIT: 37.2 % (ref 35.0–47.0)
HEMOGLOBIN: 11.2 g/dL — AB (ref 12.0–16.0)
MCH: 31.3 pg (ref 26.0–34.0)
MCHC: 30 g/dL — AB (ref 32.0–36.0)
MCV: 104.4 fL — AB (ref 80.0–100.0)
Platelets: 218 10*3/uL (ref 150–440)
RBC: 3.56 MIL/uL — ABNORMAL LOW (ref 3.80–5.20)
RDW: 24.4 % — ABNORMAL HIGH (ref 11.5–14.5)
WBC: 0.6 10*3/uL — CL (ref 3.6–11.0)

## 2017-06-07 LAB — PROTIME-INR
INR: 3.36
PROTHROMBIN TIME: 34.8 s — AB (ref 11.4–15.2)

## 2017-06-07 LAB — BLOOD GAS, VENOUS
Acid-base deficit: 7.7 mmol/L — ABNORMAL HIGH (ref 0.0–2.0)
Bicarbonate: 16.4 mmol/L — ABNORMAL LOW (ref 20.0–28.0)
FIO2: 0.35
O2 Saturation: 64.4 %
PATIENT TEMPERATURE: 37
pCO2, Ven: 29 mmHg — ABNORMAL LOW (ref 44.0–60.0)
pH, Ven: 7.36 (ref 7.250–7.430)
pO2, Ven: 35 mmHg (ref 32.0–45.0)

## 2017-06-07 LAB — URINALYSIS, ROUTINE W REFLEX MICROSCOPIC
BILIRUBIN URINE: NEGATIVE
Glucose, UA: NEGATIVE mg/dL
Ketones, ur: NEGATIVE mg/dL
NITRITE: NEGATIVE
PH: 5 (ref 5.0–8.0)
Protein, ur: NEGATIVE mg/dL
SPECIFIC GRAVITY, URINE: 1.015 (ref 1.005–1.030)

## 2017-06-07 LAB — MRSA PCR SCREENING: MRSA by PCR: NEGATIVE

## 2017-06-07 LAB — TROPONIN I

## 2017-06-07 LAB — PREPARE RBC (CROSSMATCH)

## 2017-06-07 LAB — PROCALCITONIN: Procalcitonin: 25.32 ng/mL

## 2017-06-07 LAB — LIPASE, BLOOD: Lipase: 14 U/L (ref 11–51)

## 2017-06-07 LAB — PATHOLOGIST SMEAR REVIEW

## 2017-06-07 LAB — CK: CK TOTAL: 79 U/L (ref 38–234)

## 2017-06-07 MED ORDER — HEPARIN BOLUS VIA INFUSION
3700.0000 [IU] | Freq: Once | INTRAVENOUS | Status: DC
  Filled 2017-06-07: qty 3700

## 2017-06-07 MED ORDER — SODIUM CHLORIDE 0.9 % IV SOLN
INTRAVENOUS | Status: DC
  Administered 2017-06-07 – 2017-06-08 (×3): via INTRAVENOUS

## 2017-06-07 MED ORDER — HYDROCORTISONE NA SUCCINATE PF 100 MG IJ SOLR
50.0000 mg | Freq: Four times a day (QID) | INTRAMUSCULAR | Status: DC
  Administered 2017-06-08: 50 mg via INTRAVENOUS
  Filled 2017-06-07: qty 2

## 2017-06-07 MED ORDER — PIPERACILLIN-TAZOBACTAM 3.375 G IVPB 30 MIN
INTRAVENOUS | Status: AC
  Filled 2017-06-07: qty 50

## 2017-06-07 MED ORDER — DEXTROSE 50 % IV SOLN
INTRAVENOUS | Status: AC
  Administered 2017-06-07: 50 mL via INTRAVENOUS
  Filled 2017-06-07: qty 50

## 2017-06-07 MED ORDER — VANCOMYCIN 50 MG/ML ORAL SOLUTION
125.0000 mg | Freq: Four times a day (QID) | ORAL | Status: DC
  Filled 2017-06-07 (×2): qty 2.5

## 2017-06-07 MED ORDER — ACETAMINOPHEN 325 MG PO TABS: 650.0000 mg | ORAL_TABLET | Freq: Four times a day (QID) | ORAL | Status: DC | PRN

## 2017-06-07 MED ORDER — ONDANSETRON HCL 4 MG/2ML IJ SOLN: 4.0000 mg | Freq: Four times a day (QID) | INTRAMUSCULAR | Status: DC | PRN

## 2017-06-07 MED ORDER — HEPARIN (PORCINE) IN NACL 100-0.45 UNIT/ML-% IJ SOLN
950.0000 [IU]/h | INTRAMUSCULAR | Status: DC
  Administered 2017-06-07: 950 [IU]/h via INTRAVENOUS
  Filled 2017-06-07: qty 250

## 2017-06-07 MED ORDER — DEXTROSE 10 % IV SOLN: INTRAVENOUS | Status: DC

## 2017-06-07 MED ORDER — ACETAMINOPHEN 650 MG RE SUPP
650.0000 mg | Freq: Four times a day (QID) | RECTAL | Status: DC | PRN
Start: 2017-06-07 — End: 2017-06-08

## 2017-06-07 MED ORDER — VANCOMYCIN HCL IN DEXTROSE 1-5 GM/200ML-% IV SOLN
1000.0000 mg | Freq: Once | INTRAVENOUS | Status: AC
  Administered 2017-06-07: 1000 mg via INTRAVENOUS
  Filled 2017-06-07: qty 200

## 2017-06-07 MED ORDER — DEXTROSE 5 % IV SOLN
20.0000 ug/min | Freq: Once | INTRAVENOUS | Status: AC
  Administered 2017-06-07: 20 ug/min via INTRAVENOUS
  Filled 2017-06-07: qty 4

## 2017-06-07 MED ORDER — SODIUM CHLORIDE 0.9 % IV SOLN
0.0000 ug/min | INTRAVENOUS | Status: DC
  Administered 2017-06-07: 100 ug/min via INTRAVENOUS
  Administered 2017-06-08 (×4): 400 ug/min via INTRAVENOUS
  Filled 2017-06-07 (×10): qty 4

## 2017-06-07 MED ORDER — ONDANSETRON HCL 4 MG PO TABS: 4.0000 mg | ORAL_TABLET | Freq: Four times a day (QID) | ORAL | Status: DC | PRN

## 2017-06-07 MED ORDER — PIPERACILLIN-TAZOBACTAM 3.375 G IVPB 30 MIN
3.3750 g | Freq: Once | INTRAVENOUS | Status: AC
  Administered 2017-06-07: 3.375 g via INTRAVENOUS

## 2017-06-07 MED ORDER — NOREPINEPHRINE BITARTRATE 1 MG/ML IV SOLN: 0.0000 ug/min | Freq: Once | INTRAVENOUS | Status: DC

## 2017-06-07 MED ORDER — ETOMIDATE 2 MG/ML IV SOLN: 14.0000 mg | Freq: Once | INTRAVENOUS | Status: DC

## 2017-06-07 MED ORDER — SODIUM CHLORIDE 0.9 % IV BOLUS (SEPSIS)
1000.0000 mL | Freq: Once | INTRAVENOUS | Status: AC
  Administered 2017-06-07: 1000 mL via INTRAVENOUS

## 2017-06-07 MED ORDER — ROCURONIUM BROMIDE 50 MG/5ML IV SOLN
60.0000 mg | Freq: Once | INTRAVENOUS | Status: DC
  Filled 2017-06-07: qty 6

## 2017-06-07 MED ORDER — DEXTROSE 10 % IV SOLN
INTRAVENOUS | Status: DC
  Administered 2017-06-07: 15:00:00 via INTRAVENOUS

## 2017-06-07 MED ORDER — NOREPINEPHRINE BITARTRATE 1 MG/ML IV SOLN
0.0000 ug/min | Freq: Once | INTRAVENOUS | Status: AC
  Administered 2017-06-07: 5 ug/min via INTRAVENOUS
  Filled 2017-06-07: qty 4

## 2017-06-07 MED ORDER — IOPAMIDOL (ISOVUE-370) INJECTION 76%: 60.0000 mL | Freq: Once | INTRAVENOUS | Status: DC | PRN

## 2017-06-07 MED ORDER — PIPERACILLIN-TAZOBACTAM 3.375 G IVPB
3.3750 g | Freq: Three times a day (TID) | INTRAVENOUS | Status: DC
  Administered 2017-06-08: 3.375 g via INTRAVENOUS
  Filled 2017-06-07: qty 50

## 2017-06-07 MED ORDER — SODIUM CHLORIDE 0.9 % IV SOLN: 10.0000 mL/h | Freq: Once | INTRAVENOUS | Status: DC

## 2017-06-07 MED ORDER — VANCOMYCIN HCL IN DEXTROSE 750-5 MG/150ML-% IV SOLN
750.0000 mg | INTRAVENOUS | Status: DC
  Filled 2017-06-07: qty 150

## 2017-06-07 MED ORDER — DEXTROSE 5 % IV SOLN
INTRAVENOUS | Status: AC
  Filled 2017-06-07: qty 10

## 2017-06-07 MED ORDER — DEXTROSE 50 % IV SOLN
1.0000 | Freq: Once | INTRAVENOUS | Status: AC
  Administered 2017-06-07: 50 mL via INTRAVENOUS

## 2017-06-07 MED ORDER — DOPAMINE-DEXTROSE 3.2-5 MG/ML-% IV SOLN
INTRAVENOUS | Status: AC
  Administered 2017-06-07: 50 mg
  Filled 2017-06-07: qty 250

## 2017-06-07 MED ORDER — METRONIDAZOLE IN NACL 5-0.79 MG/ML-% IV SOLN
500.0000 mg | INTRAVENOUS | Status: AC
  Administered 2017-06-07: 500 mg via INTRAVENOUS
  Filled 2017-06-07: qty 100

## 2017-06-07 MED ORDER — HEPARIN (PORCINE) IN NACL 100-0.45 UNIT/ML-% IJ SOLN: 800.0000 [IU]/h | INTRAMUSCULAR | Status: DC

## 2017-06-07 MED ORDER — DEXTROSE 5 % IV SOLN
0.0000 ug/min | INTRAVENOUS | Status: DC
  Administered 2017-06-07: 50 ug/min via INTRAVENOUS
  Administered 2017-06-08 (×5): 300 ug/min via INTRAVENOUS
  Filled 2017-06-07 (×14): qty 16

## 2017-06-07 NOTE — Progress Notes (Signed)
Pharmacy Antibiotic Note  Lindsay [name] is a 61 y.o. female admitted on 06/14/2017 with sepsis.  Pharmacy has been consulted for vanc/zosyn dosing.  Plan: Patient received vanc 1g IV x 1 and zosyn 3.375g IV x 1 in ED  Will continue w/ vanc 750 mg IV q24h and will draw a vanc trough 8/3 @ 1900 prior to 3rd dose. Will start zosyn 3.375g IV q8h extended infusion dosing -- if CrCl falls < 20 ml/min will adjust dose to q12h. Ke 0.0285 T1/2 24 hours Goal trough 15 - 20 mcg/mL   Height: 5\' 6"  (167.6 cm) Weight: 118 lb (53.5 kg) IBW/kg (Calculated) : 59.3  Temp (24hrs), Avg:97.5 F (36.4 C), Min:95.8 F (35.4 C), Max:99.8 F (37.7 C)   Recent Labs Lab 05/31/17 2359 06/01/17 0121 06/02/17 0456 06/03/17 0355 06/04/17 0322 06/05/17 0401 06/11/2017 1456 06/16/2017 2002  WBC 14.3*  --  13.0* 14.5* 13.1* 15.0* 2.1*  --   CREATININE 1.22*  --  0.99  --   --  1.11* 1.78*  --   LATICACIDVEN  --  0.9  --   --   --   --  5.7* 9.6*    Estimated Creatinine Clearance: 29.1 mL/min (A) (by C-G formula based on SCr of 1.78 mg/dL (H)).    Allergies  Allergen Reactions  . Shrimp [Shellfish Allergy] Anaphylaxis    Thank you for allowing pharmacy to be a part of this patient's care.  Tobie Lords, PharmD, BCPS Clinical Pharmacist 06/19/2017

## 2017-06-07 NOTE — ED Notes (Signed)
IO placed in R shin by MD Jacqualine Code

## 2017-06-07 NOTE — ED Notes (Signed)
IV Team and NP from ICU at bedside looking for IV access

## 2017-06-07 NOTE — Consult Note (Addendum)
Name: Lindsay [name] MRN: [id number] DOB: [birth year]    ADMISSION DATE:  06/20/2017 CONSULTATION DATE: 06/13/2017  REFERRING MD :  Dr. Verdell Carmine  CHIEF COMPLAINT:  Altered Mental status  BRIEF PATIENT DESCRIPTION: 61 year old female with Altered Mental Status  And sepsis Secondary to bactremia, UTI,C-diff  SIGNIFICANT EVENTS  8/1 Patient admitted to the SDU with AMS and shock secondary to possible sepsis  STUDIES:  01/06/17 ECHO>>The cavity size was normal. Systolic function wasnormal. The estimated ejection fraction was in the range of 55% to 65%. 06/07/14 Ultrasound Right lower leg>>Sonographic survey of the right lower extremity isSonographic survey of the right lower extremity is positive for DVT with occlusive thrombus of the popliteal vein and nonocclusive thrombus of the femoral vein and common femoral vein. and common femoral vein. 8/1 CT head>>No acute intracranial abnormalities. Chronic atrophy and small vessel ischemic changes.    HISTORY OF PRESENT ILLNESS:  Lindsay [name] is a 61 year old female with known history Breast Cancer, gastric bypass CHF,DVT S/P IVC filter, fibromyalgia, proximal atrial fibrillation, peripheral neuropathy and severe malnutrition. Patient was recently admitted from 7/26 - 7/30 to [facility] and was discharged to SNF.  Patient was noted to have left lower extremity and was started on eliquis.  She was also seen by Vascular  Surgeon and he did not recommend thrombectomy.  She presents to [facility] ON 8/1 with Altered mental status, noted to be in severe septic shock with tachycardia, hypothermia, leukopenia and severe hypotension.  Patient received received 3liters of fluid of fluid and remained persistently hypotensive therefore she was started on pressors.  Unable to place a central line as as the patient had blood clots on right as well as left femoral vein.  Right and Left internal Jugular were very small and  Did not appear to be able to safely puncture without  collapsing the lung. Patient's condition was discussed with Dr. Alva Garnet  And instructed to use I/O access for tonight.  PAST MEDICAL HISTORY :   has a past medical history of Anemia of chronic disease; C. difficile diarrhea (10/2011); Cancer ([place]); Chronic cough; Chronic systolic CHF (congestive heart failure) ([place]); Depression; Depression; Fibromyalgia; History of DVT (deep vein thrombosis); breast cancer; Kidney stones (2013); Malnutrition ([place]); Moderate mitral regurgitation; Muscle spasm; NICM (nonischemic cardiomyopathy) ([place]); Orthostatic hypotension; PAF (paroxysmal atrial fibrillation) ([place]); Peripheral neuropathy; Postural hypotension; S/P gastric bypass; and Vitamin D deficiency.  has a past surgical history that includes Breast lumpectomy; Lithotripsy (jan 2013); Esophagogastroduodenoscopy (egd) with propofol (N/A, 05/25/2015); and Abdominal hysterectomy. Prior to Admission medications   Medication Sig Start Date End Date Taking? Authorizing Provider  Cholecalciferol (VITAMIN D3) 1000 units CAPS Take 1,000 Units by mouth daily.   Yes [provider]  clonazePAM (KLONOPIN) 0.5 MG tablet Take 1 tablet (0.5 mg total) by mouth 2 (two) times daily as needed for anxiety. 06/05/17  Yes Gladstone Lighter, MD  fluticasone (FLONASE) 50 MCG/ACT nasal spray Place 1 spray into the nose daily.    Yes [provider]  lidocaine (LIDODERM) 5 % Place 1-3 patches onto the skin daily. Remove & Discard patch within 12 hours or as directed by MD    Yes [provider]  loperamide (IMODIUM) 2 MG capsule Take 2 mg by mouth 4 (four) times daily as needed for diarrhea or loose stools.   Yes [provider]  loratadine (CLARITIN) 10 MG tablet Take 10 mg by mouth daily as needed for allergies.    Yes [provider]  morphine (MS CONTIN) 15 MG 12 hr tablet Take 1 tablet (15 mg total) by mouth every 8 (eight) hours. 06/05/17  Yes Gladstone Lighter, MD  Multiple  Vitamins-Minerals (THEREMS M PO) Take 1 tablet by mouth daily.   Yes [provider]  omeprazole (PRILOSEC) 20 MG capsule Take 20 mg by mouth daily.  07/07/16 07/07/17 Yes [provider]  oxyCODONE (ROXICODONE) 15 MG immediate release tablet Take 1 tablet (15 mg total) by mouth every 6 (six) hours as needed for pain (moderate and severe). 06/05/17  Yes Gladstone Lighter, MD  tiZANidine (ZANAFLEX) 4 MG tablet Take 4 mg by mouth every 6 (six) hours as needed for muscle spasms.   Yes [provider]  triamcinolone cream (KENALOG) 0.1 % Apply 1 application topically 2 (two) times daily.   Yes [provider]  trolamine salicylate (ASPERCREME) 10 % cream Apply 1 application topically as needed for muscle pain.   Yes [provider]  vitamin B-12 (CYANOCOBALAMIN) 1000 MCG tablet Take 1,000 mcg by mouth daily.    Yes [provider]  vitamin C (ASCORBIC ACID) 500 MG tablet Take 500 mg by mouth daily.   Yes [provider]  acidophilus (RISAQUAD) CAPS capsule Take 1 capsule by mouth daily. Patient not taking: Reported on 06/15/2017 06/06/17   Gladstone Lighter, MD  anastrozole (ARIMIDEX) 1 MG tablet Take 1 mg by mouth daily.      [provider]  apixaban (ELIQUIS) 5 MG TABS tablet Take 2 tablets (10 mg total) by mouth 2 (two) times daily. Patient not taking: Reported on 06/26/2017 06/05/17 16-Jun-2017  Gladstone Lighter, MD  apixaban (ELIQUIS) 5 MG TABS tablet Take 1 tablet (5 mg total) by mouth 2 (two) times daily. Please start after finishing the first week of 2 tablets twice a dose- that is from 06/09/17 Patient not taking: Reported on 06/26/2017 06/09/17   Gladstone Lighter, MD  feeding supplement, ENSURE ENLIVE, (ENSURE ENLIVE) LIQD Take 237 mLs by mouth 2 (two) times daily between meals. 06/05/17   Gladstone Lighter, MD  flecainide (TAMBOCOR) 100 MG tablet Take 0.5 tablets (50 mg total) by mouth every 12 (twelve) hours. Patient not taking:  Reported on 06/18/2017 08/27/15   Fritzi Mandes, MD  fludrocortisone (FLORINEF) 0.1 MG tablet Take 0.2 mg by mouth daily.    [provider]  furosemide (LASIX) 20 MG tablet Take 20 mg by mouth daily.    [provider]  midodrine (PROAMATINE) 10 MG tablet Take 0.5-1 tablets (5-10 mg total) by mouth 3 (three) times daily with meals. Take 10 mg in the morning and 5 mg at noon and supper Patient not taking: Reported on 06/27/2017 06/05/17   Gladstone Lighter, MD  mirtazapine (REMERON) 30 MG tablet Take 1 tablet (30 mg total) by mouth at bedtime. Patient not taking: Reported on 06/11/2017 04/01/17   Gladstone Lighter, MD  potassium chloride (K-DUR) 10 MEQ tablet Take 20 mEq by mouth 2 (two) times daily.    [provider]  pregabalin (LYRICA) 150 MG capsule Take 1 capsule (150 mg total) by mouth 2 (two) times daily. 04/01/17   Gladstone Lighter, MD  sertraline (ZOLOFT) 100 MG tablet Take 150 mg by mouth daily.     [provider]  vancomycin (VANCOCIN) 50 mg/mL oral solution Take 2.5 mLs (125 mg total) by mouth every 6 (six) hours. X 10 days Patient not taking: Reported on 06/22/2017 06/05/17   Gladstone Lighter, MD   Allergies  Allergen Reactions  . Shrimp [  Shellfish Allergy] Anaphylaxis    FAMILY HISTORY:  family history includes CAD in her father; Diabetes Mellitus II in her father and sister; Hypertension in her father. SOCIAL HISTORY:  reports that she quit smoking about 31 years ago. Her smoking use included Cigarettes. She has a 0.25 pack-year smoking history. She has never used smokeless tobacco. She reports that she does not drink alcohol or use drugs.  REVIEW OF SYSTEMS:   Constitutional: Negative for fever, chills, weight loss, malaise/fatigue and diaphoresis.  HENT: Negative for hearing loss, ear pain, nosebleeds, congestion, sore throat, neck pain, tinnitus and ear discharge.   Eyes: Negative for blurred vision, double vision, photophobia, pain, discharge  and redness.  Respiratory: Negative for cough, hemoptysis, sputum production, shortness of breath, wheezing and stridor.   Cardiovascular: Negative for chest pain, palpitations, orthopnea, claudication, leg swelling and PND.  Gastrointestinal: Negative for heartburn, nausea, vomiting, abdominal pain, diarrhea, constipation, blood in stool and melena.  Genitourinary: Negative for dysuria, urgency, frequency, hematuria and flank pain.  Musculoskeletal: Negative for myalgias, back pain, joint pain and falls.  Skin: Negative for itching and rash.  Neurological: Negative for dizziness, tingling, tremors, sensory change, speech change, focal weakness, seizures, loss of consciousness, weakness and headaches.  Endo/Heme/Allergies: Negative for environmental allergies and polydipsia. Does not bruise/bleed easily.  SUBJECTIVE: Patient is moaning and complaining of pain  VITAL SIGNS: Temp:  [95.8 F (35.4 C)-99.8 F (37.7 C)] 95.8 F (35.4 C) (08/01 1850) Pulse Rate:  [121-143] 127 (08/01 2018) Resp:  [19-33] 27 (08/01 2018) BP: (49-143)/(36-118) 96/84 (08/01 2018) SpO2:  [92 %-100 %] 100 % (08/01 2018) Weight:  [53.5 kg (118 lb)] 53.5 kg (118 lb) (08/01 1444)  PHYSICAL EXAMINATION: General:  Sickly appearing Cachectic female Neuro:  Awake but confused HEENT: AT,[place],No JVD Cardiovascular: s1s2,tachcardic, no m/r/g Lungs: Clear bilaterally, no wheezes,crackles, rhonchi Abdomen:  Soft ,NT,ND Musculoskeletal:  +2 Pitting edema, no clubbing or cyanosis Skin: multiple wounds, no obvious skin rash/ulcer.   Recent Labs Lab 05/31/17 2359 06/02/17 0456 06/05/17 0401 06/12/2017 1456  NA 141 140  --  142  K 4.7 3.8  --  3.6  CL 117* 116*  --  115*  CO2 18* 20*  --  17*  BUN 21* 17  --  21*  CREATININE 1.22* 0.99 1.11* 1.78*  GLUCOSE 83 75  --  223*    Recent Labs Lab 06/04/17 0322 06/05/17 0401 06/30/2017 1456  HGB 7.0* 6.9* 6.6*  HCT 22.5* 21.5* 21.4*  WBC 13.1* 15.0* 2.1*  PLT 265 251  257   US Venous Img Lower Unilateral Right  Result Date: 06/28/2017 CLINICAL DATA:  61 year old female with a history of right lower extremity swelling. Recent left lower extremity DVT study positive 06/01/2017 EXAM: RIGHT LOWER EXTREMITY VENOUS DOPPLER ULTRASOUND TECHNIQUE: Gray-scale sonography with graded compression, as well as color Doppler and duplex ultrasound were performed to evaluate the lower extremity deep venous systems from the level of the common femoral vein and including the common femoral, femoral, profunda femoral, popliteal and calf veins including the posterior tibial, peroneal and gastrocnemius veins when visible. The superficial great saphenous vein was also interrogated. Spectral Doppler was utilized to evaluate flow at rest and with distal augmentation maneuvers in the common femoral, femoral and popliteal veins. COMPARISON:  None. FINDINGS: Contralateral Common Femoral Vein: Flow maintained within the left common femoral vein. Common Femoral Vein: Flow maintained within the common femoral vein, with nonocclusive thrombus involving the wall of the common femoral vein. Respiratory phasicity is  maintained. Saphenofemoral Junction: No evidence of thrombus. Normal compressibility and flow on color Doppler imaging. Profunda Femoral Vein: Nonocclusive thrombus of the profunda with flow maintained. Femoral Vein: Nonocclusive thrombus of the femoral vein with wall thickening and incomplete compressibility. Color flow maintained. Popliteal Vein: Occlusive thrombus of the popliteal vein Calf Veins: Incomplete visualization of the tibial veins Superficial Great Saphenous Vein: No evidence of thrombus. Normal compressibility and flow on color Doppler imaging. Other Findings:  None. IMPRESSION: Sonographic survey of the right lower extremity is positive for DVT with occlusive thrombus of the popliteal vein and nonocclusive thrombus of the femoral vein and common femoral vein. These results were called  by telephone at the time of interpretation on 06/29/2017 at 8:33 pm to admitting hospitalist physician. Electronically Signed   By: Corrie Mckusick D.O.   On: 06/11/2017 20:38   Dg Chest Portable 1 View  Result Date: 07/01/2017 CLINICAL DATA:  Hypoxia.  Failed central line placement EXAM: PORTABLE CHEST 1 VIEW COMPARISON:  None. FINDINGS: Heart is normal size. No pneumothorax after attempted central line placement. No confluent opacities or effusions. No acute bony abnormality. IMPRESSION: No active disease. Electronically Signed   By: Rolm Baptise M.D.   On: 07/05/2017 17:57   Dg Chest Port 1 View  Result Date: 06/25/2017 CLINICAL DATA:  Intubation.  Altered mental status EXAM: PORTABLE CHEST 1 VIEW COMPARISON:  06/01/2017 FINDINGS: Normal heart size and mediastinal contours. Low volume chest. There is no edema, consolidation, effusion, or pneumothorax. There is artifact from EKG leads and defibrillator pads. I spoke with the patient's nurse at time of dictation, contrary to the history the patient has not been intubated. Postsurgical changes at the right axilla and central diaphragm. IMPRESSION: Low volume chest without acute finding. Electronically Signed   By: Monte Fantasia M.D.   On: 06/27/2017 15:25     ASSESSMENT / PLAN:  PULMONARY A: Acute Respiratory failure secondary to shock Mildly Tachypnic Shock ?PE  P:   Intubated for agonal respirations Vent setting established VAP Bundle Implemented Discussed with the mother who has confirmed Full Code Status on her Unable to Obtain CT chest as the patient does not have appropriate I/V access  CARDIOVASCULAR A:   SevereShock  Atrial Fibrillation P:  Continue Pressors Continue I/V fluids Keep MAP goals between 55-60 Will hold Flecainide will restart once patient is stable.  RENAL A:   Acute Kidney Injury related to sepsis P:   Monitor I/O closely Follow BMET closely   GASTROINTESTINAL A:   Hx of Gastric Bypass Copper  Deficiency P:   Will Keep NPO Dietary consulted for help with copper supplementation  HEMATOLOGIC A:   Acute On Chronic Anemia DVT of popliteal vein and nonocclusivethrombus of the femoral vein and common femoral vein.  P:  Received 1 unit of PRBC on 8/1 Started the patient on heparin gtt  Trend H/h  INFECTIOUS A:   Bactremia UTI Fluminant C-DIFF  HCAP highly unlikely  Severe Septic shock  Hypothermia Lactic Acidosis Severe leukopenia  P:   Continue Oral Rectal  Vancomycin Continue FLAGYL/Vancomycin Added Meropenem Follow Culture Monitor fever,CBC Trend Lactic acid/ PCT   ENDOCRINE A:   Hypoglycemia P:   Monitor Blood glucose closely Start D10 as needed for hypoglycemia  NEUROLOGIC A:   Acute Encephalopathy secondary to sepsis Hx of Fibromyalgia and Chronic pain Vent associated discomfort P:   CT head Negative -8/1 Will hold her chronic pain medication Prn Fentany  FAMILY  - Updates: Mother Vickii Chafe) updated regarding  the patient's critically ill condition and confirmed code status.  Patient Remains Full code at this time    Bincy Varughese,AG-ACNP Pulmonary and [place]   06/16/2017, 11:01 PM   PCCM ATTENDING ATTESTATION:  I have evaluated patient with the APP Varughese, reviewed database in its entirety and discussed care plan in detail. In addition, this patient was discussed on multidisciplinary rounds.   Important exam findings: Moribund Hypotensive despite 4 vasopressors Pupils dilated and fixed No spont movement or response to pain Few spontaneous respiratory efforts   Major problems addressed by PCCM team: Refractory severe sepsis/septic shock - likely abdominal source Acute respiratory failure AKI Bacteremia  S/P cardiac arrest X 2  There is no reasonable hope of any type of survival @ this point  PLAN/REC: Made DNR Plan W/D of support when family arrives  Merton Border, MD PCCM  service Mobile [phone] Pager [phone] 07-03-2017 9:33 AM

## 2017-06-07 NOTE — ED Notes (Signed)
Patient now responding and communicating verbally. Able to follow commands and state name, birthday, and location

## 2017-06-07 NOTE — ED Provider Notes (Signed)
-----------------------------------------   6:05 PM on 06/13/2017 -----------------------------------------  I was asked by Dr. Jacqualine Code to help with this patient. Specifically I was asked to try to help gain central access while he discussed with consultants. I did attempt a right IJ using ultrasound guidance. I was able to access the vein however the wire would not thread. I then tried one ultrasound-guided subclavian. Was not able to gain access. Did place left leg IO. Patient has some wounds to the knee but no overlying cellulitis over proximal tibia.    Nance Pear, MD June 30, 2017 [phone]
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
## 2017-06-07 NOTE — Progress Notes (Signed)
Called by the radiologist and patient's right lower extremity ultrasound is consistent with a DVT. Patient is already on Eliquis which is currently on hold given the patient's shock and anemia. -Patient is already status post IVC filter placement in the past.  Unclear if this is a new DVT as there is no previous study to compare with.

## 2017-06-07 NOTE — H&P (Addendum)
[facility] [place] NAME: Lindsay [name]    MR#:  [id number]  DATE OF BIRTH:  11/27/1955  DATE OF ADMISSION:  06/12/2017  PRIMARY CARE PHYSICIAN: System, Pcp Not In   REQUESTING/REFERRING PHYSICIAN: Dr. Delman Kitten  CHIEF COMPLAINT:   Chief Complaint  Patient presents with  . Altered Mental Status    HISTORY OF PRESENT ILLNESS:  Lindsay [name]  is a 61 y.o. female with a known history of Breast cancer, history of a gastric bypass, history of previous DVT status post IVC filter, fibromyalgia, depression, chronic systolic CHF, proximal atrial fibrillation, peripheral neuropathy, severe malnutrition who presents to the hospital due to altered mental status. Patient was recently admitted to the hospital and noted to have a left lower extremity DVT started on a Eliquis. She was also significantly anemic and transfusion was recommended prior to discharge but she refused. She is already has a IVC filter in place. She was seen by vascular surgeon in a previous hospitalization who did not recommend thrombectomy. She now presents from a skilled nursing facility due to altered mental status and noted to be in shock with severely hypotension and tachycardia. It is unclear presently with the patient has septic versus hypovolemic shock. Patient presently is a poor historian and lethargic secondary to her severe encephalopathy.  PAST MEDICAL HISTORY:   Past Medical History:  Diagnosis Date  . Anemia of chronic disease   . C. difficile diarrhea 10/2011  . Cancer ([place])   . Chronic cough   . Chronic systolic CHF (congestive heart failure) ([place])    a. echo 05/2015: EF 45-50%, nl wall motion, mod MR, nl PASP  . Depression   . Depression   . Fibromyalgia   . History of DVT (deep vein thrombosis)    a. s/p IVC filter 2014  . HX: breast cancer    a. right-side; b. s/p lumpectomy & chemo w/ Docetaxol, cytoxan, adriamycin  . Kidney stones 2013  . Malnutrition ([place])     a. chronic diarrhea s/p most meals   . Moderate mitral regurgitation   . Muscle spasm   . NICM (nonischemic cardiomyopathy) ([place])    a. Lexiscan 08/2015: no sig ischemia, no ECG changes concerning for ischemia (baseline abnormal T wave along anterolateral leads), EF 61%, low risk scan  . Orthostatic hypotension   . PAF (paroxysmal atrial fibrillation) ([place])    a. not on long term, full-dose anticoagulation since 2011, had high INR at that time 2/2 possible malnutition, has been off anticoagulation since; b. CHADS2VASc at least 2 (CHF, female); c. 48 hr Holter 09/2015 NSR w/ rare PACs, no significant PVCs, periods of sinus tachycarida with average heart rate typically in the 60-100 range. No sig arrhythmia. No Afib seen.   . 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 Postural hypotension   . S/P gastric bypass   . Vitamin D deficiency     PAST SURGICAL HISTORY:   Past Surgical History:  Procedure Laterality Date  . ABDOMINAL HYSTERECTOMY    . BREAST LUMPECTOMY     right @ [facility]  . ESOPHAGOGASTRODUODENOSCOPY (EGD) WITH PROPOFOL N/A 05/25/2015   Procedure: ESOPHAGOGASTRODUODENOSCOPY (EGD) WITH PROPOFOL;  Surgeon: Manya Silvas, MD;  Location: [facility] ENDOSCOPY;  Service: Endoscopy;  Laterality: N/A;  . LITHOTRIPSY  jan 2013    SOCIAL HISTORY:   Social History  Substance Use Topics  . Smoking status: Former Smoker    Packs/day: 0.25  Years: 1.00    Types: Cigarettes    Quit date: 11/10/1985  . Smokeless tobacco: Never Used  . Alcohol use No    FAMILY HISTORY:   Family History  Problem Relation Age of Onset  . CAD Father        MI  . Diabetes Mellitus II Father   . Hypertension Father   . Diabetes Mellitus II Sister     DRUG ALLERGIES:   Allergies  Allergen Reactions  . Shrimp [Shellfish Allergy] Anaphylaxis    REVIEW OF SYSTEMS:   Review of Systems  Unable to perform ROS: Mental acuity    MEDICATIONS AT HOME:   Prior to Admission medications    Medication Sig Start Date End Date Taking? Authorizing Provider  Cholecalciferol (VITAMIN D3) 1000 units CAPS Take 1,000 Units by mouth daily.   Yes [provider]  clonazePAM (KLONOPIN) 0.5 MG tablet Take 1 tablet (0.5 mg total) by mouth 2 (two) times daily as needed for anxiety. 06/05/17  Yes Gladstone Lighter, MD  fluticasone (FLONASE) 50 MCG/ACT nasal spray Place 1 spray into the nose daily.    Yes [provider]  lidocaine (LIDODERM) 5 % Place 1-3 patches onto the skin daily. Remove & Discard patch within 12 hours or as directed by MD    Yes [provider]  loperamide (IMODIUM) 2 MG capsule Take 2 mg by mouth 4 (four) times daily as needed for diarrhea or loose stools.   Yes [provider]  loratadine (CLARITIN) 10 MG tablet Take 10 mg by mouth daily as needed for allergies.    Yes [provider]  morphine (MS CONTIN) 15 MG 12 hr tablet Take 1 tablet (15 mg total) by mouth every 8 (eight) hours. 06/05/17  Yes Gladstone Lighter, MD  Multiple Vitamins-Minerals (THEREMS M PO) Take 1 tablet by mouth daily.   Yes [provider]  omeprazole (PRILOSEC) 20 MG capsule Take 20 mg by mouth daily.  07/07/16 07/07/17 Yes [provider]  oxyCODONE (ROXICODONE) 15 MG immediate release tablet Take 1 tablet (15 mg total) by mouth every 6 (six) hours as needed for pain (moderate and severe). 06/05/17  Yes Gladstone Lighter, MD  tiZANidine (ZANAFLEX) 4 MG tablet Take 4 mg by mouth every 6 (six) hours as needed for muscle spasms.   Yes [provider]  triamcinolone cream (KENALOG) 0.1 % Apply 1 application topically 2 (two) times daily.   Yes [provider]  trolamine salicylate (ASPERCREME) 10 % cream Apply 1 application topically as needed for muscle pain.   Yes [provider]  vitamin B-12 (CYANOCOBALAMIN) 1000 MCG tablet Take 1,000 mcg by mouth daily.    Yes [provider]  vitamin C (ASCORBIC ACID) 500  MG tablet Take 500 mg by mouth daily.   Yes [provider]  acidophilus (RISAQUAD) CAPS capsule Take 1 capsule by mouth daily. Patient not taking: Reported on 06/19/2017 06/06/17   Gladstone Lighter, MD  anastrozole (ARIMIDEX) 1 MG tablet Take 1 mg by mouth daily.      [provider]  apixaban (ELIQUIS) 5 MG TABS tablet Take 2 tablets (10 mg total) by mouth 2 (two) times daily. Patient not taking: Reported on 06/17/2017 06/05/17 2017/06/28  Gladstone Lighter, MD  apixaban (ELIQUIS) 5 MG TABS tablet Take 1 tablet (5 mg total) by mouth 2 (two) times daily. Please start after finishing the first week of 2 tablets twice a dose- that is from 06/09/17 Patient not taking: Reported on  07/07/2017 06/09/17   Gladstone Lighter, MD  feeding supplement, ENSURE ENLIVE, (ENSURE ENLIVE) LIQD Take 237 mLs by mouth 2 (two) times daily between meals. 06/05/17   Gladstone Lighter, MD  flecainide (TAMBOCOR) 100 MG tablet Take 0.5 tablets (50 mg total) by mouth every 12 (twelve) hours. Patient not taking: Reported on 07/04/2017 08/27/15   Fritzi Mandes, MD  fludrocortisone (FLORINEF) 0.1 MG tablet Take 0.2 mg by mouth daily.    [provider]  furosemide (LASIX) 20 MG tablet Take 20 mg by mouth daily.    [provider]  midodrine (PROAMATINE) 10 MG tablet Take 0.5-1 tablets (5-10 mg total) by mouth 3 (three) times daily with meals. Take 10 mg in the morning and 5 mg at noon and supper Patient not taking: Reported on 06/21/2017 06/05/17   Gladstone Lighter, MD  mirtazapine (REMERON) 30 MG tablet Take 1 tablet (30 mg total) by mouth at bedtime. Patient not taking: Reported on 06/12/2017 04/01/17   Gladstone Lighter, MD  potassium chloride (K-DUR) 10 MEQ tablet Take 20 mEq by mouth 2 (two) times daily.    [provider]  pregabalin (LYRICA) 150 MG capsule Take 1 capsule (150 mg total) by mouth 2 (two) times daily. 04/01/17   Gladstone Lighter, MD  sertraline (ZOLOFT) 100 MG tablet Take 150 mg  by mouth daily.     [provider]  vancomycin (VANCOCIN) 50 mg/mL oral solution Take 2.5 mLs (125 mg total) by mouth every 6 (six) hours. X 10 days Patient not taking: Reported on 06/09/2017 06/05/17   Gladstone Lighter, MD      VITAL SIGNS:  Blood pressure (!) 89/72, pulse (!) 137, temperature (!) 96.3 F (35.7 C), temperature source Core (Comment), resp. rate (!) 23, height 5\' 6"  (1.676 m), weight 53.5 kg (118 lb), SpO2 100 %.  PHYSICAL EXAMINATION:  Physical Exam  GENERAL:  61 y.o.-year-old patient lying in the bed Lethargic/encephalopathic. EYES: Pupils equal, round, reactive to light. No scleral icterus. Extraocular muscles intact.  HEENT: Head atraumatic, normocephalic. Oropharynx and nasopharynx clear. No oropharyngeal erythema, dry oral mucosa NECK:  Supple, no jugular venous distention. No thyroid enlargement, no tenderness.  LUNGS: Normal breath sounds bilaterally, no wheezing, rales, rhonchi. No use of accessory muscles of respiration.  CARDIOVASCULAR: S1, S2 RRR, tachycardic. No murmurs, rubs, gallops, clicks.  ABDOMEN: Soft, nontender, nondistended. Bowel sounds present. No organomegaly or mass.  EXTREMITIES: No pedal edema, cyanosis, or clubbing. + 2 pedal & radial pulses b/l.  Bilateral lower extremity intraosseous lines in place. NEUROLOGIC: Cranial nerves II through XII are intact. No focal Motor or sensory deficits appreciated b/l. Globally weak and encephalopathic.  PSYCHIATRIC: The patient is alert and oriented x 1. Globally weak & Encephalopathic.   SKIN: No obvious rash, lesion, or ulcer.   LABORATORY PANEL:   CBC  Recent Labs Lab 06/15/2017 1456  WBC 2.1*  HGB 6.6*  HCT 21.4*  PLT 257   ------------------------------------------------------------------------------------------------------------------  Chemistries   Recent Labs Lab 06/30/2017 1456  NA 142  K 3.6  CL 115*  CO2 17*  GLUCOSE 223*  BUN 21*  CREATININE 1.78*  CALCIUM 7.7*  AST  49*  ALT 16  ALKPHOS 186*  BILITOT 1.0   ------------------------------------------------------------------------------------------------------------------  Cardiac Enzymes  Recent Labs Lab 06/24/2017 1456  TROPONINI <0.03   ------------------------------------------------------------------------------------------------------------------  RADIOLOGY:  Dg Chest Portable 1 View  Result Date: 06/07/2017 CLINICAL DATA:  Hypoxia.  Failed central line placement EXAM: PORTABLE CHEST 1 VIEW COMPARISON:  None. FINDINGS: Heart is  normal size. No pneumothorax after attempted central line placement. No confluent opacities or effusions. No acute bony abnormality. IMPRESSION: No active disease. Electronically Signed   By: Rolm Baptise M.D.   On: 07/03/2017 17:57   Dg Chest Port 1 View  Result Date: 06/16/2017 CLINICAL DATA:  Intubation.  Altered mental status EXAM: PORTABLE CHEST 1 VIEW COMPARISON:  06/01/2017 FINDINGS: Normal heart size and mediastinal contours. Low volume chest. There is no edema, consolidation, effusion, or pneumothorax. There is artifact from EKG leads and defibrillator pads. I spoke with the patient's nurse at time of dictation, contrary to the history the patient has not been intubated. Postsurgical changes at the right axilla and central diaphragm. IMPRESSION: Low volume chest without acute finding. Electronically Signed   By: Monte Fantasia M.D.   On: 06/07/2017 15:25     IMPRESSION AND PLAN:   61 year old female with past medical history of  Breast cancer, history of a gastric bypass, history of previous DVT status post IVC filter, fibromyalgia, depression, chronic systolic CHF, proximal atrial fibrillation, peripheral neuropathy, severe malnutrition who presents to the hospital due to altered mental status.   1. Altered mental status-this is metabolic encephalopathy secondary to shock. Unclear if this is septic versus hypovolemic shock. -Patient follows simple commands.  Awaiting CT head. -Continue treat severe shock with IV fluids, blood transfusions, vasopressors, continue empiric IV antibiotics and follow mental status.  2. Shock-unclear this is hypovolemic versus septic shock. Patient's urinalysis is positive for UTI. - will start broad spectrum IV Abx for with Vanc, Zosyn.  Follow cultures.   -I will also continue oral vancomycin for her recent history of C. difficile colitis. -Patient is significantly anemic 2 and therefore will get IV fluids and also blood transfusion will follow hemoglobin. -Continue vasopressors, continue IV fluids, try to keep mean arterial pressure greater than 50. - Will place on Stress dose steroids due to her being on Florinef.    3. Anemia-this is acute on chronic anemia. The source of the anemia still unclear. -No evidence of acute bleeding, we'll transfuse 2 units and follow hemoglobin.  4. Urinary tract infection-Place on IV Zosyn, follow urine cultures.  5. Recent history of C. difficile colitis-continue oral vancomycin.   6. Acute kidney injury-secondary to hypotension and 6 shock and likely ATN.  -continue IV fluids, vasopressors, follow BUN/creatinine urine output. Renal dosed meds, avoid nephrotoxins.  7. History of fibromyalgia and chronic pain-hold her chronic pain medications given her altered mental status and severe hypotension.  8. History of left lower extremity DVT-patient is status post an IVC filter. Patient was on Eliquis which I will hold for now given her acute anemia and shock.  9. History of paroxysmal atrial fibrillation-currently tachycardic due to shock. Can resume flecainide once more stable. Patient is already on Eliquis.  10. Suspected copper deficiency-I was called by the pathology lab and when they looked at the patient's smear she had signs consistent with possible copper deficiency. Copper level as of 03/25/2017 was significantly low. -Patient's with previous history of lap gastric bypass  which she has had a risk for copper deficiency. -I will get dietary to help with copper supplementation.    Patient's overall prognosis is very poor given her multiple comorbidities. I will get a palliative care consult to discuss goals of care.  All the records are reviewed and case discussed with ED provider. Management plans discussed with the patient, family and they are in agreement.  CODE STATUS: Full code  TOTAL Critical Care  TIME TAKING CARE OF THIS PATIENT: 50 minutes.    Henreitta Leber M.D on 06/20/2017 at 6:22 PM  Between 7am to 6pm - Pager - [phone]  After 6pm go to [email or web address] - password [email or web address] [place] [facility]  Office  [phone]  CC: Primary care physician; System, Pcp Not In

## 2017-06-07 NOTE — ED Triage Notes (Signed)
Patient arrived from [place] for unresponsiveness X2 days. Per family patient baseline is able to speak and ask questions. Patient is unresponsive upon arrival. EMS vitals HR 147, RR 27, C02 17, O2 87 5L, b/p 63/33, Blood sugar 45 after IM glucagon per EMS.

## 2017-06-07 NOTE — Progress Notes (Signed)
eLink Physician-Brief Progress Note Patient Name: Lindsay [name] DOB: October 23, 1956 MRN: [id number]   Date of Service  06/14/2017  HPI/Events of Note  New patient evaluation  eICU Interventions  Will defer to bedside MD     Intervention Category Major Interventions: Other:  Kyrin Garn 06/25/2017, 10:02 PM

## 2017-06-07 NOTE — Progress Notes (Addendum)
ANTICOAGULATION CONSULT NOTE - Initial Consult  Pharmacy Consult for heparin  Indication: h/o DVT s/p IVC filter and h/o afib  Allergies  Allergen Reactions  . Shrimp [Shellfish Allergy] Anaphylaxis    Patient Measurements: Height: 5\' 6"  (167.6 cm) Weight: 118 lb (53.5 kg) IBW/kg (Calculated) : 59.3 Heparin Dosing Weight: 53.5 kg  Vital Signs: Temp: 96.1 F (35.6 C) (08/01 2100) Temp Source: Core (Comment) (08/01 2100) BP: 73/51 (08/01 2100) Pulse Rate: 127 (08/01 2100)  Labs:  Recent Labs  06/05/17 0401 06/21/2017 1456  HGB 6.9* 6.6*  HCT 21.5* 21.4*  PLT 251 257  LABPROT  --  34.8*  INR  --  3.36  CREATININE 1.11* 1.78*  CKTOTAL  --  79  TROPONINI  --  <0.03    Estimated Creatinine Clearance: 29.1 mL/min (A) (by C-G formula based on SCr of 1.78 mg/dL (H)).   Medical History: Past Medical History:  Diagnosis Date  . Anemia of chronic disease   . C. difficile diarrhea 10/2011  . Cancer ([place])   . Chronic cough   . Chronic systolic CHF (congestive heart failure) ([place])    a. echo 05/2015: EF 45-50%, nl wall motion, mod MR, nl PASP  . Depression   . Depression   . Fibromyalgia   . History of DVT (deep vein thrombosis)    a. s/p IVC filter 2014  . HX: breast cancer    a. right-side; b. s/p lumpectomy & chemo w/ Docetaxol, cytoxan, adriamycin  . Kidney stones 2013  . Malnutrition ([place])    a. chronic diarrhea s/p most meals   . Moderate mitral regurgitation   . Muscle spasm   . NICM (nonischemic cardiomyopathy) ([place])    a. Lexiscan 08/2015: no sig ischemia, no ECG changes concerning for ischemia (baseline abnormal T wave along anterolateral leads), EF 61%, low risk scan  . Orthostatic hypotension   . PAF (paroxysmal atrial fibrillation) ([place])    a. not on long term, full-dose anticoagulation since 2011, had high INR at that time 2/2 possible malnutition, has been off anticoagulation since; b. CHADS2VASc at least 2 (CHF, female); c. 48 hr Holter 09/2015 NSR w/  rare PACs, no significant PVCs, periods of sinus tachycarida with average heart rate typically in the 60-100 range. No sig arrhythmia. No Afib seen.   . 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 Postural hypotension   . S/P gastric bypass   . Vitamin D deficiency     Medications:  Scheduled:  . hydrocortisone sod succinate (SOLU-CORTEF) inj  50 mg Intravenous Q6H  . [START ON 07-07-2017] vancomycin  125 mg Oral Q6H    Assessment: Patient admitted for AMS found to be in sepsis s/t UTI. Patient has h/o DVT s/p IVC filter placement and h/o of afib supposedly takes eliquis as PTA med, but has not taken any eliquis per med rec.  Goal of Therapy:  Heparin level 0.3-0.7 units/ml Monitor platelets by anticoagulation protocol: Yes   Plan:  Considering patient has received a dose of eliquis 10 mg @ 1024 on 7/30 and has a PT/INR of 34.28/3.38 and Hgb of 6.6 requiring a blood transfusion will NOT bolus. Will start heparin drip @ 950 units/hr and will check HL/aPTT @ 0400. Baseline labs ordered.  Tobie Lords, PharmD, BCPS Clinical Pharmacist 06/25/2017

## 2017-06-07 NOTE — ED Notes (Signed)
Blood sugar 129

## 2017-06-08 ENCOUNTER — Inpatient Hospital Stay: Payer: Medicare Other

## 2017-06-08 LAB — CBC
HEMATOCRIT: 25.7 % — AB (ref 35.0–47.0)
HEMOGLOBIN: 7.3 g/dL — AB (ref 12.0–16.0)
MCH: 30.9 pg (ref 26.0–34.0)
MCHC: 28.4 g/dL — AB (ref 32.0–36.0)
MCV: 108.8 fL — ABNORMAL HIGH (ref 80.0–100.0)
Platelets: 71 10*3/uL — ABNORMAL LOW (ref 150–440)
RBC: 2.36 MIL/uL — ABNORMAL LOW (ref 3.80–5.20)
RDW: 25.7 % — ABNORMAL HIGH (ref 11.5–14.5)
WBC: 1.3 10*3/uL — CL (ref 3.6–11.0)

## 2017-06-08 LAB — BLOOD CULTURE ID PANEL (REFLEXED)
Acinetobacter baumannii: NOT DETECTED
CANDIDA GLABRATA: NOT DETECTED
CANDIDA TROPICALIS: NOT DETECTED
CARBAPENEM RESISTANCE: NOT DETECTED
Candida albicans: NOT DETECTED
Candida krusei: NOT DETECTED
Candida parapsilosis: NOT DETECTED
ESCHERICHIA COLI: DETECTED — AB
Enterobacter cloacae complex: NOT DETECTED
Enterobacteriaceae species: DETECTED — AB
Enterococcus species: NOT DETECTED
Haemophilus influenzae: NOT DETECTED
Klebsiella oxytoca: NOT DETECTED
Klebsiella pneumoniae: NOT DETECTED
LISTERIA MONOCYTOGENES: NOT DETECTED
NEISSERIA MENINGITIDIS: NOT DETECTED
PROTEUS SPECIES: NOT DETECTED
Pseudomonas aeruginosa: NOT DETECTED
SERRATIA MARCESCENS: NOT DETECTED
STAPHYLOCOCCUS AUREUS BCID: NOT DETECTED
STAPHYLOCOCCUS SPECIES: NOT DETECTED
STREPTOCOCCUS PNEUMONIAE: NOT DETECTED
Streptococcus agalactiae: NOT DETECTED
Streptococcus pyogenes: NOT DETECTED
Streptococcus species: NOT DETECTED

## 2017-06-08 LAB — BASIC METABOLIC PANEL
ANION GAP: 22 — AB (ref 5–15)
BUN: 19 mg/dL (ref 6–20)
CHLORIDE: 118 mmol/L — AB (ref 101–111)
CO2: 7 mmol/L — AB (ref 22–32)
Calcium: 7.2 mg/dL — ABNORMAL LOW (ref 8.9–10.3)
Creatinine, Ser: 1.66 mg/dL — ABNORMAL HIGH (ref 0.44–1.00)
GFR calc Af Amer: 38 mL/min — ABNORMAL LOW (ref >60)
GFR, EST NON AFRICAN AMERICAN: 33 mL/min — AB (ref >60)
GLUCOSE: 111 mg/dL — AB (ref 65–99)
POTASSIUM: 4.9 mmol/L (ref 3.5–5.1)
Sodium: 147 mmol/L — ABNORMAL HIGH (ref 135–145)

## 2017-06-08 LAB — HEPARIN LEVEL (UNFRACTIONATED)
HEPARIN UNFRACTIONATED: 2.02 [IU]/mL — AB (ref 0.30–0.70)
Heparin Unfractionated: 2.92 IU/mL — ABNORMAL HIGH (ref 0.30–0.70)

## 2017-06-08 LAB — PROCALCITONIN: Procalcitonin: 12.21 ng/mL

## 2017-06-08 LAB — APTT

## 2017-06-08 LAB — GLUCOSE, CAPILLARY
GLUCOSE-CAPILLARY: 66 mg/dL (ref 65–99)
GLUCOSE-CAPILLARY: 114 mg/dL — AB (ref 65–99)
GLUCOSE-CAPILLARY: 79 mg/dL (ref 65–99)
Glucose-Capillary: 81 mg/dL (ref 65–99)

## 2017-06-08 LAB — FIBRINOGEN: Fibrinogen: 151 mg/dL — ABNORMAL LOW (ref 210–475)

## 2017-06-08 MED ORDER — SODIUM CHLORIDE 0.9 % IV SOLN
1.0000 g | Freq: Two times a day (BID) | INTRAVENOUS | Status: DC
  Administered 2017-06-08: 1 g via INTRAVENOUS
  Filled 2017-06-08 (×3): qty 1

## 2017-06-08 MED ORDER — METRONIDAZOLE IN NACL 5-0.79 MG/ML-% IV SOLN
500.0000 mg | Freq: Three times a day (TID) | INTRAVENOUS | Status: DC
  Administered 2017-06-08: 500 mg via INTRAVENOUS
  Filled 2017-06-08 (×4): qty 100

## 2017-06-08 MED ORDER — VASOPRESSIN 20 UNIT/ML IV SOLN
0.0400 [IU]/min | INTRAVENOUS | Status: DC
  Administered 2017-06-08: 0.04 [IU]/min via INTRAVENOUS
  Filled 2017-06-08 (×2): qty 2

## 2017-06-08 MED ORDER — VANCOMYCIN 50 MG/ML ORAL SOLUTION
500.0000 mg | Freq: Four times a day (QID) | ORAL | Status: DC
  Filled 2017-06-08 (×2): qty 10

## 2017-06-08 MED ORDER — SODIUM BICARBONATE 8.4 % IV SOLN
50.0000 meq | Freq: Once | INTRAVENOUS | Status: AC
Start: 2017-06-08 — End: 2017-06-08
  Administered 2017-06-08: 50 meq via INTRAVENOUS

## 2017-06-08 MED ORDER — EPINEPHRINE PF 1 MG/ML IJ SOLN
0.5000 ug/min | INTRAVENOUS | Status: DC
  Filled 2017-06-08: qty 4

## 2017-06-08 MED ORDER — ROCURONIUM BROMIDE 50 MG/5ML IV SOLN
INTRAVENOUS | Status: AC
  Filled 2017-06-08: qty 1

## 2017-06-08 MED ORDER — SODIUM BICARBONATE 8.4 % IV SOLN
INTRAVENOUS | Status: DC
  Administered 2017-06-08 (×2): via INTRAVENOUS
  Filled 2017-06-08 (×5): qty 150

## 2017-06-08 MED ORDER — SODIUM BICARBONATE 8.4 % IV SOLN
50.0000 meq | Freq: Once | INTRAVENOUS | Status: AC
  Administered 2017-06-08: 50 meq via INTRAVENOUS

## 2017-06-08 MED ORDER — SODIUM CHLORIDE 0.9 % IR SOLN
500.0000 mg | Freq: Four times a day (QID) | Status: DC
  Filled 2017-06-08 (×3): qty 500

## 2017-06-08 MED ORDER — ETOMIDATE 2 MG/ML IV SOLN
INTRAVENOUS | Status: AC
  Filled 2017-06-08: qty 10

## 2017-06-08 MED ORDER — SODIUM CHLORIDE 0.9 % IV SOLN
1.9000 mL/h | INTRAVENOUS | Status: DC
  Administered 2017-06-08: 50 mL/h via INTRAVENOUS
  Administered 2017-06-08: 20 mL/h via INTRAVENOUS
  Filled 2017-06-08 (×2): qty 250

## 2017-06-08 MED ORDER — SODIUM CHLORIDE 0.9 % IV BOLUS (SEPSIS)
1000.0000 mL | Freq: Once | INTRAVENOUS | Status: AC
  Administered 2017-06-08: 1000 mL via INTRAVENOUS

## 2017-06-08 MED ORDER — EPINEPHRINE PF 1 MG/ML IJ SOLN
0.5000 ug/min | INTRAMUSCULAR | Status: DC
  Filled 2017-06-08 (×2): qty 4

## 2017-06-08 MED ORDER — SODIUM BICARBONATE 8.4 % IV SOLN
INTRAVENOUS | Status: AC
  Administered 2017-06-08: 50 meq
  Filled 2017-06-08: qty 200

## 2017-06-08 MED ORDER — DEXTROSE 50 % IV SOLN
INTRAVENOUS | Status: AC
  Filled 2017-06-08: qty 50

## 2017-06-08 MED ORDER — DEXTROSE 50 % IV SOLN
50.0000 mL | Freq: Once | INTRAVENOUS | Status: AC
  Administered 2017-06-08: 50 mL via INTRAVENOUS

## 2017-06-08 MED ORDER — FENTANYL CITRATE (PF) 100 MCG/2ML IJ SOLN: 100.0000 ug | INTRAMUSCULAR | Status: DC | PRN

## 2017-06-08 MED FILL — Medication: Qty: 1 | Status: AC

## 2017-06-09 LAB — TYPE AND SCREEN
ABO/RH(D): O POS
Antibody Screen: NEGATIVE
Unit division: 0
Unit division: 0

## 2017-06-09 LAB — BPAM RBC
BLOOD PRODUCT EXPIRATION DATE: 201808082359
Blood Product Expiration Date: 201808082359
ISSUE DATE / TIME: 201808011808
UNIT TYPE AND RH: 9500
Unit Type and Rh: 9500

## 2017-06-10 LAB — CULTURE, BLOOD (ROUTINE X 2)
SPECIAL REQUESTS: ADEQUATE
Special Requests: ADEQUATE

## 2017-06-10 LAB — URINE CULTURE

## 2017-06-13 LAB — BLOOD GAS, ARTERIAL
Bicarbonate: UNDETERMINED mmol/L (ref 20.0–28.0)
Bicarbonate: UNDETERMINED mmol/L (ref 20.0–28.0)
FIO2: 0.44
FIO2: 1
MECHANICAL RATE: 16
O2 Saturation: UNDETERMINED %
PATIENT TEMPERATURE: 37
PCO2 ART: 19 mmHg — AB (ref 32.0–48.0)
PEEP: 5 cmH2O
PO2 ART: 141 mmHg — AB (ref 83.0–108.0)
Patient temperature: 37
VT: 450 mL
pCO2 arterial: 50 mmHg — ABNORMAL HIGH (ref 32.0–48.0)
pH, Arterial: <6.9 — CL (ref 7.350–7.450)
pO2, Arterial: 33 mmHg — CL (ref 83.0–108.0)

## 2017-06-21 ENCOUNTER — Telehealth: Payer: Self-pay | Admitting: Pulmonary Disease

## 2017-06-21 NOTE — Telephone Encounter (Signed)
Death cert placed in DS' folder to be signed.  

## 2017-06-21 NOTE — Telephone Encounter (Signed)
[place] dropped off death certificate to be signed Placed in Nurse Box

## 2017-06-23 NOTE — Telephone Encounter (Signed)
Informed funeral home death cert is ready for pick up. Nothing further needed. 

## 2017-07-08 NOTE — Significant Event (Signed)
Pt asystole at 1041, pronounced by RNs Dena Billet and Myra Flowers per order. No family present at this time. Chaplain paged to come to bedside for family support upon arrival.

## 2017-07-08 NOTE — Discharge Summary (Signed)
DEATH SUMMARY  DATE OF ADMISSION:  2017/06/09  DATE OF DISCHARGE/DEATH:  2017/06/10  ADMISSION DIAGNOSES:   Shock, unclear etiology - hypovolemic versus septic Acute encephalopathy Acute on chronic anemia Urinary tract infection Recent C. difficile colitis AKI History of fibromyalgia, chronic pain History of left lower extremity DVT History of paroxysmal atrial fibrillation   DISCHARGE DIAGNOSES:   Refractory septic shock, unclear etiology - likely abdominal source Escherichia coli bacteremia Cardiac arrest 2 Acute encephalopathy Acute on chronic anemia Urinary tract infection Recent C. difficile colitis AKI, anuric Poor venous access History of fibromyalgia, chronic pain History of left lower extremity DVT History of paroxysmal atrial fibrillation   PRESENTATION:   Pt was admitted with the following HPI and the above admission diagnoses:  Lindsay [name]  is a 61 y.o. female with a known history of Breast cancer, history of a gastric bypass, history of previous DVT status post IVC filter, fibromyalgia, depression, chronic systolic CHF, proximal atrial fibrillation, peripheral neuropathy, severe malnutrition who presents to the hospital due to altered mental status. Patient was recently admitted to the hospital and noted to have a left lower extremity DVT started on a Eliquis. She was also significantly anemic and transfusion was recommended prior to discharge but she refused. She is already has a IVC filter in place. She was seen by vascular surgeon in a previous hospitalization who did not recommend thrombectomy. She now presents from a skilled nursing facility due to altered mental status and noted to be in shock with severely hypotension and tachycardia. It is unclear presently with the patient has septic versus hypovolemic shock. Patient presently is a poor historian and lethargic secondary to her severe encephalopathy.  HOSPITAL COURSE:   She was admitted to the intensive care  unit for vasopressors and broad-spectrum antibiotics. Her blood pressure never stabilized. She suffered cardiac arrest and was intubated during the course of ACLS. By the morning following admission she was on maximum doses of 4 different vasopressors with persistent shock. Dr Alva Garnet spoke with the family and informed them that maximum therapy had been undertaken and there was nothing further to offer. She was made DNR on the basis of medical futility. She suffered progressive hypotension, shock, bradycardia, asystole and was announced that on the morning of 2017-06-10    Cause of death:  Septic shock Contributing factors: Acute respiratory failure, AKI Autopsy:  No Smoking: Unknown   Merton Border, MD PCCM service Mobile [phone] Pager [phone] 06/09/2017 11:19 AM

## 2017-07-08 NOTE — ED Provider Notes (Signed)
[facility] Department of Emergency Medicine   Code Blue CONSULT NOTE  Chief Complaint: Cardiac arrest/unresponsive   Level V Caveat: Unresponsive  History of present illness: I was contacted by the hospital for a CODE BLUE cardiac arrest upstairs and presented to the [name]'s bedside. Lindsay [name] was admitted today for altered mental status with severe sepsis and evidence of profound lactic acidosis requiring multiple vasopressors. On arrival [name] is intubated and being bagged. Compressions being performed at this time. Sodium bicarbonate administered. [name] received epinephrine through the interosseous line. Based on return of vital signs, labs and presentation, it appears [name] has gone into profound DIC with worsening hypotension and shock.  ROS: Unable to obtain, Level V caveat  Scheduled Meds: . dextrose      . etomidate      . hydrocortisone sod succinate (SOLU-CORTEF) inj  50 mg Intravenous Q6H  . rocuronium      . vancomycin  500 mg Oral Q6H  . vancomycin (VANCOCIN) rectal ENEMA  500 mg Rectal Q6H   Continuous Infusions: . sodium chloride    . sodium chloride 75 mL/hr at 06/28/2017 0200  . epinephrine    . heparin 950 Units/hr (06-28-2017 0200)  . meropenem (MERREM) IV 1 g (06-28-17 0350)  . metronidazole Stopped (06/28/17 0239)  . norepinephrine (LEVOPHED) Adult infusion 300 mcg/min (2017/06/28 0307)  . phenylephrine (NEO-SYNEPHRINE) Adult infusion 400 mcg/min (June 28, 2017 0301)  .  sodium bicarbonate  infusion 1000 mL 125 mL/hr at 06-28-2017 0200  . vancomycin    . vasopressin (PITRESSIN) infusion - *FOR SHOCK* 0.04 Units/min (Jun 28, 2017 0200)   PRN Meds:.acetaminophen **OR** acetaminophen, fentaNYL (SUBLIMAZE) injection, fentaNYL (SUBLIMAZE) injection, iopamidol, ondansetron **OR** ondansetron (ZOFRAN) IV Past Medical History:  Diagnosis Date  . Anemia of chronic disease   . C. difficile diarrhea 10/2011  . Cancer ([place])   . Chronic cough   .  Chronic systolic CHF (congestive heart failure) ([place])    a. echo 05/2015: EF 45-50%, nl wall motion, mod MR, nl PASP  . Depression   . Depression   . Fibromyalgia   . History of DVT (deep vein thrombosis)    a. s/p IVC filter 2014  . HX: breast cancer    a. right-side; b. s/p lumpectomy & chemo w/ Docetaxol, cytoxan, adriamycin  . Kidney stones 2013  . Malnutrition ([place])    a. chronic diarrhea s/p most meals   . Moderate mitral regurgitation   . Muscle spasm   . NICM (nonischemic cardiomyopathy) ([place])    a. Lexiscan 08/2015: no sig ischemia, no ECG changes concerning for ischemia (baseline abnormal T wave along anterolateral leads), EF 61%, low risk scan  . Orthostatic hypotension   . PAF (paroxysmal atrial fibrillation) ([place])    a. not on long term, full-dose anticoagulation since 2011, had high INR at that time 2/2 possible malnutition, has been off anticoagulation since; b. CHADS2VASc at least 2 (CHF, female); c. 48 hr Holter 09/2015 NSR w/ rare PACs, no significant PVCs, periods of sinus tachycarida with average heart rate typically in the 60-100 range. No sig arrhythmia. No Afib seen.   . 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 Postural hypotension   . S/P gastric bypass   . Vitamin D deficiency    Past Surgical History:  Procedure Laterality Date  . ABDOMINAL HYSTERECTOMY    . BREAST LUMPECTOMY     right @ [facility]  . ESOPHAGOGASTRODUODENOSCOPY (EGD) WITH PROPOFOL N/A 05/25/2015   Procedure: ESOPHAGOGASTRODUODENOSCOPY (EGD)  WITH PROPOFOL;  Surgeon: Manya Silvas, MD;  Location: [facility] ENDOSCOPY;  Service: Endoscopy;  Laterality: N/A;  . LITHOTRIPSY  jan 2013   Social History   Social History  . Marital status: Widowed    Spouse name: N/A  . Number of children: N/A  . Years of education: N/A   Occupational History  . Not on file.   Social History Main Topics  . Smoking status: Former Smoker    Packs/day: 0.25    Years: 1.00    Types: Cigarettes    Quit  date: 11/10/1985  . Smokeless tobacco: Never Used  . Alcohol use No  . Drug use: No  . Sexual activity: Not on file   Other Topics Concern  . Not on file   Social History Narrative  . No narrative on file   Allergies  Allergen Reactions  . Shrimp [Shellfish Allergy] Anaphylaxis    Last set of Vital Signs (not current) Vitals:   2017-06-14 0200 June 14, 2017 0300  BP: (!) 84/60 (!) 51/36  Pulse:    Resp: (!) 34 (!) 29  Temp:  98.2 F (36.8 C)      Physical Exam  Gen: unresponsive Cardiovascular: pulseless  Resp: apneic. Breath sounds equal bilaterally with bagging  Abd: nondistended  Neuro: GCS 3, unresponsive to pain  HEENT: No blood in posterior pharynx, gag reflex absent  Neck: No crepitus  Musculoskeletal: No deformity  Skin: erythema to bilateral thighs   Medical Decision making    The bedside [name] intubated with CPR in progress. [name] with critical illness with profound shock is unresponsive to a bolus of sodium bicarbonate epinephrine boluses and CPR. Prognosis very poor.  No further assistance requested by ICU team.       Merlyn Lot, MD 2017/06/14 [phone]

## 2017-07-08 NOTE — Progress Notes (Signed)
  Patient lost her pulse.  Started ACLS measures and had ROSC after  9 minutes.  Patient is on multiple pressors with multisystem organ failure due to profound shock.  Discussed with Dr Mortimer Fries and decided that patient is very sick and will focus on stabilizing the patient therefore patient was excluded for TTM criteria post cardiac arrest.  Chellsie Gomer,AG-ACNP Pulmonary & Critical Care

## 2017-07-08 NOTE — Procedures (Signed)
Intubation Procedure Note Lindsay [name] [id number] 1956-02-01  Procedure: Intubation Indications: Respiratory insufficiency  Procedure Details Consent: Unable to obtain consent because of emergent medical necessity. Time Out: Verified patient identification, verified procedure, site/side was marked, verified correct patient position, special equipment/implants available, medications/allergies/relevent history reviewed, required imaging and test results available.  Performed  DL x 7with # 3 blade. Grade 1 view. ET tube visualized passing through vocal cords. Following intubation:  positive color change on ETCO2, condensation seen in endotracheal tube, equal breath sounds bilaterally.  Evaluation Hemodynamic Status: Persistent hypotension treated with pressors and fluid; O2 sats: stable throughout Patient's Current Condition: unstable Complications: No apparent complications Patient did tolerate procedure well. Chest X-ray ordered to verify placement.  CXR: tube position acceptable.   [facility]

## 2017-07-08 NOTE — Progress Notes (Addendum)
Late note 96 Family called and informed of patients decline. Also informed that patient had suffered 2 cardiac arrest last night and would probably suffer an other one shortly. Informed by mother that she Best boy ) was leaving [place] to travel to [place].

## 2017-07-08 NOTE — Progress Notes (Signed)
Spoke with Izora Ribas ME. Not an ME case. Family requested an autopsy. Spoke with [employer]. Cost for family would be $5,500 plus expense of transportation. Hinton Dyer NP for intensivist and myself spoke to the sister who is an Therapist, sports who declined the autopsy.

## 2017-07-08 NOTE — Progress Notes (Signed)
  Patient coded again .  Is not responding to any pressors or NaHCO3.  In severe metabolic acidosis.  It will be futile to continue with the care.  Patient has a weak thready pulse. Will possibly code again.  Attempted to get in touch with the family members however no body is answering the call currently.  [facility]

## 2017-07-08 NOTE — Progress Notes (Signed)
Admitted to ICU, critically ill- sepsis, DVT on heparin drip, multiple pressors overnight, coded twice. Poor prognosis, pupils fixed and dilated. Patient has passed away this morning by the time I went to see her. Signing off.

## 2017-07-08 NOTE — Care Management (Signed)
RNCM has updated [facility] home [facility] of patient's status. APS is involved with this case per CSW notes. Covering CSW also updated on patient's status.

## 2017-07-08 NOTE — Significant Event (Signed)
Progressive shock and subsequent asystole. No further ACLS measures indicated  Merton Border, MD PCCM service Mobile [phone] Pager [phone] 29-Jun-2017 10:41 AM

## 2017-07-08 NOTE — Progress Notes (Signed)
PHARMACY - PHYSICIAN COMMUNICATION CRITICAL VALUE ALERT - BLOOD CULTURE IDENTIFICATION (BCID)  Results for orders placed or performed during the hospital encounter of 06/26/2017  Blood Culture ID Panel (Reflexed) (Collected: 06/28/2017  2:56 PM)  Result Value Ref Range   Enterococcus species NOT DETECTED NOT DETECTED   Listeria monocytogenes NOT DETECTED NOT DETECTED   Staphylococcus species NOT DETECTED NOT DETECTED   Staphylococcus aureus NOT DETECTED NOT DETECTED   Streptococcus species NOT DETECTED NOT DETECTED   Streptococcus agalactiae NOT DETECTED NOT DETECTED   Streptococcus pneumoniae NOT DETECTED NOT DETECTED   Streptococcus pyogenes NOT DETECTED NOT DETECTED   Acinetobacter baumannii NOT DETECTED NOT DETECTED   Enterobacteriaceae species DETECTED (A) NOT DETECTED   Enterobacter cloacae complex NOT DETECTED NOT DETECTED   Escherichia coli DETECTED (A) NOT DETECTED   Klebsiella oxytoca NOT DETECTED NOT DETECTED   Klebsiella pneumoniae NOT DETECTED NOT DETECTED   Proteus species NOT DETECTED NOT DETECTED   Serratia marcescens NOT DETECTED NOT DETECTED   Carbapenem resistance NOT DETECTED NOT DETECTED   Haemophilus influenzae NOT DETECTED NOT DETECTED   Neisseria meningitidis NOT DETECTED NOT DETECTED   Pseudomonas aeruginosa NOT DETECTED NOT DETECTED   Candida albicans NOT DETECTED NOT DETECTED   Candida glabrata NOT DETECTED NOT DETECTED   Candida krusei NOT DETECTED NOT DETECTED   Candida parapsilosis NOT DETECTED NOT DETECTED   Candida tropicalis NOT DETECTED NOT DETECTED    Name of physician (or Provider) Contacted: Bincy Varuginese  Changes to prescribed antibiotics required: Switch zosyn to meropenem 1g IV q12h  Tobie Lords, PharmD, BCPS Clinical Pharmacist 02-Jul-2017

## 2017-07-08 NOTE — Progress Notes (Signed)
Upon arriving at 0700, received handoff from [employer]. She is post codex2 during previous shift. Pt was unresponsive with dilated fixed pupils. Unresponsive to pain or any stimulation with a GCS of 3. She was ventilated with ETT and breathing above the vent. Lung sounds clear and diminished. Her HR was irregular and rate varied from 30-70s. No peripheral pulses palpable or dopplerable, femoral pulses not palpable but able to doppler. Unable to obtain BP or reliable BP. Pt had epinephrine, norepinephrine, vasopressin, phenylephrine, sodium bicarb, and heparin gtts, see MAR for more details. Skin cold, dry, mottled, with cool to touch extremities. Hypothermic per foley catheter probe, treated with warming blanket. Abdomen hard to touch and slightly distended. BLE edematous +3. Unable to assess posterior side d/t pt being too unstable. Consulted with Dr.David Simmonds regarding pt condition and plan of care. Family is not present at this particular time. Night NP Bincy attempted to contact family at [phone] during second code, phone call was not answered.

## 2017-07-08 NOTE — Progress Notes (Signed)
Pharmacy Antibiotic Note  Lindsay [name] is a 61 y.o. female admitted on 07/04/2017 with Fulminant C. diff.  Pharmacy is assisting with PO/PR vanc for possible fulminant C. diff  Plan: Patient has been getting vancomycin po 125 mg q6h. Considering severity of patient's condition recommended to increase po vancomycin to 500 mg q6h. Additionally, will add vanc PR 500 mg q6h and continue metronidazole 500 mg IV q8h.  Height: 5\' 6"  (167.6 cm) Weight: 118 lb (53.5 kg) IBW/kg (Calculated) : 59.3  Temp (24hrs), Avg:97.5 F (36.4 C), Min:95.8 F (35.4 C), Max:99.8 F (37.7 C)   Recent Labs Lab 06/01/17 0121  06/02/17 0456 06/03/17 0355 06/04/17 0322 06/05/17 0401 06/29/2017 1456 06/26/2017 2002 06/29/2017 2312  WBC  --   < > 13.0* 14.5* 13.1* 15.0* 2.1*  --  0.6*  CREATININE  --   --  0.99  --   --  1.11* 1.78*  --   --   LATICACIDVEN 0.9  --   --   --   --   --  5.7* 9.6*  --   < > = values in this interval not displayed.  Estimated Creatinine Clearance: 29.1 mL/min (A) (by C-G formula based on SCr of 1.78 mg/dL (H)).    Allergies  Allergen Reactions  . Shrimp [Shellfish Allergy] Anaphylaxis    Thank you for allowing pharmacy to be a part of this patient's care.  Tobie Lords, PharmD, BCPS Clinical Pharmacist 06-16-17

## 2017-07-08 NOTE — Progress Notes (Signed)
Chaplain received an order to visit with pt in room ICU13. Pt passed away due to shock. Chaplain provided ministry support to the care giver of the pt who was very emotional. Upon arrival of family, Chaplain provided a prayer and grief support.    2017/06/14 1000  Clinical Encounter Type  Visited With Patient;Patient and family together  Visit Type Initial;Spiritual support  Referral From Nurse  Consult/Referral To Chaplain  Spiritual Encounters  Spiritual Needs Prayer;Emotional;Grief support

## 2017-07-08 NOTE — Progress Notes (Signed)
Pt status changed from Full code to DNR by Dr. Merton Border. No family present at this time.

## 2017-07-08 DEATH — deceased

## 2017-07-20 IMAGING — CT CT ABDOMEN W/ CM
2 of 6 series · 15 of 46 positions shown, 17 images · IV contrast (APPLIED)
Comparison: 02/03/2016 unenhanced CT abdomen/ pelvis.

CLINICAL DATA: Refractory orthostatic hypotension. Evaluate for
adrenal mass. History of right breast cancer.

EXAM:
CT ABDOMEN WITH CONTRAST
TECHNIQUE: Multidetector CT imaging of the abdomen was performed using the
standard protocol following bolus administration of intravenous
contrast.
CONTRAST:  75mL 4R5FU2-M88 IOPAMIDOL ([id number]) INJECTION 61%

[Series 5: coronal st · coronal · 0.47mm/px · 3 of 68 slices shown]
[im 23/68  soft-tissue]
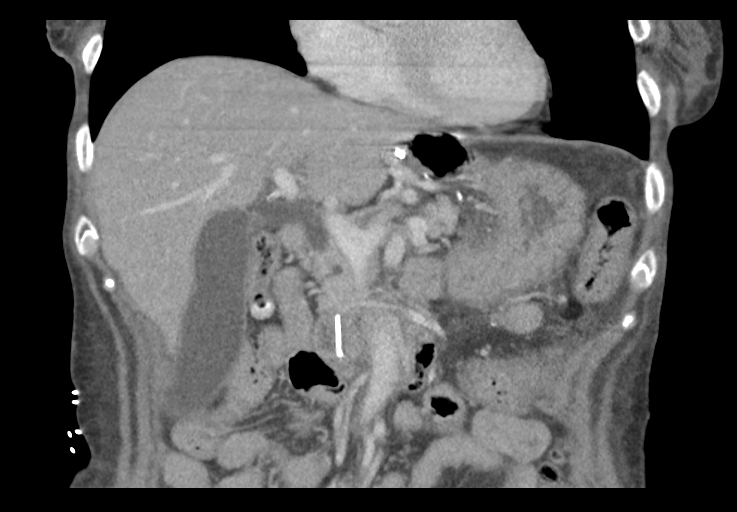
[im 30/68  soft-tissue]
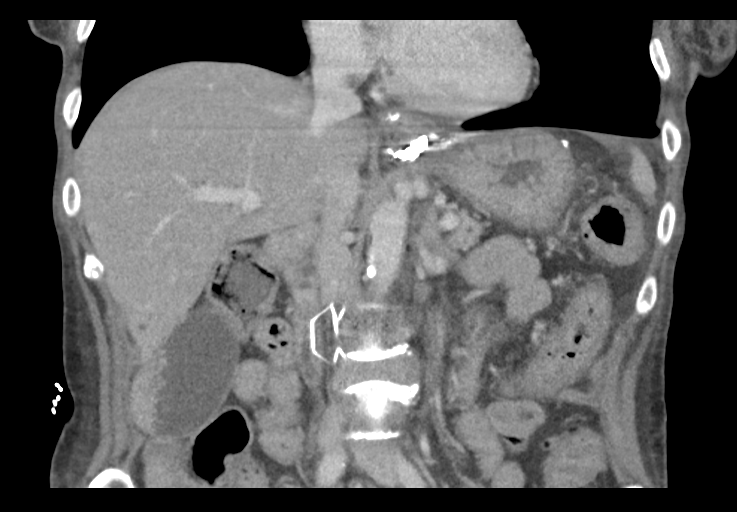
[im 38/68  soft-tissue]
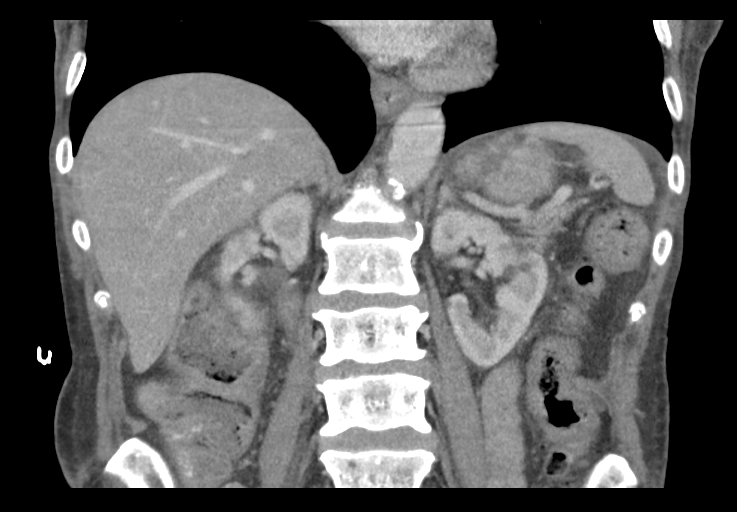

[Series 8: routine abdomen with · axial · 0.73mm/px · z∈[-1024,-834]mm · 12 of 111 slices shown, 14 images]
[im 8/111  soft-tissue]
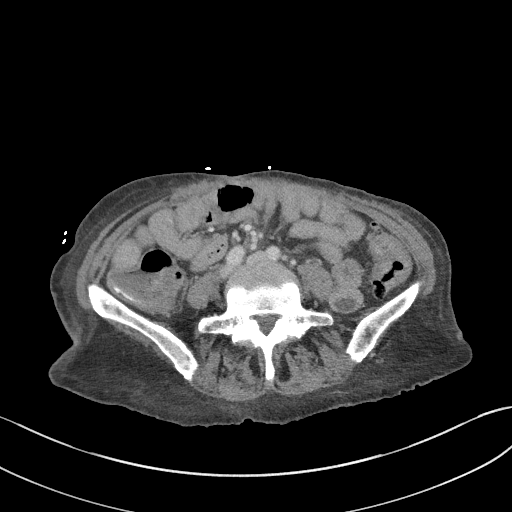
[im 8/111  bone]
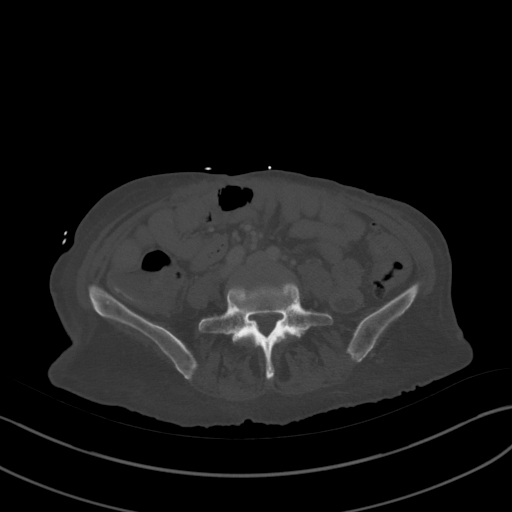
[im 15/111  soft-tissue]
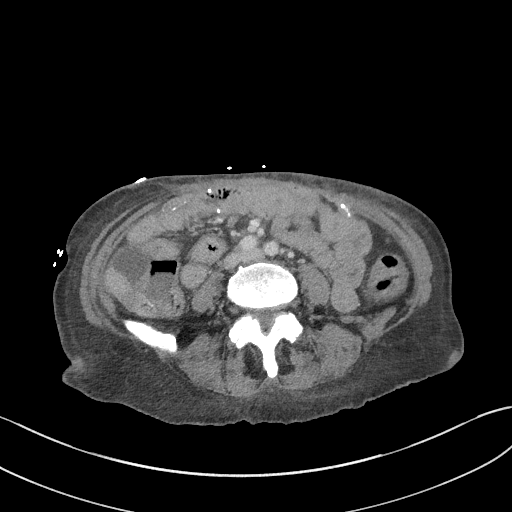
[im 23/111  soft-tissue]
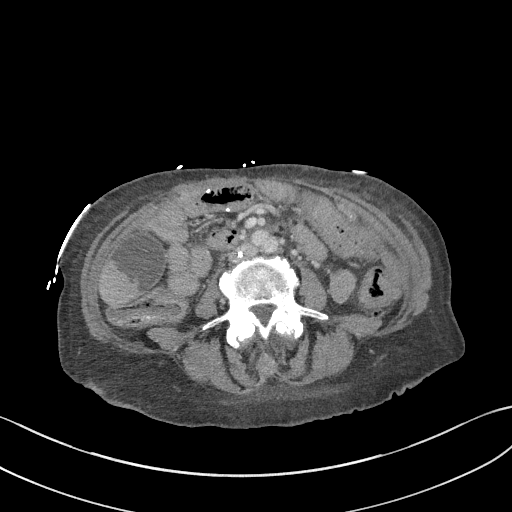
[im 37/111  soft-tissue]
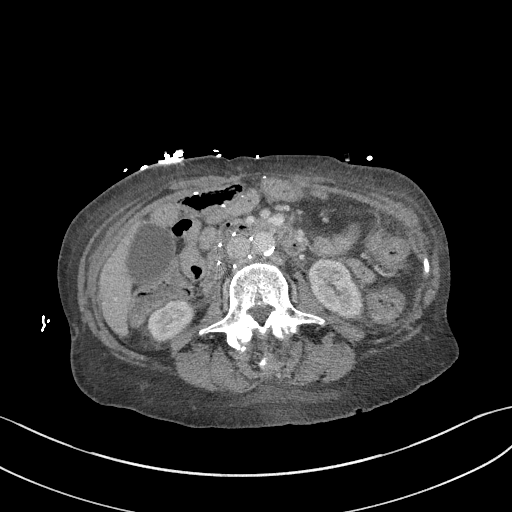
[im 45/111  soft-tissue]
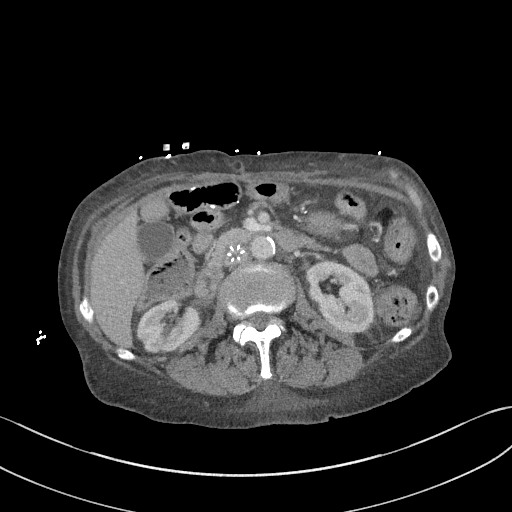
[im 52/111  soft-tissue]
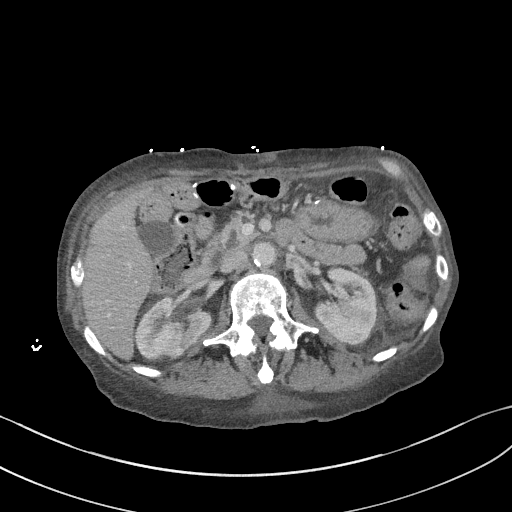
[im 59/111  soft-tissue]
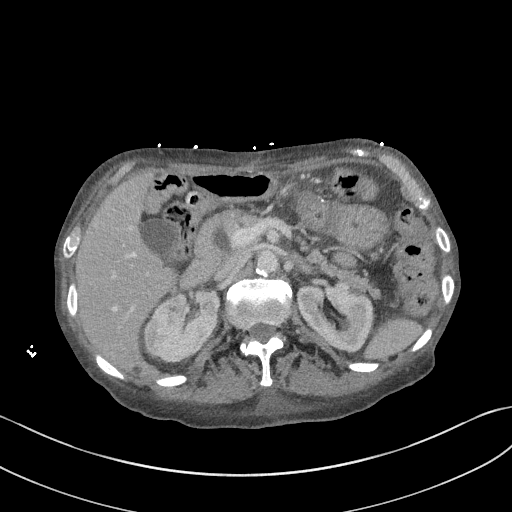
[im 67/111  soft-tissue]
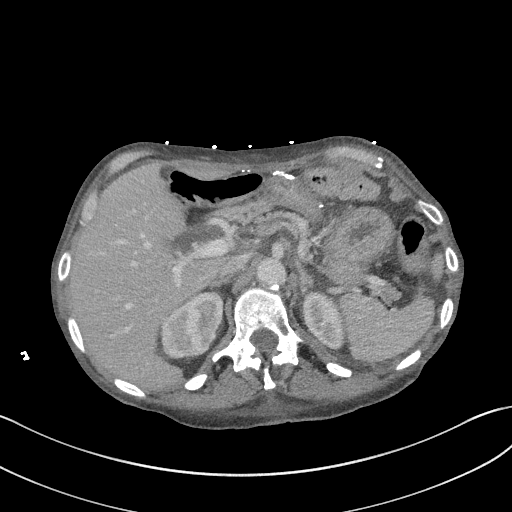
[im 74/111  soft-tissue]
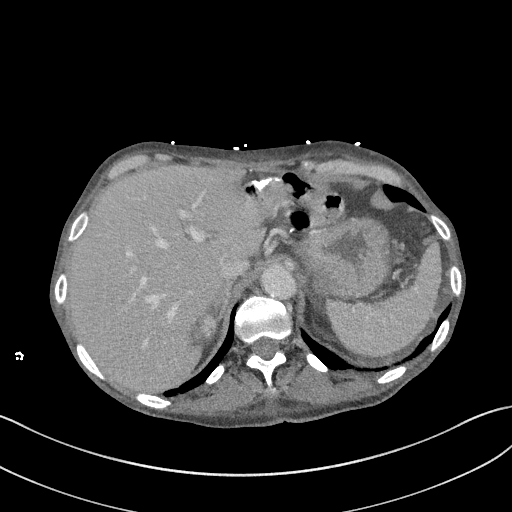
[im 74/111  bone]
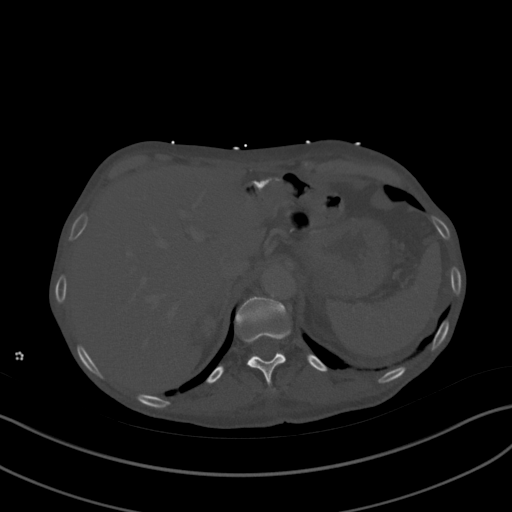
[im 89/111  soft-tissue]
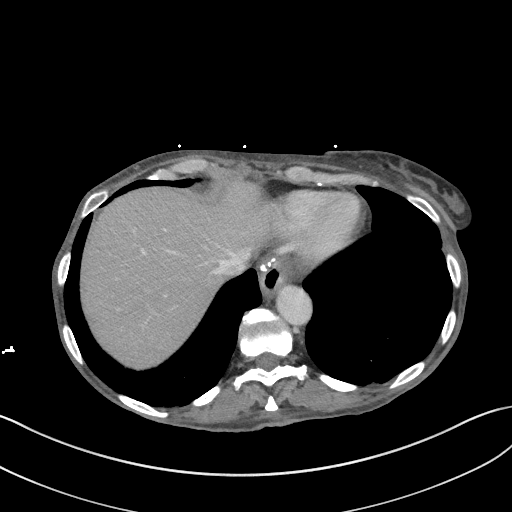
[im 96/111  soft-tissue]
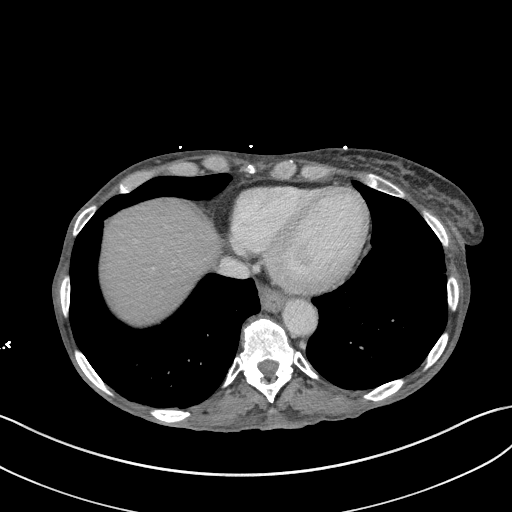
[im 103/111  soft-tissue]
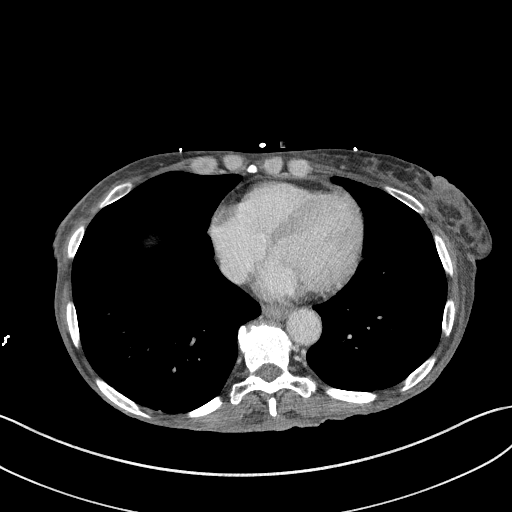

[15 of 46 positions shown; findings below may reference images not displayed]

FINDINGS: Lower chest: No significant pulmonary nodules or acute consolidative
airspace disease.

Hepatobiliary: Normal liver size. Two subcentimeter hypodense
inferior right liver lobe lesions are too small to characterize and
not appreciably changed since 05/23/2015, suggesting benign lesions.
No new liver lesions. Cholelithiasis, with no gallbladder wall
thickening or pericholecystic fluid. No intrahepatic biliary ductal
dilatation. Dilated common bile duct (8 mm diameter, not
convincingly changed back to 05/23/2015 . No radiopaque
choledocholithiasis.

Pancreas: Mild diffuse pancreatic duct dilation, most prominent in
the pancreatic head at 4 mm diameter, not appreciably changed back
to 05/23/2015. No pancreatic mass.

Spleen: Normal size. No mass.

Adrenals/Urinary Tract: Normal adrenal glands, with no discrete
adrenal nodules. No hydronephrosis. Subcentimeter hypodense renal
cortical lesions in both kidneys are too small to characterize and
stable back to 05/23/2015, compatible with benign renal cysts. No
new renal lesions.

Stomach/Bowel: Stable expected postsurgical changes from gastric
bypass surgery with collapsed excluded distal stomach and intact
appearing gastrojejunostomy. Mild diverticulosis in the visualized
distal colon. Intact appearing enteroenterostomy in the right
abdomen. No unexpected bowel dilatation. No bowel wall thickening.

Vascular/Lymphatic: Atherosclerotic nonaneurysmal abdominal aorta.
Patent portal, splenic, hepatic and renal veins. IVC filter is in
place below the level of the renal veins. No pathologically enlarged
lymph nodes in the abdomen.

Other: No pneumoperitoneum, ascites or focal fluid collection.

Musculoskeletal: No aggressive appearing focal osseous lesions. Mild
thoracolumbar spondylosis.
IMPRESSION: 1. Normal adrenal glands with no adrenal nodules.
2. No acute abnormality.
3. Cholelithiasis, with no evidence of acute cholecystitis. Stable
chronic common bile duct dilation without intrahepatic biliary
ductal dilatation. Stable mild diffuse pancreatic duct dilation. No
evidence of an obstructing mass. No radiopaque choledocholithiasis.
Recommend correlation with serum bilirubin levels. MRI abdomen with
MRCP without and with IV contrast may be performed as clinically
warranted.
4. Stable expected postsurgical changes from gastric bypass surgery.
5. Aortic atherosclerosis.
